# Patient Record
Sex: Male | Born: 1970
Health system: Southern US, Community
[De-identification: ages and names within clinical notes are randomized; demographics above are authoritative.]

## PROBLEM LIST (undated history)

## (undated) DIAGNOSIS — R011 Cardiac murmur, unspecified: Secondary | ICD-10-CM

## (undated) DIAGNOSIS — F410 Panic disorder [episodic paroxysmal anxiety] without agoraphobia: Secondary | ICD-10-CM

## (undated) DIAGNOSIS — R51 Headache: Secondary | ICD-10-CM

## (undated) DIAGNOSIS — C801 Malignant (primary) neoplasm, unspecified: Secondary | ICD-10-CM

## (undated) DIAGNOSIS — R109 Unspecified abdominal pain: Secondary | ICD-10-CM

## (undated) DIAGNOSIS — R519 Headache, unspecified: Secondary | ICD-10-CM

## (undated) DIAGNOSIS — K56609 Unspecified intestinal obstruction, unspecified as to partial versus complete obstruction: Secondary | ICD-10-CM

## (undated) HISTORY — PX: FINGER SURGERY: SHX640

## (undated) HISTORY — PX: TYMPANOSTOMY TUBE PLACEMENT: SHX32

---

## 2005-11-05 ENCOUNTER — Emergency Department: Payer: Self-pay | Admitting: Internal Medicine

## 2006-01-03 ENCOUNTER — Other Ambulatory Visit: Payer: Self-pay

## 2006-01-03 ENCOUNTER — Emergency Department: Payer: Self-pay | Admitting: Emergency Medicine

## 2008-11-16 ENCOUNTER — Emergency Department (HOSPITAL_COMMUNITY): Admission: EM | Admit: 2008-11-16 | Discharge: 2008-11-16 | Payer: Self-pay | Admitting: Emergency Medicine

## 2009-10-11 ENCOUNTER — Emergency Department (HOSPITAL_COMMUNITY): Admission: EM | Admit: 2009-10-11 | Discharge: 2009-10-11 | Payer: Self-pay | Admitting: Emergency Medicine

## 2014-11-13 ENCOUNTER — Emergency Department (HOSPITAL_COMMUNITY)
Admission: EM | Admit: 2014-11-13 | Discharge: 2014-11-13 | Disposition: A | Discharge status: Home / Self Care | Payer: Self-pay | Attending: Emergency Medicine | Admitting: Emergency Medicine

## 2014-11-13 ENCOUNTER — Encounter (HOSPITAL_COMMUNITY): Payer: Self-pay | Admitting: Emergency Medicine

## 2014-11-13 DIAGNOSIS — R112 Nausea with vomiting, unspecified: Secondary | ICD-10-CM

## 2014-11-13 DIAGNOSIS — Z720 Tobacco use: Secondary | ICD-10-CM | POA: Insufficient documentation

## 2014-11-13 DIAGNOSIS — R109 Unspecified abdominal pain: Secondary | ICD-10-CM

## 2014-11-13 DIAGNOSIS — K529 Noninfective gastroenteritis and colitis, unspecified: Secondary | ICD-10-CM | POA: Insufficient documentation

## 2014-11-13 DIAGNOSIS — R61 Generalized hyperhidrosis: Secondary | ICD-10-CM | POA: Insufficient documentation

## 2014-11-13 LAB — COMPREHENSIVE METABOLIC PANEL WITH GFR
ALT: 21 U/L (ref 17–63)
AST: 25 U/L (ref 15–41)
Albumin: 4.5 g/dL (ref 3.5–5.0)
Alkaline Phosphatase: 70 U/L (ref 38–126)
Anion gap: 13 (ref 5–15)
BUN: 20 mg/dL (ref 6–20)
CO2: 25 mmol/L (ref 22–32)
Calcium: 9.4 mg/dL (ref 8.9–10.3)
Chloride: 102 mmol/L (ref 101–111)
Creatinine, Ser: 1.1 mg/dL (ref 0.61–1.24)
GFR calc Af Amer: >60 mL/min
GFR calc non Af Amer: >60 mL/min
Glucose, Bld: 105 mg/dL — ABNORMAL HIGH (ref 65–99)
Potassium: 3.7 mmol/L (ref 3.5–5.1)
Sodium: 140 mmol/L (ref 135–145)
Total Bilirubin: 1 mg/dL (ref 0.3–1.2)
Total Protein: 7.4 g/dL (ref 6.5–8.1)

## 2014-11-13 LAB — URINE MICROSCOPIC-ADD ON

## 2014-11-13 LAB — CBC WITH DIFFERENTIAL/PLATELET
Basophils Absolute: 0 K/uL (ref 0.0–0.1)
Basophils Relative: 0 % (ref 0–1)
Eosinophils Absolute: 0.1 K/uL (ref 0.0–0.7)
Eosinophils Relative: 1 % (ref 0–5)
HCT: 52 % (ref 39.0–52.0)
Hemoglobin: 18.2 g/dL — ABNORMAL HIGH (ref 13.0–17.0)
Lymphocytes Relative: 18 % (ref 12–46)
Lymphs Abs: 1.4 K/uL (ref 0.7–4.0)
MCH: 34.1 pg — ABNORMAL HIGH (ref 26.0–34.0)
MCHC: 35 g/dL (ref 30.0–36.0)
MCV: 97.6 fL (ref 78.0–100.0)
Monocytes Absolute: 0.8 K/uL (ref 0.1–1.0)
Monocytes Relative: 10 % (ref 3–12)
Neutro Abs: 5.8 K/uL (ref 1.7–7.7)
Neutrophils Relative %: 71 % (ref 43–77)
Platelets: 267 K/uL (ref 150–400)
RBC: 5.33 MIL/uL (ref 4.22–5.81)
RDW: 13.2 % (ref 11.5–15.5)
WBC: 8.1 K/uL (ref 4.0–10.5)

## 2014-11-13 LAB — URINALYSIS, ROUTINE W REFLEX MICROSCOPIC
Bilirubin Urine: NEGATIVE
GLUCOSE, UA: NEGATIVE mg/dL
HGB URINE DIPSTICK: NEGATIVE
Ketones, ur: NEGATIVE mg/dL
NITRITE: NEGATIVE
PH: 7.5 (ref 5.0–8.0)
PROTEIN: NEGATIVE mg/dL
SPECIFIC GRAVITY, URINE: 1.022 (ref 1.005–1.030)
UROBILINOGEN UA: 1 mg/dL (ref 0.0–1.0)

## 2014-11-13 LAB — TROPONIN I: Troponin I: <0.03 ng/mL (ref <0.031)

## 2014-11-13 LAB — LIPASE, BLOOD: Lipase: 20 U/L — ABNORMAL LOW (ref 22–51)

## 2014-11-13 MED ORDER — GI COCKTAIL ~~LOC~~
30.0000 mL | Freq: Once | ORAL | Status: AC
  Administered 2014-11-13: 30 mL via ORAL
  Filled 2014-11-13: qty 30

## 2014-11-13 MED ORDER — FAMOTIDINE 20 MG PO TABS
20.0000 mg | ORAL_TABLET | Freq: Once | ORAL | Status: AC
  Administered 2014-11-13: 20 mg via ORAL
  Filled 2014-11-13: qty 1

## 2014-11-13 MED ORDER — ONDANSETRON 8 MG PO TBDP
8.0000 mg | ORAL_TABLET | Freq: Once | ORAL | Status: AC
Start: 2014-11-13 — End: 2014-11-13
  Administered 2014-11-13: 8 mg via ORAL
  Filled 2014-11-13: qty 1

## 2014-11-13 NOTE — Discharge Instructions (Signed)
It was our pleasure to provide your ER care today - we hope that you feel better.  Rest. Drink plenty of fluids.  Follow up with primary care doctor, or here, for recheck in the next 1-2 days if symptoms fail to improve/resolve.  Return to ER right away if worse, new symptoms, persistent vomiting, worsening or severe abdominal pain, high fevers, other concern.     Nausea and Vomiting Nausea is a sick feeling that often comes before throwing up (vomiting). Vomiting is a reflex where stomach contents come out of your mouth. Vomiting can cause severe loss of body fluids (dehydration). Children and elderly adults can become dehydrated quickly, especially if they also have diarrhea. Nausea and vomiting are symptoms of a condition or disease. It is important to find the cause of your symptoms. CAUSES   Direct irritation of the stomach lining. This irritation can result from increased acid production (gastroesophageal reflux disease), infection, food poisoning, taking certain medicines (such as nonsteroidal anti-inflammatory drugs), alcohol use, or tobacco use.  Signals from the brain.These signals could be caused by a headache, heat exposure, an inner ear disturbance, increased pressure in the brain from injury, infection, a tumor, or a concussion, pain, emotional stimulus, or metabolic problems.  An obstruction in the gastrointestinal tract (bowel obstruction).  Illnesses such as diabetes, hepatitis, gallbladder problems, appendicitis, kidney problems, cancer, sepsis, atypical symptoms of a heart attack, or eating disorders.  Medical treatments such as chemotherapy and radiation.  Receiving medicine that makes you sleep (general anesthetic) during surgery. DIAGNOSIS Your caregiver may ask for tests to be done if the problems do not improve after a few days. Tests may also be done if symptoms are severe or if the reason for the nausea and vomiting is not clear. Tests may include:  Urine  tests.  Blood tests.  Stool tests.  Cultures (to look for evidence of infection).  X-rays or other imaging studies. Test results can help your caregiver make decisions about treatment or the need for additional tests. TREATMENT You need to stay well hydrated. Drink frequently but in small amounts.You may wish to drink water, sports drinks, clear broth, or eat frozen ice pops or gelatin dessert to help stay hydrated.When you eat, eating slowly may help prevent nausea.There are also some antinausea medicines that may help prevent nausea. HOME CARE INSTRUCTIONS   Take all medicine as directed by your caregiver.  If you do not have an appetite, do not force yourself to eat. However, you must continue to drink fluids.  If you have an appetite, eat a normal diet unless your caregiver tells you differently.  Eat a variety of complex carbohydrates (rice, wheat, potatoes, bread), lean meats, yogurt, fruits, and vegetables.  Avoid high-fat foods because they are more difficult to digest.  Drink enough water and fluids to keep your urine clear or pale yellow.  If you are dehydrated, ask your caregiver for specific rehydration instructions. Signs of dehydration may include:  Severe thirst.  Dry lips and mouth.  Dizziness.  Dark urine.  Decreasing urine frequency and amount.  Confusion.  Rapid breathing or pulse. SEEK IMMEDIATE MEDICAL CARE IF:   You have blood or brown flecks (like coffee grounds) in your vomit.  You have black or bloody stools.  You have a severe headache or stiff neck.  You are confused.  You have severe abdominal pain.  You have chest pain or trouble breathing.  You do not urinate at least once every 8 hours.  You develop  cold or clammy skin.  You continue to vomit for longer than 24 to 48 hours.  You have a fever. MAKE SURE YOU:   Understand these instructions.  Will watch your condition.  Will get help right away if you are not doing  well or get worse. Document Released: 05/07/2005 Document Revised: 07/30/2011 Document Reviewed: 10/04/2010 [facility] Patient Information 2015 [place], [place]. This information is not intended to replace advice given to you by your health care provider. Make sure you discuss any questions you have with your health care provider.      Abdominal Pain Many things can cause abdominal pain. Usually, abdominal pain is not caused by a disease and will improve without treatment. It can often be observed and treated at home. Your health care provider will do a physical exam and possibly order blood tests and X-rays to help determine the seriousness of your pain. However, in many cases, more time must pass before a clear cause of the pain can be found. Before that point, your health care provider may not know if you need more testing or further treatment. HOME CARE INSTRUCTIONS  Monitor your abdominal pain for any changes. The following actions may help to alleviate any discomfort you are experiencing:  Only take over-the-counter or prescription medicines as directed by your health care provider.  Do not take laxatives unless directed to do so by your health care provider.  Try a clear liquid diet (broth, tea, or water) as directed by your health care provider. Slowly move to a bland diet as tolerated. SEEK MEDICAL CARE IF:  You have unexplained abdominal pain.  You have abdominal pain associated with nausea or diarrhea.  You have pain when you urinate or have a bowel movement.  You experience abdominal pain that wakes you in the night.  You have abdominal pain that is worsened or improved by eating food.  You have abdominal pain that is worsened with eating fatty foods.  You have a fever. SEEK IMMEDIATE MEDICAL CARE IF:   Your pain does not go away within 2 hours.  You keep throwing up (vomiting).  Your pain is felt only in portions of the abdomen, such as the right side or the left  lower portion of the abdomen.  You pass bloody or black tarry stools. MAKE SURE YOU:  Understand these instructions.   Will watch your condition.   Will get help right away if you are not doing well or get worse.  Document Released: 02/14/2005 Document Revised: 05/12/2013 Document Reviewed: 01/14/2013 [facility] Patient Information 2015 [place], [place]. This information is not intended to replace advice given to you by your health care provider. Make sure you discuss any questions you have with your health care provider.    Viral Gastroenteritis Viral gastroenteritis is also known as stomach flu. This condition affects the stomach and intestinal tract. It can cause sudden diarrhea and vomiting. The illness typically lasts 3 to 8 days. Most people develop an immune response that eventually gets rid of the virus. While this natural response develops, the virus can make you quite ill. CAUSES  Many different viruses can cause gastroenteritis, such as rotavirus or noroviruses. You can catch one of these viruses by consuming contaminated food or water. You may also catch a virus by sharing utensils or other personal items with an infected person or by touching a contaminated surface. SYMPTOMS  The most common symptoms are diarrhea and vomiting. These problems can cause a severe loss of body fluids (dehydration)  and a body salt (electrolyte) imbalance. Other symptoms may include:  Fever.  Headache.  Fatigue.  Abdominal pain. DIAGNOSIS  Your caregiver can usually diagnose viral gastroenteritis based on your symptoms and a physical exam. A stool sample may also be taken to test for the presence of viruses or other infections. TREATMENT  This illness typically goes away on its own. Treatments are aimed at rehydration. The most serious cases of viral gastroenteritis involve vomiting so severely that you are not able to keep fluids down. In these cases, fluids must be given through an  intravenous line (IV). HOME CARE INSTRUCTIONS   Drink enough fluids to keep your urine clear or pale yellow. Drink small amounts of fluids frequently and increase the amounts as tolerated.  Ask your caregiver for specific rehydration instructions.  Avoid:  Foods high in sugar.  Alcohol.  Carbonated drinks.  Tobacco.  Juice.  Caffeine drinks.  Extremely hot or cold fluids.  Fatty, greasy foods.  Too much intake of anything at one time.  Dairy products until 24 to 48 hours after diarrhea stops.  You may consume probiotics. Probiotics are active cultures of beneficial bacteria. They may lessen the amount and number of diarrheal stools in adults. Probiotics can be found in yogurt with active cultures and in supplements.  Wash your hands well to avoid spreading the virus.  Only take over-the-counter or prescription medicines for pain, discomfort, or fever as directed by your caregiver. Do not give aspirin to children. Antidiarrheal medicines are not recommended.  Ask your caregiver if you should continue to take your regular prescribed and over-the-counter medicines.  Keep all follow-up appointments as directed by your caregiver. SEEK IMMEDIATE MEDICAL CARE IF:   You are unable to keep fluids down.  You do not urinate at least once every 6 to 8 hours.  You develop shortness of breath.  You notice blood in your stool or vomit. This may look like coffee grounds.  You have abdominal pain that increases or is concentrated in one small area (localized).  You have persistent vomiting or diarrhea.  You have a fever.  The patient is a child younger than 3 months, and he or she has a fever.  The patient is a child older than 3 months, and he or she has a fever and persistent symptoms.  The patient is a child older than 3 months, and he or she has a fever and symptoms suddenly get worse.  The patient is a baby, and he or she has no tears when crying. MAKE SURE YOU:    Understand these instructions.  Will watch your condition.  Will get help right away if you are not doing well or get worse. Document Released: 05/07/2005 Document Revised: 07/30/2011 Document Reviewed: 02/21/2011 [facility] Patient Information 2015 [place], [place]. This information is not intended to replace advice given to you by your health care provider. Make sure you discuss any questions you have with your health care provider.

## 2014-11-13 NOTE — ED Notes (Signed)
Per EMS pt from home had catfish last night and again at 0600 today starting vomiting 1 hour ago and has vomited x 5 times with abdominal pain. Pain and tightness to abdomen. BP 130/60, HR 74, resp 20 and 98% O2.

## 2014-11-13 NOTE — ED Notes (Signed)
Pt is refusing iv at this moment.  Will follow up.

## 2014-11-13 NOTE — ED Provider Notes (Signed)
CSN: [id number]     Arrival date & time 11/13/14  1433 History   First MD Initiated Contact with Patient 11/13/14 1516     Chief Complaint  Patient presents with  . Abdominal Pain  . Nausea  . Emesis     (Consider location/radiation/quality/duration/timing/severity/associated sxs/prior Treatment) Patient is a 44 y.o. male presenting with abdominal pain and vomiting. The history is provided by the patient.  Abdominal Pain Associated symptoms: vomiting   Associated symptoms: no chest pain, no chills, no constipation, no cough, no diarrhea, no dysuria, no fever, no shortness of breath and no sore throat   Emesis Associated symptoms: abdominal pain   Associated symptoms: no chills, no diarrhea, no headaches and no sore throat   Patient c/o abd bloating, crampy discomfort, and nv onset a few hours ago. Pt indicates had eaten fried catfish late last night/this AM, and feels may be related. States entire abd felt bloating and then crampy pain moved up to epigastric area. Vomited 4-5 times, not bloody or bilious. Did have normal bm today. No prior abd surgery or hx chronic gi problems. No specific food intolerance. Denies back or flank pain. No g/u c/o. No fever or chills. Denies cp or discomfort. No recent unusual doe or fatigue. w above symptoms, did note diaphoresis. No hx pud, gallstones or pancreatitis.      History reviewed. No pertinent past medical history. History reviewed. No pertinent past surgical history. No family history on file. History  Substance Use Topics  . Smoking status: Current Every Day Smoker  . Smokeless tobacco: Not on file  . Alcohol Use: Yes    Review of Systems  Constitutional: Positive for diaphoresis. Negative for fever and chills.  HENT: Negative for sore throat.   Eyes: Negative for redness.  Respiratory: Negative for cough and shortness of breath.   Cardiovascular: Negative for chest pain, palpitations and leg swelling.  Gastrointestinal: Positive  for vomiting and abdominal pain. Negative for diarrhea and constipation.  Endocrine: Negative for polyuria.  Genitourinary: Negative for dysuria and flank pain.  Musculoskeletal: Negative for back pain and neck pain.  Skin: Negative for rash.  Neurological: Negative for headaches.  Hematological: Does not bruise/bleed easily.  Psychiatric/Behavioral: Negative for confusion.      Allergies  Asa  Home Medications   Prior to Admission medications   Not on File   There were no vitals taken for this visit. Physical Exam  Constitutional: He is oriented to person, place, and time. He appears well-developed and well-nourished. No distress.  HENT:  Mouth/Throat: Oropharynx is clear and moist.  Eyes: Conjunctivae are normal. No scleral icterus.  Neck: Neck supple. No tracheal deviation present. No thyromegaly present.  Cardiovascular: Normal rate, regular rhythm, normal heart sounds and intact distal pulses.  Exam reveals no gallop and no friction rub.   No murmur heard. Pulmonary/Chest: Effort normal and breath sounds normal. No accessory muscle usage. No respiratory distress.  Abdominal: Soft. Bowel sounds are normal. He exhibits no distension and no mass. There is no tenderness. There is no rebound and no guarding.  No incarc hernia.   Genitourinary:  No scrotal or testicular pain or swelling. No cva tenderness.   Musculoskeletal: Normal range of motion.  Neurological: He is alert and oriented to person, place, and time.  Skin: Skin is warm and dry. No rash noted. He is not diaphoretic.  Psychiatric: He has a normal mood and affect.  Nursing note and vitals reviewed.   ED Course  Procedures (including  critical care time) Labs Review  Results for orders placed or performed during the hospital encounter of 11/13/14  CBC with Differential  Result Value Ref Range   WBC 8.1 4.0 - 10.5 K/uL   RBC 5.33 4.22 - 5.81 MIL/uL   Hemoglobin 18.2 (H) 13.0 - 17.0 g/dL   HCT 52.0 39.0 -  52.0 %   MCV 97.6 78.0 - 100.0 fL   MCH 34.1 (H) 26.0 - 34.0 pg   MCHC 35.0 30.0 - 36.0 g/dL   RDW 13.2 11.5 - 15.5 %   Platelets 267 150 - 400 K/uL   Neutrophils Relative % 71 43 - 77 %   Neutro Abs 5.8 1.7 - 7.7 K/uL   Lymphocytes Relative 18 12 - 46 %   Lymphs Abs 1.4 0.7 - 4.0 K/uL   Monocytes Relative 10 3 - 12 %   Monocytes Absolute 0.8 0.1 - 1.0 K/uL   Eosinophils Relative 1 0 - 5 %   Eosinophils Absolute 0.1 0.0 - 0.7 K/uL   Basophils Relative 0 0 - 1 %   Basophils Absolute 0.0 0.0 - 0.1 K/uL  Comprehensive metabolic panel  Result Value Ref Range   Sodium 140 135 - 145 mmol/L   Potassium 3.7 3.5 - 5.1 mmol/L   Chloride 102 101 - 111 mmol/L   CO2 25 22 - 32 mmol/L   Glucose, Bld 105 (H) 65 - 99 mg/dL   BUN 20 6 - 20 mg/dL   Creatinine, Ser 1.10 0.61 - 1.24 mg/dL   Calcium 9.4 8.9 - 10.3 mg/dL   Total Protein 7.4 6.5 - 8.1 g/dL   Albumin 4.5 3.5 - 5.0 g/dL   AST 25 15 - 41 U/L   ALT 21 17 - 63 U/L   Alkaline Phosphatase 70 38 - 126 U/L   Total Bilirubin 1.0 0.3 - 1.2 mg/dL   GFR calc non Af Amer >60 >60 mL/min   GFR calc Af Amer >60 >60 mL/min   Anion gap 13 5 - 15  Lipase, blood  Result Value Ref Range   Lipase 20 (L) 22 - 51 U/L  Urinalysis, Routine w reflex microscopic (not at [facility])  Result Value Ref Range   Color, Urine YELLOW YELLOW   APPearance CLEAR (A) CLEAR   Specific Gravity, Urine 1.022 1.005 - 1.030   pH 7.5 5.0 - 8.0   Glucose, UA NEGATIVE NEGATIVE mg/dL   Hgb urine dipstick NEGATIVE NEGATIVE   Bilirubin Urine NEGATIVE NEGATIVE   Ketones, ur NEGATIVE NEGATIVE mg/dL   Protein, ur NEGATIVE NEGATIVE mg/dL   Urobilinogen, UA 1.0 0.0 - 1.0 mg/dL   Nitrite NEGATIVE NEGATIVE   Leukocytes, UA TRACE (A) NEGATIVE  Troponin I  Result Value Ref Range   Troponin I <0.03 <0.031 ng/mL  Urine microscopic-add on  Result Value Ref Range   Squamous Epithelial / LPF RARE RARE   WBC, UA 0-2 <3 WBC/hpf       EKG Interpretation   Date/Time:  Saturday November 13 2014 16:20:26 EDT Ventricular Rate:  66 PR Interval:  197 QRS Duration: 82 QT Interval:  395 QTC Calculation: 414 R Axis:   68 Text Interpretation:  Sinus rhythm ST elev, probable normal early repol  pattern No significant change since last tracing Confirmed by Ashok Cordia  MD,  Lennette Bihari ([id number]) on 11/13/2014 4:24:51 PM      MDM   Iv ns. Labs.  pepcid and gi cocktail for symptom relief.  Reviewed nursing notes and prior charts for  additional history.   Recheck symptoms resolved. No nvd. Tolerating po fluids. No cp or discomfort.   Pt requests d/c to home, states asymptomatic. abd soft nt.  Pt currently appears stable for d/c.   Return precautions provided.      Lajean Saver, MD 11/13/14 [phone]

## 2016-05-21 DEATH — deceased

## 2017-01-19 ENCOUNTER — Encounter (HOSPITAL_COMMUNITY): Payer: Self-pay | Admitting: Nurse Practitioner

## 2017-01-19 ENCOUNTER — Emergency Department (HOSPITAL_COMMUNITY): Payer: BLUE CROSS/BLUE SHIELD

## 2017-01-19 ENCOUNTER — Emergency Department (HOSPITAL_COMMUNITY)
Admission: EM | Admit: 2017-01-19 | Discharge: 2017-01-19 | Disposition: A | Discharge status: Home / Self Care | Payer: BLUE CROSS/BLUE SHIELD | Attending: Emergency Medicine | Admitting: Emergency Medicine

## 2017-01-19 DIAGNOSIS — R1030 Lower abdominal pain, unspecified: Secondary | ICD-10-CM | POA: Diagnosis present

## 2017-01-19 DIAGNOSIS — K5000 Crohn's disease of small intestine without complications: Secondary | ICD-10-CM | POA: Insufficient documentation

## 2017-01-19 DIAGNOSIS — F17200 Nicotine dependence, unspecified, uncomplicated: Secondary | ICD-10-CM | POA: Insufficient documentation

## 2017-01-19 LAB — TROPONIN I: Troponin I: <0.03 ng/mL (ref <0.03)

## 2017-01-19 LAB — CBC WITH DIFFERENTIAL/PLATELET
Basophils Absolute: 0 10*3/uL (ref 0.0–0.1)
Basophils Relative: 0 %
Eosinophils Absolute: 0 10*3/uL (ref 0.0–0.7)
Eosinophils Relative: 0 %
HCT: 47.6 % (ref 39.0–52.0)
HEMOGLOBIN: 16.9 g/dL (ref 13.0–17.0)
LYMPHS ABS: 1 10*3/uL (ref 0.7–4.0)
LYMPHS PCT: 6 %
MCH: 32.9 pg (ref 26.0–34.0)
MCHC: 35.5 g/dL (ref 30.0–36.0)
MCV: 92.8 fL (ref 78.0–100.0)
MONOS PCT: 10 %
Monocytes Absolute: 1.6 10*3/uL — ABNORMAL HIGH (ref 0.1–1.0)
Neutro Abs: 13.3 10*3/uL — ABNORMAL HIGH (ref 1.7–7.7)
Neutrophils Relative %: 84 %
Platelets: 307 10*3/uL (ref 150–400)
RBC: 5.13 MIL/uL (ref 4.22–5.81)
RDW: 12.3 % (ref 11.5–15.5)
WBC: 16 10*3/uL — AB (ref 4.0–10.5)

## 2017-01-19 LAB — COMPREHENSIVE METABOLIC PANEL
ALT: 14 U/L — AB (ref 17–63)
ANION GAP: 14 (ref 5–15)
AST: 19 U/L (ref 15–41)
Albumin: 3.6 g/dL (ref 3.5–5.0)
Alkaline Phosphatase: 79 U/L (ref 38–126)
BUN: 15 mg/dL (ref 6–20)
CHLORIDE: 98 mmol/L — AB (ref 101–111)
CO2: 27 mmol/L (ref 22–32)
CREATININE: 0.99 mg/dL (ref 0.61–1.24)
Calcium: 9.6 mg/dL (ref 8.9–10.3)
Glucose, Bld: 103 mg/dL — ABNORMAL HIGH (ref 65–99)
POTASSIUM: 4.4 mmol/L (ref 3.5–5.1)
SODIUM: 139 mmol/L (ref 135–145)
Total Bilirubin: 2.1 mg/dL — ABNORMAL HIGH (ref 0.3–1.2)
Total Protein: 8 g/dL (ref 6.5–8.1)

## 2017-01-19 LAB — LIPASE, BLOOD: LIPASE: 19 U/L (ref 11–51)

## 2017-01-19 MED ORDER — IOPAMIDOL (ISOVUE-300) INJECTION 61%
INTRAVENOUS | Status: AC
  Administered 2017-01-19: 100 mL
  Filled 2017-01-19: qty 100

## 2017-01-19 MED ORDER — CIPROFLOXACIN IN D5W 400 MG/200ML IV SOLN
400.0000 mg | Freq: Once | INTRAVENOUS | Status: AC
  Administered 2017-01-19: 400 mg via INTRAVENOUS
  Filled 2017-01-19: qty 200

## 2017-01-19 MED ORDER — SODIUM CHLORIDE 0.9 % IV BOLUS (SEPSIS)
1000.0000 mL | Freq: Once | INTRAVENOUS | Status: AC
  Administered 2017-01-19: 1000 mL via INTRAVENOUS

## 2017-01-19 MED ORDER — HYDROCODONE-ACETAMINOPHEN 5-325 MG PO TABS: 1.0000 | ORAL_TABLET | ORAL | 0 refills | Status: DC | PRN

## 2017-01-19 MED ORDER — METRONIDAZOLE IN NACL 5-0.79 MG/ML-% IV SOLN
500.0000 mg | Freq: Once | INTRAVENOUS | Status: AC
  Administered 2017-01-19: 500 mg via INTRAVENOUS
  Filled 2017-01-19: qty 100

## 2017-01-19 MED ORDER — HYDROCODONE-ACETAMINOPHEN 5-325 MG PO TABS
2.0000 | ORAL_TABLET | Freq: Once | ORAL | Status: AC
  Administered 2017-01-19: 2 via ORAL
  Filled 2017-01-19: qty 2

## 2017-01-19 MED ORDER — ONDANSETRON 4 MG PO TBDP: 4.0000 mg | ORAL_TABLET | Freq: Three times a day (TID) | ORAL | 0 refills | Status: DC | PRN

## 2017-01-19 MED ORDER — METRONIDAZOLE 500 MG PO TABS: 500.0000 mg | ORAL_TABLET | Freq: Two times a day (BID) | ORAL | 0 refills | Status: DC

## 2017-01-19 MED ORDER — MORPHINE SULFATE (PF) 4 MG/ML IV SOLN
8.0000 mg | Freq: Once | INTRAVENOUS | Status: AC
  Administered 2017-01-19: 8 mg via INTRAVENOUS
  Filled 2017-01-19: qty 2

## 2017-01-19 MED ORDER — CIPROFLOXACIN HCL 250 MG PO TABS: 250.0000 mg | ORAL_TABLET | Freq: Two times a day (BID) | ORAL | 0 refills | Status: DC

## 2017-01-19 NOTE — Discharge Instructions (Signed)
Return to the ER immediately if you develop fever, vomiting, worsening or continuing abdominal pain. Otherwise follow up with a primary care doctor and GI specialist.

## 2017-01-19 NOTE — ED Notes (Signed)
Pt unable to provide urine specimen. Urinal by bedside.

## 2017-01-19 NOTE — ED Notes (Signed)
Patient has urinal at bedside not able to provide speciman at this time

## 2017-01-19 NOTE — ED Notes (Signed)
Bed: YW03 Expected date: 01/19/17 Expected time: 5:17 PM Means of arrival: Ambulance Comments: N/V

## 2017-01-19 NOTE — ED Notes (Signed)
Patient transported to CT 

## 2017-01-19 NOTE — ED Provider Notes (Signed)
[place] DEPT Provider Note   CSN: [id number] Arrival date & time: 01/19/17  1709     History   Chief Complaint Chief Complaint  Patient presents with  . Abdominal Pain    HPI Derek Lloyd is a 46 y.o. male.  HPI  45 year old male presents with abdominal pain that started 2 days ago. Has been having vomiting since yesterday. He has not had a bowel movement since his abdominal pain started. He states his abdomen feels stented but when he looks at it is not distended. No hematemesis. He has tried Rolaids without relief as well as Pepto-Bismol, which caused him to vomit. He tried magnesium citrate because he has had no bowel movement in about 30 minutes later the pain intensified and so he called EMS. He states that he typically has a lot of flatulence and he's had very minimal since this pain started. He has gotten some pain at the bottom of his scrotum and he urinates during this time. The pain sometimes radiates up into his chest. The lower abdomen is where it hurts the worse and feels like a burning sensation. Over the past few hours he has felt short of breath. Pain is severe.  History reviewed. No pertinent past medical history.  There are no active problems to display for this patient.   History reviewed. No pertinent surgical history.     Home Medications    Prior to Admission medications   Medication Sig Start Date End Date Taking? Authorizing Provider  Ca Carbonate-Mag Hydroxide 550-110 MG CHEW Chew 1 tablet by mouth daily as needed (GI Upset).   Yes [provider]  magnesium citrate ([employer] CITRATE OF MAGNESIA) SOLN Take 1 Bottle by mouth once.   Yes [provider]  ciprofloxacin (CIPRO) 250 MG tablet Take 1 tablet (250 mg total) by mouth every 12 (twelve) hours. 01/19/17   Sherwood Gambler, MD  HYDROcodone-acetaminophen (NORCO) 5-325 MG tablet Take 1-2 tablets by mouth every 4 (four) hours as needed for severe pain. 01/19/17   Sherwood Gambler, MD    metroNIDAZOLE (FLAGYL) 500 MG tablet Take 1 tablet (500 mg total) by mouth 2 (two) times daily. One po bid x 7 days 01/19/17   Sherwood Gambler, MD  ondansetron (ZOFRAN ODT) 4 MG disintegrating tablet Take 1 tablet (4 mg total) by mouth every 8 (eight) hours as needed for nausea or vomiting. 01/19/17   Sherwood Gambler, MD    Family History No family history on file.  Social History Social History  Substance Use Topics  . Smoking status: Current Every Day Smoker  . Smokeless tobacco: Not on file  . Alcohol use Yes     Allergies   Asa [aspirin]   Review of Systems Review of Systems  Constitutional: Negative for fever.  Respiratory: Positive for shortness of breath.   Cardiovascular: Positive for chest pain.  Gastrointestinal: Positive for abdominal pain, constipation, nausea and vomiting. Negative for diarrhea.  Genitourinary: Positive for dysuria.  All other systems reviewed and are negative.    Physical Exam Updated Vital Signs BP 118/81   Pulse (!) 102   Temp 99.3 F (37.4 C) (Oral)   Resp 19   Ht 5' 10"  (1.778 m)   Wt 61.2 kg (135 lb)   SpO2 98%   BMI 19.37 kg/m   Physical Exam  Constitutional: He is oriented to person, place, and time. He appears well-developed and well-nourished.  HENT:  Head: Normocephalic and atraumatic.  Right Ear: External ear normal.  Left Ear:  External ear normal.  Nose: Nose normal.  Eyes: Right eye exhibits no discharge. Left eye exhibits no discharge.  Neck: Neck supple.  Cardiovascular: Normal rate, regular rhythm and normal heart sounds.   HR~100  Pulmonary/Chest: Effort normal and breath sounds normal.  Abdominal: Soft. There is generalized tenderness. There is guarding.  Genitourinary: Penis normal. Right testis shows no tenderness. Left testis shows no tenderness. Circumcised.  Genitourinary Comments: Point tenderness at inferior aspect of his scrotum but his testicles are not swollen or tender. No skin changes or swelling to  scrotum.  Musculoskeletal: He exhibits no edema.  Neurological: He is alert and oriented to person, place, and time.  Skin: Skin is warm and dry. He is not diaphoretic.  Nursing note and vitals reviewed.    ED Treatments / Results  Labs (all labs ordered are listed, but only abnormal results are displayed) Labs Reviewed  COMPREHENSIVE METABOLIC PANEL - Abnormal; Notable for the following:       Result Value   Chloride 98 (*)    Glucose, Bld 103 (*)    ALT 14 (*)    Total Bilirubin 2.1 (*)    All other components within normal limits  CBC WITH DIFFERENTIAL/PLATELET - Abnormal; Notable for the following:    WBC 16.0 (*)    Neutro Abs 13.3 (*)    Monocytes Absolute 1.6 (*)    All other components within normal limits  LIPASE, BLOOD  TROPONIN I  URINALYSIS, ROUTINE W REFLEX MICROSCOPIC    EKG  EKG Interpretation  Date/Time:  Saturday January 19 2017 18:23:48 EDT Ventricular Rate:  102 PR Interval:    QRS Duration: 81 QT Interval:  330 QTC Calculation: 430 R Axis:   68 Text Interpretation:  Sinus tachycardia RAE, consider biatrial enlargement ST elev, probable normal early repol pattern rate is faster, otherwise no significant change compared to 2016 Confirmed by Sherwood Gambler [phone]) on 01/19/2017 6:55:19 PM       Radiology Dg Chest 2 View  Result Date: 01/19/2017 CLINICAL DATA:  Upper abdomen pressure EXAM: CHEST  2 VIEW COMPARISON:  January 03, 2006 FINDINGS: The heart size and mediastinal contours are within normal limits. Both lungs are clear. The visualized skeletal structures are unremarkable. IMPRESSION: No active cardiopulmonary disease. Electronically Signed   By: Abelardo Diesel M.D.   On: 01/19/2017 17:54   Ct Abdomen Pelvis W Contrast  Result Date: 01/19/2017 CLINICAL DATA:  Abdominal pain accompanied with nausea, vomiting and constipation.No acute abnormality. EXAM: CT ABDOMEN AND PELVIS WITH CONTRAST TECHNIQUE: Multidetector CT imaging of the abdomen and  pelvis was performed using the standard protocol following bolus administration of intravenous contrast. CONTRAST:  100 cc Isovue-300 COMPARISON:  None. FINDINGS: Lower chest: No acute abnormality. Normal size heart. No pericardial effusion. Bibasilar atelectasis. Hepatobiliary: No focal liver abnormality is seen. No gallstones, gallbladder wall thickening, or biliary dilatation. Pancreas: Unremarkable. No pancreatic ductal dilatation or surrounding inflammatory changes. Spleen: Normal in size without focal abnormality. Adrenals/Urinary Tract: Adrenal glands are unremarkable. Kidneys are normal, without renal calculi, focal lesion, or hydronephrosis. Bladder is unremarkable. Stomach/Bowel: Moderate transmural thickening of distal and terminal ileum with more proximal fluid-filled distention of jejunal and ileal loops of small intestine. Diarrheal disease suspected within the colon with liquid stool present to the level of the rectum. Abnormal thickening and distention of the appendix to 13 mm in diameter is also noted. Differential possibilities may include sympathetic thickening of the appendix due to adjacent ileitis although a concomitant appendicitis cannot entirely  excluded or sympathetic thickening of the ileum due to an appendicitis. Vascular/Lymphatic: Aortoiliac atherosclerosis. No lymphadenopathy by CT size criteria. Reproductive: Central zone calcifications are noted within prostate which is mildly enlarged. Partially imaged small hydroceles within the included scrotum. Other: No abdominal wall hernia or abnormality. No abdominopelvic ascites. Musculoskeletal: No acute or significant osseous findings. IMPRESSION: 1. Segmental moderate mural thickening of distal and terminal ileum consistent with an ileitis. No bowel obstruction is noted. More proximal fluid-filled distention of nonthickened jejunum and proximal ileum are identified which may reflect an enteritis. Liquid stool is seen within the colon  consistent with diarrheal disease. 2. Confounding findings of a distended thickened appendix, measuring up to 12 mm also in the right lower quadrant adjacent to the inflamed distal ileum. Findings could be due to sympathetic thickening of the appendix due to adjacent ileitis or vice versa, favor an ileitis with sympathetic thickening of the appendix given the more segmental moderate thickened appearance of the ileum. 3. Small hydroceles are partially imaged of the included scrotum. Electronically Signed   By: Ashley Royalty M.D.   On: 01/19/2017 20:45    Procedures Procedures (including critical care time)  Medications Ordered in ED Medications  sodium chloride 0.9 % bolus 1,000 mL (0 mLs Intravenous Stopped 01/19/17 1903)  morphine 4 MG/ML injection 8 mg (8 mg Intravenous Given 01/19/17 1803)  iopamidol (ISOVUE-300) 61 % injection (100 mLs  Contrast Given 01/19/17 1939)  ciprofloxacin (CIPRO) IVPB 400 mg (0 mg Intravenous Stopped 01/19/17 2249)  metroNIDAZOLE (FLAGYL) IVPB 500 mg (0 mg Intravenous Stopped 01/19/17 2249)  sodium chloride 0.9 % bolus 1,000 mL (0 mLs Intravenous Stopped 01/19/17 2249)  HYDROcodone-acetaminophen (NORCO/VICODIN) 5-325 MG per tablet 2 tablet (2 tablets Oral Given 01/19/17 2148)     Initial Impression / Assessment and Plan / ED Course  I have reviewed the triage vital signs and the nursing notes.  Pertinent labs & imaging results that were available during my care of the patient were reviewed by me and considered in my medical decision making (see chart for details).     CT shows ileitis. Pain is much better. HR improved. I don't think this represents appendicitis based on CT findings. No abscess, perforation. I recommended overnight obs with fluids and antibiotics. He declines, states he has to go back to his motel or he will lose his stuff. Given he appears well now, I think this is OK, and will d/c with antibiotics and pain control. No vomiting in ED. However I also discussed  strict return precautions and he understands. No clear cause for his mild scrotum pain, no swelling or infection.   Final Clinical Impressions(s) / ED Diagnoses   Final diagnoses:  Terminal ileitis without complication [facility])    New Prescriptions Discharge Medication List as of 01/19/2017 10:26 PM    START taking these medications   Details  ciprofloxacin (CIPRO) 250 MG tablet Take 1 tablet (250 mg total) by mouth every 12 (twelve) hours., Starting Sat 01/19/2017, Print    HYDROcodone-acetaminophen (NORCO) 5-325 MG tablet Take 1-2 tablets by mouth every 4 (four) hours as needed for severe pain., Starting Sat 01/19/2017, Print    metroNIDAZOLE (FLAGYL) 500 MG tablet Take 1 tablet (500 mg total) by mouth 2 (two) times daily. One po bid x 7 days, Starting Sat 01/19/2017, Print    ondansetron (ZOFRAN ODT) 4 MG disintegrating tablet Take 1 tablet (4 mg total) by mouth every 8 (eight) hours as needed for nausea or vomiting., Starting Sat 01/19/2017,  Print         Sherwood Gambler, MD 01/20/17 [phone]

## 2017-01-19 NOTE — ED Triage Notes (Signed)
Pt is c/o abdominal pain accompanied by N/V/Constipation. Reports taking OTC stool softer without getting relief.

## 2017-01-24 ENCOUNTER — Other Ambulatory Visit: Payer: Self-pay | Admitting: General Surgery

## 2017-01-24 ENCOUNTER — Emergency Department (HOSPITAL_COMMUNITY): Payer: BLUE CROSS/BLUE SHIELD

## 2017-01-24 ENCOUNTER — Emergency Department (HOSPITAL_COMMUNITY)
Admission: EM | Admit: 2017-01-24 | Discharge: 2017-01-24 | Discharge status: Against Medical Advice | Payer: BLUE CROSS/BLUE SHIELD | Attending: Emergency Medicine | Admitting: Emergency Medicine

## 2017-01-24 ENCOUNTER — Encounter (HOSPITAL_COMMUNITY): Payer: Self-pay | Admitting: Emergency Medicine

## 2017-01-24 DIAGNOSIS — Z79899 Other long term (current) drug therapy: Secondary | ICD-10-CM | POA: Diagnosis not present

## 2017-01-24 DIAGNOSIS — R1031 Right lower quadrant pain: Secondary | ICD-10-CM | POA: Insufficient documentation

## 2017-01-24 DIAGNOSIS — R1030 Lower abdominal pain, unspecified: Secondary | ICD-10-CM

## 2017-01-24 DIAGNOSIS — F17200 Nicotine dependence, unspecified, uncomplicated: Secondary | ICD-10-CM | POA: Diagnosis not present

## 2017-01-24 LAB — URINALYSIS, ROUTINE W REFLEX MICROSCOPIC
Bacteria, UA: NONE SEEN
Bilirubin Urine: NEGATIVE
Glucose, UA: NEGATIVE mg/dL
Hgb urine dipstick: NEGATIVE
KETONES UR: NEGATIVE mg/dL
Nitrite: NEGATIVE
PROTEIN: NEGATIVE mg/dL
RBC / HPF: NONE SEEN RBC/hpf (ref 0–5)
Specific Gravity, Urine: 1.026 (ref 1.005–1.030)
pH: 5 (ref 5.0–8.0)

## 2017-01-24 LAB — COMPREHENSIVE METABOLIC PANEL
ALBUMIN: 3.2 g/dL — AB (ref 3.5–5.0)
ALK PHOS: 81 U/L (ref 38–126)
ALT: 21 U/L (ref 17–63)
AST: 33 U/L (ref 15–41)
Anion gap: 12 (ref 5–15)
BUN: 8 mg/dL (ref 6–20)
CHLORIDE: 95 mmol/L — AB (ref 101–111)
CO2: 29 mmol/L (ref 22–32)
CREATININE: 0.91 mg/dL (ref 0.61–1.24)
Calcium: 9.1 mg/dL (ref 8.9–10.3)
GFR calc Af Amer: >60 mL/min (ref >60)
GFR calc non Af Amer: >60 mL/min (ref >60)
GLUCOSE: 102 mg/dL — AB (ref 65–99)
Potassium: 3.3 mmol/L — ABNORMAL LOW (ref 3.5–5.1)
SODIUM: 136 mmol/L (ref 135–145)
Total Bilirubin: 0.5 mg/dL (ref 0.3–1.2)
Total Protein: 7 g/dL (ref 6.5–8.1)

## 2017-01-24 LAB — CBC
HCT: 44.4 % (ref 39.0–52.0)
HEMOGLOBIN: 15.2 g/dL (ref 13.0–17.0)
MCH: 32.8 pg (ref 26.0–34.0)
MCHC: 34.2 g/dL (ref 30.0–36.0)
MCV: 95.9 fL (ref 78.0–100.0)
Platelets: 522 10*3/uL — ABNORMAL HIGH (ref 150–400)
RBC: 4.63 MIL/uL (ref 4.22–5.81)
RDW: 12.9 % (ref 11.5–15.5)
WBC: 13.7 10*3/uL — ABNORMAL HIGH (ref 4.0–10.5)

## 2017-01-24 LAB — LIPASE, BLOOD: LIPASE: 17 U/L (ref 11–51)

## 2017-01-24 MED ORDER — PIPERACILLIN-TAZOBACTAM 3.375 G IVPB 30 MIN
3.3750 g | Freq: Once | INTRAVENOUS | Status: AC
  Administered 2017-01-24: 3.375 g via INTRAVENOUS
  Filled 2017-01-24: qty 50

## 2017-01-24 MED ORDER — IOPAMIDOL (ISOVUE-300) INJECTION 61%
INTRAVENOUS | Status: AC
  Filled 2017-01-24: qty 100

## 2017-01-24 MED ORDER — ONDANSETRON HCL 4 MG/2ML IJ SOLN
4.0000 mg | Freq: Once | INTRAMUSCULAR | Status: AC
  Administered 2017-01-24: 4 mg via INTRAVENOUS
  Filled 2017-01-24: qty 2

## 2017-01-24 MED ORDER — IOPAMIDOL (ISOVUE-300) INJECTION 61%
100.0000 mL | Freq: Once | INTRAVENOUS | Status: AC | PRN
  Administered 2017-01-24: 100 mL via INTRAVENOUS

## 2017-01-24 MED ORDER — SODIUM CHLORIDE 0.9 % IV BOLUS (SEPSIS)
1000.0000 mL | Freq: Once | INTRAVENOUS | Status: AC
Start: 2017-01-24 — End: 2017-01-24
  Administered 2017-01-24: 1000 mL via INTRAVENOUS

## 2017-01-24 MED ORDER — SODIUM CHLORIDE 0.9 % IV BOLUS (SEPSIS)
1000.0000 mL | Freq: Once | INTRAVENOUS | Status: AC
  Administered 2017-01-24: 1000 mL via INTRAVENOUS

## 2017-01-24 MED ORDER — MORPHINE SULFATE (PF) 4 MG/ML IV SOLN
4.0000 mg | Freq: Once | INTRAVENOUS | Status: AC
  Administered 2017-01-24: 4 mg via INTRAVENOUS
  Filled 2017-01-24: qty 1

## 2017-01-24 NOTE — ED Notes (Signed)
ED PA at bedside

## 2017-01-24 NOTE — ED Notes (Signed)
Pt aware that a sample is needed.  Pt given urinal and sts he will push call bell when he is finished.

## 2017-01-24 NOTE — Discharge Instructions (Addendum)
I encouraged you to stay in the hospital today. We discussed the risks involved in leaving. As you discussed with the surgeon if you are to leave against medical advice, I recommend that you return tomorrow for further workup.

## 2017-01-24 NOTE — ED Triage Notes (Signed)
Pt reports he is following up for abd pain for the past 4 weeks. Was told he has a swollen appendix on Saturday. Pt reports he needed to follow up in few days. Pt ran out of pain medication, and pain continues.

## 2017-01-24 NOTE — ED Notes (Signed)
Pt called for blood draw, no response from lobby

## 2017-01-24 NOTE — ED Provider Notes (Signed)
[facility] DEPT Provider Note   CSN: [id number] Arrival date & time: 01/24/17  1252     History   Chief Complaint Chief Complaint  Patient presents with  . Abdominal Pain    HPI Derek Lloyd is a 46 y.o. male who presents to the emergency department today for continued abdominal pain. Emesis and for "follow-up". Patient states that he does not have PCP so he returned here for follow up after recent visit. Patient seen here 01/19/17 and diagnosed with ileitis. Patient refused overnight observation and was sent home with nausea medication, flagyl, cipro and pain control.   Patient reports he was doing well since d/c but is having continued pain. He recently ran out of pain and nausea medication. However he notes today when he awoke he felt like his abdomen was distended and he had pain in the midline of his stomach. His pain on 01/19/17 was from RLQ to LLQ. He had one episode of non-bilious, non-bloody emesis today. None since. Has been able to handle PO after. He reports that he has had pain in the periumbilical area, occurring every 15 min and lasting for 7-8 seconds. He says it is a throbbing pain that is worse with movement. He has not taken anything for this. He denies fevers, chills, myalgias, DOE, SOB, chest pain, dysuria, flank pain,frequency, urgency, hematuria diarrhea, hematochezia or melena or constipation. LBM today. States soft and normal for him. No prior abdominal surgery. No recent cessation in smoking.   HPI  History reviewed. No pertinent past medical history.  There are no active problems to display for this patient.   History reviewed. No pertinent surgical history.     Home Medications    Prior to Admission medications   Medication Sig Start Date End Date Taking? Authorizing Provider  Ca Carbonate-Mag Hydroxide 550-110 MG CHEW Chew 1 tablet by mouth daily as needed (GI Upset).   Yes [provider]  ciprofloxacin (CIPRO) 250 MG tablet Take 1 tablet  (250 mg total) by mouth every 12 (twelve) hours. 01/19/17  Yes Sherwood Gambler, MD  HYDROcodone-acetaminophen (NORCO) 5-325 MG tablet Take 1-2 tablets by mouth every 4 (four) hours as needed for severe pain. 01/19/17  Yes Sherwood Gambler, MD  magnesium citrate ([employer] CITRATE OF MAGNESIA) SOLN Take 1 Bottle by mouth once.   Yes [provider]  metroNIDAZOLE (FLAGYL) 500 MG tablet Take 1 tablet (500 mg total) by mouth 2 (two) times daily. One po bid x 7 days 01/19/17  Yes Sherwood Gambler, MD  ondansetron (ZOFRAN ODT) 4 MG disintegrating tablet Take 1 tablet (4 mg total) by mouth every 8 (eight) hours as needed for nausea or vomiting. 01/19/17  Yes Sherwood Gambler, MD    Family History History reviewed. No pertinent family history.  Social History Social History  Substance Use Topics  . Smoking status: Current Every Day Smoker  . Smokeless tobacco: Not on file  . Alcohol use Yes     Allergies   Asa [aspirin]   Review of Systems Review of Systems  All other systems reviewed and are negative.    Physical Exam Updated Vital Signs BP 112/71 (BP Location: Right Arm)   Pulse 94   Temp 98.2 F (36.8 C) (Oral)   Resp 18   SpO2 100%   Physical Exam  Constitutional: He appears well-developed and well-nourished.  Non-toxic appearing  HENT:  Head: Normocephalic and atraumatic.  Right Ear: External ear normal.  Left Ear: External ear normal.  Nose: Nose normal.  Mouth/Throat: Uvula is midline, oropharynx is clear and moist and mucous membranes are normal. No tonsillar exudate.  Eyes: Pupils are equal, round, and reactive to light. Right eye exhibits no discharge. Left eye exhibits no discharge. No scleral icterus.  Neck: Trachea normal and normal range of motion. Neck supple. No spinous process tenderness present. No neck rigidity. Normal range of motion present.  Cardiovascular: Normal rate, regular rhythm and intact distal pulses.   No murmur heard. Pulses:      Radial pulses  are 2+ on the right side, and 2+ on the left side.       Dorsalis pedis pulses are 2+ on the right side, and 2+ on the left side.       Posterior tibial pulses are 2+ on the right side, and 2+ on the left side.  No lower extremity swelling or edema. Calves symmetric in size bilaterally.  Pulmonary/Chest: Effort normal and breath sounds normal. He exhibits no tenderness.  Abdominal: Soft. Normal appearance and bowel sounds are normal. He exhibits no distension. There is tenderness in the right lower quadrant, periumbilical area and suprapubic area. There is guarding. There is no rebound and no CVA tenderness.  Musculoskeletal: He exhibits no edema.  Lymphadenopathy:    He has no cervical adenopathy.  Neurological: He is alert.  Skin: Skin is warm and dry. No rash noted. He is not diaphoretic.  Psychiatric: He has a normal mood and affect.  Nursing note and vitals reviewed.    ED Treatments / Results  Labs (all labs ordered are listed, but only abnormal results are displayed) Labs Reviewed  COMPREHENSIVE METABOLIC PANEL - Abnormal; Notable for the following:       Result Value   Potassium 3.3 (*)    Chloride 95 (*)    Glucose, Bld 102 (*)    Albumin 3.2 (*)    All other components within normal limits  CBC - Abnormal; Notable for the following:    WBC 13.7 (*)    Platelets 522 (*)    All other components within normal limits  URINALYSIS, ROUTINE W REFLEX MICROSCOPIC - Abnormal; Notable for the following:    Leukocytes, UA TRACE (*)    Squamous Epithelial / LPF 0-5 (*)    All other components within normal limits  LIPASE, BLOOD    EKG  EKG Interpretation None       Radiology Ct Abdomen Pelvis W Contrast  Result Date: 01/24/2017 CLINICAL DATA:  Abdominal pain. Recent abdominal CT demonstrating distal ileal inflammation and dilated appendix. Persistent pain. Ran out of pain medication. EXAM: CT ABDOMEN AND PELVIS WITH CONTRAST TECHNIQUE: Multidetector CT imaging of the  abdomen and pelvis was performed using the standard protocol following bolus administration of intravenous contrast. CONTRAST:  180m ISOVUE-300 IOPAMIDOL (ISOVUE-300) INJECTION 61% COMPARISON:  CT 5 days prior 01/19/2017 FINDINGS: Lower chest: The lung bases are clear. Hepatobiliary: No focal hepatic lesion. Liver is prominent size spanning 19 cm cranial caudal. Gallbladder physiologically distended, no calcified stone. No biliary dilatation. Pancreas: No ductal dilatation or inflammation. Spleen: Normal in size without focal abnormality. Adrenals/Urinary Tract: Normal adrenal glands. No hydronephrosis or perinephric edema. Homogeneous renal enhancement with symmetric excretion on delayed phase imaging. Urinary bladder is physiologically distended. No bladder wall thickening. Stomach/Bowel: Note: bowel evaluation is suboptimal given lack of enteric contrast and paucity of intra-abdominal fat. Progressive inflammatory changes in the right lower quadrant from prior exam. Fluid-filled structure in the right lower quadrant with enhancement, thick wall and surrounding soft  tissue inflammation (axial images 49 through 57) favored to represent edematous inflamed terminal ileum, however bowel anatomy is suboptimally defined. Differential considerations include abscess. Fecalization of adjacent distal ileal bowel loops. There is wall thickening of a send colon and likely cecum which is not well defined. The appendix is not confidently visualized on the current exam. Mild sigmoid colonic wall thickening is likely reactive. Stomach is nondistended. No small bowel dilatation to suggest obstruction or ileus. Vascular/Lymphatic: Aortic and branch atherosclerosis. Prominent reactive appearing ileocolic nodes. Portal vein is patent. Reproductive: Prostatic calcifications. Other: Right lower quadrant soft tissue stranding and mesenteric edema related to bowel inflammation. The fluid-filled right lower quadrant structure with thick  wall and surrounding soft tissue inflammation favored to represent terminal ileum, with differential consideration of abscess. There is otherwise no evidence of intra-abdominal/ pelvic fluid collection. No free air. Musculoskeletal: There are no acute or suspicious osseous abnormalities. IMPRESSION: 1. Worsening inflammatory process in the right lower quadrant over the past 5 days. An inflamed fluid-filled tubular structure is favored to represent progressive terminal ileum disease with increased surrounding soft tissue inflammation. Given location, Crohn's disease is suspected, however infectious ileitis is also considered. The differential is broad and includes abscess secondary to perforated appendix, as the appendix is not definitively visualized. Meckel's diverticulitis is also considered but felt less likely. There is progressive wall thickening of the ascending colon which may be primary bowel inflammation or reactive. Additionally there is sigmoid colonic wall thickening which is likely reactive. 2. Right lower quadrant bowel anatomy is not well-defined given lack of enteric contrast. Consider follow-up CT with full oral contrast prep and IV contrast. GI consultation may be of value. 3. Incidental aortic atherosclerosis. Aortic Atherosclerosis (ICD10-I70.0). These results were called by telephone at the time of interpretation on 01/24/2017 at 7:59 pm to Hamberg , who verbally acknowledged these results. Electronically Signed   By: Jeb Levering M.D.   On: 01/24/2017 20:04    Procedures Procedures (including critical care time)  Medications Ordered in ED Medications  iopamidol (ISOVUE-300) 61 % injection (not administered)  sodium chloride 0.9 % bolus 1,000 mL (0 mLs Intravenous Stopped 01/24/17 2003)  iopamidol (ISOVUE-300) 61 % injection 100 mL (100 mLs Intravenous Contrast Given 01/24/17 1921)  morphine 4 MG/ML injection 4 mg (4 mg Intravenous Given 01/24/17 2025)  ondansetron (ZOFRAN)  injection 4 mg (4 mg Intravenous Given 01/24/17 2025)  piperacillin-tazobactam (ZOSYN) IVPB 3.375 g (3.375 g Intravenous New Bag/Given 01/24/17 2133)  sodium chloride 0.9 % bolus 1,000 mL (1,000 mLs Intravenous New Bag/Given 01/24/17 2131)     Initial Impression / Assessment and Plan / ED Course  I have reviewed the triage vital signs and the nursing notes.  Pertinent labs & imaging results that were available during my care of the patient were reviewed by me and considered in my medical decision making (see chart for details).      46 year old male who presents to the ED for worsening symptoms after being diagnosed with ileitis on 01/19/17. Patient CT on 01/19/17 showed segmental moderate mural thickening of distal and terminal ileum consistent with an ileitis. There was also confounding findings of a distended thickened appendix, measuring up to 12 mm also in the right lower quadrant adjacent to the inflamed distal ileum. Findings said it could be due to sympathetic thickening of the appendix due to adjacent ileitis or vice versa, with favor an ileitis with sympathetic thickening of the appendix given the more segmental moderate thickened appearance  of the ileum. Patient advised to stay for overnight observation but refused. Patient  was sent home with nausea medication, flagyl, cipro and pain control.   Patient re-presented today due to continued pain and for "follow-up". Patient states that he does not have PCP so he returned here for follow up. Presented today with subjective distension, 1 episode of non-bilious, non-bloody emesis and continued pain. He notes that the pain went from RLQ to LLQ on 01/19/17 and is now midline of abdomen. On exam patient is noted to have guarding on exam and tenderness in the midline & RLQ of abdomen. Will repeat CT to ensure patient is not worsening. Patient given IVF, pain medication and nausea medication. Labs drawn in triage show normal lipase. Mild leukocytosis that has  decreased since the 01/19/17 from 16.0 to 13.7.   The report of the CT of the abdomen shows worsening inflammatory process in the right lower quadrant over the last 5 days. CT report states that given location, Crohn's disease is suspected, however infectious ileitis vs. Perforated appendix, vs Meckel's diverticulitis. Recommend CT with full oral contrast prep and IV contrast follow up. Will place consult to surgery to evaluate.   Dr. Zella Richer returned consult and will see the patient. Dr. Zella Richer recommends the patient stay in the hospital but patient reports that he must go back to his motel or he will lose his stuff. Dr. Zella Richer states this is AMA but he will have H&P and orders ready for the morning and recommends the patient return. Advised we give the patient IVF and Zoysn will in hospital.   I discussed with the patient I advise him to stay in the hospital tonight. We discussed the nature and purpose, risks and benefits, as well as, the alternatives of treatment. Time was given to allow the opportunity to ask questions and consider their options, and after the discussion, the patient decided to refuse the offerred treatment. The patient was informed that refusal could lead to, but was not limited to, death, permanent disability, or severe pain. If present, I asked the relatives or significant others to dissuade them without success. Prior to refusing, I determined that the patient had the capacity to make their decision and understood the consequences of that decision. After refusal, I made every reasonable opportunity to treat them to the best of my ability.  The patient was notified that they may return to the emergency department at any time for further treatment. I recommended him to return in the morning as per above.    Final Clinical Impressions(s) / ED Diagnoses   Final diagnoses:  Lower abdominal pain    New Prescriptions New Prescriptions   No medications on file       Lorelle Gibbs 01/24/17 2207    Fredia Sorrow, MD 01/24/17 2208

## 2017-01-24 NOTE — ED Notes (Signed)
ED Provider at bedside. 

## 2017-01-24 NOTE — ED Notes (Signed)
Patient left AMA.

## 2017-01-24 NOTE — H&P (Signed)
Derek Lloyd is an 46 y.o. male.   Chief Complaint:  Lower abdominal pain HPI: This 46 year old male who presents today for follow-up of his persistent lower abdominal pain.  He was seen in the emergency department January 20, 1999 1:18 day history of progressive lower abdominal pain with an episode of vomiting.  Also had some diarrhea.  At that time he was found to have what was felt to be right lower quadrant infectious process.  It was recommended he be admitted to the hospital but he refused.  He was sent home on oral antibiotics.  He comes back today for follow-up.  He has had some vomiting today.  He still having pain.  He states he feels some pelvic fullness between his scrotum and anus.  He still has a leukocytosis but no tachycardia or fever.  Repeat CT scan shows a more complicated infectious process.  Initial CT scan done 5 days ago demonstrated a dilated appendix (13 mm) with some surrounding distal ileal inflammatory change.  CT scan shows that the worsening process there may be a fluid collection in that region as well as some thickening of the sigmoid colon.  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 He lives in a hotel.  History reviewed. No pertinent past medical history.  History reviewed. No pertinent surgical history.  History reviewed. No pertinent family history. Social History:  reports that he has been smoking.  He does not have any smokeless tobacco history on file. He reports that he drinks alcohol. His drug history is not on file.  Allergies:  Allergies  Allergen Reactions  . Asa [Aspirin] Rash    Childhood reaction; but tolerates well     Prior to Admission medications   Medication Sig Start Date End Date Taking? Authorizing Provider  Ca Carbonate-Mag Hydroxide 550-110 MG CHEW Chew 1 tablet by mouth daily as needed (GI Upset).   Yes [provider]  ciprofloxacin (CIPRO) 250 MG tablet Take 1 tablet (250 mg total) by mouth every 12  (twelve) hours. 01/19/17  Yes Sherwood Gambler, MD  HYDROcodone-acetaminophen (NORCO) 5-325 MG tablet Take 1-2 tablets by mouth every 4 (four) hours as needed for severe pain. 01/19/17  Yes Sherwood Gambler, MD  magnesium citrate ([employer] CITRATE OF MAGNESIA) SOLN Take 1 Bottle by mouth once.   Yes [provider]  metroNIDAZOLE (FLAGYL) 500 MG tablet Take 1 tablet (500 mg total) by mouth 2 (two) times daily. One po bid x 7 days 01/19/17  Yes Sherwood Gambler, MD  ondansetron (ZOFRAN ODT) 4 MG disintegrating tablet Take 1 tablet (4 mg total) by mouth every 8 (eight) hours as needed for nausea or vomiting. 01/19/17  Yes Sherwood Gambler, MD     Results for orders placed or performed during the hospital encounter of 01/24/17 (from the past 48 hour(s))  Lipase, blood     Status: None   Collection Time: 01/24/17  3:35 PM  Result Value Ref Range   Lipase 17 11 - 51 U/L  Comprehensive metabolic panel     Status: Abnormal   Collection Time: 01/24/17  3:35 PM  Result Value Ref Range   Sodium 136 135 - 145 mmol/L   Potassium 3.3 (L) 3.5 - 5.1 mmol/L   Chloride 95 (L) 101 - 111 mmol/L   CO2 29 22 - 32 mmol/L   Glucose, Bld 102 (H) 65 - 99 mg/dL   BUN 8 6 - 20 mg/dL   Creatinine, Ser 0.91 0.61 - 1.24 mg/dL  Calcium 9.1 8.9 - 10.3 mg/dL   Total Protein 7.0 6.5 - 8.1 g/dL   Albumin 3.2 (L) 3.5 - 5.0 g/dL   AST 33 15 - 41 U/L   ALT 21 17 - 63 U/L   Alkaline Phosphatase 81 38 - 126 U/L   Total Bilirubin 0.5 0.3 - 1.2 mg/dL   GFR calc non Af Amer >60 >60 mL/min   GFR calc Af Amer >60 >60 mL/min    Comment: (NOTE) The eGFR has been calculated using the CKD EPI equation. This calculation has not been validated in all clinical situations. eGFR's persistently <60 mL/min signify possible Chronic Kidney Disease.    Anion gap 12 5 - 15  CBC     Status: Abnormal   Collection Time: 01/24/17  3:35 PM  Result Value Ref Range   WBC 13.7 (H) 4.0 - 10.5 K/uL   RBC 4.63 4.22 - 5.81 MIL/uL   Hemoglobin 15.2  13.0 - 17.0 g/dL   HCT 44.4 39.0 - 52.0 %   MCV 95.9 78.0 - 100.0 fL   MCH 32.8 26.0 - 34.0 pg   MCHC 34.2 30.0 - 36.0 g/dL   RDW 12.9 11.5 - 15.5 %   Platelets 522 (H) 150 - 400 K/uL  Urinalysis, Routine w reflex microscopic     Status: Abnormal   Collection Time: 01/24/17  8:13 PM  Result Value Ref Range   Color, Urine YELLOW YELLOW   APPearance CLEAR CLEAR   Specific Gravity, Urine 1.026 1.005 - 1.030   pH 5.0 5.0 - 8.0   Glucose, UA NEGATIVE NEGATIVE mg/dL   Hgb urine dipstick NEGATIVE NEGATIVE   Bilirubin Urine NEGATIVE NEGATIVE   Ketones, ur NEGATIVE NEGATIVE mg/dL   Protein, ur NEGATIVE NEGATIVE mg/dL   Nitrite NEGATIVE NEGATIVE   Leukocytes, UA TRACE (A) NEGATIVE   RBC / HPF NONE SEEN 0 - 5 RBC/hpf   WBC, UA 0-5 0 - 5 WBC/hpf   Bacteria, UA NONE SEEN NONE SEEN   Squamous Epithelial / LPF 0-5 (A) NONE SEEN   Mucus PRESENT    Ct Abdomen Pelvis W Contrast  Result Date: 01/24/2017 CLINICAL DATA:  Abdominal pain. Recent abdominal CT demonstrating distal ileal inflammation and dilated appendix. Persistent pain. Ran out of pain medication. EXAM: CT ABDOMEN AND PELVIS WITH CONTRAST TECHNIQUE: Multidetector CT imaging of the abdomen and pelvis was performed using the standard protocol following bolus administration of intravenous contrast. CONTRAST:  126m ISOVUE-300 IOPAMIDOL (ISOVUE-300) INJECTION 61% COMPARISON:  CT 5 days prior 01/19/2017 FINDINGS: Lower chest: The lung bases are clear. Hepatobiliary: No focal hepatic lesion. Liver is prominent size spanning 19 cm cranial caudal. Gallbladder physiologically distended, no calcified stone. No biliary dilatation. Pancreas: No ductal dilatation or inflammation. Spleen: Normal in size without focal abnormality. Adrenals/Urinary Tract: Normal adrenal glands. No hydronephrosis or perinephric edema. Homogeneous renal enhancement with symmetric excretion on delayed phase imaging. Urinary bladder is physiologically distended. No bladder wall  thickening. Stomach/Bowel: Note: bowel evaluation is suboptimal given lack of enteric contrast and paucity of intra-abdominal fat. Progressive inflammatory changes in the right lower quadrant from prior exam. Fluid-filled structure in the right lower quadrant with enhancement, thick wall and surrounding soft tissue inflammation (axial images 49 through 57) favored to represent edematous inflamed terminal ileum, however bowel anatomy is suboptimally defined. Differential considerations include abscess. Fecalization of adjacent distal ileal bowel loops. There is wall thickening of a send colon and likely cecum which is not well defined. The appendix is not confidently  visualized on the current exam. Mild sigmoid colonic wall thickening is likely reactive. Stomach is nondistended. No small bowel dilatation to suggest obstruction or ileus. Vascular/Lymphatic: Aortic and branch atherosclerosis. Prominent reactive appearing ileocolic nodes. Portal vein is patent. Reproductive: Prostatic calcifications. Other: Right lower quadrant soft tissue stranding and mesenteric edema related to bowel inflammation. The fluid-filled right lower quadrant structure with thick wall and surrounding soft tissue inflammation favored to represent terminal ileum, with differential consideration of abscess. There is otherwise no evidence of intra-abdominal/ pelvic fluid collection. No free air. Musculoskeletal: There are no acute or suspicious osseous abnormalities. IMPRESSION: 1. Worsening inflammatory process in the right lower quadrant over the past 5 days. An inflamed fluid-filled tubular structure is favored to represent progressive terminal ileum disease with increased surrounding soft tissue inflammation. Given location, Crohn's disease is suspected, however infectious ileitis is also considered. The differential is broad and includes abscess secondary to perforated appendix, as the appendix is not definitively visualized. Meckel's  diverticulitis is also considered but felt less likely. There is progressive wall thickening of the ascending colon which may be primary bowel inflammation or reactive. Additionally there is sigmoid colonic wall thickening which is likely reactive. 2. Right lower quadrant bowel anatomy is not well-defined given lack of enteric contrast. Consider follow-up CT with full oral contrast prep and IV contrast. GI consultation may be of value. 3. Incidental aortic atherosclerosis. Aortic Atherosclerosis (ICD10-I70.0). These results were called by telephone at the time of interpretation on 01/24/2017 at 7:59 pm to [place] , who verbally acknowledged these results. Electronically Signed   By: Jeb Levering M.D.   On: 01/24/2017 20:04    Review of Systems  Constitutional: Negative for chills and fever.  Gastrointestinal: Positive for abdominal pain, nausea and vomiting. Negative for blood in stool and diarrhea.  Genitourinary: Negative for dysuria and hematuria.  Endo/Heme/Allergies: Does not bruise/bleed easily.       No DVT or bleeding disorders.    Blood pressure 115/82, pulse 88, temperature 98.5 F (36.9 C), temperature source Oral, resp. rate 20, SpO2 100 %. Physical Exam  GENERAL APPEARANCE:  Thin male in NAD.  Pleasant and cooperative.  EARS, NOSE, MOUTH THROAT:  [place]/AT external ears:  no lesions or deformities external nose:  no lesions or deformities hearing:  grossly normal lips:  moist, no deformities EYES external: conjunctiva, lids, sclerae normal pupils:  equal, round glasses: no   CV ascultation:  RRR, no murmur extremity edema:  no  RESP auscultation:  breath sounds equal and clear respiratory effort:  normal   GASTROINTESTINAL abdomen:  Soft, right lower quadrant and suprapubic tenderness and guarding, some distended, no masses liver and spleen:  not enlarged. hernia:  none present scar:  none present  MUSCULOSKELETAL deformities:  none  SKIN rash or  lesion: none subcutaneous nodules: 1.5 cm area right face  NEUROLOGIC speech:  normal  PSYCHIATRIC alertness and orientation:  normal mood/affect/behavior:  normal judgement and insight:  normal   Assessment/Plan Progressive right lower quadrant inflammatory process with unclear etiology.  Possibilities include complicated appendicitis with perforation and abscess, terminal ileitis, cecal diverticulitis.  Appears to be a progressive process.  Plan: I recommended admission to the hospital, IV antibiotics and IV fluid hydration, CT scan with oral and IV contrast tomorrow morning after adequate hydration had been done.  He reports that because he is loose in the hotel if he does not return to the room tonight he may lose all of his items and lose the  room.  He states that he will come back in the morning for a planned admission.  I advised against this but he states he is not able to stay.  I have asked the emergency department PA to give him another liter of IV fluids and a dose of Zosyn.  Will leave direct admission orders for him.  Odis Hollingshead, MD 01/24/2017, 9:13 PM

## 2017-01-26 ENCOUNTER — Inpatient Hospital Stay (HOSPITAL_COMMUNITY)
Admission: EM | Admit: 2017-01-26 | Discharge: 2017-01-29 | DRG: 373 | Discharge status: Against Medical Advice | Payer: BLUE CROSS/BLUE SHIELD | Attending: Surgery | Admitting: Surgery

## 2017-01-26 ENCOUNTER — Encounter (HOSPITAL_COMMUNITY): Payer: Self-pay | Admitting: Emergency Medicine

## 2017-01-26 ENCOUNTER — Emergency Department (HOSPITAL_COMMUNITY): Payer: BLUE CROSS/BLUE SHIELD

## 2017-01-26 DIAGNOSIS — K353 Acute appendicitis with localized peritonitis: Principal | ICD-10-CM | POA: Diagnosis present

## 2017-01-26 DIAGNOSIS — K529 Noninfective gastroenteritis and colitis, unspecified: Secondary | ICD-10-CM | POA: Diagnosis present

## 2017-01-26 DIAGNOSIS — I700 Atherosclerosis of aorta: Secondary | ICD-10-CM | POA: Diagnosis present

## 2017-01-26 DIAGNOSIS — R1031 Right lower quadrant pain: Secondary | ICD-10-CM

## 2017-01-26 DIAGNOSIS — F17200 Nicotine dependence, unspecified, uncomplicated: Secondary | ICD-10-CM | POA: Diagnosis present

## 2017-01-26 DIAGNOSIS — K651 Peritoneal abscess: Secondary | ICD-10-CM | POA: Diagnosis present

## 2017-01-26 LAB — CBC
HCT: 43.3 % (ref 39.0–52.0)
HCT: 40.5 % (ref 39.0–52.0)
HEMOGLOBIN: 14.8 g/dL (ref 13.0–17.0)
Hemoglobin: 13.4 g/dL (ref 13.0–17.0)
MCH: 33 pg (ref 26.0–34.0)
MCH: 32.1 pg (ref 26.0–34.0)
MCHC: 34.2 g/dL (ref 30.0–36.0)
MCHC: 33.1 g/dL (ref 30.0–36.0)
MCV: 96.4 fL (ref 78.0–100.0)
MCV: 96.9 fL (ref 78.0–100.0)
PLATELETS: 601 10*3/uL — AB (ref 150–400)
PLATELETS: 559 10*3/uL — AB (ref 150–400)
RBC: 4.49 MIL/uL (ref 4.22–5.81)
RBC: 4.18 MIL/uL — AB (ref 4.22–5.81)
RDW: 12.9 % (ref 11.5–15.5)
RDW: 13.1 % (ref 11.5–15.5)
WBC: 14.1 10*3/uL — ABNORMAL HIGH (ref 4.0–10.5)
WBC: 12.6 10*3/uL — AB (ref 4.0–10.5)

## 2017-01-26 LAB — CREATININE, SERUM: CREATININE: 0.91 mg/dL (ref 0.61–1.24)

## 2017-01-26 LAB — COMPREHENSIVE METABOLIC PANEL
ALBUMIN: 3.3 g/dL — AB (ref 3.5–5.0)
ALK PHOS: 84 U/L (ref 38–126)
ALT: 22 U/L (ref 17–63)
ANION GAP: 9 (ref 5–15)
AST: 25 U/L (ref 15–41)
BILIRUBIN TOTAL: 0.6 mg/dL (ref 0.3–1.2)
BUN: 6 mg/dL (ref 6–20)
CALCIUM: 9.3 mg/dL (ref 8.9–10.3)
CO2: 28 mmol/L (ref 22–32)
CREATININE: 0.87 mg/dL (ref 0.61–1.24)
Chloride: 103 mmol/L (ref 101–111)
GFR calc Af Amer: >60 mL/min (ref >60)
GFR calc non Af Amer: >60 mL/min (ref >60)
GLUCOSE: 111 mg/dL — AB (ref 65–99)
Potassium: 4.1 mmol/L (ref 3.5–5.1)
Sodium: 140 mmol/L (ref 135–145)
TOTAL PROTEIN: 7.4 g/dL (ref 6.5–8.1)

## 2017-01-26 LAB — LIPASE, BLOOD: Lipase: 17 U/L (ref 11–51)

## 2017-01-26 MED ORDER — ENOXAPARIN SODIUM 40 MG/0.4ML ~~LOC~~ SOLN
40.0000 mg | SUBCUTANEOUS | Status: DC
  Filled 2017-01-26: qty 0.4

## 2017-01-26 MED ORDER — PIPERACILLIN-TAZOBACTAM 3.375 G IVPB
3.3750 g | Freq: Three times a day (TID) | INTRAVENOUS | Status: DC
  Administered 2017-01-26 – 2017-01-29 (×8): 3.375 g via INTRAVENOUS
  Filled 2017-01-26 (×11): qty 50

## 2017-01-26 MED ORDER — HYDROMORPHONE HCL 1 MG/ML IJ SOLN: 1.0000 mg | INTRAMUSCULAR | Status: DC | PRN

## 2017-01-26 MED ORDER — HYDROCODONE-ACETAMINOPHEN 5-325 MG PO TABS
1.0000 | ORAL_TABLET | ORAL | Status: DC | PRN
  Administered 2017-01-26 – 2017-01-29 (×12): 2 via ORAL
  Filled 2017-01-26 (×12): qty 2

## 2017-01-26 MED ORDER — ONDANSETRON HCL 4 MG/2ML IJ SOLN
4.0000 mg | Freq: Four times a day (QID) | INTRAMUSCULAR | Status: DC | PRN
  Administered 2017-01-28: 4 mg via INTRAVENOUS
  Filled 2017-01-26: qty 2

## 2017-01-26 MED ORDER — ONDANSETRON HCL 4 MG/2ML IJ SOLN
4.0000 mg | Freq: Once | INTRAMUSCULAR | Status: AC
  Administered 2017-01-26: 4 mg via INTRAVENOUS
  Filled 2017-01-26: qty 2

## 2017-01-26 MED ORDER — ACETAMINOPHEN 325 MG PO TABS: 650.0000 mg | ORAL_TABLET | Freq: Four times a day (QID) | ORAL | Status: DC | PRN

## 2017-01-26 MED ORDER — IOPAMIDOL (ISOVUE-300) INJECTION 61%
100.0000 mL | Freq: Once | INTRAVENOUS | Status: AC | PRN
  Administered 2017-01-26: 100 mL via INTRAVENOUS

## 2017-01-26 MED ORDER — MORPHINE SULFATE (PF) 4 MG/ML IV SOLN
4.0000 mg | Freq: Once | INTRAVENOUS | Status: AC
  Administered 2017-01-26: 4 mg via INTRAVENOUS
  Filled 2017-01-26: qty 1

## 2017-01-26 MED ORDER — SODIUM CHLORIDE 0.9 % IV BOLUS (SEPSIS)
1000.0000 mL | Freq: Once | INTRAVENOUS | Status: AC
  Administered 2017-01-26: 1000 mL via INTRAVENOUS

## 2017-01-26 MED ORDER — IOPAMIDOL (ISOVUE-300) INJECTION 61%
INTRAVENOUS | Status: AC
  Filled 2017-01-26: qty 100

## 2017-01-26 MED ORDER — KCL IN DEXTROSE-NACL 20-5-0.45 MEQ/L-%-% IV SOLN
INTRAVENOUS | Status: DC
  Administered 2017-01-26: 20:00:00 via INTRAVENOUS
  Administered 2017-01-27 – 2017-01-28 (×2): 1000 mL via INTRAVENOUS
  Administered 2017-01-29: 05:00:00 via INTRAVENOUS
  Filled 2017-01-26 (×6): qty 1000

## 2017-01-26 MED ORDER — ACETAMINOPHEN 650 MG RE SUPP: 650.0000 mg | Freq: Four times a day (QID) | RECTAL | Status: DC | PRN

## 2017-01-26 MED ORDER — PIPERACILLIN-TAZOBACTAM 3.375 G IVPB 30 MIN
3.3750 g | Freq: Once | INTRAVENOUS | Status: AC
  Administered 2017-01-26: 3.375 g via INTRAVENOUS
  Filled 2017-01-26: qty 50

## 2017-01-26 MED ORDER — ONDANSETRON 4 MG PO TBDP
4.0000 mg | ORAL_TABLET | Freq: Four times a day (QID) | ORAL | Status: DC | PRN
Start: 2017-01-26 — End: 2017-01-29

## 2017-01-26 NOTE — ED Notes (Signed)
Pt refusing to sit in wheelchair. He is insisting that he will walk upstairs to his bed, or he will leave. Pt walked upstairs with RN and NT to assist if needed. Pt has a steady gait and no ambulation issues. A&Ox4.

## 2017-01-26 NOTE — ED Triage Notes (Signed)
Pt reports ongoing abd pain. Was here 2 days ago for same and was found to have ilitis and enlarged appendix. Surgery was consulted and it was recommended that pt be admitted for observation. Pt left AMA afterwards, but symptoms continue.

## 2017-01-26 NOTE — ED Provider Notes (Signed)
[facility] DEPT Provider Note   CSN: [id number] Arrival date & time: 01/26/17  1222     History   Chief Complaint Chief Complaint  Patient presents with  . Abdominal Pain    HPI Derek Lloyd is a 46 y.o. male.  Pt presents to the ED today with continued abdominal pain.  The pt was here last night for abdominal pain and n/v.  He had a CT scan of his abdomen and pelvis which showed a RLQ inflammatory process of unclear etiology.  Dr. Zella Richer (surgery) saw him and there was a plan for admission for IV abx and IV fluid hydration.  The pt had to leave AMA as he was staying in a hotel and he was concerned his belongings would be thrown out.  Pt came back today with continued abdominal pain.  Dr. Zella Richer said he left direct admission orders for him, but bed control does not have those orders.       History reviewed. No pertinent past medical history.  Patient Active Problem List   Diagnosis Date Noted  . Right lower quadrant abdominal abscess ([place]) 01/26/2017    History reviewed. No pertinent surgical history.     Home Medications    Prior to Admission medications   Medication Sig Start Date End Date Taking? Authorizing Provider  acetaminophen (TYLENOL) 500 MG tablet Take 500 mg by mouth every 6 (six) hours as needed.   Yes [provider]  ciprofloxacin (CIPRO) 250 MG tablet Take 1 tablet (250 mg total) by mouth every 12 (twelve) hours. 01/19/17  Yes Sherwood Gambler, MD  HYDROcodone-acetaminophen (NORCO) 5-325 MG tablet Take 1-2 tablets by mouth every 4 (four) hours as needed for severe pain. 01/19/17  Yes Sherwood Gambler, MD  metroNIDAZOLE (FLAGYL) 500 MG tablet Take 1 tablet (500 mg total) by mouth 2 (two) times daily. One po bid x 7 days 01/19/17  Yes Sherwood Gambler, MD  ondansetron (ZOFRAN ODT) 4 MG disintegrating tablet Take 1 tablet (4 mg total) by mouth every 8 (eight) hours as needed for nausea or vomiting. 01/19/17  Yes Sherwood Gambler, MD    Family  History History reviewed. No pertinent family history.  Social History Social History  Substance Use Topics  . Smoking status: Current Every Day Smoker  . Smokeless tobacco: Not on file  . Alcohol use Yes     Allergies   Asa [aspirin]   Review of Systems Review of Systems  Gastrointestinal: Positive for abdominal pain, nausea and vomiting.  All other systems reviewed and are negative.    Physical Exam Updated Vital Signs BP 116/63 (BP Location: Right Arm) Comment: Simultaneous filing. User may not have seen previous data.  Pulse 83 Comment: Simultaneous filing. User may not have seen previous data.  Temp 98.4 F (36.9 C) (Oral)   Resp 18   Ht 5' 10"  (1.778 m)   Wt 61.2 kg (135 lb)   SpO2 99% Comment: Simultaneous filing. User may not have seen previous data.  BMI 19.37 kg/m   Physical Exam  Constitutional: He is oriented to person, place, and time. He appears well-developed and well-nourished.  HENT:  Head: Normocephalic and atraumatic.  Right Ear: External ear normal.  Left Ear: External ear normal.  Nose: Nose normal.  Mouth/Throat: Mucous membranes are dry.  Eyes: Pupils are equal, round, and reactive to light. Conjunctivae and EOM are normal.  Neck: Normal range of motion. Neck supple.  Cardiovascular: Normal rate, regular rhythm, normal heart sounds and intact distal pulses.  Pulmonary/Chest: Effort normal and breath sounds normal.  Abdominal: Soft. Bowel sounds are normal. There is tenderness in the right lower quadrant and left lower quadrant.  Musculoskeletal: Normal range of motion.  Neurological: He is alert and oriented to person, place, and time.  Skin: Skin is warm.  Psychiatric: He has a normal mood and affect. His behavior is normal. Judgment and thought content normal.  Nursing note and vitals reviewed.    ED Treatments / Results  Labs (all labs ordered are listed, but only abnormal results are displayed) Labs Reviewed  COMPREHENSIVE  METABOLIC PANEL - Abnormal; Notable for the following:       Result Value   Glucose, Bld 111 (*)    Albumin 3.3 (*)    All other components within normal limits  CBC - Abnormal; Notable for the following:    WBC 14.1 (*)    Platelets 601 (*)    All other components within normal limits  LIPASE, BLOOD  HIV ANTIBODY (ROUTINE TESTING)  CBC  CREATININE, SERUM  CBC  BASIC METABOLIC PANEL    EKG  EKG Interpretation None       Radiology Ct Abdomen Pelvis W Contrast  Result Date: 01/26/2017 CLINICAL DATA:  46 year old male with ongoing abdominal pain. EXAM: CT ABDOMEN AND PELVIS WITH CONTRAST TECHNIQUE: Multidetector CT imaging of the abdomen and pelvis was performed using the standard protocol following bolus administration of intravenous contrast. CONTRAST:  162m ISOVUE-300 IOPAMIDOL (ISOVUE-300) INJECTION 61% COMPARISON:  CT the abdomen and pelvis 01/24/2017. FINDINGS: Lower chest: Unremarkable. Hepatobiliary: No cystic or solid hepatic lesions. No intra or extrahepatic biliary ductal dilatation. Gallbladder is normal in appearance. Pancreas: No pancreatic mass. No pancreatic ductal dilatation. No pancreatic or peripancreatic fluid or inflammatory changes. Spleen: Unremarkable. Adrenals/Urinary Tract: Bilateral kidneys and bilateral adrenal glands are normal in appearance. There is no hydroureteronephrosis. Urinary bladder is normal in appearance. Stomach/Bowel: The appearance of the stomach is normal. No pathologic dilatation of small bowel or colon. Profound thickening of the terminal ileum is again noted with extensive surrounding inflammatory changes. There is again a dilated fluid-filled structure with rim enhancement in the right lower quadrant which is coincident in location to the dilated appendix seen on original CT of the abdomen and pelvis 01/19/2017, strongly favored to represent a large appendiceal abscess. This currently measures approximately 6.3 x 3.3 x 2.7 cm (axial image 54 of  series 2 and coronal image 43 of series 4), with thick rim of enhancement and extensive surrounding inflammatory changes. Some abnormal soft tissue is seen extending caudally from the appendiceal abscess tracking into the low anatomic pelvis ultimately coming in contact with the distal rectum (axial images 60-66 of series 2 and coronal images 52-82 of series 4), concerning for potential enterocolic fistula. Vascular/Lymphatic: Aortic atherosclerosis, without evidence of aneurysm or dissection in the abdominal or pelvic vasculature. Multiple prominent borderline enlarged ileocolic lymph nodes are noted, presumably reactive. No other lymphadenopathy is noted elsewhere in the abdomen or pelvis. Reproductive: Prostate gland and seminal vesicles are unremarkable in appearance. Other: Trace volume of reactive free fluid in the lower abdomen and anatomic pelvis. No pneumoperitoneum. Musculoskeletal: There are no aggressive appearing lytic or blastic lesions noted in the visualized portions of the skeleton. IMPRESSION: 1. Persistent changes of severe ileitis with what appears to be a large appendiceal abscess, and possible enterocolic fistula between the abscess and the patient's distal rectum, as detailed above. Surgical consultation is strongly recommended. 2. Aortic atherosclerosis. These results were called by telephone at  the time of interpretation on 01/26/2017 at 5:32 pm to Dr. Isla Pence, who verbally acknowledged these results. Aortic Atherosclerosis (ICD10-I70.0). Electronically Signed   By: Vinnie Langton M.D.   On: 01/26/2017 17:34   Ct Abdomen Pelvis W Contrast  Result Date: 01/24/2017 CLINICAL DATA:  Abdominal pain. Recent abdominal CT demonstrating distal ileal inflammation and dilated appendix. Persistent pain. Ran out of pain medication. EXAM: CT ABDOMEN AND PELVIS WITH CONTRAST TECHNIQUE: Multidetector CT imaging of the abdomen and pelvis was performed using the standard protocol following bolus  administration of intravenous contrast. CONTRAST:  167m ISOVUE-300 IOPAMIDOL (ISOVUE-300) INJECTION 61% COMPARISON:  CT 5 days prior 01/19/2017 FINDINGS: Lower chest: The lung bases are clear. Hepatobiliary: No focal hepatic lesion. Liver is prominent size spanning 19 cm cranial caudal. Gallbladder physiologically distended, no calcified stone. No biliary dilatation. Pancreas: No ductal dilatation or inflammation. Spleen: Normal in size without focal abnormality. Adrenals/Urinary Tract: Normal adrenal glands. No hydronephrosis or perinephric edema. Homogeneous renal enhancement with symmetric excretion on delayed phase imaging. Urinary bladder is physiologically distended. No bladder wall thickening. Stomach/Bowel: Note: bowel evaluation is suboptimal given lack of enteric contrast and paucity of intra-abdominal fat. Progressive inflammatory changes in the right lower quadrant from prior exam. Fluid-filled structure in the right lower quadrant with enhancement, thick wall and surrounding soft tissue inflammation (axial images 49 through 57) favored to represent edematous inflamed terminal ileum, however bowel anatomy is suboptimally defined. Differential considerations include abscess. Fecalization of adjacent distal ileal bowel loops. There is wall thickening of a send colon and likely cecum which is not well defined. The appendix is not confidently visualized on the current exam. Mild sigmoid colonic wall thickening is likely reactive. Stomach is nondistended. No small bowel dilatation to suggest obstruction or ileus. Vascular/Lymphatic: Aortic and branch atherosclerosis. Prominent reactive appearing ileocolic nodes. Portal vein is patent. Reproductive: Prostatic calcifications. Other: Right lower quadrant soft tissue stranding and mesenteric edema related to bowel inflammation. The fluid-filled right lower quadrant structure with thick wall and surrounding soft tissue inflammation favored to represent terminal  ileum, with differential consideration of abscess. There is otherwise no evidence of intra-abdominal/ pelvic fluid collection. No free air. Musculoskeletal: There are no acute or suspicious osseous abnormalities. IMPRESSION: 1. Worsening inflammatory process in the right lower quadrant over the past 5 days. An inflamed fluid-filled tubular structure is favored to represent progressive terminal ileum disease with increased surrounding soft tissue inflammation. Given location, Crohn's disease is suspected, however infectious ileitis is also considered. The differential is broad and includes abscess secondary to perforated appendix, as the appendix is not definitively visualized. Meckel's diverticulitis is also considered but felt less likely. There is progressive wall thickening of the ascending colon which may be primary bowel inflammation or reactive. Additionally there is sigmoid colonic wall thickening which is likely reactive. 2. Right lower quadrant bowel anatomy is not well-defined given lack of enteric contrast. Consider follow-up CT with full oral contrast prep and IV contrast. GI consultation may be of value. 3. Incidental aortic atherosclerosis. Aortic Atherosclerosis (ICD10-I70.0). These results were called by telephone at the time of interpretation on 01/24/2017 at 7:59 pm to [place], who verbally acknowledged these results. Electronically Signed   By: MJeb LeveringM.D.   On: 01/24/2017 20:04    Procedures Procedures (including critical care time)  Medications Ordered in ED Medications  iopamidol (ISOVUE-300) 61 % injection (not administered)  enoxaparin (LOVENOX) injection 40 mg (not administered)  dextrose 5 % and 0.45 % NaCl with  KCl 20 mEq/L infusion (not administered)  piperacillin-tazobactam (ZOSYN) IVPB 3.375 g (not administered)  acetaminophen (TYLENOL) tablet 650 mg (not administered)    Or  acetaminophen (TYLENOL) suppository 650 mg (not administered)    HYDROcodone-acetaminophen (NORCO/VICODIN) 5-325 MG per tablet 1-2 tablet (not administered)  HYDROmorphone (DILAUDID) injection 1 mg (not administered)  ondansetron (ZOFRAN-ODT) disintegrating tablet 4 mg (not administered)    Or  ondansetron (ZOFRAN) injection 4 mg (not administered)  sodium chloride 0.9 % bolus 1,000 mL (1,000 mLs Intravenous New Bag/Given 01/26/17 1535)  piperacillin-tazobactam (ZOSYN) IVPB 3.375 g (0 g Intravenous Stopped 01/26/17 1606)  ondansetron (ZOFRAN) injection 4 mg (4 mg Intravenous Given 01/26/17 1535)  morphine 4 MG/ML injection 4 mg (4 mg Intravenous Given 01/26/17 1535)  iopamidol (ISOVUE-300) 61 % injection 100 mL (100 mLs Intravenous Contrast Given 01/26/17 1626)     Initial Impression / Assessment and Plan / ED Course  I have reviewed the triage vital signs and the nursing notes.  Pertinent labs & imaging results that were available during my care of the patient were reviewed by me and considered in my medical decision making (see chart for details).     Pt d/w Dr. Harlow Asa (surgery) who will see pt and admit to the hospital.   Final Clinical Impressions(s) / ED Diagnoses   Final diagnoses:  Right lower quadrant abdominal pain  Intra-abdominal abscess [facility])    New Prescriptions New Prescriptions   No medications on file     Isla Pence, MD 01/26/17 [phone]

## 2017-01-26 NOTE — ED Notes (Signed)
Surgeon at bedside.  

## 2017-01-26 NOTE — ED Notes (Signed)
Pt asking for a meal. Pt made aware that he is NPO d/t surgery consult. Pt verbalized understanding.

## 2017-01-26 NOTE — Progress Notes (Signed)
General Surgery [facility]  Assessment & Plan: Right lower quadrant phlegmon - rule out Crohn's disease, perforated appendicitis, colitis  Patient returns to ER after signing out AMA - see H&P by Dr. Zella Richer  IV Zosyn started in ER - will continue  Clear liquid diet  Pain Rx, nausea Rx  IV hydration  Will request GI consultation this admission - evaluate for possible IBD  May require IR drainage - patient does not want surgical intervention        Earnstine Regal, MD, [facility]       Office: [phone]    Chief Complaint: Abdominal pain  Subjective: Patient in ER room, abdominal pain.  Some nausea, but wants solid food to eat.  Objective: Vital signs in last 24 hours: Temp:  [98.4 F (36.9 C)] 98.4 F (36.9 C) (09/08 1232) Pulse Rate:  [83-94] 83 (09/08 1650) Resp:  [18] 18 (09/08 1650) BP: (111-116)/(63-72) 116/63 (09/08 1650) SpO2:  [99 %-100 %] 99 % (09/08 1650) Weight:  [61.2 kg (135 lb)] 61.2 kg (135 lb) (09/08 1232)    Intake/Output from previous day: No intake/output data recorded. Intake/Output this shift: No intake/output data recorded.  Physical Exam: HEENT - sclerae clear, mucous membranes moist Neck - soft Chest - clear bilaterally Cor - RRR Abdomen - soft with minimal distension; BS present; tender peri-umbilical, mild; minimal RLQ tenderness, no mass Ext - no edema, non-tender Neuro - alert & oriented, no focal deficits  Lab Results:   Recent Labs  01/24/17 1535 01/26/17 1319  WBC 13.7* 14.1*  HGB 15.2 14.8  HCT 44.4 43.3  PLT 522* 601*   BMET  Recent Labs  01/24/17 1535 01/26/17 1319  NA 136 140  K 3.3* 4.1  CL 95* 103  CO2 29 28  GLUCOSE 102* 111*  BUN 8 6  CREATININE 0.91 0.87  CALCIUM 9.1 9.3   PT/INR No results for input(s): LABPROT, INR in the last 72 hours. Comprehensive Metabolic Panel:    Component Value Date/Time   NA 140 01/26/2017 1319   NA 136  01/24/2017 1535   K 4.1 01/26/2017 1319   K 3.3 (L) 01/24/2017 1535   CL 103 01/26/2017 1319   CL 95 (L) 01/24/2017 1535   CO2 28 01/26/2017 1319   CO2 29 01/24/2017 1535   BUN 6 01/26/2017 1319   BUN 8 01/24/2017 1535   CREATININE 0.87 01/26/2017 1319   CREATININE 0.91 01/24/2017 1535   GLUCOSE 111 (H) 01/26/2017 1319   GLUCOSE 102 (H) 01/24/2017 1535   CALCIUM 9.3 01/26/2017 1319   CALCIUM 9.1 01/24/2017 1535   AST 25 01/26/2017 1319   AST 33 01/24/2017 1535   ALT 22 01/26/2017 1319   ALT 21 01/24/2017 1535   ALKPHOS 84 01/26/2017 1319   ALKPHOS 81 01/24/2017 1535   BILITOT 0.6 01/26/2017 1319   BILITOT 0.5 01/24/2017 1535   PROT 7.4 01/26/2017 1319   PROT 7.0 01/24/2017 1535   ALBUMIN 3.3 (L) 01/26/2017 1319   ALBUMIN 3.2 (L) 01/24/2017 1535    Studies/Results: Ct Abdomen Pelvis W Contrast  Result Date: 01/24/2017 CLINICAL DATA:  Abdominal pain. Recent abdominal CT demonstrating distal ileal inflammation and dilated appendix. Persistent pain. Ran out of pain medication. EXAM: CT ABDOMEN AND PELVIS WITH CONTRAST TECHNIQUE: Multidetector CT imaging of the abdomen and pelvis was performed using the standard protocol following bolus administration of intravenous contrast. CONTRAST:  161m ISOVUE-300 IOPAMIDOL (  ISOVUE-300) INJECTION 61% COMPARISON:  CT 5 days prior 01/19/2017 FINDINGS: Lower chest: The lung bases are clear. Hepatobiliary: No focal hepatic lesion. Liver is prominent size spanning 19 cm cranial caudal. Gallbladder physiologically distended, no calcified stone. No biliary dilatation. Pancreas: No ductal dilatation or inflammation. Spleen: Normal in size without focal abnormality. Adrenals/Urinary Tract: Normal adrenal glands. No hydronephrosis or perinephric edema. Homogeneous renal enhancement with symmetric excretion on delayed phase imaging. Urinary bladder is physiologically distended. No bladder wall thickening. Stomach/Bowel: Note: bowel evaluation is suboptimal given  lack of enteric contrast and paucity of intra-abdominal fat. Progressive inflammatory changes in the right lower quadrant from prior exam. Fluid-filled structure in the right lower quadrant with enhancement, thick wall and surrounding soft tissue inflammation (axial images 49 through 57) favored to represent edematous inflamed terminal ileum, however bowel anatomy is suboptimally defined. Differential considerations include abscess. Fecalization of adjacent distal ileal bowel loops. There is wall thickening of a send colon and likely cecum which is not well defined. The appendix is not confidently visualized on the current exam. Mild sigmoid colonic wall thickening is likely reactive. Stomach is nondistended. No small bowel dilatation to suggest obstruction or ileus. Vascular/Lymphatic: Aortic and branch atherosclerosis. Prominent reactive appearing ileocolic nodes. Portal vein is patent. Reproductive: Prostatic calcifications. Other: Right lower quadrant soft tissue stranding and mesenteric edema related to bowel inflammation. The fluid-filled right lower quadrant structure with thick wall and surrounding soft tissue inflammation favored to represent terminal ileum, with differential consideration of abscess. There is otherwise no evidence of intra-abdominal/ pelvic fluid collection. No free air. Musculoskeletal: There are no acute or suspicious osseous abnormalities. IMPRESSION: 1. Worsening inflammatory process in the right lower quadrant over the past 5 days. An inflamed fluid-filled tubular structure is favored to represent progressive terminal ileum disease with increased surrounding soft tissue inflammation. Given location, Crohn's disease is suspected, however infectious ileitis is also considered. The differential is broad and includes abscess secondary to perforated appendix, as the appendix is not definitively visualized. Meckel's diverticulitis is also considered but felt less likely. There is  progressive wall thickening of the ascending colon which may be primary bowel inflammation or reactive. Additionally there is sigmoid colonic wall thickening which is likely reactive. 2. Right lower quadrant bowel anatomy is not well-defined given lack of enteric contrast. Consider follow-up CT with full oral contrast prep and IV contrast. GI consultation may be of value. 3. Incidental aortic atherosclerosis. Aortic Atherosclerosis (ICD10-I70.0). These results were called by telephone at the time of interpretation on 01/24/2017 at 7:59 pm to [place] , who verbally acknowledged these results. Electronically Signed   By: Jeb Levering M.D.   On: 01/24/2017 20:04      Andreu Drudge M 01/26/2017  Patient ID: Derek Lloyd, male   DOB: Jun 17, 1970, 46 y.o.   MRN: [id number]

## 2017-01-27 ENCOUNTER — Encounter (HOSPITAL_COMMUNITY): Payer: Self-pay | Admitting: *Deleted

## 2017-01-27 LAB — HIV ANTIBODY (ROUTINE TESTING W REFLEX): HIV SCREEN 4TH GENERATION: NONREACTIVE

## 2017-01-27 MED ORDER — CHLORHEXIDINE GLUCONATE 0.12 % MT SOLN
15.0000 mL | Freq: Two times a day (BID) | OROMUCOSAL | Status: DC
  Administered 2017-01-27 – 2017-01-28 (×4): 15 mL via OROMUCOSAL
  Filled 2017-01-27 (×3): qty 15

## 2017-01-27 MED ORDER — ORAL CARE MOUTH RINSE
15.0000 mL | Freq: Two times a day (BID) | OROMUCOSAL | Status: DC
  Administered 2017-01-28: 15 mL via OROMUCOSAL

## 2017-01-27 NOTE — Progress Notes (Signed)
General Surgery [facility]  Assessment & Plan: Right lower quadrant phlegmon - likely perforated appendicitis, rule out IBD  IV Zosyn - WBC improved to 12K, afebrile             Clear liquid diet             Pain Rx, nausea Rx             IV hydration             May require IR drainage - will consult IR on Monday AM  Consider GI consultation on Monday AM        Derek Regal, MD, [facility]       Office: [phone]    Chief Complaint: Right lower quadrant pain, probable perforated appendicitis  Subjective: Patient sleeping - awakens easily.  Tolerating clear liquids.  No pain this AM.  Objective: Vital signs in last 24 hours: Temp:  [98 F (36.7 C)-98.8 F (37.1 C)] 98.2 F (36.8 C) (09/09 0605) Pulse Rate:  [80-99] 84 (09/09 0605) Resp:  [18-20] 20 (09/09 0605) BP: (101-116)/(62-72) 108/70 (09/09 0605) SpO2:  [98 %-100 %] 99 % (09/09 0605) Weight:  [61.2 kg (135 lb)] 61.2 kg (135 lb) (09/08 1232) Last BM Date:  (PTA)  Intake/Output from previous day: 09/08 0701 - 09/09 0700 In: 988.3 [P.O.:222; I.V.:716.3; IV Piggyback:50] Out: -  Intake/Output this shift: No intake/output data recorded.  Physical Exam: HEENT - sclerae clear, mucous membranes moist Neck - soft Chest - clear bilaterally Cor - RRR Abdomen - soft without distension; minimal tenderness periumbilical, no mass, no guarding Ext - no edema, non-tender Neuro - alert & oriented, no focal deficits  Lab Results:   Recent Labs  01/26/17 1319 01/26/17 1952  WBC 14.1* 12.6*  HGB 14.8 13.4  HCT 43.3 40.5  PLT 601* 559*   BMET  Recent Labs  01/24/17 1535 01/26/17 1319 01/26/17 1952  NA 136 140  --   K 3.3* 4.1  --   CL 95* 103  --   CO2 29 28  --   GLUCOSE 102* 111*  --   BUN 8 6  --   CREATININE 0.91 0.87 0.91  CALCIUM 9.1 9.3  --    PT/INR No results for input(s): LABPROT, INR in the last 72 hours. Comprehensive Metabolic  Panel:    Component Value Date/Time   NA 140 01/26/2017 1319   NA 136 01/24/2017 1535   K 4.1 01/26/2017 1319   K 3.3 (L) 01/24/2017 1535   CL 103 01/26/2017 1319   CL 95 (L) 01/24/2017 1535   CO2 28 01/26/2017 1319   CO2 29 01/24/2017 1535   BUN 6 01/26/2017 1319   BUN 8 01/24/2017 1535   CREATININE 0.91 01/26/2017 1952   CREATININE 0.87 01/26/2017 1319   GLUCOSE 111 (H) 01/26/2017 1319   GLUCOSE 102 (H) 01/24/2017 1535   CALCIUM 9.3 01/26/2017 1319   CALCIUM 9.1 01/24/2017 1535   AST 25 01/26/2017 1319   AST 33 01/24/2017 1535   ALT 22 01/26/2017 1319   ALT 21 01/24/2017 1535   ALKPHOS 84 01/26/2017 1319   ALKPHOS 81 01/24/2017 1535   BILITOT 0.6 01/26/2017 1319   BILITOT 0.5 01/24/2017 1535   PROT 7.4 01/26/2017 1319   PROT 7.0 01/24/2017 1535   ALBUMIN 3.3 (L) 01/26/2017 1319   ALBUMIN 3.2 (L) 01/24/2017 1535    Studies/Results: Ct  Abdomen Pelvis W Contrast  Result Date: 01/26/2017 CLINICAL DATA:  46 year old male with ongoing abdominal pain. EXAM: CT ABDOMEN AND PELVIS WITH CONTRAST TECHNIQUE: Multidetector CT imaging of the abdomen and pelvis was performed using the standard protocol following bolus administration of intravenous contrast. CONTRAST:  184m ISOVUE-300 IOPAMIDOL (ISOVUE-300) INJECTION 61% COMPARISON:  CT the abdomen and pelvis 01/24/2017. FINDINGS: Lower chest: Unremarkable. Hepatobiliary: No cystic or solid hepatic lesions. No intra or extrahepatic biliary ductal dilatation. Gallbladder is normal in appearance. Pancreas: No pancreatic mass. No pancreatic ductal dilatation. No pancreatic or peripancreatic fluid or inflammatory changes. Spleen: Unremarkable. Adrenals/Urinary Tract: Bilateral kidneys and bilateral adrenal glands are normal in appearance. There is no hydroureteronephrosis. Urinary bladder is normal in appearance. Stomach/Bowel: The appearance of the stomach is normal. No pathologic dilatation of small bowel or colon. Profound thickening of the  terminal ileum is again noted with extensive surrounding inflammatory changes. There is again a dilated fluid-filled structure with rim enhancement in the right lower quadrant which is coincident in location to the dilated appendix seen on original CT of the abdomen and pelvis 01/19/2017, strongly favored to represent a large appendiceal abscess. This currently measures approximately 6.3 x 3.3 x 2.7 cm (axial image 54 of series 2 and coronal image 43 of series 4), with thick rim of enhancement and extensive surrounding inflammatory changes. Some abnormal soft tissue is seen extending caudally from the appendiceal abscess tracking into the low anatomic pelvis ultimately coming in contact with the distal rectum (axial images 60-66 of series 2 and coronal images 52-82 of series 4), concerning for potential enterocolic fistula. Vascular/Lymphatic: Aortic atherosclerosis, without evidence of aneurysm or dissection in the abdominal or pelvic vasculature. Multiple prominent borderline enlarged ileocolic lymph nodes are noted, presumably reactive. No other lymphadenopathy is noted elsewhere in the abdomen or pelvis. Reproductive: Prostate gland and seminal vesicles are unremarkable in appearance. Other: Trace volume of reactive free fluid in the lower abdomen and anatomic pelvis. No pneumoperitoneum. Musculoskeletal: There are no aggressive appearing lytic or blastic lesions noted in the visualized portions of the skeleton. IMPRESSION: 1. Persistent changes of severe ileitis with what appears to be a large appendiceal abscess, and possible enterocolic fistula between the abscess and the patient's distal rectum, as detailed above. Surgical consultation is strongly recommended. 2. Aortic atherosclerosis. These results were called by telephone at the time of interpretation on 01/26/2017 at 5:32 pm to Dr. JIsla Pence who verbally acknowledged these results. Aortic Atherosclerosis (ICD10-I70.0). Electronically Signed   By:  DVinnie LangtonM.D.   On: 01/26/2017 17:34      GFort MitchellM 01/27/2017  Patient ID: DLouie Lloyd male   DOB: 301/15/1972 46y.o.   MRN: [id number]

## 2017-01-28 NOTE — Consult Note (Signed)
Chief Complaint: Patient was seen in consultation today for CT-guided pelvic abscess drainage Chief Complaint  Patient presents with  . Abdominal Pain    Referring Physician(s): Martin,M  Supervising Physician: Sandi Mariscal  Patient Status: [facility] - In-pt  History of Present Illness: Derek Lloyd is a 46 y.o. male smoker with history of persistent lower abdominal pain, leukocytosis and recent imaging which has revealed:  1. Persistent changes of severe ileitis with what appears to be a large appendiceal abscess, and possible enterocolic fistula between the abscess and the patient's distal rectum, as detailed above. Surgical consultation is strongly recommended. 2. Aortic atherosclerosis  Request now received from surgery for CT-guided aspiration/possible drainage of the abscess.He is currently afebrile and on IV antibiotic therapy.  History reviewed. No pertinent past medical history.  History reviewed. No pertinent surgical history.  Allergies: Asa [aspirin]  Medications: Prior to Admission medications   Medication Sig Start Date End Date Taking? Authorizing Provider  acetaminophen (TYLENOL) 500 MG tablet Take 500 mg by mouth every 6 (six) hours as needed.   Yes [provider]  ciprofloxacin (CIPRO) 250 MG tablet Take 1 tablet (250 mg total) by mouth every 12 (twelve) hours. 01/19/17  Yes Sherwood Gambler, MD  HYDROcodone-acetaminophen (NORCO) 5-325 MG tablet Take 1-2 tablets by mouth every 4 (four) hours as needed for severe pain. 01/19/17  Yes Sherwood Gambler, MD  metroNIDAZOLE (FLAGYL) 500 MG tablet Take 1 tablet (500 mg total) by mouth 2 (two) times daily. One po bid x 7 days 01/19/17  Yes Sherwood Gambler, MD  ondansetron (ZOFRAN ODT) 4 MG disintegrating tablet Take 1 tablet (4 mg total) by mouth every 8 (eight) hours as needed for nausea or vomiting. 01/19/17  Yes Sherwood Gambler, MD     History reviewed. No pertinent family history.  Social History   Social  History  . Marital status: Married    Spouse name: N/A  . Number of children: N/A  . Years of education: N/A   Social History Main Topics  . Smoking status: Current Every Day Smoker  . Smokeless tobacco: None  . Alcohol use Yes  . Drug use: Unknown  . Sexual activity: Not Asked   Other Topics Concern  . None   Social History Narrative  . None      Review of Systems currently denies fever, headache, chest pain, dyspnea, cough, back pain, nausea, vomiting or bleeding.  Vital Signs: BP 108/72 (BP Location: Right Arm)   Pulse 78   Temp 98.2 F (36.8 C) (Oral)   Resp 16   Ht 5' 10"  (1.778 m)   Wt 135 lb (61.2 kg)   SpO2 99%   BMI 19.37 kg/m   Physical Exam awake, alert. Chest clear to auscultation bilaterally. Heart with regular rate and rhythm. Abdomen soft, positive bowel sounds, periumbilical tenderness noted; No lower extremity edema.  Mallampati Score:     Imaging: Dg Chest 2 View  Result Date: 01/19/2017 CLINICAL DATA:  Upper abdomen pressure EXAM: CHEST  2 VIEW COMPARISON:  January 03, 2006 FINDINGS: The heart size and mediastinal contours are within normal limits. Both lungs are clear. The visualized skeletal structures are unremarkable. IMPRESSION: No active cardiopulmonary disease. Electronically Signed   By: Abelardo Diesel M.D.   On: 01/19/2017 17:54   Ct Abdomen Pelvis W Contrast  Result Date: 01/26/2017 CLINICAL DATA:  46 year old male with ongoing abdominal pain. EXAM: CT ABDOMEN AND PELVIS WITH CONTRAST TECHNIQUE: Multidetector CT imaging of the abdomen and pelvis was  performed using the standard protocol following bolus administration of intravenous contrast. CONTRAST:  131m ISOVUE-300 IOPAMIDOL (ISOVUE-300) INJECTION 61% COMPARISON:  CT the abdomen and pelvis 01/24/2017. FINDINGS: Lower chest: Unremarkable. Hepatobiliary: No cystic or solid hepatic lesions. No intra or extrahepatic biliary ductal dilatation. Gallbladder is normal in appearance. Pancreas: No  pancreatic mass. No pancreatic ductal dilatation. No pancreatic or peripancreatic fluid or inflammatory changes. Spleen: Unremarkable. Adrenals/Urinary Tract: Bilateral kidneys and bilateral adrenal glands are normal in appearance. There is no hydroureteronephrosis. Urinary bladder is normal in appearance. Stomach/Bowel: The appearance of the stomach is normal. No pathologic dilatation of small bowel or colon. Profound thickening of the terminal ileum is again noted with extensive surrounding inflammatory changes. There is again a dilated fluid-filled structure with rim enhancement in the right lower quadrant which is coincident in location to the dilated appendix seen on original CT of the abdomen and pelvis 01/19/2017, strongly favored to represent a large appendiceal abscess. This currently measures approximately 6.3 x 3.3 x 2.7 cm (axial image 54 of series 2 and coronal image 43 of series 4), with thick rim of enhancement and extensive surrounding inflammatory changes. Some abnormal soft tissue is seen extending caudally from the appendiceal abscess tracking into the low anatomic pelvis ultimately coming in contact with the distal rectum (axial images 60-66 of series 2 and coronal images 52-82 of series 4), concerning for potential enterocolic fistula. Vascular/Lymphatic: Aortic atherosclerosis, without evidence of aneurysm or dissection in the abdominal or pelvic vasculature. Multiple prominent borderline enlarged ileocolic lymph nodes are noted, presumably reactive. No other lymphadenopathy is noted elsewhere in the abdomen or pelvis. Reproductive: Prostate gland and seminal vesicles are unremarkable in appearance. Other: Trace volume of reactive free fluid in the lower abdomen and anatomic pelvis. No pneumoperitoneum. Musculoskeletal: There are no aggressive appearing lytic or blastic lesions noted in the visualized portions of the skeleton. IMPRESSION: 1. Persistent changes of severe ileitis with what  appears to be a large appendiceal abscess, and possible enterocolic fistula between the abscess and the patient's distal rectum, as detailed above. Surgical consultation is strongly recommended. 2. Aortic atherosclerosis. These results were called by telephone at the time of interpretation on 01/26/2017 at 5:32 pm to Dr. JIsla Pence who verbally acknowledged these results. Aortic Atherosclerosis (ICD10-I70.0). Electronically Signed   By: DVinnie LangtonM.D.   On: 01/26/2017 17:34   Ct Abdomen Pelvis W Contrast  Result Date: 01/24/2017 CLINICAL DATA:  Abdominal pain. Recent abdominal CT demonstrating distal ileal inflammation and dilated appendix. Persistent pain. Ran out of pain medication. EXAM: CT ABDOMEN AND PELVIS WITH CONTRAST TECHNIQUE: Multidetector CT imaging of the abdomen and pelvis was performed using the standard protocol following bolus administration of intravenous contrast. CONTRAST:  1065mISOVUE-300 IOPAMIDOL (ISOVUE-300) INJECTION 61% COMPARISON:  CT 5 days prior 01/19/2017 FINDINGS: Lower chest: The lung bases are clear. Hepatobiliary: No focal hepatic lesion. Liver is prominent size spanning 19 cm cranial caudal. Gallbladder physiologically distended, no calcified stone. No biliary dilatation. Pancreas: No ductal dilatation or inflammation. Spleen: Normal in size without focal abnormality. Adrenals/Urinary Tract: Normal adrenal glands. No hydronephrosis or perinephric edema. Homogeneous renal enhancement with symmetric excretion on delayed phase imaging. Urinary bladder is physiologically distended. No bladder wall thickening. Stomach/Bowel: Note: bowel evaluation is suboptimal given lack of enteric contrast and paucity of intra-abdominal fat. Progressive inflammatory changes in the right lower quadrant from prior exam. Fluid-filled structure in the right lower quadrant with enhancement, thick wall and surrounding soft tissue inflammation (axial images 49 through 57)  favored to represent  edematous inflamed terminal ileum, however bowel anatomy is suboptimally defined. Differential considerations include abscess. Fecalization of adjacent distal ileal bowel loops. There is wall thickening of a send colon and likely cecum which is not well defined. The appendix is not confidently visualized on the current exam. Mild sigmoid colonic wall thickening is likely reactive. Stomach is nondistended. No small bowel dilatation to suggest obstruction or ileus. Vascular/Lymphatic: Aortic and branch atherosclerosis. Prominent reactive appearing ileocolic nodes. Portal vein is patent. Reproductive: Prostatic calcifications. Other: Right lower quadrant soft tissue stranding and mesenteric edema related to bowel inflammation. The fluid-filled right lower quadrant structure with thick wall and surrounding soft tissue inflammation favored to represent terminal ileum, with differential consideration of abscess. There is otherwise no evidence of intra-abdominal/ pelvic fluid collection. No free air. Musculoskeletal: There are no acute or suspicious osseous abnormalities. IMPRESSION: 1. Worsening inflammatory process in the right lower quadrant over the past 5 days. An inflamed fluid-filled tubular structure is favored to represent progressive terminal ileum disease with increased surrounding soft tissue inflammation. Given location, Crohn's disease is suspected, however infectious ileitis is also considered. The differential is broad and includes abscess secondary to perforated appendix, as the appendix is not definitively visualized. Meckel's diverticulitis is also considered but felt less likely. There is progressive wall thickening of the ascending colon which may be primary bowel inflammation or reactive. Additionally there is sigmoid colonic wall thickening which is likely reactive. 2. Right lower quadrant bowel anatomy is not well-defined given lack of enteric contrast. Consider follow-up CT with full oral contrast  prep and IV contrast. GI consultation may be of value. 3. Incidental aortic atherosclerosis. Aortic Atherosclerosis (ICD10-I70.0). These results were called by telephone at the time of interpretation on 01/24/2017 at 7:59 pm to [place] , who verbally acknowledged these results. Electronically Signed   By: Jeb Levering M.D.   On: 01/24/2017 20:04   Ct Abdomen Pelvis W Contrast  Result Date: 01/19/2017 CLINICAL DATA:  Abdominal pain accompanied with nausea, vomiting and constipation.No acute abnormality. EXAM: CT ABDOMEN AND PELVIS WITH CONTRAST TECHNIQUE: Multidetector CT imaging of the abdomen and pelvis was performed using the standard protocol following bolus administration of intravenous contrast. CONTRAST:  100 cc Isovue-300 COMPARISON:  None. FINDINGS: Lower chest: No acute abnormality. Normal size heart. No pericardial effusion. Bibasilar atelectasis. Hepatobiliary: No focal liver abnormality is seen. No gallstones, gallbladder wall thickening, or biliary dilatation. Pancreas: Unremarkable. No pancreatic ductal dilatation or surrounding inflammatory changes. Spleen: Normal in size without focal abnormality. Adrenals/Urinary Tract: Adrenal glands are unremarkable. Kidneys are normal, without renal calculi, focal lesion, or hydronephrosis. Bladder is unremarkable. Stomach/Bowel: Moderate transmural thickening of distal and terminal ileum with more proximal fluid-filled distention of jejunal and ileal loops of small intestine. Diarrheal disease suspected within the colon with liquid stool present to the level of the rectum. Abnormal thickening and distention of the appendix to 13 mm in diameter is also noted. Differential possibilities may include sympathetic thickening of the appendix due to adjacent ileitis although a concomitant appendicitis cannot entirely excluded or sympathetic thickening of the ileum due to an appendicitis. Vascular/Lymphatic: Aortoiliac atherosclerosis. No lymphadenopathy  by CT size criteria. Reproductive: Central zone calcifications are noted within prostate which is mildly enlarged. Partially imaged small hydroceles within the included scrotum. Other: No abdominal wall hernia or abnormality. No abdominopelvic ascites. Musculoskeletal: No acute or significant osseous findings. IMPRESSION: 1. Segmental moderate mural thickening of distal and terminal ileum consistent with an ileitis. No bowel  obstruction is noted. More proximal fluid-filled distention of nonthickened jejunum and proximal ileum are identified which may reflect an enteritis. Liquid stool is seen within the colon consistent with diarrheal disease. 2. Confounding findings of a distended thickened appendix, measuring up to 12 mm also in the right lower quadrant adjacent to the inflamed distal ileum. Findings could be due to sympathetic thickening of the appendix due to adjacent ileitis or vice versa, favor an ileitis with sympathetic thickening of the appendix given the more segmental moderate thickened appearance of the ileum. 3. Small hydroceles are partially imaged of the included scrotum. Electronically Signed   By: Ashley Royalty M.D.   On: 01/19/2017 20:45    Labs:  CBC:  Recent Labs  01/19/17 1759 01/24/17 1535 01/26/17 1319 01/26/17 1952  WBC 16.0* 13.7* 14.1* 12.6*  HGB 16.9 15.2 14.8 13.4  HCT 47.6 44.4 43.3 40.5  PLT 307 522* 601* 559*    COAGS: No results for input(s): INR, APTT in the last 8760 hours.  BMP:  Recent Labs  01/19/17 1759 01/24/17 1535 01/26/17 1319 01/26/17 1952  NA 139 136 140  --   K 4.4 3.3* 4.1  --   CL 98* 95* 103  --   CO2 27 29 28   --   GLUCOSE 103* 102* 111*  --   BUN 15 8 6   --   CALCIUM 9.6 9.1 9.3  --   CREATININE 0.99 0.91 0.87 0.91  GFRNONAA >60 >60 >60 >60  GFRAA >60 >60 >60 >60    LIVER FUNCTION TESTS:  Recent Labs  01/19/17 1759 01/24/17 1535 01/26/17 1319  BILITOT 2.1* 0.5 0.6  AST 19 33 25  ALT 14* 21 22  ALKPHOS 79 81 84  PROT  8.0 7.0 7.4  ALBUMIN 3.6 3.2* 3.3*    TUMOR MARKERS: No results for input(s): AFPTM, CEA, CA199, CHROMGRNA in the last 8760 hours.  Assessment and Plan: 46 y.o. male smoker with history of persistent lower abdominal pain, leukocytosis and recent imaging which has revealed:  1. Persistent changes of severe ileitis with what appears to be a large appendiceal abscess, and possible enterocolic fistula between the abscess and the patient's distal rectum, as detailed above. Surgical consultation is strongly recommended. 2. Aortic atherosclerosis  Request now received from surgery for CT-guided aspiration/possible drainage of the abscess.He is currently afebrile and on IV antibiotic therapy.Imaging studies have been reviewed by Dr. Kathlene Cote. Plan at this time is to repeat CT of the abdomen and pelvis tomorrow and if there is an accessible window proceed with drain placement. Details/risks of procedure, including but not limited to, internal bleeding, infection, injury to adjacent structures, inability to place drain discussed with patient with his understanding and consent.    Thank you for this interesting consult.  I greatly enjoyed meeting Derek Lloyd and look forward to participating in their care.  A copy of this report was sent to the requesting provider on this date.  Electronically Signed: D. Rowe Robert, PA-C 01/28/2017, 3:27 PM   I spent a total of  25 minutes   in face to face in clinical consultation, greater than 50% of which was counseling/coordinating care for pelvic abscess drainage

## 2017-01-28 NOTE — Progress Notes (Signed)
Pt very frustrated because he can't eat solid foods and procedure has been canceled to have drain placed. He is getting emails from teachers saying they are going to drop him from classes for missing so many. Pt has a lot of anxiety about the drain being placed and because it's been delayed,and also the hospital bill. Derek Lloyd was in to speak to pt and explained the procedure of the drain, why the infection might have occurred. Pt seemed much more relaxed after talking to [place].

## 2017-01-28 NOTE — Progress Notes (Signed)
Patient ID: Derek Lloyd, male   DOB: [birth year], 46 y.o.   MRN: [id number] [facility] Progress Note:   * No surgery found *  Subjective: Mental status is alert and full of questions Objective: Vital signs in last 24 hours: Temp:  [97.8 F (36.6 C)-98.7 F (37.1 C)] 98.2 F (36.8 C) (09/10 0558) Pulse Rate:  [82-94] 91 (09/10 0558) Resp:  [16-20] 16 (09/10 0558) BP: (96-114)/(60-82) 114/82 (09/10 0558) SpO2:  [100 %] 100 % (09/10 0558)  Intake/Output from previous day: 09/09 0701 - 09/10 0700 In: 1196.3 [P.O.:840; I.V.:306.3; IV Piggyback:50] Out: -  Intake/Output this shift: No intake/output data recorded.  Physical Exam: Work of breathing is normal.  Some pain in the right lower quadrant.    Lab Results:  Results for orders placed or performed during the hospital encounter of 01/26/17 (from the past 48 hour(s))  Lipase, blood     Status: None   Collection Time: 01/26/17  1:19 PM  Result Value Ref Range   Lipase 17 11 - 51 U/L  Comprehensive metabolic panel     Status: Abnormal   Collection Time: 01/26/17  1:19 PM  Result Value Ref Range   Sodium 140 135 - 145 mmol/L   Potassium 4.1 3.5 - 5.1 mmol/L    Comment: DELTA CHECK NOTED   Chloride 103 101 - 111 mmol/L   CO2 28 22 - 32 mmol/L   Glucose, Bld 111 (H) 65 - 99 mg/dL   BUN 6 6 - 20 mg/dL   Creatinine, Ser 0.87 0.61 - 1.24 mg/dL   Calcium 9.3 8.9 - 10.3 mg/dL   Total Protein 7.4 6.5 - 8.1 g/dL   Albumin 3.3 (L) 3.5 - 5.0 g/dL   AST 25 15 - 41 U/L   ALT 22 17 - 63 U/L   Alkaline Phosphatase 84 38 - 126 U/L   Total Bilirubin 0.6 0.3 - 1.2 mg/dL   GFR calc non Af Amer >60 >60 mL/min   GFR calc Af Amer >60 >60 mL/min    Comment: (NOTE) The eGFR has been calculated using the CKD EPI equation. This calculation has not been validated in all clinical situations. eGFR's persistently <60 mL/min signify possible Chronic Kidney Disease.    Anion gap 9 5 - 15  CBC     Status: Abnormal   Collection Time:  01/26/17  1:19 PM  Result Value Ref Range   WBC 14.1 (H) 4.0 - 10.5 K/uL   RBC 4.49 4.22 - 5.81 MIL/uL   Hemoglobin 14.8 13.0 - 17.0 g/dL   HCT 43.3 39.0 - 52.0 %   MCV 96.4 78.0 - 100.0 fL   MCH 33.0 26.0 - 34.0 pg   MCHC 34.2 30.0 - 36.0 g/dL   RDW 12.9 11.5 - 15.5 %   Platelets 601 (H) 150 - 400 K/uL  HIV antibody (Routine Testing)     Status: None   Collection Time: 01/26/17  7:52 PM  Result Value Ref Range   HIV Screen 4th Generation wRfx Non Reactive Non Reactive    Comment: (NOTE) Performed At: [facility] [place], [place] [id number] Lindon Romp MD [id number]   CBC     Status: Abnormal   Collection Time: 01/26/17  7:52 PM  Result Value Ref Range   WBC 12.6 (H) 4.0 - 10.5 K/uL   RBC 4.18 (L) 4.22 - 5.81 MIL/uL   Hemoglobin 13.4 13.0 - 17.0 g/dL   HCT 40.5 39.0 - 52.0 %  MCV 96.9 78.0 - 100.0 fL   MCH 32.1 26.0 - 34.0 pg   MCHC 33.1 30.0 - 36.0 g/dL   RDW 13.1 11.5 - 15.5 %   Platelets 559 (H) 150 - 400 K/uL  Creatinine, serum     Status: None   Collection Time: 01/26/17  7:52 PM  Result Value Ref Range   Creatinine, Ser 0.91 0.61 - 1.24 mg/dL   GFR calc non Af Amer >60 >60 mL/min   GFR calc Af Amer >60 >60 mL/min    Comment: (NOTE) The eGFR has been calculated using the CKD EPI equation. This calculation has not been validated in all clinical situations. eGFR's persistently <60 mL/min signify possible Chronic Kidney Disease.     Radiology/Results: Ct Abdomen Pelvis W Contrast  Result Date: 01/26/2017 CLINICAL DATA:  46 year old male with ongoing abdominal pain. EXAM: CT ABDOMEN AND PELVIS WITH CONTRAST TECHNIQUE: Multidetector CT imaging of the abdomen and pelvis was performed using the standard protocol following bolus administration of intravenous contrast. CONTRAST:  133m ISOVUE-300 IOPAMIDOL (ISOVUE-300) INJECTION 61% COMPARISON:  CT the abdomen and pelvis 01/24/2017. FINDINGS: Lower chest: Unremarkable. Hepatobiliary: No  cystic or solid hepatic lesions. No intra or extrahepatic biliary ductal dilatation. Gallbladder is normal in appearance. Pancreas: No pancreatic mass. No pancreatic ductal dilatation. No pancreatic or peripancreatic fluid or inflammatory changes. Spleen: Unremarkable. Adrenals/Urinary Tract: Bilateral kidneys and bilateral adrenal glands are normal in appearance. There is no hydroureteronephrosis. Urinary bladder is normal in appearance. Stomach/Bowel: The appearance of the stomach is normal. No pathologic dilatation of small bowel or colon. Profound thickening of the terminal ileum is again noted with extensive surrounding inflammatory changes. There is again a dilated fluid-filled structure with rim enhancement in the right lower quadrant which is coincident in location to the dilated appendix seen on original CT of the abdomen and pelvis 01/19/2017, strongly favored to represent a large appendiceal abscess. This currently measures approximately 6.3 x 3.3 x 2.7 cm (axial image 54 of series 2 and coronal image 43 of series 4), with thick rim of enhancement and extensive surrounding inflammatory changes. Some abnormal soft tissue is seen extending caudally from the appendiceal abscess tracking into the low anatomic pelvis ultimately coming in contact with the distal rectum (axial images 60-66 of series 2 and coronal images 52-82 of series 4), concerning for potential enterocolic fistula. Vascular/Lymphatic: Aortic atherosclerosis, without evidence of aneurysm or dissection in the abdominal or pelvic vasculature. Multiple prominent borderline enlarged ileocolic lymph nodes are noted, presumably reactive. No other lymphadenopathy is noted elsewhere in the abdomen or pelvis. Reproductive: Prostate gland and seminal vesicles are unremarkable in appearance. Other: Trace volume of reactive free fluid in the lower abdomen and anatomic pelvis. No pneumoperitoneum. Musculoskeletal: There are no aggressive appearing lytic or  blastic lesions noted in the visualized portions of the skeleton. IMPRESSION: 1. Persistent changes of severe ileitis with what appears to be a large appendiceal abscess, and possible enterocolic fistula between the abscess and the patient's distal rectum, as detailed above. Surgical consultation is strongly recommended. 2. Aortic atherosclerosis. These results were called by telephone at the time of interpretation on 01/26/2017 at 5:32 pm to Dr. JIsla Pence who verbally acknowledged these results. Aortic Atherosclerosis (ICD10-I70.0). Electronically Signed   By: DVinnie LangtonM.D.   On: 01/26/2017 17:34    Anti-infectives: Anti-infectives    Start     Dose/Rate Route Frequency Ordered Stop   01/26/17 2200  piperacillin-tazobactam (ZOSYN) IVPB 3.375 g     3.375 g  12.5 mL/hr over 240 Minutes Intravenous Every 8 hours 01/26/17 1726     01/26/17 1530  piperacillin-tazobactam (ZOSYN) IVPB 3.375 g     3.375 g 100 mL/hr over 30 Minutes Intravenous  Once 01/26/17 1516 01/26/17 1606      Assessment/Plan: Problem List: Patient Active Problem List   Diagnosis Date Noted  . Right lower quadrant abdominal abscess ([place]) 01/26/2017    Hopeful percutaneous drainage of RLQ abscess.  If not, would consider longer course of IV then oral antibiotcs and observation * No surgery found *    LOS: 2 days   Matt B. Hassell Done, MD, [facility] [phone] beeper [phone]  01/28/2017 12:50 PM

## 2017-01-28 NOTE — Care Management Note (Signed)
Case Management Note  Patient Details  Name: Kristjan Derner MRN: [id number] Date of Birth: [birth year]  Subjective/Objective:    46 yo admitted with RLQ abdominal abscess.                Action/Plan: Pt has [employer] and states he has no trouble obtain meds. Per 01/24/17 ED note pt was residing in a motel. CM will continue to follow and assist with dc needs.  Expected Discharge Date:   (unknown)               Expected Discharge Plan:  Home/Self Care  In-House Referral:  Clinical Social Work  Discharge planning Services  CM Consult  Post Acute Care Choice:    Choice offered to:     DME Arranged:    DME Agency:     HH Arranged:    HH Agency:     Status of Service:  In process, will continue to follow  If discussed at Long Length of Stay Meetings, dates discussed:    Additional CommentsLynnell Catalan, RN 01/28/2017, 2:40 PM  [phone]

## 2017-01-29 LAB — CBC WITH DIFFERENTIAL/PLATELET
BASOS ABS: 0 10*3/uL (ref 0.0–0.1)
BASOS PCT: 0 %
EOS ABS: 0.3 10*3/uL (ref 0.0–0.7)
Eosinophils Relative: 2 %
HCT: 40 % (ref 39.0–52.0)
Hemoglobin: 13.3 g/dL (ref 13.0–17.0)
Lymphocytes Relative: 13 %
Lymphs Abs: 1.6 10*3/uL (ref 0.7–4.0)
MCH: 32.4 pg (ref 26.0–34.0)
MCHC: 33.3 g/dL (ref 30.0–36.0)
MCV: 97.3 fL (ref 78.0–100.0)
MONO ABS: 1.5 10*3/uL — AB (ref 0.1–1.0)
MONOS PCT: 12 %
NEUTROS PCT: 73 %
Neutro Abs: 8.7 10*3/uL — ABNORMAL HIGH (ref 1.7–7.7)
Platelets: 654 10*3/uL — ABNORMAL HIGH (ref 150–400)
RBC: 4.11 MIL/uL — ABNORMAL LOW (ref 4.22–5.81)
RDW: 13 % (ref 11.5–15.5)
WBC: 12.1 10*3/uL — ABNORMAL HIGH (ref 4.0–10.5)

## 2017-01-29 LAB — COMPREHENSIVE METABOLIC PANEL
ALT: 41 U/L (ref 17–63)
ANION GAP: 9 (ref 5–15)
AST: 92 U/L — ABNORMAL HIGH (ref 15–41)
Albumin: 2.9 g/dL — ABNORMAL LOW (ref 3.5–5.0)
Alkaline Phosphatase: 521 U/L — ABNORMAL HIGH (ref 38–126)
BUN: <5 mg/dL — ABNORMAL LOW (ref 6–20)
CHLORIDE: 103 mmol/L (ref 101–111)
CO2: 26 mmol/L (ref 22–32)
Calcium: 8.6 mg/dL — ABNORMAL LOW (ref 8.9–10.3)
Creatinine, Ser: 0.96 mg/dL (ref 0.61–1.24)
Glucose, Bld: 88 mg/dL (ref 65–99)
POTASSIUM: 4 mmol/L (ref 3.5–5.1)
Sodium: 138 mmol/L (ref 135–145)
Total Bilirubin: 0.5 mg/dL (ref 0.3–1.2)
Total Protein: 6.6 g/dL (ref 6.5–8.1)

## 2017-01-29 LAB — PROTIME-INR
INR: 1.08
PROTHROMBIN TIME: 13.9 s (ref 11.4–15.2)

## 2017-01-29 MED ORDER — CIPROFLOXACIN HCL 250 MG PO TABS: 250.0000 mg | ORAL_TABLET | Freq: Two times a day (BID) | ORAL | 0 refills | Status: AC

## 2017-01-29 MED ORDER — METRONIDAZOLE 500 MG PO TABS: 500.0000 mg | ORAL_TABLET | Freq: Three times a day (TID) | ORAL | 0 refills | Status: AC

## 2017-01-29 NOTE — Progress Notes (Signed)
[facility] Progress Note     Subjective: CC: Upset. Threatening to leave AMA.  Patient [place] the streets for over a [place] and working hard to get into school. Reports now that he is in school he is "falling behind" by being in the hospital and feels he could get medical care and appropriate specialty referrals on campus.  I discussed his condition and showed him his CT scan in attempt to explain the indications for admission and percutaneous drainage of intra-abdominal infection but he was adamant about leaving.   Objective: Vital signs in last 24 hours: Temp:  [98.2 F (36.8 C)-98.4 F (36.9 C)] 98.3 F (36.8 C) (09/11 0850) Pulse Rate:  [76-89] 76 (09/11 0850) Resp:  [16-18] 18 (09/10 2025) BP: (108-115)/(72) 115/72 (09/10 2025) SpO2:  [98 %-99 %] 98 % (09/11 0850) Last BM Date: 01/26/17  Intake/Output from previous day: 09/10 0701 - 09/11 0700 In: 3252.8 [P.O.:954; I.V.:2148.8; IV Piggyback:150] Out: -  Intake/Output this shift: No intake/output data recorded.  PE: Gen:  Alert, NAD, upset Pulm:  Normal effort Abd: Soft, +BS, mild central, lower abdominal tenderness.  Skin: warm and dry, no rashes  Psych: A&Ox3   Lab Results:   Recent Labs  01/26/17 1952 01/29/17 0344  WBC 12.6* 12.1*  HGB 13.4 13.3  HCT 40.5 40.0  PLT 559* 654*   BMET  Recent Labs  01/26/17 1319 01/26/17 1952 01/29/17 0344  NA 140  --  138  K 4.1  --  4.0  CL 103  --  103  CO2 28  --  26  GLUCOSE 111*  --  88  BUN 6  --  <5*  CREATININE 0.87 0.91 0.96  CALCIUM 9.3  --  8.6*   PT/INR  Recent Labs  01/29/17 0344  LABPROT 13.9  INR 1.08   CMP     Component Value Date/Time   NA 138 01/29/2017 0344   K 4.0 01/29/2017 0344   CL 103 01/29/2017 0344   CO2 26 01/29/2017 0344   GLUCOSE 88 01/29/2017 0344   BUN <5 (L) 01/29/2017 0344   CREATININE 0.96 01/29/2017 0344   CALCIUM 8.6 (L) 01/29/2017 0344   PROT 6.6 01/29/2017 0344   ALBUMIN 2.9 (L)  01/29/2017 0344   AST 92 (H) 01/29/2017 0344   ALT 41 01/29/2017 0344   ALKPHOS 521 (H) 01/29/2017 0344   BILITOT 0.5 01/29/2017 0344   GFRNONAA >60 01/29/2017 0344   GFRAA >60 01/29/2017 0344   Lipase     Component Value Date/Time   LIPASE 17 01/26/2017 1319   Anti-infectives: Anti-infectives    Start     Dose/Rate Route Frequency Ordered Stop   01/26/17 2200  piperacillin-tazobactam (ZOSYN) IVPB 3.375 g     3.375 g 12.5 mL/hr over 240 Minutes Intravenous Every 8 hours 01/26/17 1726     01/26/17 1530  piperacillin-tazobactam (ZOSYN) IVPB 3.375 g     3.375 g 100 mL/hr over 30 Minutes Intravenous  Once 01/26/17 1516 01/26/17 1606     Assessment/Plan Abdominal pain Leukocytosis Ileitis with large periappendiceal abscess - perforated appendicitis vs. Inflammatory bowel disease  - recommend drainage by IR today along with GS/cultures  - continue IV abx  - GI consult to r/o IBD   - PRN pain meds/anti-emetics   Despite my recommendation to the patient for the above procedure, IV abx, and GI consult, he chose to leave AMA. I provided him with a prescription for PO abx.   LOS: 3  days    Jill Alexanders , [facility] 01/29/2017, 10:51 AM Pager: [phone] Consults: [phone] Mon-Fri 7:00 am-4:30 pm Sat-Sun 7:00 am-11:30 am

## 2017-01-29 NOTE — Progress Notes (Signed)
Patient  has signed AMA form and left facility under his own care .

## 2017-01-29 NOTE — Progress Notes (Signed)
Patient is requesting to speak with surgical PA stating that he is not going to stay for drain to be placed. Surgical PA Obie Dredge made aware and is coming to unit per patients request. PA in to see patient and patient is refusing to go to IR although the transporter is here.  PA has been given a script for antibiotics and note for school stating the dates of hospitalization.

## 2017-02-10 NOTE — Discharge Summary (Signed)
[facility] Discharge Summary   Patient ID: Derek Lloyd MRN: [id number] DOB/AGE: 44-26-1972 44 y.o.  Admit date: 01/26/2017 Discharge date: 01/29/2017  Discharge Diagnosis Patient Active Problem List   Diagnosis Date Noted  . Right lower quadrant abdominal abscess ([place]) 01/26/2017        Consultants Interventional radiology   Imaging: No results found.  Procedures  Hospital Course:  46 y.o. Male who presented to [facility] with persistent RLQ abdominal pain, last seen in ED 01/24/17 and before that on 01/19/17, associated with vomiting. ED workup significant for leukocytosis and CT scan w/ RLQ infectious process/ileitis and fluid collection along with sigmoid thickening, worse compared to previous CT. Working differential diagnosis included complicated appendicitis with perforation and abscess, terminal ileitis, cecal diverticulitis and the patient was admitted for further treatment. He was started on IV abx and interventional radiology was consulted for percutaneous drainage of abscess. Radiology planned to drain fluid collection on 9/11, however against medical advice the patient left the hospital without drainage of abscess, stating that he felt like all of his medical issues could be taken care of outside the hospital and he needed to get back to school (taking classes at [employer]) because he was "falling behind". He was written an antibiotic prescription prior to signing his [employer] paperwork.    Allergies as of 01/29/2017      Reactions   Asa [aspirin] Rash   Childhood reaction; but tolerates well       Medication List    STOP taking these medications   ciprofloxacin 250 MG tablet Commonly known as:  CIPRO   metroNIDAZOLE 500 MG tablet Commonly known as:  FLAGYL     TAKE these medications   acetaminophen 500 MG tablet Commonly known as:  TYLENOL Take 500 mg by mouth every 6 (six) hours as needed.     ASK your doctor about these medications   ciprofloxacin 250 MG  tablet Commonly known as:  CIPRO Take 1 tablet (250 mg total) by mouth 2 (two) times daily. Ask about: Should I take this medication?   HYDROcodone-acetaminophen 5-325 MG tablet Commonly known as:  NORCO Take 1-2 tablets by mouth every 4 (four) hours as needed for severe pain.   metroNIDAZOLE 500 MG tablet Commonly known as:  FLAGYL Take 1 tablet (500 mg total) by mouth 3 (three) times daily. Ask about: Should I take this medication?   ondansetron 4 MG disintegrating tablet Commonly known as:  ZOFRAN ODT Take 1 tablet (4 mg total) by mouth every 8 (eight) hours as needed for nausea or vomiting.            Discharge Care Instructions        Start     Ordered   01/29/17 0000  ciprofloxacin (CIPRO) 250 MG tablet  2 times daily    Question:  Supervising Provider  Answer:  Johnathan Hausen   01/29/17 1134   01/29/17 0000  metroNIDAZOLE (FLAGYL) 500 MG tablet  3 times daily    Question:  Supervising Provider  Answer:  Johnathan Hausen   01/29/17 1134      Signed: Obie Dredge, [facility] 02/10/2017, 11:02 AM Pager: [phone] Consults: [phone] Mon-Fri 7:00 am-4:30 pm Sat-Sun 7:00 am-11:30 am

## 2017-03-26 ENCOUNTER — Emergency Department (HOSPITAL_COMMUNITY)
Admission: EM | Admit: 2017-03-26 | Discharge: 2017-03-27 | Disposition: A | Discharge status: Home / Self Care | Payer: BLUE CROSS/BLUE SHIELD | Attending: Emergency Medicine | Admitting: Emergency Medicine

## 2017-03-26 ENCOUNTER — Other Ambulatory Visit: Payer: Self-pay

## 2017-03-26 ENCOUNTER — Encounter (HOSPITAL_COMMUNITY): Payer: Self-pay

## 2017-03-26 ENCOUNTER — Emergency Department (HOSPITAL_COMMUNITY): Payer: BLUE CROSS/BLUE SHIELD

## 2017-03-26 DIAGNOSIS — R1031 Right lower quadrant pain: Secondary | ICD-10-CM | POA: Diagnosis not present

## 2017-03-26 DIAGNOSIS — Z79899 Other long term (current) drug therapy: Secondary | ICD-10-CM | POA: Insufficient documentation

## 2017-03-26 DIAGNOSIS — F17210 Nicotine dependence, cigarettes, uncomplicated: Secondary | ICD-10-CM | POA: Insufficient documentation

## 2017-03-26 LAB — COMPREHENSIVE METABOLIC PANEL
ALBUMIN: 4.2 g/dL (ref 3.5–5.0)
ALT: 27 U/L (ref 17–63)
AST: 31 U/L (ref 15–41)
Alkaline Phosphatase: 86 U/L (ref 38–126)
Anion gap: 10 (ref 5–15)
BILIRUBIN TOTAL: 0.6 mg/dL (ref 0.3–1.2)
BUN: 14 mg/dL (ref 6–20)
CO2: 27 mmol/L (ref 22–32)
CREATININE: 0.86 mg/dL (ref 0.61–1.24)
Calcium: 9.4 mg/dL (ref 8.9–10.3)
Chloride: 104 mmol/L (ref 101–111)
GFR calc Af Amer: >60 mL/min (ref >60)
GLUCOSE: 88 mg/dL (ref 65–99)
Potassium: 3.9 mmol/L (ref 3.5–5.1)
Sodium: 141 mmol/L (ref 135–145)
TOTAL PROTEIN: 7.9 g/dL (ref 6.5–8.1)

## 2017-03-26 LAB — CBC
HCT: 44.8 % (ref 39.0–52.0)
Hemoglobin: 15.4 g/dL (ref 13.0–17.0)
MCH: 33.5 pg (ref 26.0–34.0)
MCHC: 34.4 g/dL (ref 30.0–36.0)
MCV: 97.4 fL (ref 78.0–100.0)
PLATELETS: 339 10*3/uL (ref 150–400)
RBC: 4.6 MIL/uL (ref 4.22–5.81)
RDW: 14 % (ref 11.5–15.5)
WBC: 7.6 10*3/uL (ref 4.0–10.5)

## 2017-03-26 LAB — LIPASE, BLOOD: Lipase: 29 U/L (ref 11–51)

## 2017-03-26 MED ORDER — LACTATED RINGERS IV BOLUS (SEPSIS)
1000.0000 mL | Freq: Once | INTRAVENOUS | Status: AC
  Administered 2017-03-27: 1000 mL via INTRAVENOUS

## 2017-03-26 NOTE — ED Triage Notes (Signed)
Pt c/o RLQ abdominal pain that radiates upward. Pt was admitted with appendicitis last month, but left AMA d/t school. He denies N/V. Pt brought [employer] in and was told that he should not eat until he was seen by a  provider, but he stated that he wasn't wasting his money. A&Ox4. Ambulatory.

## 2017-03-27 ENCOUNTER — Encounter (HOSPITAL_COMMUNITY): Payer: Self-pay

## 2017-03-27 ENCOUNTER — Emergency Department (HOSPITAL_COMMUNITY): Payer: BLUE CROSS/BLUE SHIELD

## 2017-03-27 LAB — URINALYSIS, ROUTINE W REFLEX MICROSCOPIC
Bilirubin Urine: NEGATIVE
Glucose, UA: NEGATIVE mg/dL
HGB URINE DIPSTICK: NEGATIVE
Ketones, ur: NEGATIVE mg/dL
Leukocytes, UA: NEGATIVE
Nitrite: NEGATIVE
PROTEIN: NEGATIVE mg/dL
Specific Gravity, Urine: 1.035 — ABNORMAL HIGH (ref 1.005–1.030)
pH: 5 (ref 5.0–8.0)

## 2017-03-27 LAB — I-STAT CG4 LACTIC ACID, ED: LACTIC ACID, VENOUS: 0.43 mmol/L — AB (ref 0.5–1.9)

## 2017-03-27 MED ORDER — ACETAMINOPHEN ER 650 MG PO TBCR: 650.0000 mg | EXTENDED_RELEASE_TABLET | Freq: Three times a day (TID) | ORAL | 0 refills | Status: DC | PRN

## 2017-03-27 MED ORDER — CIPROFLOXACIN HCL 500 MG PO TABS: 500.0000 mg | ORAL_TABLET | Freq: Two times a day (BID) | ORAL | 0 refills | Status: DC

## 2017-03-27 MED ORDER — IOPAMIDOL (ISOVUE-300) INJECTION 61%
INTRAVENOUS | Status: AC
  Administered 2017-03-27: 100 mL via INTRAVENOUS
  Filled 2017-03-27: qty 100

## 2017-03-27 MED ORDER — MORPHINE SULFATE (PF) 4 MG/ML IV SOLN
4.0000 mg | Freq: Once | INTRAVENOUS | Status: AC
  Administered 2017-03-27: 4 mg via INTRAVENOUS
  Filled 2017-03-27: qty 1

## 2017-03-27 MED ORDER — IOPAMIDOL (ISOVUE-300) INJECTION 61%
100.0000 mL | Freq: Once | INTRAVENOUS | Status: AC | PRN
  Administered 2017-03-27: 100 mL via INTRAVENOUS

## 2017-03-27 MED ORDER — METRONIDAZOLE 500 MG PO TABS: 500.0000 mg | ORAL_TABLET | Freq: Two times a day (BID) | ORAL | 0 refills | Status: DC

## 2017-03-27 MED ORDER — IBUPROFEN 600 MG PO TABS: 600.0000 mg | ORAL_TABLET | Freq: Four times a day (QID) | ORAL | 0 refills | Status: DC | PRN

## 2017-03-27 MED ORDER — PIPERACILLIN-TAZOBACTAM 3.375 G IVPB 30 MIN
3.3750 g | Freq: Once | INTRAVENOUS | Status: AC
  Administered 2017-03-27: 3.375 g via INTRAVENOUS
  Filled 2017-03-27: qty 50

## 2017-03-27 NOTE — Discharge Instructions (Signed)
Please contact the Surgeons as requested.

## 2017-03-27 NOTE — ED Notes (Signed)
Patient is aware of urine sample but states that "I cannot pee when I come to the hospital, so good luck getting a urine sample".

## 2017-03-27 NOTE — ED Provider Notes (Signed)
[facility] DEPT Provider Note   CSN: [id number] Arrival date & time: 03/26/17  [place]     History   Chief Complaint Chief Complaint  Patient presents with  . Abdominal Pain    HPI Derek Lloyd is a 46 y.o. male.  HPI  Patient comes in with chief complaint of abdominal pain.  Patient has known history of right lower quadrant abdominal abscess, thought to be from appendix or ileum.  Patient was admitted on 01/24/2017 and he left AMA on 01/29/17, before abscess was drained.  Patient reports that he was sent home with antibiotics and was feeling better until about 4 days ago when his pain returned.  Patient's pain is located on the right side of the abdomen and it is worse with deep inspiration and with palpation.  Patient has nausea without emesis and denies any fevers or chills.  History reviewed. No pertinent past medical history.  Patient Active Problem List   Diagnosis Date Noted  . Right lower quadrant abdominal abscess ([place]) 01/26/2017    History reviewed. No pertinent surgical history.     Home Medications    Prior to Admission medications   Medication Sig Start Date End Date Taking? Authorizing Provider  acetaminophen (TYLENOL 8 HOUR) 650 MG CR tablet Take 1 tablet (650 mg total) every 8 (eight) hours as needed by mouth for pain. 03/27/17   Varney Biles, MD  ciprofloxacin (CIPRO) 500 MG tablet Take 1 tablet (500 mg total) every 12 (twelve) hours by mouth. 03/27/17   Varney Biles, MD  ibuprofen (ADVIL,MOTRIN) 600 MG tablet Take 1 tablet (600 mg total) every 6 (six) hours as needed by mouth. 03/27/17   Varney Biles, MD  metroNIDAZOLE (FLAGYL) 500 MG tablet Take 1 tablet (500 mg total) 2 (two) times daily by mouth. 03/27/17   Varney Biles, MD    Family History History reviewed. No pertinent family history.  Social History Social History   Tobacco Use  . Smoking status: Current Every Day Smoker  Substance Use Topics  . Alcohol  use: Yes  . Drug use: Not on file     Allergies   Asa [aspirin]   Review of Systems Review of Systems  Constitutional: Positive for activity change.  Respiratory: Negative for shortness of breath.   Cardiovascular: Negative for chest pain.  Gastrointestinal: Positive for abdominal pain and nausea. Negative for diarrhea.  Allergic/Immunologic: Negative for immunocompromised state.     Physical Exam Updated Vital Signs BP 106/71 (BP Location: Left Arm)   Pulse 92   Temp 98.1 F (36.7 C) (Oral)   Resp 12   SpO2 95%   Physical Exam  Constitutional: He is oriented to person, place, and time. He appears well-developed.  HENT:  Head: Atraumatic.  Neck: Neck supple.  Cardiovascular: Normal rate.  Pulmonary/Chest: Effort normal.  Abdominal: Soft. Bowel sounds are normal. There is tenderness.  + guarding in the RLQ, no rebound tenderness  Neurological: He is alert and oriented to person, place, and time.  Skin: Skin is warm.  Nursing note and vitals reviewed.    ED Treatments / Results  Labs (all labs ordered are listed, but only abnormal results are displayed) Labs Reviewed  URINALYSIS, ROUTINE W REFLEX MICROSCOPIC - Abnormal; Notable for the following components:      Result Value   Specific Gravity, Urine 1.035 (*)    All other components within normal limits  I-STAT CG4 LACTIC ACID, ED - Abnormal; Notable for the following components:   Lactic  Acid, Venous 0.43 (*)    All other components within normal limits  LIPASE, BLOOD  COMPREHENSIVE METABOLIC PANEL  CBC    EKG  EKG Interpretation None       Radiology Ct Abdomen Pelvis W Contrast  Result Date: 03/27/2017 CLINICAL DATA:  Right lower quadrant pain radiating up horns. Admitted last month appendicitis and left AMA. EXAM: CT ABDOMEN AND PELVIS WITH CONTRAST TECHNIQUE: Multidetector CT imaging of the abdomen and pelvis was performed using the standard protocol following bolus administration of intravenous  contrast. CONTRAST:  100 mL Isovue-300 COMPARISON:  01/26/2017 FINDINGS: Lower chest: Lung bases are clear. Hepatobiliary: No focal liver abnormality is seen. No gallstones, gallbladder wall thickening, or biliary dilatation. Pancreas: Unremarkable. No pancreatic ductal dilatation or surrounding inflammatory changes. Spleen: Normal in size without focal abnormality. Adrenals/Urinary Tract: Adrenal glands are unremarkable. Kidneys are normal, without renal calculi, focal lesion, or hydronephrosis. Bladder is unremarkable. Stomach/Bowel: Stomach is unremarkable. Small bowel are decompressed. Diffusely stool-filled colon without distention. Evaluation of the right lower quadrant is limited without oral contrast material as it is difficult to separate fluid-filled bowel loops from small abscess. However, the abscess seen previously is less prominent today, possibly resolved. The appendix itself is not demonstrated. There is some inflammatory infiltration in the pelvic fat. Vascular/Lymphatic: Aortic atherosclerosis. No enlarged abdominal or pelvic lymph nodes. Reproductive: Prostate is unremarkable. Other: No free air or free fluid in the abdomen. Abdominal wall musculature appears intact. Musculoskeletal: No acute or significant osseous findings. IMPRESSION: 1. The right lower quadrant abscess seen previously is improved since previous study. No definite residual abscess is identified although small residual abscess would be difficult to separate from surrounding fluid-filled bowel loops. Small amount of residual infiltration in the pelvic fat. 2. No bowel obstruction.  Appendix is not identified. 3. Aortic atherosclerosis. Electronically Signed   By: Lucienne Capers M.D.   On: 03/27/2017 00:43    Procedures Procedures (including critical care time)  Medications Ordered in ED Medications  lactated ringers bolus 1,000 mL (1,000 mLs Intravenous New Bag/Given 03/27/17 0013)  morphine 4 MG/ML injection 4 mg (4 mg  Intravenous Given 03/27/17 0036)  piperacillin-tazobactam (ZOSYN) IVPB 3.375 g (0 g Intravenous Stopped 03/27/17 0117)  iopamidol (ISOVUE-300) 61 % injection 100 mL (100 mLs Intravenous Contrast Given 03/27/17 0016)     Initial Impression / Assessment and Plan / ED Course  I have reviewed the triage vital signs and the nursing notes.  Pertinent labs & imaging results that were available during my care of the patient were reviewed by me and considered in my medical decision making (see chart for details).  Clinical Course as of Mar 27 302  Wed Mar 27, 2017  0301 CT scan of the abdomen, just like the labs came back reassuring.  There is no clear evidence of intra-abdominal abscess.  I called and spoke with Dr. Rosendo Gros, general surgery given that patient is having new right-sided abdominal pain in the setting of known large intra-abdominal abscess and possible appendicitis.  Dr. Rosendo Gros and I reviewed the case and the CT findings along with the lab findings, and Dr. Rosendo Gros is recommended outpatient follow-up for possible outpatient appendectomy.  Dr. Rosendo Gros also recommended antibiotics for 7 days.  Results from the ER workup discussed with the patient face to face and all questions answered to the best of my ability.  Strict ER return precautions have been discussed, and patient is agreeing with the plan and is comfortable with the workup done and the  recommendations from the ER.  CT Abdomen Pelvis W Contrast [AN]    Clinical Course User Index [AN] Varney Biles, MD    Patient comes in with chief complaint of right-sided abdominal pain.  Patient was diagnosed with peri-appendix or peri-ileal abscess in September.  Patient left AMA at that time with oral antibiotics and was feeling well until 4 days ago.  Patient now has right lower quadrant tenderness with guarding, however abdomen is still soft and there is no rebound tenderness.  We will order basic labs and a CAT scan, to better understand  the morphology of abscess.  Perforation doubtful.  Vital signs are stable and within normal limits.  Final Clinical Impressions(s) / ED Diagnoses   Final diagnoses:  Right lower quadrant abdominal pain    ED Discharge Orders        Ordered    ciprofloxacin (CIPRO) 500 MG tablet  Every 12 hours     03/27/17 0258    metroNIDAZOLE (FLAGYL) 500 MG tablet  2 times daily     03/27/17 0258    acetaminophen (TYLENOL 8 HOUR) 650 MG CR tablet  Every 8 hours PRN     03/27/17 0259    ibuprofen (ADVIL,MOTRIN) 600 MG tablet  Every 6 hours PRN     03/27/17 0259       Varney Biles, MD 03/27/17 8003

## 2017-03-27 NOTE — ED Notes (Signed)
Patient given urinal and told he needs to give urine specimen, and at least try to urinate.

## 2017-03-27 NOTE — ED Notes (Signed)
Patient transported to CT 

## 2017-05-06 ENCOUNTER — Encounter (HOSPITAL_COMMUNITY): Payer: Self-pay | Admitting: *Deleted

## 2017-05-06 ENCOUNTER — Other Ambulatory Visit: Payer: Self-pay

## 2017-05-06 ENCOUNTER — Emergency Department (HOSPITAL_COMMUNITY)
Admission: EM | Admit: 2017-05-06 | Discharge: 2017-05-06 | Disposition: A | Discharge status: Home / Self Care | Payer: BLUE CROSS/BLUE SHIELD | Attending: Emergency Medicine | Admitting: Emergency Medicine

## 2017-05-06 DIAGNOSIS — K0889 Other specified disorders of teeth and supporting structures: Secondary | ICD-10-CM | POA: Insufficient documentation

## 2017-05-06 DIAGNOSIS — Z79899 Other long term (current) drug therapy: Secondary | ICD-10-CM | POA: Diagnosis not present

## 2017-05-06 DIAGNOSIS — F17210 Nicotine dependence, cigarettes, uncomplicated: Secondary | ICD-10-CM | POA: Insufficient documentation

## 2017-05-06 DIAGNOSIS — R519 Headache, unspecified: Secondary | ICD-10-CM

## 2017-05-06 DIAGNOSIS — R51 Headache: Secondary | ICD-10-CM | POA: Insufficient documentation

## 2017-05-06 LAB — RAPID STREP SCREEN (MED CTR MEBANE ONLY): STREPTOCOCCUS, GROUP A SCREEN (DIRECT): NEGATIVE

## 2017-05-06 LAB — ACETAMINOPHEN LEVEL: Acetaminophen (Tylenol), Serum: 11 ug/mL (ref 10–30)

## 2017-05-06 MED ORDER — PROCHLORPERAZINE EDISYLATE 5 MG/ML IJ SOLN
10.0000 mg | Freq: Once | INTRAMUSCULAR | Status: AC
  Administered 2017-05-06: 10 mg via INTRAVENOUS
  Filled 2017-05-06: qty 2

## 2017-05-06 MED ORDER — FLUTICASONE PROPIONATE 50 MCG/ACT NA SUSP: 1.0000 | Freq: Every day | NASAL | 0 refills | Status: DC

## 2017-05-06 MED ORDER — KETOROLAC TROMETHAMINE 15 MG/ML IJ SOLN
7.5000 mg | Freq: Once | INTRAMUSCULAR | Status: AC
  Administered 2017-05-06: 7.5 mg via INTRAVENOUS
  Filled 2017-05-06: qty 1

## 2017-05-06 MED ORDER — SODIUM CHLORIDE 0.9 % IV BOLUS (SEPSIS)
500.0000 mL | Freq: Once | INTRAVENOUS | Status: AC
  Administered 2017-05-06: 500 mL via INTRAVENOUS

## 2017-05-06 MED ORDER — DIPHENHYDRAMINE HCL 25 MG PO CAPS
25.0000 mg | ORAL_CAPSULE | Freq: Once | ORAL | Status: AC
  Administered 2017-05-06: 25 mg via ORAL
  Filled 2017-05-06: qty 1

## 2017-05-06 MED ORDER — DEXAMETHASONE SODIUM PHOSPHATE 10 MG/ML IJ SOLN
10.0000 mg | Freq: Once | INTRAMUSCULAR | Status: AC
  Administered 2017-05-06: 10 mg via INTRAVENOUS
  Filled 2017-05-06: qty 1

## 2017-05-06 MED ORDER — KETOROLAC TROMETHAMINE 15 MG/ML IJ SOLN
15.0000 mg | Freq: Once | INTRAMUSCULAR | Status: AC
  Administered 2017-05-06: 15 mg via INTRAVENOUS
  Filled 2017-05-06: qty 1

## 2017-05-06 MED ORDER — AMOXICILLIN-POT CLAVULANATE 875-125 MG PO TABS: 1.0000 | ORAL_TABLET | Freq: Two times a day (BID) | ORAL | 0 refills | Status: DC

## 2017-05-06 NOTE — Discharge Instructions (Signed)
Take antibiotics as prescribed.  Take the entire course of antibiotics, even if your symptoms improve. Use Flonase as needed for nasal congestion. Take Tylenol and ibuprofen as needed for pain. Make sure you are staying well-hydrated with water. Follow-up with [facility] if symptoms are not improving. Return to the emergency room if you develop vision change, persistent vomiting, numbness, slurred speech, or any new or worsening symptoms.

## 2017-05-06 NOTE — ED Triage Notes (Addendum)
Pt is here for HA.  Neck supple.  Pt also reports sore throat and right ear pain which he describes as severe. No fever or chills.  Pt reports that he took "5 tablets of 572m tylenol" around 2pm

## 2017-05-06 NOTE — ED Provider Notes (Signed)
[facility] EMERGENCY DEPARTMENT Provider Note   CSN: [id number] Arrival date & time: 05/06/17  1648     History   Chief Complaint Chief Complaint  Patient presents with  . Headache    HPI Derek Lloyd is a 46 y.o. male presenting with headache.  Patient states that last night, he had acute onset right-sided headache.  This begins at his throat and goes up to the top of his head.  It makes his R ear feel like it is throbbing, and his right sided throat and tooth hurt.  He has associated nasal drainage and sinus pressure.  He has taken 5 Tylenol without improvement of pain.  He reports history of similar last year, but does not know what the diagnosis was or what made him better.  He denies vision changes, hearing changes, slurred speech, numbness, tingling.  He denies weakness or dizziness.  He denies fevers, chills, chest pain, cough, shortness of breath, nausea, vomiting, abdominal pain.  He denies fall, trauma or injury. He takes no medicines daily.  HPI  History reviewed. No pertinent past medical history.  Patient Active Problem List   Diagnosis Date Noted  . Right lower quadrant abdominal abscess ([place]) 01/26/2017    History reviewed. No pertinent surgical history.     Home Medications    Prior to Admission medications   Medication Sig Start Date End Date Taking? Authorizing Provider  acetaminophen (TYLENOL 8 HOUR) 650 MG CR tablet Take 1 tablet (650 mg total) every 8 (eight) hours as needed by mouth for pain. 03/27/17   Varney Biles, MD  amoxicillin-clavulanate (AUGMENTIN) 875-125 MG tablet Take 1 tablet by mouth every 12 (twelve) hours. 05/06/17   Woody Kronberg, PA-C  ciprofloxacin (CIPRO) 500 MG tablet Take 1 tablet (500 mg total) every 12 (twelve) hours by mouth. 03/27/17   Varney Biles, MD  fluticasone (FLONASE) 50 MCG/ACT nasal spray Place 1 spray into both nostrils daily. 05/06/17   Dayle Sherpa, PA-C  ibuprofen (ADVIL,MOTRIN)  600 MG tablet Take 1 tablet (600 mg total) every 6 (six) hours as needed by mouth. 03/27/17   Varney Biles, MD  metroNIDAZOLE (FLAGYL) 500 MG tablet Take 1 tablet (500 mg total) 2 (two) times daily by mouth. 03/27/17   Varney Biles, MD    Family History No family history on file.  Social History Social History   Tobacco Use  . Smoking status: Current Every Day Smoker    Types: Cigarettes  . Smokeless tobacco: Never Used  Substance Use Topics  . Alcohol use: Yes  . Drug use: No     Allergies   Asa [aspirin]   Review of Systems Review of Systems  Constitutional: Negative for chills and fever.  HENT: Positive for congestion, dental problem, ear pain, postnasal drip, sinus pressure, sinus pain and sore throat.   Neurological: Positive for headaches.     Physical Exam Updated Vital Signs BP 111/66   Pulse 86   Temp (!) 97.5 F (36.4 C) (Oral)   Resp 16   Wt 63.5 kg (140 lb)   SpO2 95%   BMI 20.09 kg/m   Physical Exam  Constitutional: He is oriented to person, place, and time. He appears well-developed and well-nourished. No distress.  HENT:  Head: Normocephalic and atraumatic.  Right Ear: Tympanic membrane, external ear and ear canal normal.  Left Ear: Tympanic membrane, external ear and ear canal normal.  Nose: Mucosal edema present. Right sinus exhibits maxillary sinus tenderness. Left sinus exhibits no maxillary  sinus tenderness.  Mouth/Throat: Uvula is midline, oropharynx is clear and moist and mucous membranes are normal. No oral lesions. No trismus in the jaw. Abnormal dentition. No dental abscesses or uvula swelling. No tonsillar exudate.    Nasal mucosal edema, worse on the right side.  Tenderness palpation of right maxillary sinus. OP clear without tonsillar swelling or exudate.  Uvula midline with equal palate rise.  No pain under the tongue.  Right lower tooth with erythema and swelling of the surrounding gum.  No obvious abscess.  Eyes: Conjunctivae  and EOM are normal. Pupils are equal, round, and reactive to light.  Neck: Normal range of motion.  Cardiovascular: Normal rate, regular rhythm and intact distal pulses.  Pulmonary/Chest: Effort normal and breath sounds normal. No respiratory distress. He has no decreased breath sounds. He has no wheezes. He has no rhonchi. He has no rales.  Pt speaking in full sentences. Clear lung sounds in all fields  Abdominal: Soft. He exhibits no distension. There is no tenderness.  Musculoskeletal: Normal range of motion.  Lymphadenopathy:    He has no cervical adenopathy.  Neurological: He is alert and oriented to person, place, and time. He has normal strength. No cranial nerve deficit. GCS eye subscore is 4. GCS verbal subscore is 5. GCS motor subscore is 6.  Skin: Skin is warm.  Psychiatric: He has a normal mood and affect.  Nursing note and vitals reviewed.    ED Treatments / Results  Labs (all labs ordered are listed, but only abnormal results are displayed) Labs Reviewed  RAPID STREP SCREEN (NOT AT [facility])  CULTURE, GROUP A STREP [facility])  ACETAMINOPHEN LEVEL    EKG  EKG Interpretation None       Radiology No results found.  Procedures Procedures (including critical care time)  Medications Ordered in ED Medications  sodium chloride 0.9 % bolus 500 mL (0 mLs Intravenous Stopped 05/06/17 2115)  ketorolac (TORADOL) 15 MG/ML injection 15 mg (15 mg Intravenous Given 05/06/17 1946)  prochlorperazine (COMPAZINE) injection 10 mg (10 mg Intravenous Given 05/06/17 1944)  diphenhydrAMINE (BENADRYL) capsule 25 mg (25 mg Oral Given 05/06/17 1944)  dexamethasone (DECADRON) injection 10 mg (10 mg Intravenous Given 05/06/17 1945)  ketorolac (TORADOL) 15 MG/ML injection 7.5 mg (7.5 mg Intravenous Given 05/06/17 2112)     Initial Impression / Assessment and Plan / ED Course  I have reviewed the triage vital signs and the nursing notes.  Pertinent labs & imaging results that were available  during my care of the patient were reviewed by me and considered in my medical decision making (see chart for details).     Patient presenting for evaluation of headache.  Headache is on the right side.  Physical exam shows right-sided nasal congestion and right tooth caries with surrounding gum erythema.  Neuro exam reassuring.  Doubt CVA, bleed, or intracranial infection has a source of headache.  No sign of ludvigs or peritonsillar abscess. Likely sinus or tooth infection as source.  Will give headache cocktail and reassess.  Patient reports symptoms are improved with headache cocktail.  Will discharge with antibiotics.  At this time, patient appears safe for discharge.  Return precautions given.  Patient states he understands and agrees to plan.   Final Clinical Impressions(s) / ED Diagnoses   Final diagnoses:  Acute nonintractable headache, unspecified headache type  Tooth pain    ED Discharge Orders        Ordered    fluticasone (FLONASE) 50 MCG/ACT nasal spray  Daily     05/06/17 2115    amoxicillin-clavulanate (AUGMENTIN) 875-125 MG tablet  Every 12 hours     05/06/17 2115       Franchot Heidelberg, PA-C 05/07/17 0135    Malvin Johns, MD 05/07/17 1358

## 2017-05-06 NOTE — ED Notes (Signed)
See provider assessment

## 2017-05-09 LAB — CULTURE, GROUP A STREP (THRC)

## 2017-08-13 ENCOUNTER — Other Ambulatory Visit: Payer: Self-pay

## 2017-08-13 ENCOUNTER — Inpatient Hospital Stay (HOSPITAL_COMMUNITY)
Admission: EM | Admit: 2017-08-13 | Discharge: 2017-08-15 | DRG: 386 | Discharge status: Against Medical Advice | Payer: BLUE CROSS/BLUE SHIELD | Attending: Family Medicine | Admitting: Family Medicine

## 2017-08-13 ENCOUNTER — Encounter (HOSPITAL_COMMUNITY): Payer: Self-pay

## 2017-08-13 ENCOUNTER — Emergency Department (HOSPITAL_COMMUNITY): Payer: BLUE CROSS/BLUE SHIELD

## 2017-08-13 DIAGNOSIS — R1031 Right lower quadrant pain: Secondary | ICD-10-CM | POA: Diagnosis present

## 2017-08-13 DIAGNOSIS — K50012 Crohn's disease of small intestine with intestinal obstruction: Secondary | ICD-10-CM | POA: Diagnosis present

## 2017-08-13 DIAGNOSIS — K56609 Unspecified intestinal obstruction, unspecified as to partial versus complete obstruction: Secondary | ICD-10-CM | POA: Diagnosis not present

## 2017-08-13 DIAGNOSIS — R188 Other ascites: Secondary | ICD-10-CM | POA: Diagnosis present

## 2017-08-13 DIAGNOSIS — F17210 Nicotine dependence, cigarettes, uncomplicated: Secondary | ICD-10-CM | POA: Diagnosis present

## 2017-08-13 DIAGNOSIS — Z886 Allergy status to analgesic agent status: Secondary | ICD-10-CM | POA: Diagnosis not present

## 2017-08-13 DIAGNOSIS — K529 Noninfective gastroenteritis and colitis, unspecified: Secondary | ICD-10-CM

## 2017-08-13 HISTORY — DX: Unspecified intestinal obstruction, unspecified as to partial versus complete obstruction: K56.609

## 2017-08-13 HISTORY — DX: Panic disorder (episodic paroxysmal anxiety): F41.0

## 2017-08-13 HISTORY — DX: Headache, unspecified: R51.9

## 2017-08-13 HISTORY — DX: Headache: R51

## 2017-08-13 HISTORY — DX: Cardiac murmur, unspecified: R01.1

## 2017-08-13 LAB — CBC
HEMATOCRIT: 50.9 % (ref 39.0–52.0)
Hemoglobin: 17.2 g/dL — ABNORMAL HIGH (ref 13.0–17.0)
MCH: 32.6 pg (ref 26.0–34.0)
MCHC: 33.8 g/dL (ref 30.0–36.0)
MCV: 96.4 fL (ref 78.0–100.0)
Platelets: 365 10*3/uL (ref 150–400)
RBC: 5.28 MIL/uL (ref 4.22–5.81)
RDW: 13.3 % (ref 11.5–15.5)
WBC: 8.7 10*3/uL (ref 4.0–10.5)

## 2017-08-13 LAB — COMPREHENSIVE METABOLIC PANEL
ALT: 45 U/L (ref 17–63)
ANION GAP: 10 (ref 5–15)
AST: 35 U/L (ref 15–41)
Albumin: 4.2 g/dL (ref 3.5–5.0)
Alkaline Phosphatase: 81 U/L (ref 38–126)
BUN: 11 mg/dL (ref 6–20)
CHLORIDE: 104 mmol/L (ref 101–111)
CO2: 23 mmol/L (ref 22–32)
Calcium: 9.4 mg/dL (ref 8.9–10.3)
Creatinine, Ser: 0.87 mg/dL (ref 0.61–1.24)
GFR calc non Af Amer: >60 mL/min (ref >60)
Glucose, Bld: 114 mg/dL — ABNORMAL HIGH (ref 65–99)
POTASSIUM: 4.2 mmol/L (ref 3.5–5.1)
Sodium: 137 mmol/L (ref 135–145)
Total Bilirubin: 1.3 mg/dL — ABNORMAL HIGH (ref 0.3–1.2)
Total Protein: 7.2 g/dL (ref 6.5–8.1)

## 2017-08-13 LAB — BRAIN NATRIURETIC PEPTIDE: B Natriuretic Peptide: 14.2 pg/mL (ref 0.0–100.0)

## 2017-08-13 LAB — LACTIC ACID, PLASMA: LACTIC ACID, VENOUS: 0.7 mmol/L (ref 0.5–1.9)

## 2017-08-13 LAB — TROPONIN I: Troponin I: <0.03 ng/mL (ref <0.03)

## 2017-08-13 LAB — LIPASE, BLOOD: LIPASE: 28 U/L (ref 11–51)

## 2017-08-13 MED ORDER — IOPAMIDOL (ISOVUE-300) INJECTION 61%
INTRAVENOUS | Status: AC
  Administered 2017-08-13: 100 mL
  Filled 2017-08-13: qty 100

## 2017-08-13 MED ORDER — ONDANSETRON HCL 4 MG PO TABS: 4.0000 mg | ORAL_TABLET | Freq: Four times a day (QID) | ORAL | Status: DC | PRN

## 2017-08-13 MED ORDER — SODIUM CHLORIDE 0.9 % IV BOLUS
1000.0000 mL | Freq: Once | INTRAVENOUS | Status: AC
  Administered 2017-08-13: 1000 mL via INTRAVENOUS

## 2017-08-13 MED ORDER — SODIUM CHLORIDE 0.9 % IV SOLN
INTRAVENOUS | Status: DC
  Administered 2017-08-13 – 2017-08-14 (×2): via INTRAVENOUS

## 2017-08-13 MED ORDER — ONDANSETRON 4 MG PO TBDP
4.0000 mg | ORAL_TABLET | Freq: Once | ORAL | Status: AC
  Administered 2017-08-13: 4 mg via ORAL
  Filled 2017-08-13: qty 1

## 2017-08-13 MED ORDER — ONDANSETRON HCL 4 MG/2ML IJ SOLN
4.0000 mg | Freq: Four times a day (QID) | INTRAMUSCULAR | Status: DC | PRN
  Administered 2017-08-14: 4 mg via INTRAVENOUS
  Filled 2017-08-13: qty 2

## 2017-08-13 MED ORDER — MORPHINE SULFATE (PF) 4 MG/ML IV SOLN
4.0000 mg | Freq: Once | INTRAVENOUS | Status: AC
  Administered 2017-08-13: 4 mg via INTRAVENOUS
  Filled 2017-08-13: qty 1

## 2017-08-13 MED ORDER — METRONIDAZOLE IN NACL 5-0.79 MG/ML-% IV SOLN
500.0000 mg | Freq: Three times a day (TID) | INTRAVENOUS | Status: DC
  Administered 2017-08-13 – 2017-08-15 (×6): 500 mg via INTRAVENOUS
  Filled 2017-08-13 (×7): qty 100

## 2017-08-13 MED ORDER — FENTANYL CITRATE (PF) 100 MCG/2ML IJ SOLN
25.0000 ug | INTRAMUSCULAR | Status: DC | PRN
  Administered 2017-08-13 – 2017-08-15 (×9): 50 ug via INTRAVENOUS
  Filled 2017-08-13 (×9): qty 2

## 2017-08-13 MED ORDER — SODIUM CHLORIDE 0.9 % IV SOLN
2.0000 g | INTRAVENOUS | Status: DC
  Administered 2017-08-13 – 2017-08-14 (×2): 2 g via INTRAVENOUS
  Filled 2017-08-13 (×3): qty 20

## 2017-08-13 MED ORDER — ACETAMINOPHEN 650 MG RE SUPP: 650.0000 mg | Freq: Four times a day (QID) | RECTAL | Status: DC | PRN

## 2017-08-13 MED ORDER — ACETAMINOPHEN 325 MG PO TABS: 650.0000 mg | ORAL_TABLET | Freq: Four times a day (QID) | ORAL | Status: DC | PRN

## 2017-08-13 NOTE — ED Notes (Signed)
Patient transported to CT 

## 2017-08-13 NOTE — Consult Note (Signed)
Surgical Consultation Requesting provider: Dr. Myna Hidalgo  CC: abdominal pain  HPI: Very pleasant 47yo man w history of multiple ER visits/ admission/ AMA discharges - see below - for RLQ inflammatory process who returns to the ER this afternoon with diffuse abdominal pain for the last 3 days associated with bloating, nausea, nonbilious, nonbloody emesis. Abdominal pain radiates into scrotum and he feels that his testicles are drawing up.  He states that unlike his previous pain episodes, the pain seems to be starting on the left and migrating to the right with the most pain in the periumbilical region. Also associated with chest pain, palpitations, shortness of breath and bilateral low back pain. Denies fever. Continues to have bowel movements. Of note, he does report a 12 pound unintentional weight loss over the last year or so.  He also describes diarrhea, approximately 4 bowel movements per day, for the last year.  He has had 4 episodes of fecal incontinence due to fecal urgency over the course of his life.  He reports a decreased appetite.   He does state that his symptoms seem to be exacerbated by stress, and admits that he is very stressed right now due to school.  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  He has been in school since 2013.  Prior to this he was in the [employer] for about 8 years.  His last ER visits/admission, he had to leave due to concerns about his housing (was living in a hotel) as well as concerns about falling behind in school and wanting to keep his GPA high.  Tonight he states that his home situation has leveled out, and that he has a little bit of a safety net as far as his classes etc. and so he is willing to be admitted and to go through whatever workup and treatment is recommended.  He does not have any known family history of inflammatory bowel disease but admits that he does not really know his family's medical history  well.  His father did have prostate cancer.  He has never had any endoscopic evaluation.  He is a active smoker.   In the ER his vital signs, CBC, CMP, BNP and troponin are normal.  CT with partial SBO and new small volume ascites, thick walled ileum again c/w ileitis, appendix not identified- full report below. Surgery asked to consult.  June 2016: Er visit for abdominal pain, bloating, nausea, emesis. Requested DC home when symptoms were treated Jan 19 2017: Er visit for the same. CT showed ileitis and dilated appendix thought to be sympathetic/ secondary to the the small bowel inflammation. Overnight obs recommended but pt declined admission. dc'd w abx Jan 24 2017: ER return with persistent sx/ repeat Ct shows worsening inflammatory process/ abscess in RLQ, " Crohn's disease is suspected, however infectious ileitis vs. Perforated appendix, vs Meckel's diverticulitis. Recommend CT with full oral contrast prep and IV contrast follow up". Patient refused admission that evening but did return 2 days later for admission, IV abx were started and GI consultation to assess for IBD as well as IR consultation for perc drain were planned. He again left AMA before any of that was done.  Mar 27 2017: return to ER with recurrent pain. Ct showed improved abscess but some residual inflammation.Appendix was not identified. Not clear what transpired but he was not admitted. May 06 2017: Er for headache  Allergies  Allergen Reactions  . Asa [Aspirin]  Rash    Childhood reaction; but tolerates well     History reviewed. No pertinent past medical history.  History reviewed. No pertinent surgical history.  History reviewed. No pertinent family history.  Social History   Socioeconomic History  . Marital status: Married    Spouse name: Not on file  . Number of children: Not on file  . Years of education: Not on file  . Highest education level: Not on file  Occupational History  . Not on file  Social Needs   . Financial resource strain: Not on file  . Food insecurity:    Worry: Not on file    Inability: Not on file  . Transportation needs:    Medical: Not on file    Non-medical: Not on file  Tobacco Use  . Smoking status: Current Every Day Smoker    Packs/day: 1.00    Types: Cigarettes  . Smokeless tobacco: Never Used  Substance and Sexual Activity  . Alcohol use: Yes    Comment: occ  . Drug use: No  . Sexual activity: Not on file  Lifestyle  . Physical activity:    Days per week: Not on file    Minutes per session: Not on file  . Stress: Not on file  Relationships  . Social connections:    Talks on phone: Not on file    Gets together: Not on file    Attends religious service: Not on file    Active member of club or organization: Not on file    Attends meetings of clubs or organizations: Not on file    Relationship status: Not on file  Other Topics Concern  . Not on file  Social History Narrative  . Not on file    No current facility-administered medications on file prior to encounter.    Current Outpatient Medications on File Prior to Encounter  Medication Sig Dispense Refill  . acetaminophen (TYLENOL 8 HOUR) 650 MG CR tablet Take 1 tablet (650 mg total) every 8 (eight) hours as needed by mouth for pain. 30 tablet 0  . amoxicillin-clavulanate (AUGMENTIN) 875-125 MG tablet Take 1 tablet by mouth every 12 (twelve) hours. 14 tablet 0  . ciprofloxacin (CIPRO) 500 MG tablet Take 1 tablet (500 mg total) every 12 (twelve) hours by mouth. 10 tablet 0  . fluticasone (FLONASE) 50 MCG/ACT nasal spray Place 1 spray into both nostrils daily. 16 g 0  . ibuprofen (ADVIL,MOTRIN) 600 MG tablet Take 1 tablet (600 mg total) every 6 (six) hours as needed by mouth. 30 tablet 0  . metroNIDAZOLE (FLAGYL) 500 MG tablet Take 1 tablet (500 mg total) 2 (two) times daily by mouth. 14 tablet 0    Review of Systems: a complete, 10pt review of systems was completed with pertinent positives and  negatives as documented in the HPI  Physical Exam: Vitals:   08/13/17 1416  BP: 120/81  Pulse: 93  Resp: 16  Temp: 98.1 F (36.7 C)  SpO2: 100%   Gen: A&Ox3, no distress thin Caucasian male Head: normocephalic, atraumatic Eyes: extraocular motions intact, anicteric.  Neck: supple without mass or thyromegaly Chest: unlabored respirations, symmetrical air entry, clear bilaterally   Cardiovascular: RRR with palpable distal pulses, no pedal edema.  Abdomen: soft, mildly distended, tender with voluntary guarding in the left periumbilical region as well as the right lower quadrant.  No rebound or involuntary guarding. No mass or organomegaly.  Extremities: warm, no deformities.  Neuro: grossly intact Psych: appropriate mood and  affect, normal insight  Skin: warm and dry   CBC Latest Ref Rng & Units 08/13/2017 03/26/2017 01/29/2017  WBC 4.0 - 10.5 K/uL 8.7 7.6 12.1(H)  Hemoglobin 13.0 - 17.0 g/dL 17.2(H) 15.4 13.3  Hematocrit 39.0 - 52.0 % 50.9 44.8 40.0  Platelets 150 - 400 K/uL 365 339 654(H)    CMP Latest Ref Rng & Units 08/13/2017 03/26/2017 01/29/2017  Glucose 65 - 99 mg/dL 114(H) 88 88  BUN 6 - 20 mg/dL 11 14 <5(L)  Creatinine 0.61 - 1.24 mg/dL 0.87 0.86 0.96  Sodium 135 - 145 mmol/L 137 141 138  Potassium 3.5 - 5.1 mmol/L 4.2 3.9 4.0  Chloride 101 - 111 mmol/L 104 104 103  CO2 22 - 32 mmol/L 23 27 26   Calcium 8.9 - 10.3 mg/dL 9.4 9.4 8.6(L)  Total Protein 6.5 - 8.1 g/dL 7.2 7.9 6.6  Total Bilirubin 0.3 - 1.2 mg/dL 1.3(H) 0.6 0.5  Alkaline Phos 38 - 126 U/L 81 86 521(H)  AST 15 - 41 U/L 35 31 92(H)  ALT 17 - 63 U/L 45 27 41    Lab Results  Component Value Date   INR 1.08 01/29/2017    Imaging: Ct Abdomen Pelvis W Contrast  Result Date: 08/13/2017 CLINICAL DATA:  Generalized abdominal pain with nausea, vomiting, and flank pain for 2 days. EXAM: CT ABDOMEN AND PELVIS WITH CONTRAST TECHNIQUE: Multidetector CT imaging of the abdomen and pelvis was performed using the  standard protocol following bolus administration of intravenous contrast. CONTRAST:  137m ISOVUE-300 IOPAMIDOL (ISOVUE-300) INJECTION 61% COMPARISON:  03/27/2017 FINDINGS: Lower chest: Clear lung bases. Hepatobiliary: Subcentimeter hypodensity in the lateral segment of the left hepatic lobe, too small to fully characterize. Unremarkable gallbladder. No biliary dilatation. Pancreas: Unremarkable. Spleen: Unremarkable. Adrenals/Urinary Tract: Unremarkable adrenal glands. No evidence of renal mass, calculi, or hydronephrosis. Unremarkable bladder. Stomach/Bowel: The stomach is within normal limits. There are multiple fluid-filled loops of dilated mid and distal small bowel which measure up to 3.2 cm in diameter. The terminal ileum is collapsed and may be diffusely thick walled. The colon is nondilated, as are proximal small bowel loops. The appendix is not clearly identified, although there is some soft tissue thickening in the right lower quadrant which extends inferiorly in the pelvis towards the rectum as previously seen. No residual right lower quadrant abscess is identified. Vascular/Lymphatic: Abdominal aortic atherosclerosis without aneurysm. No enlarged lymph nodes. Reproductive: Unremarkable prostate. Other: New small volume ascites. Enlargement of mesenteric/omental vessels most notable along the transverse colon. No loculated fluid collection. Musculoskeletal: No acute osseous abnormality or suspicious osseous lesion. IMPRESSION: 1. Relatively diffuse small bowel dilatation consistent with obstruction. Transition point in the right lower quadrant where the terminal ileum is decompressed and thick-walled which may reflect ileitis. Appendix not identified. 2. New small volume ascites.  No abscess identified. Electronically Signed   By: ALogan BoresM.D.   On: 08/13/2017 19:03   A/P: 47year old gentleman with abdominal pain, partial small bowel obstruction likely secondary to terminal ileitis given the  findings on CT scan.  While it is possible that he had ruptured appendicitis several months ago, his imaging has been unusual from the start.  He does have some other symptoms that are concerning for inflammatory bowel disease.  At this point there is no indication for urgent surgical intervention.  I do recommend admission for fluid resuscitation, empiric antibiotics, and GI consultation to evaluate for and possibly treat inflammatory bowel disease.  I agree that he does not necessarily  need an NG tube at this moment but if he does become more nauseated or progress to vomiting would recommend NG insertion.  Surgery will continue to follow, thank you for involving [place] in this nice man's care.   Romana Juniper, MD [facility], [place] Pager [phone]

## 2017-08-13 NOTE — ED Provider Notes (Signed)
Patient placed in Quick Look pathway, seen and evaluated   Chief Complaint: Abdominal pain  HPI:   Patient with diffuse abdominal pain.  Worsening over the past several days.  Hx of the same.  Was seen for the same back in September and thought to have appendiceal abscess and ileitis. Left AMA without surgery.  Recurrence of pain over the past few days.  No fever.  Associated vomiting.  ROS: abdominal pain  Physical Exam:   Gen: No distress  Neuro: Awake and Alert  Skin: Warm    Focused Exam: diffuse abdominal tenderness  Repeat CT and labs.    Initiation of care has begun. The patient has been counseled on the process, plan, and necessity for staying for the completion/evaluation, and the remainder of the medical screening examination    Montine Circle, Hershal Coria 08/13/17 1426    Dorie Rank, MD 08/15/17 1140

## 2017-08-13 NOTE — H&P (Signed)
History and Physical    Derek Lloyd PPI:[id number] DOB: 06-15-1970 DOA: 08/13/2017  PCP: Patient, [name]   Patient coming from: Home   Chief Complaint: Abd pain, N/V   HPI: Derek Lloyd is a 47 y.o. male who denies any significant past medical history, now presenting to the emergency department for evaluation of abdominal pain, nausea, and nonbloody vomiting.  Patient reports that he developed pain in the lower abdomen approximately 3 days ago with nausea and has had progression in his symptoms since that time.  He describes waxing and waning pain, mainly in the right lower quadrant with radiation across to the left lower quadrant, cramping and sharp in nature, and without alleviating or exacerbating factors.  He experienced similar symptoms approximately 6 months ago, was admitted to the hospital at that time with apparent abscess in the right lower quadrant, possibly related to the appendix or terminal ileum, and was to have IR drainage but he left AMA prior to this.  Symptoms eventually resolved at home before returning 3 days ago.  Denies fevers or chills.  Denies diarrhea.  Had a bowel movement this morning.  ED Course: Upon arrival to the ED, patient is found to be afebrile, saturating well on room air, and with vitals otherwise normal.  EKG features normal sinus rhythm.  Chemistry panel with total bilirubin 1.3 and CBC with hemoglobin 17.2.  Troponin is undetectable.  CT of the abdomen and pelvis reveals diffuse small bowel dilation consistent with obstruction, likely transition point in the right lower quadrant where the terminal ileum appears thick walled and decompressed, possibly reflecting ileitis.  Surgery was consulted by the ED physician and recommended medical admission.  Patient was treated with morphine, Zofran, and 1 L of normal saline in the ED.  He remains hemodynamically stable, in no apparent respiratory distress, and will be admitted to the medical-surgical unit for  ongoing evaluation and management of small bowel obstruction, possibly secondary to inflammatory or infectious disease in the terminal ileum.   Review of Systems:  All other systems reviewed and apart from HPI, are negative.  History reviewed. No pertinent past medical history.  History reviewed. No pertinent surgical history.   reports that he has been smoking cigarettes.  He has been smoking about 1.00 pack per day. He has never used smokeless tobacco. He reports that he drinks alcohol. He reports that he does not use drugs.  Allergies  Allergen Reactions  . Asa [Aspirin] Rash    Childhood reaction; but tolerates well     History reviewed. No pertinent family history.   Prior to Admission medications   Medication Sig Start Date End Date Taking? Authorizing Provider  acetaminophen (TYLENOL 8 HOUR) 650 MG CR tablet Take 1 tablet (650 mg total) every 8 (eight) hours as needed by mouth for pain. 03/27/17   Varney Biles, MD  fluticasone (FLONASE) 50 MCG/ACT nasal spray Place 1 spray into both nostrils daily. 05/06/17   Caccavale, Sophia, PA-C  ibuprofen (ADVIL,MOTRIN) 600 MG tablet Take 1 tablet (600 mg total) every 6 (six) hours as needed by mouth. 03/27/17   Varney Biles, MD    Physical Exam: Vitals:   08/13/17 1416 08/13/17 1417  BP: 120/81   Pulse: 93   Resp: 16   Temp: 98.1 F (36.7 C)   TempSrc: Oral   SpO2: 100%   Weight:  63.5 kg (140 lb)  Height:  5' 10"  (1.778 m)      Constitutional: NAD, calm  Eyes: PERTLA, lids  and conjunctivae normal ENMT: Mucous membranes are moist. Posterior pharynx clear of any exudate or lesions.   Neck: normal, supple, no masses, no thyromegaly Respiratory: clear to auscultation bilaterally, no wheezing, no crackles. Normal respiratory effort.   Cardiovascular: S1 & S2 heard, regular rate and rhythm. No extremity edema. No significant JVD. Abdomen: No distension, soft, tender across lower quadrants without rebound pain or guarding.  Bowel sounds are active.  Musculoskeletal: no clubbing / cyanosis. No joint deformity upper and lower extremities.    Skin: no significant rashes, lesions, ulcers. Warm, dry, well-perfused. Neurologic: CN 2-12 grossly intact. Sensation intact. Strength 5/5 in all 4 limbs.  Psychiatric: Alert and oriented x 3. Calm, cooperative.     Labs on Admission: I have personally reviewed following labs and imaging studies  CBC: Recent Labs  Lab 08/13/17 1429  WBC 8.7  HGB 17.2*  HCT 50.9  MCV 96.4  PLT 751   Basic Metabolic Panel: Recent Labs  Lab 08/13/17 1429  NA 137  K 4.2  CL 104  CO2 23  GLUCOSE 114*  BUN 11  CREATININE 0.87  CALCIUM 9.4   GFR: Estimated Creatinine Clearance: 97.3 mL/min (by C-G formula based on SCr of 0.87 mg/dL). Liver Function Tests: Recent Labs  Lab 08/13/17 1429  AST 35  ALT 45  ALKPHOS 81  BILITOT 1.3*  PROT 7.2  ALBUMIN 4.2   Recent Labs  Lab 08/13/17 1429  LIPASE 28   No results for input(s): AMMONIA in the last 168 hours. Coagulation Profile: No results for input(s): INR, PROTIME in the last 168 hours. Cardiac Enzymes: Recent Labs  Lab 08/13/17 1754  TROPONINI <0.03   BNP (last 3 results) No results for input(s): PROBNP in the last 8760 hours. HbA1C: No results for input(s): HGBA1C in the last 72 hours. CBG: No results for input(s): GLUCAP in the last 168 hours. Lipid Profile: No results for input(s): CHOL, HDL, LDLCALC, TRIG, CHOLHDL, LDLDIRECT in the last 72 hours. Thyroid Function Tests: No results for input(s): TSH, T4TOTAL, FREET4, T3FREE, THYROIDAB in the last 72 hours. Anemia Panel: No results for input(s): VITAMINB12, FOLATE, FERRITIN, TIBC, IRON, RETICCTPCT in the last 72 hours. Urine analysis:    Component Value Date/Time   COLORURINE YELLOW 03/27/2017 0145   APPEARANCEUR CLEAR 03/27/2017 0145   LABSPEC 1.035 (H) 03/27/2017 0145   PHURINE 5.0 03/27/2017 0145   GLUCOSEU NEGATIVE 03/27/2017 0145   HGBUR  NEGATIVE 03/27/2017 0145   BILIRUBINUR NEGATIVE 03/27/2017 0145   KETONESUR NEGATIVE 03/27/2017 0145   PROTEINUR NEGATIVE 03/27/2017 0145   UROBILINOGEN 1.0 11/13/2014 1800   NITRITE NEGATIVE 03/27/2017 0145   LEUKOCYTESUR NEGATIVE 03/27/2017 0145   Sepsis Labs: @LABRCNTIP (procalcitonin:4,lacticidven:4) )No results found for this or any previous visit (from the past 240 hour(s)).   Radiological Exams on Admission: Ct Abdomen Pelvis W Contrast  Result Date: 08/13/2017 CLINICAL DATA:  Generalized abdominal pain with nausea, vomiting, and flank pain for 2 days. EXAM: CT ABDOMEN AND PELVIS WITH CONTRAST TECHNIQUE: Multidetector CT imaging of the abdomen and pelvis was performed using the standard protocol following bolus administration of intravenous contrast. CONTRAST:  140m ISOVUE-300 IOPAMIDOL (ISOVUE-300) INJECTION 61% COMPARISON:  03/27/2017 FINDINGS: Lower chest: Clear lung bases. Hepatobiliary: Subcentimeter hypodensity in the lateral segment of the left hepatic lobe, too small to fully characterize. Unremarkable gallbladder. No biliary dilatation. Pancreas: Unremarkable. Spleen: Unremarkable. Adrenals/Urinary Tract: Unremarkable adrenal glands. No evidence of renal mass, calculi, or hydronephrosis. Unremarkable bladder. Stomach/Bowel: The stomach is within normal limits. There are multiple  fluid-filled loops of dilated mid and distal small bowel which measure up to 3.2 cm in diameter. The terminal ileum is collapsed and may be diffusely thick walled. The colon is nondilated, as are proximal small bowel loops. The appendix is not clearly identified, although there is some soft tissue thickening in the right lower quadrant which extends inferiorly in the pelvis towards the rectum as previously seen. No residual right lower quadrant abscess is identified. Vascular/Lymphatic: Abdominal aortic atherosclerosis without aneurysm. No enlarged lymph nodes. Reproductive: Unremarkable prostate. Other: New  small volume ascites. Enlargement of mesenteric/omental vessels most notable along the transverse colon. No loculated fluid collection. Musculoskeletal: No acute osseous abnormality or suspicious osseous lesion. IMPRESSION: 1. Relatively diffuse small bowel dilatation consistent with obstruction. Transition point in the right lower quadrant where the terminal ileum is decompressed and thick-walled which may reflect ileitis. Appendix not identified. 2. New small volume ascites.  No abscess identified. Electronically Signed   By: Logan Bores M.D.   On: 08/13/2017 19:00    EKG: Independently reviewed. Normal sinus rhythm.   Assessment/Plan   1. SBO  - Presents with 3 days of progressive lower abdominal pain, nausea, and vomiting  - Afebrile, basic labs unremarkable, CT abd/pelvis suggestive of SBO with transition point in RLQ where terminal ileum appears thickened and where he had an abscess 6 months ago that was to be drained by IR prior to pt leaving AMA  - Surgery is consulting and much appreciated, will follow-up on recommendations  - Will mange conservatively for now with bowel rest, IVF hydration, prn analgesia, and prn antiemetics - Nausea well-controlled with Zofran in ED; will hold-off on NGT for now     DVT prophylaxis: SCD's  Code Status: Full  Family Communication: Discussed with patient Consults called: Surgery Admission status: Inpatient    Vianne Bulls, MD [facility] Pager [phone]  If 7PM-7AM, please contact night-coverage [email or web address] Password [email or web address]  08/13/2017, 8:01 PM

## 2017-08-13 NOTE — Progress Notes (Signed)
Received patient from ED, AOx4, ambulatory, VS stable, O2Sat at 100% on RA, c/o abdominal pain at 10/10 after taking a shower, oriented to room, bed control and call light.  Administered PRN pain medication Fentanyl per order, now resting on bed watching TV.  Will monitor.

## 2017-08-13 NOTE — ED Provider Notes (Signed)
[place] [facility] [address]  SURGICAL Provider Note   CSN: [id number] Arrival date & time: 08/13/17  1350     History   Chief Complaint Chief Complaint  Patient presents with  . Abdominal Pain    HPI Derek Lloyd is a 47 y.o. male with history of right lower quadrant abdominal abscess in September 2018 who presents with a 3-day history of generalized abdominal pain, nausea, vomiting.  Patient has had associated chest pain, palpitations, and shortness of breath.  He has also had low back pain bilaterally.  He reports his abdominal symptoms are very similar to when he had an abscess in September, for which he left [place] because he had to get back to school.  At that time, he was given IV antibiotics.  He reports that he has had palpitations, chest pain, and shortness of breath associated with this time, and is concerned he has congestive heart failure.  Patient's symptoms seem to be worse when he is in pain.  Patient's abdominal pain has radiated to his testicles.  He reports his testicles have come together. He reports he has been having the problems with his testicles intermittently since June 2018.  He has not taken any medication prior to arrival.  No diarrhea or urinary symptoms.  HPI  History reviewed. No pertinent past medical history.  Patient Active Problem List   Diagnosis Date Noted  . SBO (small bowel obstruction) ([place]) 08/13/2017  . Right lower quadrant abdominal abscess ([place]) 01/26/2017    History reviewed. No pertinent surgical history.      Home Medications    Prior to Admission medications   Not on File    Family History History reviewed. No pertinent family history.  Social History Social History   Tobacco Use  . Smoking status: Current Every Day Smoker    Packs/day: 1.00    Types: Cigarettes  . Smokeless tobacco: Never Used  Substance Use Topics  . Alcohol use: Yes    Comment: occ  . Drug use: No     Allergies     Asa [aspirin]   Review of Systems Review of Systems  Constitutional: Positive for appetite change and chills. Negative for fever.  HENT: Negative for facial swelling and sore throat.   Respiratory: Positive for shortness of breath.   Cardiovascular: Positive for chest pain and palpitations.  Gastrointestinal: Positive for abdominal pain, nausea and vomiting. Negative for diarrhea.  Genitourinary: Positive for scrotal swelling and testicular pain. Negative for dysuria.  Musculoskeletal: Negative for back pain.  Skin: Negative for rash and wound.  Neurological: Negative for headaches.  Psychiatric/Behavioral: The patient is not nervous/anxious.      Physical Exam Updated Vital Signs BP 116/74 (BP Location: Right Arm)   Pulse 100   Temp 98.6 F (37 C) (Oral)   Resp 16   Ht 5' 10"  (1.778 m)   Wt 63.5 kg (140 lb)   SpO2 100%   BMI 20.09 kg/m   Physical Exam  Constitutional: He appears well-developed and well-nourished. No distress.  HENT:  Head: Normocephalic and atraumatic.  Mouth/Throat: Oropharynx is clear and moist. No oropharyngeal exudate.  Eyes: Pupils are equal, round, and reactive to light. Conjunctivae are normal. Right eye exhibits no discharge. Left eye exhibits no discharge. No scleral icterus.  Neck: Normal range of motion. Neck supple. No thyromegaly present.  Cardiovascular: Normal rate, regular rhythm, normal heart sounds and intact distal pulses. Exam reveals no gallop and no friction rub.  No  murmur heard. Pulmonary/Chest: Effort normal and breath sounds normal. No stridor. No respiratory distress. He has no wheezes. He has no rales.  Abdominal: Soft. Bowel sounds are normal. He exhibits no distension. There is generalized tenderness. There is guarding and tenderness at McBurney's point. There is no rebound and no CVA tenderness.  Genitourinary:  Genitourinary Comments: Genital exam completed by Dr. Regenia Skeeter, per request of patient, who reported normal exam   Musculoskeletal: He exhibits no edema.  Lymphadenopathy:    He has no cervical adenopathy.  Neurological: He is alert. Coordination normal.  Skin: Skin is warm and dry. No rash noted. He is not diaphoretic. No pallor.  Psychiatric: He has a normal mood and affect.  Nursing note and vitals reviewed.    ED Treatments / Results  Labs (all labs ordered are listed, but only abnormal results are displayed) Labs Reviewed  COMPREHENSIVE METABOLIC PANEL - Abnormal; Notable for the following components:      Result Value   Glucose, Bld 114 (*)    Total Bilirubin 1.3 (*)    All other components within normal limits  CBC - Abnormal; Notable for the following components:   Hemoglobin 17.2 (*)    All other components within normal limits  LIPASE, BLOOD  TROPONIN I  BRAIN NATRIURETIC PEPTIDE  LACTIC ACID, PLASMA  COMPREHENSIVE METABOLIC PANEL  CBC WITH DIFFERENTIAL/PLATELET    EKG EKG Interpretation  Date/Time:  Tuesday August 13 2017 19:05:14 EDT Ventricular Rate:  88 PR Interval:  164 QRS Duration: 78 QT Interval:  342 QTC Calculation: 413 R Axis:   61 Text Interpretation:  Normal sinus rhythm Right atrial enlargement no acute ST/T changes no significant change since Sept 2018 Confirmed by Sherwood Gambler [phone]) on 08/13/2017 9:11:12 PM   Radiology Ct Abdomen Pelvis W Contrast  Result Date: 08/13/2017 CLINICAL DATA:  Generalized abdominal pain with nausea, vomiting, and flank pain for 2 days. EXAM: CT ABDOMEN AND PELVIS WITH CONTRAST TECHNIQUE: Multidetector CT imaging of the abdomen and pelvis was performed using the standard protocol following bolus administration of intravenous contrast. CONTRAST:  164m ISOVUE-300 IOPAMIDOL (ISOVUE-300) INJECTION 61% COMPARISON:  03/27/2017 FINDINGS: Lower chest: Clear lung bases. Hepatobiliary: Subcentimeter hypodensity in the lateral segment of the left hepatic lobe, too small to fully characterize. Unremarkable gallbladder. No biliary  dilatation. Pancreas: Unremarkable. Spleen: Unremarkable. Adrenals/Urinary Tract: Unremarkable adrenal glands. No evidence of renal mass, calculi, or hydronephrosis. Unremarkable bladder. Stomach/Bowel: The stomach is within normal limits. There are multiple fluid-filled loops of dilated mid and distal small bowel which measure up to 3.2 cm in diameter. The terminal ileum is collapsed and may be diffusely thick walled. The colon is nondilated, as are proximal small bowel loops. The appendix is not clearly identified, although there is some soft tissue thickening in the right lower quadrant which extends inferiorly in the pelvis towards the rectum as previously seen. No residual right lower quadrant abscess is identified. Vascular/Lymphatic: Abdominal aortic atherosclerosis without aneurysm. No enlarged lymph nodes. Reproductive: Unremarkable prostate. Other: New small volume ascites. Enlargement of mesenteric/omental vessels most notable along the transverse colon. No loculated fluid collection. Musculoskeletal: No acute osseous abnormality or suspicious osseous lesion. IMPRESSION: 1. Relatively diffuse small bowel dilatation consistent with obstruction. Transition point in the right lower quadrant where the terminal ileum is decompressed and thick-walled which may reflect ileitis. Appendix not identified. 2. New small volume ascites.  No abscess identified. Electronically Signed   By: ALogan BoresM.D.   On: 08/13/2017 19:00    Procedures  Procedures (including critical care time)  Medications Ordered in ED Medications  0.9 %  sodium chloride infusion ( Intravenous New Bag/Given 08/13/17 2208)  acetaminophen (TYLENOL) tablet 650 mg (has no administration in time range)    Or  acetaminophen (TYLENOL) suppository 650 mg (has no administration in time range)  ondansetron (ZOFRAN) tablet 4 mg (has no administration in time range)    Or  ondansetron (ZOFRAN) injection 4 mg (has no administration in time  range)  fentaNYL (SUBLIMAZE) injection 25-50 mcg (50 mcg Intravenous Given 08/13/17 2224)  metroNIDAZOLE (FLAGYL) IVPB 500 mg (0 mg Intravenous Stopped 08/14/17 0028)  cefTRIAXone (ROCEPHIN) 2 g in sodium chloride 0.9 % 100 mL IVPB (has no administration in time range)  ondansetron (ZOFRAN-ODT) disintegrating tablet 4 mg (4 mg Oral Given 08/13/17 1742)  morphine 4 MG/ML injection 4 mg (4 mg Intravenous Given 08/13/17 1754)  iopamidol (ISOVUE-300) 61 % injection (100 mLs  Contrast Given 08/13/17 1806)  sodium chloride 0.9 % bolus 1,000 mL (0 mLs Intravenous Stopped 08/13/17 2041)     Initial Impression / Assessment and Plan / ED Course  I have reviewed the triage vital signs and the nursing notes.  Pertinent labs & imaging results that were available during my care of the patient were reviewed by me and considered in my medical decision making (see chart for details).     Patient with abdominal pain, nausea, and vomiting.  CT abdomen pelvis shows relatively diffuse small bowel dilatation consistent with obstruction.  Transition point in the right lower quadrant where the terminal ileum is decompressed and thick walled which may reflect ileitis.  Appendix not identified.  New small volume ascites, but no abscess identified.  Hemoglobin 1.3, glucose 114, otherwise labs unremarkable.  17.2, bilirubin 1.3, glucose 114, otherwise labs unremarkable. I consulted Dr. Kae Heller with general surgery who will evaluate the patient but recommends admission to medicine for further workup for IBD, fluids, and empiric antibiotics. She will evaluate the patient. I consulted [facility] and spoke with Dr. Myna Hidalgo who will admit the patient for further evaluation and treatment.  I appreciate both consultants.  Patient also evaluated by Dr. Regenia Skeeter who guided the patient's management and agrees with plan.  Final Clinical Impressions(s) / ED Diagnoses   Final diagnoses:  SBO (small bowel obstruction) [facility])    ED  Discharge Orders    None       Frederica Kuster, PA-C 08/13/17 2331    Sherwood Gambler, MD 08/13/17 2358

## 2017-08-13 NOTE — ED Triage Notes (Signed)
Pt endorses generalized abd pain with n/v and flank pain x 2 days. VSS.

## 2017-08-13 NOTE — Progress Notes (Signed)
Pharmacy Antibiotic Note  Derek Lloyd is a 47 y.o. male admitted on 08/13/2017 with abdominal pain.  Pharmacy has been consulted for rocephin dosing.  Plan: Rocephin 2gm IV q24 hours F/u cultures and clinical course  Height: 5' 10"  (177.8 cm) Weight: 140 lb (63.5 kg) IBW/kg (Calculated) : 73  Temp (24hrs), Avg:98.4 F (36.9 C), Min:98.1 F (36.7 C), Max:98.6 F (37 C)  Recent Labs  Lab 08/13/17 1429 08/13/17 2145  WBC 8.7  --   CREATININE 0.87  --   LATICACIDVEN  --  0.7    Estimated Creatinine Clearance: 97.3 mL/min (by C-G formula based on SCr of 0.87 mg/dL).    Allergies  Allergen Reactions  . Asa [Aspirin] Rash    Childhood reaction; but tolerates well now    Antimicrobials this admission: Rocephin 3/26  >>  flagyl 3/26 >>   Thank you for allowing pharmacy to be a part of this patient's care.  Excell Seltzer Poteet 08/13/2017 11:19 PM

## 2017-08-14 ENCOUNTER — Encounter (HOSPITAL_COMMUNITY): Payer: Self-pay | Admitting: General Practice

## 2017-08-14 ENCOUNTER — Inpatient Hospital Stay (HOSPITAL_COMMUNITY): Payer: BLUE CROSS/BLUE SHIELD

## 2017-08-14 ENCOUNTER — Other Ambulatory Visit: Payer: Self-pay

## 2017-08-14 DIAGNOSIS — K56609 Unspecified intestinal obstruction, unspecified as to partial versus complete obstruction: Secondary | ICD-10-CM

## 2017-08-14 LAB — CBC WITH DIFFERENTIAL/PLATELET
BASOS ABS: 0 10*3/uL (ref 0.0–0.1)
Basophils Relative: 0 %
EOS ABS: 0.2 10*3/uL (ref 0.0–0.7)
EOS PCT: 3 %
HCT: 46.5 % (ref 39.0–52.0)
Hemoglobin: 15.2 g/dL (ref 13.0–17.0)
Lymphocytes Relative: 21 %
Lymphs Abs: 1.7 10*3/uL (ref 0.7–4.0)
MCH: 31.5 pg (ref 26.0–34.0)
MCHC: 32.7 g/dL (ref 30.0–36.0)
MCV: 96.5 fL (ref 78.0–100.0)
MONO ABS: 1.1 10*3/uL — AB (ref 0.1–1.0)
Monocytes Relative: 13 %
Neutro Abs: 5 10*3/uL (ref 1.7–7.7)
Neutrophils Relative %: 63 %
PLATELETS: 311 10*3/uL (ref 150–400)
RBC: 4.82 MIL/uL (ref 4.22–5.81)
RDW: 13.2 % (ref 11.5–15.5)
WBC: 8 10*3/uL (ref 4.0–10.5)

## 2017-08-14 LAB — COMPREHENSIVE METABOLIC PANEL
ALT: 31 U/L (ref 17–63)
AST: 23 U/L (ref 15–41)
Albumin: 3.2 g/dL — ABNORMAL LOW (ref 3.5–5.0)
Alkaline Phosphatase: 63 U/L (ref 38–126)
Anion gap: 10 (ref 5–15)
BUN: 7 mg/dL (ref 6–20)
CALCIUM: 8.4 mg/dL — AB (ref 8.9–10.3)
CO2: 21 mmol/L — ABNORMAL LOW (ref 22–32)
CREATININE: 0.81 mg/dL (ref 0.61–1.24)
Chloride: 106 mmol/L (ref 101–111)
GFR calc Af Amer: >60 mL/min (ref >60)
GLUCOSE: 90 mg/dL (ref 65–99)
POTASSIUM: 3.7 mmol/L (ref 3.5–5.1)
Sodium: 137 mmol/L (ref 135–145)
Total Bilirubin: 1.1 mg/dL (ref 0.3–1.2)
Total Protein: 5.7 g/dL — ABNORMAL LOW (ref 6.5–8.1)

## 2017-08-14 MED ORDER — HEPARIN SODIUM (PORCINE) 5000 UNIT/ML IJ SOLN
5000.0000 [IU] | Freq: Three times a day (TID) | INTRAMUSCULAR | Status: DC
  Administered 2017-08-15: 5000 [IU] via SUBCUTANEOUS
  Filled 2017-08-14 (×2): qty 1

## 2017-08-14 MED ORDER — OXYCODONE HCL 5 MG PO TABS: 5.0000 mg | ORAL_TABLET | ORAL | Status: DC | PRN

## 2017-08-14 MED ORDER — DEXTROSE-NACL 5-0.45 % IV SOLN
INTRAVENOUS | Status: DC
  Administered 2017-08-14: 11:00:00 via INTRAVENOUS

## 2017-08-14 NOTE — Progress Notes (Signed)
Patient is refusing Heparin and SCDs. RN explained the importance of preventing blood clots. Patient states he does not have circulation issues and does not want either Heparin or SCDs.

## 2017-08-14 NOTE — Progress Notes (Signed)
Subjective: Alert.  Stable.  Afebrile. Reports diffuse abdominal pain centralized. Requesting to eat a biscuit. Says last bowel movement was yesterday morning. Has never had a colonoscopy or GI workup by his history.  CT shows partial SBO, possible high-grade, presumed secondary to RLQ inflammatory process.  Exact etiology unclear.  No abscess or free air.  No free fluid.  Lab work this morning shows WBC 8000.  Hemoglobin 15.2.  Potassium 3.7.  LFTs normal.  Creatinine 0.81.  Objective: Vital signs in last 24 hours: Temp:  [97.7 F (36.5 C)-98.6 F (37 C)] 97.7 F (36.5 C) (03/27 0431) Pulse Rate:  [90-104] 104 (03/27 0431) Resp:  [16-17] 17 (03/27 0431) BP: (116-120)/(74-82) 120/82 (03/27 0431) SpO2:  [99 %-100 %] 99 % (03/27 0431) Weight:  [63.5 kg (140 lb)] 63.5 kg (140 lb) (03/26 2216) Last BM Date: 08/13/17  Intake/Output from previous day: 03/26 0701 - 03/27 0700 In: 1388.8 [I.V.:188.8; IV Piggyback:1200] Out: -  Intake/Output this shift: Total I/O In: 1388.8 [I.V.:188.8; IV Piggyback:1200] Out: -   General appearance: Alert.  Minimal distress.  Thin body habitus. Resp: clear to auscultation bilaterally GI: Abdomen somewhat distended and tympanitic.  Bowel sounds present and somewhat obstructive sounding.  Diffuse tenderness without peritonitis.  No mass or hernia noted. Extremities: extremities normal, atraumatic, no cyanosis or edema Skin: Skin color, texture, turgor normal. No rashes or lesions ... Skin warm and dry.  Lab Results:  Recent Labs    08/13/17 1429 08/14/17 0418  WBC 8.7 8.0  HGB 17.2* 15.2  HCT 50.9 46.5  PLT 365 311   BMET Recent Labs    08/13/17 1429 08/14/17 0418  NA 137 137  K 4.2 3.7  CL 104 106  CO2 23 21*  GLUCOSE 114* 90  BUN 11 7  CREATININE 0.87 0.81  CALCIUM 9.4 8.4*   PT/INR No results for input(s): LABPROT, INR in the last 72 hours. ABG No results for input(s): PHART, HCO3 in the last 72 hours.  Invalid  input(s): PCO2, PO2  Studies/Results: Ct Abdomen Pelvis W Contrast  Result Date: 08/13/2017 CLINICAL DATA:  Generalized abdominal pain with nausea, vomiting, and flank pain for 2 days. EXAM: CT ABDOMEN AND PELVIS WITH CONTRAST TECHNIQUE: Multidetector CT imaging of the abdomen and pelvis was performed using the standard protocol following bolus administration of intravenous contrast. CONTRAST:  112m ISOVUE-300 IOPAMIDOL (ISOVUE-300) INJECTION 61% COMPARISON:  03/27/2017 FINDINGS: Lower chest: Clear lung bases. Hepatobiliary: Subcentimeter hypodensity in the lateral segment of the left hepatic lobe, too small to fully characterize. Unremarkable gallbladder. No biliary dilatation. Pancreas: Unremarkable. Spleen: Unremarkable. Adrenals/Urinary Tract: Unremarkable adrenal glands. No evidence of renal mass, calculi, or hydronephrosis. Unremarkable bladder. Stomach/Bowel: The stomach is within normal limits. There are multiple fluid-filled loops of dilated mid and distal small bowel which measure up to 3.2 cm in diameter. The terminal ileum is collapsed and may be diffusely thick walled. The colon is nondilated, as are proximal small bowel loops. The appendix is not clearly identified, although there is some soft tissue thickening in the right lower quadrant which extends inferiorly in the pelvis towards the rectum as previously seen. No residual right lower quadrant abscess is identified. Vascular/Lymphatic: Abdominal aortic atherosclerosis without aneurysm. No enlarged lymph nodes. Reproductive: Unremarkable prostate. Other: New small volume ascites. Enlargement of mesenteric/omental vessels most notable along the transverse colon. No loculated fluid collection. Musculoskeletal: No acute osseous abnormality or suspicious osseous lesion. IMPRESSION: 1. Relatively diffuse small bowel dilatation consistent with obstruction. Transition point in the  right lower quadrant where the terminal ileum is decompressed and  thick-walled which may reflect ileitis. Appendix not identified. 2. New small volume ascites.  No abscess identified. Electronically Signed   By: Logan Bores M.D.   On: 08/13/2017 19:00    Anti-infectives: Anti-infectives (From admission, onward)   Start     Dose/Rate Route Frequency Ordered Stop   08/13/17 2330  cefTRIAXone (ROCEPHIN) 2 g in sodium chloride 0.9 % 100 mL IVPB     2 g 200 mL/hr over 30 Minutes Intravenous Every 24 hours 08/13/17 2318     08/13/17 2315  metroNIDAZOLE (FLAGYL) IVPB 500 mg     500 mg 100 mL/hr over 60 Minutes Intravenous Every 8 hours 08/13/17 2305        Assessment/Plan:  Partial SBO, likely secondary to terminal ileitis or, less likely, other inflammatory process in RLQ. -This is a recurrent problem and has not followed up well on recommendations for evaluation.  This time he wants to stay and have it evaluated. -Bowel rest -IV antibiotics -check abdominal x-ray this morning. -GI consultation -NG tube if vomits  -at this point in time there is no acute indication for surgical intervention.  That would be reserved for unresolved SBO or other complications such as perforation.  Daily tobacco abuse  LOS: 1 day    Adin Hector 08/14/2017

## 2017-08-14 NOTE — Progress Notes (Signed)
Patient Demographics:    Derek Lloyd, is a 47 y.o. male, DOB - Jan 28, 1971, TGY:[id number]  Admit date - 08/13/2017   Admitting Physician Vianne Bulls, MD  Outpatient Primary MD for the patient is Patient, [name]  LOS - 1   Chief Complaint  Patient presents with  . Abdominal Pain        Subjective:    Derek Lloyd today has no fevers, no emesis,  No chest pain, abdominal pain improving, patient is unhappy and very fixated on being fed, rationale for n.p.o. status explained to patient by myself and RN at bedside, subsequently patient called again that he wanted to talk to me about being able to eat, rationale for n.p.o. status explained to patient again by myself and RN at bedside,  Assessment  & Plan :    Principal Problem:   SBO (small bowel obstruction) [facility])  Brief summary 47 year old Caucasian male admitted on 08/13/2017 with abdominal pain, nausea and emesis without blood or bile with  CT of the abdomen and pelvis showing diffuse small bowel dilation consistent with obstruction, likely transition point in the right lower quadrant where the terminal ileum appears thick walled and decompressed, possibly reflecting ileitis.  He experienced similar symptoms approximately 6 months ago, was admitted to the hospital at that time with apparent abscess in the right lower quadrant, possibly related to the appendix or terminal ileum, and was to have IR drainage but he left AMA prior to this.  Symptoms eventually resolved at home    Plan:-   1)PSBO-surgical consult appreciated, patient is unhappy and very fixated on being fed, rationale for n.p.o. status explained to patient by myself and RN at bedside, subsequently patient called again that he wanted to talk to me about being able to eat, rationale for n.p.o. status explained to patient again by myself and RN at bedside, continue as needed Zofran place  NG tube if persistent emesis, continue IV Rocephin and Flagyl for possible terminal ileitis.  No leukocytosis at this time , p.o. oxycodone or IV fentanyl as needed pain  Code Status : Full   Disposition Plan  : home  Consults  :  gen surg   DVT Prophylaxis  :   Heparin  /scd  Lab Results  Component Value Date   PLT 311 08/14/2017    Inpatient Medications  Scheduled Meds: Continuous Infusions: . cefTRIAXone (ROCEPHIN)  IV Stopped (08/14/17 0021)  . dextrose 5 % and 0.45% NaCl 125 mL/hr at 08/14/17 1114  . metronidazole 500 mg (08/14/17 1500)   PRN Meds:.acetaminophen **OR** acetaminophen, fentaNYL (SUBLIMAZE) injection, ondansetron **OR** ondansetron (ZOFRAN) IV    Anti-infectives (From admission, onward)   Start     Dose/Rate Route Frequency Ordered Stop   08/13/17 2330  cefTRIAXone (ROCEPHIN) 2 g in sodium chloride 0.9 % 100 mL IVPB     2 g 200 mL/hr over 30 Minutes Intravenous Every 24 hours 08/13/17 2318     08/13/17 2315  metroNIDAZOLE (FLAGYL) IVPB 500 mg     500 mg 100 mL/hr over 60 Minutes Intravenous Every 8 hours 08/13/17 2305          Objective:   Vitals:   08/13/17 2124 08/13/17 2216 08/14/17 0431 08/14/17 1334  BP:  117/80 116/74 120/82 111/74  Pulse: 90 100 (!) 104 76  Resp: 16 16 17 18   Temp:  98.6 F (37 C) 97.7 F (36.5 C) 97.8 F (36.6 C)  TempSrc:  Oral Oral Oral  SpO2: 100% 100% 99% 99%  Weight:  63.5 kg (140 lb)    Height:  5' 10"  (1.778 m)      Wt Readings from Last 3 Encounters:  08/13/17 63.5 kg (140 lb)  05/06/17 63.5 kg (140 lb)  01/26/17 61.2 kg (135 lb)     Intake/Output Summary (Last 24 hours) at 08/14/2017 1546 Last data filed at 08/14/2017 1505 Gross per 24 hour  Intake 2849.25 ml  Output -  Net 2849.25 ml     Physical Exam  Gen:- Awake Alert, unhappy about lack of food HEENT:- [place].AT, No sclera icterus Neck-Supple Neck,No JVD,.  Lungs-  CTAB , good air movement bilaterally CV- S1, S2 normal Abd-  +ve B.Sounds,  Abd Soft, no abdominal tenderness, right more than left, no rebound or guarding Extremity/Skin:- No  edema,   good pulses Psych-affect is appropriate, oriented x3 Neuro-no new focal deficits, no tremors   Data Review:   Micro Results No results found for this or any previous visit (from the past 240 hour(s)).  Radiology Reports Ct Abdomen Pelvis W Contrast  Result Date: 08/13/2017 CLINICAL DATA:  Generalized abdominal pain with nausea, vomiting, and flank pain for 2 days. EXAM: CT ABDOMEN AND PELVIS WITH CONTRAST TECHNIQUE: Multidetector CT imaging of the abdomen and pelvis was performed using the standard protocol following bolus administration of intravenous contrast. CONTRAST:  141m ISOVUE-300 IOPAMIDOL (ISOVUE-300) INJECTION 61% COMPARISON:  03/27/2017 FINDINGS: Lower chest: Clear lung bases. Hepatobiliary: Subcentimeter hypodensity in the lateral segment of the left hepatic lobe, too small to fully characterize. Unremarkable gallbladder. No biliary dilatation. Pancreas: Unremarkable. Spleen: Unremarkable. Adrenals/Urinary Tract: Unremarkable adrenal glands. No evidence of renal mass, calculi, or hydronephrosis. Unremarkable bladder. Stomach/Bowel: The stomach is within normal limits. There are multiple fluid-filled loops of dilated mid and distal small bowel which measure up to 3.2 cm in diameter. The terminal ileum is collapsed and may be diffusely thick walled. The colon is nondilated, as are proximal small bowel loops. The appendix is not clearly identified, although there is some soft tissue thickening in the right lower quadrant which extends inferiorly in the pelvis towards the rectum as previously seen. No residual right lower quadrant abscess is identified. Vascular/Lymphatic: Abdominal aortic atherosclerosis without aneurysm. No enlarged lymph nodes. Reproductive: Unremarkable prostate. Other: New small volume ascites. Enlargement of mesenteric/omental vessels most notable along the  transverse colon. No loculated fluid collection. Musculoskeletal: No acute osseous abnormality or suspicious osseous lesion. IMPRESSION: 1. Relatively diffuse small bowel dilatation consistent with obstruction. Transition point in the right lower quadrant where the terminal ileum is decompressed and thick-walled which may reflect ileitis. Appendix not identified. 2. New small volume ascites.  No abscess identified. Electronically Signed   By: ALogan BoresM.D.   On: 08/13/2017 19:00   Dg Abd 2 Views  Result Date: 08/14/2017 CLINICAL DATA:  Abdominal pain. EXAM: ABDOMEN - 2 VIEW COMPARISON:  CT scan of August 13, 2017. FINDINGS: Mild small bowel dilatation is noted concerning for ileus or distal small bowel obstruction. Stool is noted in the colon. There is no evidence of free air. No radio-opaque calculi or other significant radiographic abnormality is seen. IMPRESSION: Mild small bowel dilatation concerning for ileus or distal small bowel obstruction. Continued radiographic follow-up is recommended. Electronically Signed  By: Marijo Conception, M.D.   On: 08/14/2017 09:52     CBC Recent Labs  Lab 08/13/17 1429 08/14/17 0418  WBC 8.7 8.0  HGB 17.2* 15.2  HCT 50.9 46.5  PLT 365 311  MCV 96.4 96.5  MCH 32.6 31.5  MCHC 33.8 32.7  RDW 13.3 13.2  LYMPHSABS  --  1.7  MONOABS  --  1.1*  EOSABS  --  0.2  BASOSABS  --  0.0    Chemistries  Recent Labs  Lab 08/13/17 1429 08/14/17 0418  NA 137 137  K 4.2 3.7  CL 104 106  CO2 23 21*  GLUCOSE 114* 90  BUN 11 7  CREATININE 0.87 0.81  CALCIUM 9.4 8.4*  AST 35 23  ALT 45 31  ALKPHOS 81 63  BILITOT 1.3* 1.1   ------------------------------------------------------------------------------------------------------------------ No results for input(s): CHOL, HDL, LDLCALC, TRIG, CHOLHDL, LDLDIRECT in the last 72 hours.  No results found for:  HGBA1C ------------------------------------------------------------------------------------------------------------------ No results for input(s): TSH, T4TOTAL, T3FREE, THYROIDAB in the last 72 hours.  Invalid input(s): FREET3 ------------------------------------------------------------------------------------------------------------------ No results for input(s): VITAMINB12, FOLATE, FERRITIN, TIBC, IRON, RETICCTPCT in the last 72 hours.  Coagulation profile No results for input(s): INR, PROTIME in the last 168 hours.  No results for input(s): DDIMER in the last 72 hours.  Cardiac Enzymes Recent Labs  Lab 08/13/17 1754  TROPONINI <0.03   ------------------------------------------------------------------------------------------------------------------    Component Value Date/Time   BNP 14.2 08/13/2017 1754    Caspar Favila M.D on 08/14/2017 at 3:46 PM  Between 7am to 7pm - Pager - [phone]  After 7pm go to [email or web address] - password [email or web address]  [facility] -  Office  [phone]   Voice Recognition Viviann Spare dictation system was used to create this note, attempts have been made to correct errors. Please contact the author with questions and/or clarifications.

## 2017-08-15 ENCOUNTER — Encounter (HOSPITAL_COMMUNITY): Payer: Self-pay | Admitting: *Deleted

## 2017-08-15 ENCOUNTER — Inpatient Hospital Stay (HOSPITAL_COMMUNITY): Payer: BLUE CROSS/BLUE SHIELD

## 2017-08-15 MED ORDER — METHYLPREDNISOLONE SODIUM SUCC 40 MG IJ SOLR
40.0000 mg | Freq: Two times a day (BID) | INTRAMUSCULAR | Status: DC
  Administered 2017-08-15: 40 mg via INTRAVENOUS
  Filled 2017-08-15: qty 1

## 2017-08-15 MED ORDER — PHENOL 1.4 % MT LIQD
1.0000 | OROMUCOSAL | Status: DC | PRN
  Filled 2017-08-15: qty 177

## 2017-08-15 NOTE — Progress Notes (Signed)
Tried to inserrt gastric tube but patient cannot tolerate it. Refused gastric tube insertion. Advised to walk , patient started walking at this time

## 2017-08-15 NOTE — Progress Notes (Addendum)
CC:  Abdominal pain/distension  Subjective: No real change.  His abdomen still distended somewhat tender.  He does not want you palpating all.  He is also very shy.  Objective: Vital signs in last 24 hours: Temp:  [97.8 F (36.6 C)-97.9 F (36.6 C)] 97.8 F (36.6 C) (03/28 0531) Pulse Rate:  [71-76] 71 (03/28 0531) Resp:  [18] 18 (03/28 0531) BP: (101-111)/(64-74) 101/64 (03/28 0531) SpO2:  [99 %] 99 % (03/28 0531) Last BM Date: 08/14/17 NPO 1900 IV Voided x 3 No Bm recorded Afebrile, VSS No labs this AM AXR this AM:  Unchanged mildly distended small bowel loops which may represent small bowel obstruction as identified on recent CT. No significant change except for increased gas in the colon and rectum.  Intake/Output from previous day: 03/27 0701 - 03/28 0700 In: 1931.3 [I.V.:1731.3; IV Piggyback:200] Out: -  Intake/Output this shift: No intake/output data recorded.  General appearance: alert, cooperative and no distress Resp: clear to auscultation bilaterally GI: Distended, tender, few hyperactive bowel sounds, positive flatus no BM.  Lab Results:  Recent Labs    08/13/17 1429 08/14/17 0418  WBC 8.7 8.0  HGB 17.2* 15.2  HCT 50.9 46.5  PLT 365 311    BMET Recent Labs    08/13/17 1429 08/14/17 0418  NA 137 137  K 4.2 3.7  CL 104 106  CO2 23 21*  GLUCOSE 114* 90  BUN 11 7  CREATININE 0.87 0.81  CALCIUM 9.4 8.4*   PT/INR No results for input(s): LABPROT, INR in the last 72 hours.  Recent Labs  Lab 08/13/17 1429 08/14/17 0418  AST 35 23  ALT 45 31  ALKPHOS 81 63  BILITOT 1.3* 1.1  PROT 7.2 5.7*  ALBUMIN 4.2 3.2*     Lipase     Component Value Date/Time   LIPASE 28 08/13/2017 1429   Prior to Admission medications   Not on File      Medications: . heparin injection (subcutaneous)  5,000 Units Subcutaneous Q8H   . cefTRIAXone (ROCEPHIN)  IV Stopped (08/14/17 2341)  . dextrose 5 % and 0.45% NaCl 125 mL/hr at 08/15/17 0500  .  metronidazole 500 mg (08/15/17 0721)   Anti-infectives (From admission, onward)   Start     Dose/Rate Route Frequency Ordered Stop   08/13/17 2330  cefTRIAXone (ROCEPHIN) 2 g in sodium chloride 0.9 % 100 mL IVPB     2 g 200 mL/hr over 30 Minutes Intravenous Every 24 hours 08/13/17 2318     08/13/17 2315  metroNIDAZOLE (FLAGYL) IVPB 500 mg     500 mg 100 mL/hr over 60 Minutes Intravenous Every 8 hours 08/13/17 2305        Assessment/Plan  Partial SBO, likely secondary to terminal ileitis or, less likely, other inflammatory process in RLQ. -This is a recurrent problem and has not followed up well on recommendations for evaluation.  This time he wants to stay and have it evaluated. -Bowel rest -IV antibiotics -check abdominal x-ray this morning. -GI consultation -NG tube if vomits  -at this point in time there is no acute indication for surgical intervention.  That would be reserved for unresolved SBO or other complications such as perforation.  Daily tobacco abuse  FEN:  IV fluids/NPO ID:  Flagyl/Rocephin 3/26 =>> day 3 DVT: Heparin Follow up:  TBD  Plan:  Continue antibiotics and await GI consult.  No surgical treatment needed at this point.  Pt seen by Dr. Dalbert Batman and he wants  pt to have an NG tube so we will get one this AM.  recheck  Film in AM.    LOS: 2 days    Derek Lloyd 08/15/2017 [phone]

## 2017-08-15 NOTE — Consult Note (Signed)
[facility] Consultation Note  Referring Provider: Dr. Denton Brick [facility]) Primary Care Physician:  Patient, [name]  Reason for Consultation:  Bowel obstruction, ileitis  HPI: Derek Lloyd is a 47 y.o. male admitted for small bowel obstruction.  Has history non-compliance, had abdominal abscess about 6 months ago, left ama during that admission.  He has ongoing nausea, vomiting, distention since admission, supportive care, antibiotics.  Had CT showing terminal ileitis with small bowel dilatation consistent with obstruction.  Has intermittent melena and hematochezia for the past several months.  No prior endoscopy or colonoscopy.  There are several discrepant histories; patient told my PA student he has not had bowel movements with intermittent flatus, but patient tells me, after several repeated questions, that he has had bowel movements every day for the past three days (although today's was less in volume because he "hasn't been eating) and has been passing gas regularly.  He has reported nausea and vomiting to others, but tells me he is not vomiting or nauseated.   Past Medical History:  Diagnosis Date  . Anxiety attack   . Headache    "frequently" (08/14/2017)  . Heart murmur    "when I was a kid" (08/14/2017)  . Small bowel obstruction ([place]) 08/13/2017    Past Surgical History:  Procedure Laterality Date  . FINGER SURGERY Left    "thumb"  . TYMPANOSTOMY TUBE PLACEMENT Bilateral     Prior to Admission medications   Not on File    Current Facility-Administered Medications  Medication Dose Route Frequency Provider Last Rate Last Dose  . acetaminophen (TYLENOL) tablet 650 mg  650 mg Oral Q6H PRN Opyd, Ilene Qua, MD       Or  . acetaminophen (TYLENOL) suppository 650 mg  650 mg Rectal Q6H PRN Opyd, Ilene Qua, MD      . cefTRIAXone (ROCEPHIN) 2 g in sodium chloride 0.9 % 100 mL IVPB  2 g Intravenous Q24H Opyd, Ilene Qua, MD   Stopped at 08/14/17 2341  . dextrose 5 %-0.45  % sodium chloride infusion   Intravenous Continuous Emokpae, Courage, MD 125 mL/hr at 08/15/17 0500    . fentaNYL (SUBLIMAZE) injection 25-50 mcg  25-50 mcg Intravenous Q2H PRN Opyd, Ilene Qua, MD   50 mcg at 08/15/17 0840  . heparin injection 5,000 Units  5,000 Units Subcutaneous Q8H Emokpae, Courage, MD      . metroNIDAZOLE (FLAGYL) IVPB 500 mg  500 mg Intravenous Q8H Opyd, Ilene Qua, MD   Stopped at 08/15/17 0900  . ondansetron (ZOFRAN) tablet 4 mg  4 mg Oral Q6H PRN Opyd, Ilene Qua, MD       Or  . ondansetron (ZOFRAN) injection 4 mg  4 mg Intravenous Q6H PRN Opyd, Ilene Qua, MD   4 mg at 08/14/17 0701  . oxyCODONE (Oxy IR/ROXICODONE) immediate release tablet 5 mg  5 mg Oral Q4H PRN Emokpae, Courage, MD      . phenol (CHLORASEPTIC) mouth spray 1 spray  1 spray Mouth/Throat PRN Earnstine Regal, PA-C        Allergies as of 08/13/2017 - Review Complete 08/13/2017  Allergen Reaction Noted  . Asa [aspirin] Rash 11/13/2014    History reviewed. No pertinent family history.  Social History   Socioeconomic History  . Marital status: Single    Spouse name: Not on file  . Number of children: Not on file  . Years of education: Not on file  . Highest education level: Not on file  Occupational History  .  Not on file  Social Needs  . Financial resource strain: Not on file  . Food insecurity:    Worry: Not on file    Inability: Not on file  . Transportation needs:    Medical: Not on file    Non-medical: Not on file  Tobacco Use  . Smoking status: Former Smoker    Packs/day: 1.00    Types: Cigarettes  . Smokeless tobacco: Never Used  . Tobacco comment: 08/14/2017 "I quit; can't tell you when I quit or when I started"  Substance and Sexual Activity  . Alcohol use: Yes    Comment: 08/14/2017 "I drink when I feel like it; nothing recently"  . Drug use: No  . Sexual activity: Not on file  Lifestyle  . Physical activity:    Days per week: Not on file    Minutes per session: Not on file   . Stress: Not on file  Relationships  . Social connections:    Talks on phone: Not on file    Gets together: Not on file    Attends religious service: Not on file    Active member of club or organization: Not on file    Attends meetings of clubs or organizations: Not on file    Relationship status: Not on file  . Intimate partner violence:    Fear of current or ex partner: Not on file    Emotionally abused: Not on file    Physically abused: Not on file    Forced sexual activity: Not on file  Other Topics Concern  . Not on file  Social History Narrative  . Not on file    Review of Systems: as per HPI, all others negative  Physical Exam: Vital signs in last 24 hours: Temp:  [97.8 F (36.6 C)-97.9 F (36.6 C)] 97.8 F (36.6 C) (03/28 0531) Pulse Rate:  [71-76] 71 (03/28 0531) Resp:  [18] 18 (03/28 0531) BP: (101-111)/(64-74) 101/64 (03/28 0531) SpO2:  [99 %] 99 % (03/28 0531) Last BM Date: 08/14/17 General:   Alert, thin and somewhat cachectic-appearing, NAD Head:  Normocephalic and atraumatic. Eyes:  Sclera clear, no icterus.   Conjunctiva pink. Ears:  Normal auditory acuity. Nose:  No deformity, discharge,  or lesions. Mouth:  No deformity or lesions.  Oropharynx pink & moist. Neck:  Supple; no masses or thyromegaly. Lungs:  Clear throughout to auscultation.   No wheezes, crackles, or rhonchi. No acute distress. Heart:  Regular rate and rhythm; no murmurs, clicks, rubs,  or gallops. Abdomen:  Distended, non-tender, tympanic. No masses, hepatosplenomegaly or hernias noted. Normal bowel sounds, without guarding, and without rebound.     Msk:  Symmetrical without gross deformities. Normal posture. Pulses:  Normal pulses noted. Extremities:  Without clubbing or edema. Neurologic:  Alert and  oriented x4;  grossly normal neurologically. Skin:  Intact without significant lesions or rashes. Psych:  Alert and cooperative. Normal mood and affect.   Lab Results: Recent Labs     08/13/17 1429 08/14/17 0418  WBC 8.7 8.0  HGB 17.2* 15.2  HCT 50.9 46.5  PLT 365 311   BMET Recent Labs    08/13/17 1429 08/14/17 0418  NA 137 137  K 4.2 3.7  CL 104 106  CO2 23 21*  GLUCOSE 114* 90  BUN 11 7  CREATININE 0.87 0.81  CALCIUM 9.4 8.4*   LFT Recent Labs    08/14/17 0418  PROT 5.7*  ALBUMIN 3.2*  AST 23  ALT 31  ALKPHOS 63  BILITOT 1.1   PT/INR No results for input(s): LABPROT, INR in the last 72 hours.  Studies/Results: Ct Abdomen Pelvis W Contrast  Result Date: 08/13/2017 CLINICAL DATA:  Generalized abdominal pain with nausea, vomiting, and flank pain for 2 days. EXAM: CT ABDOMEN AND PELVIS WITH CONTRAST TECHNIQUE: Multidetector CT imaging of the abdomen and pelvis was performed using the standard protocol following bolus administration of intravenous contrast. CONTRAST:  155m ISOVUE-300 IOPAMIDOL (ISOVUE-300) INJECTION 61% COMPARISON:  03/27/2017 FINDINGS: Lower chest: Clear lung bases. Hepatobiliary: Subcentimeter hypodensity in the lateral segment of the left hepatic lobe, too small to fully characterize. Unremarkable gallbladder. No biliary dilatation. Pancreas: Unremarkable. Spleen: Unremarkable. Adrenals/Urinary Tract: Unremarkable adrenal glands. No evidence of renal mass, calculi, or hydronephrosis. Unremarkable bladder. Stomach/Bowel: The stomach is within normal limits. There are multiple fluid-filled loops of dilated mid and distal small bowel which measure up to 3.2 cm in diameter. The terminal ileum is collapsed and may be diffusely thick walled. The colon is nondilated, as are proximal small bowel loops. The appendix is not clearly identified, although there is some soft tissue thickening in the right lower quadrant which extends inferiorly in the pelvis towards the rectum as previously seen. No residual right lower quadrant abscess is identified. Vascular/Lymphatic: Abdominal aortic atherosclerosis without aneurysm. No enlarged lymph nodes.  Reproductive: Unremarkable prostate. Other: New small volume ascites. Enlargement of mesenteric/omental vessels most notable along the transverse colon. No loculated fluid collection. Musculoskeletal: No acute osseous abnormality or suspicious osseous lesion. IMPRESSION: 1. Relatively diffuse small bowel dilatation consistent with obstruction. Transition point in the right lower quadrant where the terminal ileum is decompressed and thick-walled which may reflect ileitis. Appendix not identified. 2. New small volume ascites.  No abscess identified. Electronically Signed   By: ALogan BoresM.D.   On: 08/13/2017 19:00   Dg Abd 2 Views  Result Date: 08/14/2017 CLINICAL DATA:  Abdominal pain. EXAM: ABDOMEN - 2 VIEW COMPARISON:  CT scan of August 13, 2017. FINDINGS: Mild small bowel dilatation is noted concerning for ileus or distal small bowel obstruction. Stool is noted in the colon. There is no evidence of free air. No radio-opaque calculi or other significant radiographic abnormality is seen. IMPRESSION: Mild small bowel dilatation concerning for ileus or distal small bowel obstruction. Continued radiographic follow-up is recommended. Electronically Signed   By: JMarijo Conception M.D.   On: 08/14/2017 09:52   Dg Abd Portable 1v  Result Date: 08/15/2017 CLINICAL DATA:  Small bowel obstruction EXAM: PORTABLE ABDOMEN - 1 VIEW COMPARISON:  08/14/2017 radiographs and prior studies FINDINGS: Mildly distended gas-filled loops of small bowel are again noted. Increased gas in the colon and rectum noted. No other significant changes are identified. IMPRESSION: Unchanged mildly distended small bowel loops which may represent small bowel obstruction as identified on recent CT. No significant change except for increased gas in the colon and rectum. Electronically Signed   By: JMargarette CanadaM.D.   On: 08/15/2017 08:35   Impression:  1.  Small bowel obstruction.  Suspected Crohn's disease.  Variable reports:  To me, patient  reports passing regular flatus and stools every day for the past 3 days. 2.  Abnormal CT abdomen, ileitis, with small bowel dilatation.    Plan:  1.  Given no obvious infectious process (abscess) and high clinical suspicion for Crohn's disease, would start low-dose empiric IV steroids (solumedrol 40 mg IV every 12 hours). 2.  Diet status and possible NGT per surgical team. 4.  Continue antibiotics. 5.  Would not do colonoscopy at the present time, until such time as it is apparent he could tolerate the prep. 6.  Once patient has improvement of bowel obstruction, and doesn't leave ama in the meantime, would consider colonoscopy prior to discharge. 7.  [facility] will follow.   LOS: 2 days   Lillymae Duet M  08/15/2017, 10:59 AM  Cell [phone] If no answer or after 5 PM call [phone]

## 2017-08-15 NOTE — Progress Notes (Signed)
Patient upset and continue  To ask if he can eat and why he cannot eat or drink coffee. Explained why he cannot eat but refused to listen. Dr. Denton Brick was made aware. Will continue NPO.

## 2017-08-15 NOTE — Progress Notes (Signed)
Patient Demographics:    Derek Lloyd, is a 47 y.o. male, DOB - 1971/03/10, LJQ:[id number]  Admit date - 08/13/2017   Admitting Physician Vianne Bulls, MD  Outpatient Primary MD for the patient is Patient, [name]  LOS - 2   Chief Complaint  Patient presents with  . Abdominal Pain        Subjective:    Derek Lloyd today has no fevers, no emesis,  No chest pain, his history is inconsistent, he is upset that he is still n.p.o., refusing placement of NG tube as recommended by surgical service, he tells me that he is having bowel movements and passing gas, however he told other staff members that he had no bowel movement  And that he is vomiting  Assessment  & Plan :    Principal Problem:   SBO (small bowel obstruction) [facility])  Brief summary 47 year old Caucasian male admitted on 08/13/2017 with abdominal pain, nausea and emesis without blood or bile with  CT of the abdomen and pelvis showing diffuse small bowel dilation consistent with obstruction, likely transition point in the right lower quadrant where the terminal ileum appears thick walled and decompressed, possibly reflecting ileitis.  He experienced similar symptoms approximately 6 months ago, was admitted to the hospital at that time with apparent abscess in the right lower quadrant, possibly related to the appendix or terminal ileum, and was to have IR drainage but he left AMA prior to this.  Symptoms eventually resolved at home    CT A/p from 08/13/17 IMPRESSION: 1. Relatively diffuse small bowel dilatation consistent with obstruction. Transition point in the right lower quadrant where the terminal ileum is decompressed and thick-walled which may reflect ileitis. Appendix not identified. 2. New small volume ascites.  No abscess identified.  Plan:-  1)Terminal ileitis- ????? IBD/Crohn's disease--start Solu-Medrol 40 mg every 12 hours  by Dr. Paulita Fujita, continue Flagyl and Rocephin IV, as per GI service possible colonoscopy during this admission if patient is compliant  2)PSBO-  surgical consult appreciated, patient is unhappy and very fixated on being fed, rationale for n.p.o. status explained to patient by myself and RN at bedside multiple times over the last couple of days, patient refused NG tube placement, okay to use Zofran as needed . continue IV Rocephin and Flagyl for possible terminal ileitis.  No leukocytosis at this time , p.o. oxycodone or IV fentanyl as needed pain  Code Status : Full   Disposition Plan  : home  Consults  :  gen surg   DVT Prophylaxis  :   Heparin  /scd  Lab Results  Component Value Date   PLT 311 08/14/2017    Inpatient Medications  Scheduled Meds: . heparin injection (subcutaneous)  5,000 Units Subcutaneous Q8H  . methylPREDNISolone (SOLU-MEDROL) injection  40 mg Intravenous Q12H   Continuous Infusions: . cefTRIAXone (ROCEPHIN)  IV Stopped (08/14/17 2341)  . dextrose 5 % and 0.45% NaCl 125 mL/hr at 08/15/17 0500  . metronidazole 500 mg (08/15/17 1503)   PRN Meds:.acetaminophen **OR** acetaminophen, fentaNYL (SUBLIMAZE) injection, ondansetron **OR** ondansetron (ZOFRAN) IV, oxyCODONE, phenol    Anti-infectives (From admission, onward)   Start     Dose/Rate Route Frequency Ordered Stop   08/13/17 2330  cefTRIAXone (ROCEPHIN) 2  g in sodium chloride 0.9 % 100 mL IVPB     2 g 200 mL/hr over 30 Minutes Intravenous Every 24 hours 08/13/17 2318     08/13/17 2315  metroNIDAZOLE (FLAGYL) IVPB 500 mg     500 mg 100 mL/hr over 60 Minutes Intravenous Every 8 hours 08/13/17 2305          Objective:   Vitals:   08/14/17 1334 08/14/17 2235 08/15/17 0531 08/15/17 1531  BP: 111/74 102/65 101/64 117/79  Pulse: 76 72 71 71  Resp: 18 18 18 18   Temp: 97.8 F (36.6 C) 97.9 F (36.6 C) 97.8 F (36.6 C) 98.6 F (37 C)  TempSrc: Oral Oral Oral Oral  SpO2: 99% 99% 99% 100%  Weight:        Height:        Wt Readings from Last 3 Encounters:  08/13/17 63.5 kg (140 lb)  05/06/17 63.5 kg (140 lb)  01/26/17 61.2 kg (135 lb)     Intake/Output Summary (Last 24 hours) at 08/15/2017 1548 Last data filed at 08/15/2017 1500 Gross per 24 hour  Intake 1250 ml  Output -  Net 1250 ml     Physical Exam  Gen:- Awake Alert, unhappy about NPO status HEENT:- [place].AT, No sclera icterus Neck-Supple Neck,No JVD,.  Lungs-  CTAB , good air movement bilaterally CV- S1, S2 normal Abd-  +ve B.Sounds, Abd Soft, abdominal discomfort ,  right more than left, no rebound or guarding, abdominal distention persist Extremity/Skin:- No  edema,   good pulses Psych-kind of irate,  oriented x3 Neuro-no new focal deficits, no tremors   Data Review:   Micro Results No results found for this or any previous visit (from the past 240 hour(s)).  Radiology Reports Ct Abdomen Pelvis W Contrast  Result Date: 08/13/2017 CLINICAL DATA:  Generalized abdominal pain with nausea, vomiting, and flank pain for 2 days. EXAM: CT ABDOMEN AND PELVIS WITH CONTRAST TECHNIQUE: Multidetector CT imaging of the abdomen and pelvis was performed using the standard protocol following bolus administration of intravenous contrast. CONTRAST:  153m ISOVUE-300 IOPAMIDOL (ISOVUE-300) INJECTION 61% COMPARISON:  03/27/2017 FINDINGS: Lower chest: Clear lung bases. Hepatobiliary: Subcentimeter hypodensity in the lateral segment of the left hepatic lobe, too small to fully characterize. Unremarkable gallbladder. No biliary dilatation. Pancreas: Unremarkable. Spleen: Unremarkable. Adrenals/Urinary Tract: Unremarkable adrenal glands. No evidence of renal mass, calculi, or hydronephrosis. Unremarkable bladder. Stomach/Bowel: The stomach is within normal limits. There are multiple fluid-filled loops of dilated mid and distal small bowel which measure up to 3.2 cm in diameter. The terminal ileum is collapsed and may be diffusely thick walled. The  colon is nondilated, as are proximal small bowel loops. The appendix is not clearly identified, although there is some soft tissue thickening in the right lower quadrant which extends inferiorly in the pelvis towards the rectum as previously seen. No residual right lower quadrant abscess is identified. Vascular/Lymphatic: Abdominal aortic atherosclerosis without aneurysm. No enlarged lymph nodes. Reproductive: Unremarkable prostate. Other: New small volume ascites. Enlargement of mesenteric/omental vessels most notable along the transverse colon. No loculated fluid collection. Musculoskeletal: No acute osseous abnormality or suspicious osseous lesion. IMPRESSION: 1. Relatively diffuse small bowel dilatation consistent with obstruction. Transition point in the right lower quadrant where the terminal ileum is decompressed and thick-walled which may reflect ileitis. Appendix not identified. 2. New small volume ascites.  No abscess identified. Electronically Signed   By: ALogan BoresM.D.   On: 08/13/2017 19:00   Dg Abd 2 Views  Result Date: 08/14/2017 CLINICAL DATA:  Abdominal pain. EXAM: ABDOMEN - 2 VIEW COMPARISON:  CT scan of August 13, 2017. FINDINGS: Mild small bowel dilatation is noted concerning for ileus or distal small bowel obstruction. Stool is noted in the colon. There is no evidence of free air. No radio-opaque calculi or other significant radiographic abnormality is seen. IMPRESSION: Mild small bowel dilatation concerning for ileus or distal small bowel obstruction. Continued radiographic follow-up is recommended. Electronically Signed   By: Marijo Conception, M.D.   On: 08/14/2017 09:52   Dg Abd Portable 1v  Result Date: 08/15/2017 CLINICAL DATA:  Small bowel obstruction EXAM: PORTABLE ABDOMEN - 1 VIEW COMPARISON:  08/14/2017 radiographs and prior studies FINDINGS: Mildly distended gas-filled loops of small bowel are again noted. Increased gas in the colon and rectum noted. No other significant  changes are identified. IMPRESSION: Unchanged mildly distended small bowel loops which may represent small bowel obstruction as identified on recent CT. No significant change except for increased gas in the colon and rectum. Electronically Signed   By: Margarette Canada M.D.   On: 08/15/2017 08:35     CBC Recent Labs  Lab 08/13/17 1429 08/14/17 0418  WBC 8.7 8.0  HGB 17.2* 15.2  HCT 50.9 46.5  PLT 365 311  MCV 96.4 96.5  MCH 32.6 31.5  MCHC 33.8 32.7  RDW 13.3 13.2  LYMPHSABS  --  1.7  MONOABS  --  1.1*  EOSABS  --  0.2  BASOSABS  --  0.0    Chemistries  Recent Labs  Lab 08/13/17 1429 08/14/17 0418  NA 137 137  K 4.2 3.7  CL 104 106  CO2 23 21*  GLUCOSE 114* 90  BUN 11 7  CREATININE 0.87 0.81  CALCIUM 9.4 8.4*  AST 35 23  ALT 45 31  ALKPHOS 81 63  BILITOT 1.3* 1.1   ------------------------------------------------------------------------------------------------------------------ No results for input(s): CHOL, HDL, LDLCALC, TRIG, CHOLHDL, LDLDIRECT in the last 72 hours.  No results found for: HGBA1C ------------------------------------------------------------------------------------------------------------------ No results for input(s): TSH, T4TOTAL, T3FREE, THYROIDAB in the last 72 hours.  Invalid input(s): FREET3 ------------------------------------------------------------------------------------------------------------------ No results for input(s): VITAMINB12, FOLATE, FERRITIN, TIBC, IRON, RETICCTPCT in the last 72 hours.  Coagulation profile No results for input(s): INR, PROTIME in the last 168 hours.  No results for input(s): DDIMER in the last 72 hours.  Cardiac Enzymes Recent Labs  Lab 08/13/17 1754  TROPONINI <0.03   ------------------------------------------------------------------------------------------------------------------    Component Value Date/Time   BNP 14.2 08/13/2017 1754    Antoino Westhoff M.D on 08/15/2017 at 3:48 PM  Between 7am  to 7pm - Pager - [phone]  After 7pm go to [email or web address] - password [email or web address]  [facility] -  Office  [phone]   Voice Recognition Viviann Spare dictation system was used to create this note, attempts have been made to correct errors. Please contact the author with questions and/or clarifications.

## 2017-08-15 NOTE — Progress Notes (Signed)
Patient claimed he is leaving the hospital and started putting on his shoes. He also requested for doctor's note and prescription for antibiotics and pain medicine. Dr. Su Ley was notified and  He talked to patient thru phone. Patient refused to sign LAMA form. Charged RN and DON were notified. Patient requested note that he was in the hospital from 3/26 till today. Charged RN notified

## 2017-08-18 NOTE — Discharge Summary (Signed)
Derek Lloyd, is a 47 y.o. male  DOB [birth year]  MRN [id number].  Admission date:  08/13/2017  Admitting Physician  Vianne Bulls, MD  Discharge Date:  08/15/17  Primary MD  Patient, [name]  Recommendations for primary care physician for things to follow:   Patient left AMA   Admission Diagnosis  abd pain   Discharge Diagnosis  abd pain    Principal Problem:   SBO (small bowel obstruction) ([place])      Past Medical History:  Diagnosis Date  . Anxiety attack   . Headache    "frequently" (08/14/2017)  . Heart murmur    "when I was a kid" (08/14/2017)  . Small bowel obstruction ([place]) 08/13/2017    Past Surgical History:  Procedure Laterality Date  . FINGER SURGERY Left    "thumb"  . TYMPANOSTOMY TUBE PLACEMENT Bilateral        HPI  from the history and physical done on the day of admission:    Patient coming from: Home   Chief Complaint: Abd pain, N/V   HPI: Derek Lloyd is a 47 y.o. male who denies any significant past medical history, now presenting to the emergency department for evaluation of abdominal pain, nausea, and nonbloody vomiting.  Patient reports that he developed pain in the lower abdomen approximately 3 days ago with nausea and has had progression in his symptoms since that time.  He describes waxing and waning pain, mainly in the right lower quadrant with radiation across to the left lower quadrant, cramping and sharp in nature, and without alleviating or exacerbating factors.  He experienced similar symptoms approximately 6 months ago, was admitted to the hospital at that time with apparent abscess in the right lower quadrant, possibly related to the appendix or terminal ileum, and was to have IR drainage but he left AMA prior to this.  Symptoms eventually resolved at home before returning 3 days ago.  Denies fevers or chills.  Denies diarrhea.  Had a bowel movement  this morning.  ED Course: Upon arrival to the ED, patient is found to be afebrile, saturating well on room air, and with vitals otherwise normal.  EKG features normal sinus rhythm.  Chemistry panel with total bilirubin 1.3 and CBC with hemoglobin 17.2.  Troponin is undetectable.  CT of the abdomen and pelvis reveals diffuse small bowel dilation consistent with obstruction, likely transition point in the right lower quadrant where the terminal ileum appears thick walled and decompressed, possibly reflecting ileitis.  Surgery was consulted by the ED physician and recommended medical admission.  Patient was treated with morphine, Zofran, and 1 L of normal saline in the ED.  He remains hemodynamically stable, in no apparent respiratory distress, and will be admitted to the medical-surgical unit for ongoing evaluation and management of small bowel obstruction, possibly secondary to inflammatory or infectious disease in the terminal ileum.      Hospital Course:    Brief summary 47 year old Caucasian male admitted on 08/13/2017 with abdominal pain, nausea and emesis  without blood or bile with  CT of the abdomen and pelvis showing diffuse small bowel dilation consistent with obstruction,likely transition point in the right lower quadrant where the terminal ileum appears thick walled and decompressed, possibly reflecting ileitis. He experienced similar symptoms approximately 6 months ago, was admitted to the hospital at that time with apparent abscess in the right lower quadrant, possibly related to the appendix or terminal ileum, and was to have IR drainage but he left AMA prior to this. Symptoms eventually resolved at home    CT A/p from 08/13/17 IMPRESSION: 1. Relatively diffuse small bowel dilatation consistent with obstruction. Transition point in the right lower quadrant where the terminal ileum is decompressed and thick-walled which may reflect ileitis. Appendix not identified. 2. New small  volume ascites. No abscess identified.  Plan:-  1)Terminal ileitis- ????? IBD/Crohn's disease--he was started on Solu-Medrol 40 mg every 12 hours by Dr. Paulita Fujita,  he was treated with Flagyl and Rocephin IV, as per GI service possible colonoscopy during this admission if patient is compliant.. Patient refused NG tube placement, despite repeated persuasion patient left AMA on 08/15/2017   2)PSBO-  surgical consult appreciated, patient is unhappy and very fixated on being fed, rationale for n.p.o. status explained to patient by myself and RN at bedside multiple times over the last couple of days, patient refused NG tube placement, he was treated with Zofran as needed .  He was also treated with IV Rocephin and Flagyl for possible terminal ileitis.  No leukocytosis at this time , p.o. oxycodone or IV fentanyl as needed pain, Patient refused NG tube placement, despite repeated persuasion patient left AMA on 08/15/2017    Code Status : Full   Disposition Plan  :  Left AMA  Consults  :  gen surg/gastroenterology   Discharge Condition: Patient refused NG tube placement, despite repeated persuasion patient left AMA on 08/15/2017  Follow UP  Follow-up Information    Schedule an appointment as soon as possible for a visit  with Ardis Hughs, MD.   Specialty:  Urology Why:  For further evaluation and treatment of your ongoing testicle pain Contact information: [place]  [phone]           Diet and Activity recommendation:  As advised  Discharge Instructions      Discharge Medications     Allergies as of 08/15/2017      Reactions   Asa [aspirin] Rash   Childhood reaction; but tolerates well now      Medication List    You have not been prescribed any medications.     Major procedures and Radiology Reports - PLEASE review detailed and final reports for all details, in brief -   Ct Abdomen Pelvis W Contrast  Result Date:  08/13/2017 CLINICAL DATA:  Generalized abdominal pain with nausea, vomiting, and flank pain for 2 days. EXAM: CT ABDOMEN AND PELVIS WITH CONTRAST TECHNIQUE: Multidetector CT imaging of the abdomen and pelvis was performed using the standard protocol following bolus administration of intravenous contrast. CONTRAST:  170m ISOVUE-300 IOPAMIDOL (ISOVUE-300) INJECTION 61% COMPARISON:  03/27/2017 FINDINGS: Lower chest: Clear lung bases. Hepatobiliary: Subcentimeter hypodensity in the lateral segment of the left hepatic lobe, too small to fully characterize. Unremarkable gallbladder. No biliary dilatation. Pancreas: Unremarkable. Spleen: Unremarkable. Adrenals/Urinary Tract: Unremarkable adrenal glands. No evidence of renal mass, calculi, or hydronephrosis. Unremarkable bladder. Stomach/Bowel: The stomach is within normal limits. There are multiple fluid-filled loops of dilated mid and distal small  bowel which measure up to 3.2 cm in diameter. The terminal ileum is collapsed and may be diffusely thick walled. The colon is nondilated, as are proximal small bowel loops. The appendix is not clearly identified, although there is some soft tissue thickening in the right lower quadrant which extends inferiorly in the pelvis towards the rectum as previously seen. No residual right lower quadrant abscess is identified. Vascular/Lymphatic: Abdominal aortic atherosclerosis without aneurysm. No enlarged lymph nodes. Reproductive: Unremarkable prostate. Other: New small volume ascites. Enlargement of mesenteric/omental vessels most notable along the transverse colon. No loculated fluid collection. Musculoskeletal: No acute osseous abnormality or suspicious osseous lesion. IMPRESSION: 1. Relatively diffuse small bowel dilatation consistent with obstruction. Transition point in the right lower quadrant where the terminal ileum is decompressed and thick-walled which may reflect ileitis. Appendix not identified. 2. New small volume  ascites.  No abscess identified. Electronically Signed   By: Logan Bores M.D.   On: 08/13/2017 19:00   Dg Abd 2 Views  Result Date: 08/14/2017 CLINICAL DATA:  Abdominal pain. EXAM: ABDOMEN - 2 VIEW COMPARISON:  CT scan of August 13, 2017. FINDINGS: Mild small bowel dilatation is noted concerning for ileus or distal small bowel obstruction. Stool is noted in the colon. There is no evidence of free air. No radio-opaque calculi or other significant radiographic abnormality is seen. IMPRESSION: Mild small bowel dilatation concerning for ileus or distal small bowel obstruction. Continued radiographic follow-up is recommended. Electronically Signed   By: Marijo Conception, M.D.   On: 08/14/2017 09:52   Dg Abd Portable 1v  Result Date: 08/15/2017 CLINICAL DATA:  Small bowel obstruction EXAM: PORTABLE ABDOMEN - 1 VIEW COMPARISON:  08/14/2017 radiographs and prior studies FINDINGS: Mildly distended gas-filled loops of small bowel are again noted. Increased gas in the colon and rectum noted. No other significant changes are identified. IMPRESSION: Unchanged mildly distended small bowel loops which may represent small bowel obstruction as identified on recent CT. No significant change except for increased gas in the colon and rectum. Electronically Signed   By: Margarette Canada M.D.   On: 08/15/2017 08:35    Micro Results  No results found for this or any previous visit (from the past 240 hour(s)).     Today   Subjective    Derek Lloyd today.... Patient refused NG tube placement, despite repeated persuasion patient left AMA on 08/15/2017            Patient has been seen and examined prior to discharge   Objective   Blood pressure 117/79, pulse 71, temperature 98.6 F (37 C), temperature source Oral, resp. rate 18, height 5' 10"  (1.778 m), weight 63.5 kg (140 lb), SpO2 100 %.  No intake or output data in the 24 hours ending 08/18/17 1837  Exam Gen:- Awake Alert, unhappy about NPO status HEENT:-  [place].AT, No sclera icterus Neck-Supple Neck,No JVD,.  Lungs-  CTAB , good air movement bilaterally CV- S1, S2 normal Abd-  +ve B.Sounds, Abd Soft, abdominal discomfort ,  right more than left, no rebound or guarding, abdominal distention persist Extremity/Skin:- No  edema,   good pulses Psych-kind of irate,  oriented x3 Neuro-no new focal deficits, no tremors     Data Review   CBC w Diff:  Lab Results  Component Value Date   WBC 8.0 08/14/2017   HGB 15.2 08/14/2017   HCT 46.5 08/14/2017   PLT 311 08/14/2017   LYMPHOPCT 21 08/14/2017   MONOPCT 13 08/14/2017   EOSPCT 3  08/14/2017   BASOPCT 0 08/14/2017    CMP:  Lab Results  Component Value Date   NA 137 08/14/2017   K 3.7 08/14/2017   CL 106 08/14/2017   CO2 21 (L) 08/14/2017   BUN 7 08/14/2017   CREATININE 0.81 08/14/2017   PROT 5.7 (L) 08/14/2017   ALBUMIN 3.2 (L) 08/14/2017   BILITOT 1.1 08/14/2017   ALKPHOS 63 08/14/2017   AST 23 08/14/2017   ALT 31 08/14/2017  . Patient refused NG tube placement, despite repeated persuasion patient left AMA on 08/15/2017   Total Discharge time is about 33 minutes  Roxan Hockey M.D on 08/18/2017 at 6:37 PM  [facility]   Office  [phone]  Voice Recognition Viviann [employer] dictation system was used to create this note, attempts have been made to correct errors. Please contact the author with questions and/or clarifications.

## 2017-09-18 DIAGNOSIS — R109 Unspecified abdominal pain: Secondary | ICD-10-CM

## 2017-09-18 HISTORY — DX: Unspecified abdominal pain: R10.9

## 2017-09-23 ENCOUNTER — Emergency Department (HOSPITAL_COMMUNITY): Payer: BLUE CROSS/BLUE SHIELD

## 2017-09-23 ENCOUNTER — Encounter (HOSPITAL_COMMUNITY): Payer: Self-pay | Admitting: *Deleted

## 2017-09-23 ENCOUNTER — Inpatient Hospital Stay (HOSPITAL_COMMUNITY)
Admission: EM | Admit: 2017-09-23 | Discharge: 2017-10-03 | DRG: 372 | Disposition: A | Discharge status: Home / Self Care | Payer: BLUE CROSS/BLUE SHIELD | Attending: Internal Medicine | Admitting: Internal Medicine

## 2017-09-23 ENCOUNTER — Other Ambulatory Visit: Payer: Self-pay

## 2017-09-23 DIAGNOSIS — D123 Benign neoplasm of transverse colon: Secondary | ICD-10-CM | POA: Diagnosis present

## 2017-09-23 DIAGNOSIS — T360X5A Adverse effect of penicillins, initial encounter: Secondary | ICD-10-CM | POA: Diagnosis not present

## 2017-09-23 DIAGNOSIS — D120 Benign neoplasm of cecum: Secondary | ICD-10-CM | POA: Diagnosis present

## 2017-09-23 DIAGNOSIS — L271 Localized skin eruption due to drugs and medicaments taken internally: Secondary | ICD-10-CM | POA: Diagnosis not present

## 2017-09-23 DIAGNOSIS — Z880 Allergy status to penicillin: Secondary | ICD-10-CM

## 2017-09-23 DIAGNOSIS — K659 Peritonitis, unspecified: Secondary | ICD-10-CM

## 2017-09-23 DIAGNOSIS — Z5320 Procedure and treatment not carried out because of patient's decision for unspecified reasons: Secondary | ICD-10-CM | POA: Diagnosis not present

## 2017-09-23 DIAGNOSIS — R109 Unspecified abdominal pain: Secondary | ICD-10-CM | POA: Diagnosis present

## 2017-09-23 DIAGNOSIS — K621 Rectal polyp: Secondary | ICD-10-CM | POA: Diagnosis present

## 2017-09-23 DIAGNOSIS — Z87891 Personal history of nicotine dependence: Secondary | ICD-10-CM

## 2017-09-23 DIAGNOSIS — Z8042 Family history of malignant neoplasm of prostate: Secondary | ICD-10-CM

## 2017-09-23 DIAGNOSIS — K50811 Crohn's disease of both small and large intestine with rectal bleeding: Secondary | ICD-10-CM | POA: Diagnosis present

## 2017-09-23 DIAGNOSIS — R079 Chest pain, unspecified: Secondary | ICD-10-CM

## 2017-09-23 DIAGNOSIS — K652 Spontaneous bacterial peritonitis: Principal | ICD-10-CM | POA: Diagnosis present

## 2017-09-23 DIAGNOSIS — G8929 Other chronic pain: Secondary | ICD-10-CM | POA: Diagnosis present

## 2017-09-23 DIAGNOSIS — L299 Pruritus, unspecified: Secondary | ICD-10-CM | POA: Diagnosis not present

## 2017-09-23 DIAGNOSIS — K644 Residual hemorrhoidal skin tags: Secondary | ICD-10-CM | POA: Diagnosis present

## 2017-09-23 DIAGNOSIS — R188 Other ascites: Secondary | ICD-10-CM | POA: Diagnosis present

## 2017-09-23 DIAGNOSIS — N50811 Right testicular pain: Secondary | ICD-10-CM | POA: Diagnosis present

## 2017-09-23 DIAGNOSIS — K648 Other hemorrhoids: Secondary | ICD-10-CM | POA: Diagnosis present

## 2017-09-23 DIAGNOSIS — F411 Generalized anxiety disorder: Secondary | ICD-10-CM | POA: Diagnosis present

## 2017-09-23 DIAGNOSIS — D125 Benign neoplasm of sigmoid colon: Secondary | ICD-10-CM | POA: Diagnosis present

## 2017-09-23 DIAGNOSIS — D124 Benign neoplasm of descending colon: Secondary | ICD-10-CM | POA: Diagnosis present

## 2017-09-23 HISTORY — DX: Unspecified abdominal pain: R10.9

## 2017-09-23 LAB — CBC
HCT: 45.1 % (ref 39.0–52.0)
Hemoglobin: 15.6 g/dL (ref 13.0–17.0)
MCH: 33 pg (ref 26.0–34.0)
MCHC: 34.6 g/dL (ref 30.0–36.0)
MCV: 95.3 fL (ref 78.0–100.0)
Platelets: 390 10*3/uL (ref 150–400)
RBC: 4.73 MIL/uL (ref 4.22–5.81)
RDW: 12.2 % (ref 11.5–15.5)
WBC: 9.9 10*3/uL (ref 4.0–10.5)

## 2017-09-23 LAB — COMPREHENSIVE METABOLIC PANEL
ALBUMIN: 3.3 g/dL — AB (ref 3.5–5.0)
ALK PHOS: 115 U/L (ref 38–126)
ALT: 18 U/L (ref 17–63)
ANION GAP: 11 (ref 5–15)
AST: 19 U/L (ref 15–41)
BILIRUBIN TOTAL: 0.7 mg/dL (ref 0.3–1.2)
BUN: 7 mg/dL (ref 6–20)
CALCIUM: 9.1 mg/dL (ref 8.9–10.3)
CO2: 25 mmol/L (ref 22–32)
Chloride: 103 mmol/L (ref 101–111)
Creatinine, Ser: 0.79 mg/dL (ref 0.61–1.24)
GLUCOSE: 90 mg/dL (ref 65–99)
Potassium: 3.8 mmol/L (ref 3.5–5.1)
Sodium: 139 mmol/L (ref 135–145)
TOTAL PROTEIN: 7 g/dL (ref 6.5–8.1)

## 2017-09-23 LAB — I-STAT TROPONIN, ED: TROPONIN I, POC: 0 ng/mL (ref 0.00–0.08)

## 2017-09-23 LAB — LIPASE, BLOOD: Lipase: 22 U/L (ref 11–51)

## 2017-09-23 MED ORDER — IOHEXOL 300 MG/ML  SOLN
100.0000 mL | Freq: Once | INTRAMUSCULAR | Status: AC | PRN
  Administered 2017-09-24: 100 mL via INTRAVENOUS

## 2017-09-23 MED ORDER — SODIUM CHLORIDE 0.9 % IV BOLUS
500.0000 mL | Freq: Once | INTRAVENOUS | Status: AC
  Administered 2017-09-23: 500 mL via INTRAVENOUS

## 2017-09-23 MED ORDER — ONDANSETRON HCL 4 MG/2ML IJ SOLN
4.0000 mg | Freq: Once | INTRAMUSCULAR | Status: AC
  Administered 2017-09-23: 4 mg via INTRAVENOUS
  Filled 2017-09-23: qty 2

## 2017-09-23 NOTE — ED Notes (Signed)
ED Provider at bedside. 

## 2017-09-23 NOTE — ED Notes (Signed)
Patient transported to X-ray 

## 2017-09-23 NOTE — ED Notes (Signed)
Pt reporting abd pain and requesting pain medicine. EDP aware.

## 2017-09-23 NOTE — ED Notes (Signed)
Patient transported to Ultrasound 

## 2017-09-23 NOTE — ED Triage Notes (Signed)
Pt in from home via [facility], per report pt c/o abd pain onset x 2-3 mths, pt c/o N/V denies diarrhea, pt reports soft,stringy stool with IBS and Crohn's disease, pt denies CP & SOB, A&O x4

## 2017-09-24 ENCOUNTER — Encounter (HOSPITAL_COMMUNITY): Payer: Self-pay | Admitting: Internal Medicine

## 2017-09-24 ENCOUNTER — Emergency Department (HOSPITAL_COMMUNITY): Payer: BLUE CROSS/BLUE SHIELD

## 2017-09-24 ENCOUNTER — Inpatient Hospital Stay (HOSPITAL_COMMUNITY): Payer: BLUE CROSS/BLUE SHIELD

## 2017-09-24 ENCOUNTER — Other Ambulatory Visit: Payer: Self-pay

## 2017-09-24 DIAGNOSIS — K652 Spontaneous bacterial peritonitis: Secondary | ICD-10-CM | POA: Diagnosis not present

## 2017-09-24 DIAGNOSIS — Z880 Allergy status to penicillin: Secondary | ICD-10-CM | POA: Diagnosis not present

## 2017-09-24 DIAGNOSIS — R188 Other ascites: Secondary | ICD-10-CM | POA: Diagnosis present

## 2017-09-24 DIAGNOSIS — K621 Rectal polyp: Secondary | ICD-10-CM | POA: Diagnosis present

## 2017-09-24 DIAGNOSIS — D125 Benign neoplasm of sigmoid colon: Secondary | ICD-10-CM | POA: Diagnosis present

## 2017-09-24 DIAGNOSIS — D120 Benign neoplasm of cecum: Secondary | ICD-10-CM | POA: Diagnosis present

## 2017-09-24 DIAGNOSIS — K639 Disease of intestine, unspecified: Secondary | ICD-10-CM | POA: Diagnosis not present

## 2017-09-24 DIAGNOSIS — R1032 Left lower quadrant pain: Secondary | ICD-10-CM | POA: Diagnosis not present

## 2017-09-24 DIAGNOSIS — N50811 Right testicular pain: Secondary | ICD-10-CM | POA: Diagnosis not present

## 2017-09-24 DIAGNOSIS — K648 Other hemorrhoids: Secondary | ICD-10-CM | POA: Diagnosis present

## 2017-09-24 DIAGNOSIS — K50811 Crohn's disease of both small and large intestine with rectal bleeding: Secondary | ICD-10-CM | POA: Diagnosis present

## 2017-09-24 DIAGNOSIS — Z881 Allergy status to other antibiotic agents status: Secondary | ICD-10-CM | POA: Diagnosis not present

## 2017-09-24 DIAGNOSIS — R1084 Generalized abdominal pain: Secondary | ICD-10-CM

## 2017-09-24 DIAGNOSIS — R180 Malignant ascites: Secondary | ICD-10-CM | POA: Diagnosis not present

## 2017-09-24 DIAGNOSIS — T360X5A Adverse effect of penicillins, initial encounter: Secondary | ICD-10-CM | POA: Diagnosis not present

## 2017-09-24 DIAGNOSIS — D124 Benign neoplasm of descending colon: Secondary | ICD-10-CM | POA: Diagnosis present

## 2017-09-24 DIAGNOSIS — L271 Localized skin eruption due to drugs and medicaments taken internally: Secondary | ICD-10-CM | POA: Diagnosis not present

## 2017-09-24 DIAGNOSIS — F411 Generalized anxiety disorder: Secondary | ICD-10-CM | POA: Diagnosis present

## 2017-09-24 DIAGNOSIS — K644 Residual hemorrhoidal skin tags: Secondary | ICD-10-CM | POA: Diagnosis present

## 2017-09-24 DIAGNOSIS — K589 Irritable bowel syndrome without diarrhea: Secondary | ICD-10-CM | POA: Diagnosis not present

## 2017-09-24 DIAGNOSIS — L270 Generalized skin eruption due to drugs and medicaments taken internally: Secondary | ICD-10-CM | POA: Diagnosis not present

## 2017-09-24 DIAGNOSIS — Z5320 Procedure and treatment not carried out because of patient's decision for unspecified reasons: Secondary | ICD-10-CM | POA: Diagnosis not present

## 2017-09-24 DIAGNOSIS — K668 Other specified disorders of peritoneum: Secondary | ICD-10-CM | POA: Diagnosis not present

## 2017-09-24 DIAGNOSIS — Z87891 Personal history of nicotine dependence: Secondary | ICD-10-CM | POA: Diagnosis not present

## 2017-09-24 DIAGNOSIS — Z8719 Personal history of other diseases of the digestive system: Secondary | ICD-10-CM | POA: Diagnosis not present

## 2017-09-24 DIAGNOSIS — L299 Pruritus, unspecified: Secondary | ICD-10-CM | POA: Diagnosis not present

## 2017-09-24 DIAGNOSIS — G8929 Other chronic pain: Secondary | ICD-10-CM | POA: Diagnosis present

## 2017-09-24 DIAGNOSIS — R109 Unspecified abdominal pain: Secondary | ICD-10-CM | POA: Diagnosis present

## 2017-09-24 DIAGNOSIS — Z886 Allergy status to analgesic agent status: Secondary | ICD-10-CM | POA: Diagnosis not present

## 2017-09-24 DIAGNOSIS — D123 Benign neoplasm of transverse colon: Secondary | ICD-10-CM | POA: Diagnosis present

## 2017-09-24 DIAGNOSIS — R1030 Lower abdominal pain, unspecified: Secondary | ICD-10-CM | POA: Diagnosis not present

## 2017-09-24 DIAGNOSIS — Z8042 Family history of malignant neoplasm of prostate: Secondary | ICD-10-CM | POA: Diagnosis not present

## 2017-09-24 HISTORY — PX: IR PARACENTESIS: IMG2679

## 2017-09-24 LAB — BASIC METABOLIC PANEL
Anion gap: 10 (ref 5–15)
BUN: 9 mg/dL (ref 6–20)
CHLORIDE: 104 mmol/L (ref 101–111)
CO2: 25 mmol/L (ref 22–32)
Calcium: 8.7 mg/dL — ABNORMAL LOW (ref 8.9–10.3)
Creatinine, Ser: 0.87 mg/dL (ref 0.61–1.24)
GFR calc Af Amer: >60 mL/min (ref >60)
Glucose, Bld: 82 mg/dL (ref 65–99)
POTASSIUM: 3.7 mmol/L (ref 3.5–5.1)
SODIUM: 139 mmol/L (ref 135–145)

## 2017-09-24 LAB — CBC WITH DIFFERENTIAL/PLATELET
BASOS ABS: 0 10*3/uL (ref 0.0–0.1)
Basophils Relative: 0 %
Eosinophils Absolute: 0.1 10*3/uL (ref 0.0–0.7)
Eosinophils Relative: 1 %
HEMATOCRIT: 42.8 % (ref 39.0–52.0)
Hemoglobin: 14.4 g/dL (ref 13.0–17.0)
Lymphocytes Relative: 21 %
Lymphs Abs: 1.9 10*3/uL (ref 0.7–4.0)
MCH: 32.4 pg (ref 26.0–34.0)
MCHC: 33.6 g/dL (ref 30.0–36.0)
MCV: 96.4 fL (ref 78.0–100.0)
MONO ABS: 0.9 10*3/uL (ref 0.1–1.0)
Monocytes Relative: 10 %
NEUTROS ABS: 6.2 10*3/uL (ref 1.7–7.7)
Neutrophils Relative %: 68 %
Platelets: 370 10*3/uL (ref 150–400)
RBC: 4.44 MIL/uL (ref 4.22–5.81)
RDW: 12.6 % (ref 11.5–15.5)
WBC: 9.2 10*3/uL (ref 4.0–10.5)

## 2017-09-24 LAB — BODY FLUID CELL COUNT WITH DIFFERENTIAL
Lymphs, Fluid: 8 %
Monocyte-Macrophage-Serous Fluid: 61 % (ref 50–90)
Neutrophil Count, Fluid: 31 % — ABNORMAL HIGH (ref 0–25)
WBC FLUID: 6050 uL — AB (ref 0–1000)

## 2017-09-24 LAB — HEPATIC FUNCTION PANEL
ALK PHOS: 107 U/L (ref 38–126)
ALT: 14 U/L — AB (ref 17–63)
AST: 15 U/L (ref 15–41)
Albumin: 3 g/dL — ABNORMAL LOW (ref 3.5–5.0)
BILIRUBIN INDIRECT: 1.1 mg/dL — AB (ref 0.3–0.9)
Bilirubin, Direct: 0.1 mg/dL (ref 0.1–0.5)
Total Bilirubin: 1.2 mg/dL (ref 0.3–1.2)
Total Protein: 6.1 g/dL — ABNORMAL LOW (ref 6.5–8.1)

## 2017-09-24 LAB — URINALYSIS, ROUTINE W REFLEX MICROSCOPIC
BILIRUBIN URINE: NEGATIVE
Glucose, UA: NEGATIVE mg/dL
Hgb urine dipstick: NEGATIVE
KETONES UR: 20 mg/dL — AB
LEUKOCYTES UA: NEGATIVE
NITRITE: NEGATIVE
PH: 5 (ref 5.0–8.0)
PROTEIN: NEGATIVE mg/dL
Specific Gravity, Urine: >1.046 — ABNORMAL HIGH (ref 1.005–1.030)

## 2017-09-24 LAB — PROTEIN, PLEURAL OR PERITONEAL FLUID: Total protein, fluid: 4.4 g/dL

## 2017-09-24 LAB — GLUCOSE, PLEURAL OR PERITONEAL FLUID: GLUCOSE FL: 76 mg/dL

## 2017-09-24 LAB — LACTIC ACID, PLASMA
Lactic Acid, Venous: 0.6 mmol/L (ref 0.5–1.9)
Lactic Acid, Venous: 0.7 mmol/L (ref 0.5–1.9)

## 2017-09-24 LAB — GLUCOSE, CAPILLARY
GLUCOSE-CAPILLARY: 77 mg/dL (ref 65–99)
Glucose-Capillary: 88 mg/dL (ref 65–99)

## 2017-09-24 LAB — ALBUMIN, PLEURAL OR PERITONEAL FLUID: ALBUMIN FL: 2.6 g/dL

## 2017-09-24 MED ORDER — HYDROMORPHONE HCL 2 MG/ML IJ SOLN
1.0000 mg | INTRAMUSCULAR | Status: DC | PRN
Start: 2017-09-24 — End: 2017-09-24

## 2017-09-24 MED ORDER — MORPHINE SULFATE (PF) 4 MG/ML IV SOLN
4.0000 mg | Freq: Once | INTRAVENOUS | Status: AC
  Administered 2017-09-24: 4 mg via INTRAVENOUS
  Filled 2017-09-24: qty 1

## 2017-09-24 MED ORDER — ACETAMINOPHEN 650 MG RE SUPP: 650.0000 mg | Freq: Four times a day (QID) | RECTAL | Status: DC | PRN

## 2017-09-24 MED ORDER — ONDANSETRON HCL 4 MG/2ML IJ SOLN
4.0000 mg | Freq: Four times a day (QID) | INTRAMUSCULAR | Status: DC | PRN
  Administered 2017-09-26 – 2017-10-01 (×9): 4 mg via INTRAVENOUS
  Filled 2017-09-24 (×11): qty 2

## 2017-09-24 MED ORDER — HYDROMORPHONE HCL 1 MG/ML IJ SOLN
1.0000 mg | INTRAMUSCULAR | Status: DC | PRN
  Administered 2017-09-24: 1 mg via INTRAVENOUS
  Filled 2017-09-24: qty 1

## 2017-09-24 MED ORDER — LIDOCAINE HCL (PF) 2 % IJ SOLN
INTRAMUSCULAR | Status: DC | PRN
  Administered 2017-09-24: 10 mL

## 2017-09-24 MED ORDER — PIPERACILLIN-TAZOBACTAM 3.375 G IVPB
3.3750 g | Freq: Three times a day (TID) | INTRAVENOUS | Status: DC
  Administered 2017-09-24 – 2017-09-25 (×3): 3.375 g via INTRAVENOUS
  Filled 2017-09-24 (×4): qty 50

## 2017-09-24 MED ORDER — LIDOCAINE HCL (PF) 2 % IJ SOLN
INTRAMUSCULAR | Status: AC
  Filled 2017-09-24: qty 20

## 2017-09-24 MED ORDER — HYDROMORPHONE HCL 2 MG/ML IJ SOLN
1.0000 mg | Freq: Once | INTRAMUSCULAR | Status: AC
  Administered 2017-09-24: 1 mg via INTRAVENOUS
  Filled 2017-09-24: qty 1

## 2017-09-24 MED ORDER — ONDANSETRON HCL 4 MG PO TABS: 4.0000 mg | ORAL_TABLET | Freq: Four times a day (QID) | ORAL | Status: DC | PRN

## 2017-09-24 MED ORDER — ACETAMINOPHEN 325 MG PO TABS: 650.0000 mg | ORAL_TABLET | Freq: Four times a day (QID) | ORAL | Status: DC | PRN

## 2017-09-24 MED ORDER — HYDROMORPHONE HCL 1 MG/ML IJ SOLN
1.0000 mg | INTRAMUSCULAR | Status: DC | PRN
  Administered 2017-09-24 – 2017-09-27 (×18): 1 mg via INTRAVENOUS
  Filled 2017-09-24 (×21): qty 1

## 2017-09-24 MED ORDER — ONDANSETRON HCL 4 MG/2ML IJ SOLN
4.0000 mg | Freq: Four times a day (QID) | INTRAMUSCULAR | Status: DC | PRN
  Administered 2017-09-24: 4 mg via INTRAVENOUS
  Filled 2017-09-24: qty 2

## 2017-09-24 MED ORDER — SODIUM CHLORIDE 0.9 % IV SOLN
2.0000 g | INTRAVENOUS | Status: DC
  Administered 2017-09-24: 2 g via INTRAVENOUS
  Filled 2017-09-24: qty 20

## 2017-09-24 NOTE — Progress Notes (Signed)
Received report on pt.

## 2017-09-24 NOTE — Progress Notes (Signed)
Pharmacy Antibiotic Note  Jayleen Afonso is a 47 y.o. male admitted on 09/23/2017 with SBP.  Pharmacy has been consulted for ceftriaxone dosing. WBC 9.2  Plan: -Ceftriaxone 2 gm IV Q 24 hours -Pharmacy to sign off since no further dosage adjustments necessary     Temp (24hrs), Avg:98.4 F (36.9 C), Min:98 F (36.7 C), Max:98.8 F (37.1 C)  Recent Labs  Lab 09/23/17 1236 09/24/17 0417  WBC 9.9 9.2  CREATININE 0.79 0.87    CrCl cannot be calculated (Unknown ideal weight.).    Allergies  Allergen Reactions  . Asa [Aspirin] Rash    Childhood reaction; but tolerates well now     Thank you for allowing pharmacy to be a part of this patient's care.  Albertina Parr, PharmD., BCPS Clinical Pharmacist Clinical phone for 09/24/17 until 3:30pm: [phone] If after 3:30pm, please call main pharmacy at: [phone]

## 2017-09-24 NOTE — Consult Note (Addendum)
Referring Provider: [facility] Primary Care Physician:  Patient, [name] Primary Gastroenterologist:  unassigned  Reason for Consultation:  Possible Crohn's disease  HPI: Walton Digilio is a 47 y.o. male admitted to the hospital for evaluation of abdominal pain. Patient was admitted to the hospital in March 2019 for abdominal pain of 2 months duration.CT scan at that time showed small bowel obstruction with possible thickening of the terminal ileum. Patient subsequently left AMA.  Patient seen and examined bedside. Complaining of worsening lower quadrant abdominal pain along with intermittent nausea and vomiting. Denies any blood in the stool or black stool. Complaining of decreased appetite and some weight loss. Uses ibuprofen on a daily basis. complaining of generalized weakness but denies any chest pain and shortness of breath.  Underwent paracentesis with 1.5 L of fluid removed. currently on Rocephin.  Past Medical History:  Diagnosis Date  . Abdominal pain 09/2017  . Anxiety attack   . Headache    "frequently" (08/14/2017)  . Heart murmur    "when I was a kid" (08/14/2017)  . Small bowel obstruction ([place]) 08/13/2017    Past Surgical History:  Procedure Laterality Date  . FINGER SURGERY Left    "thumb"  . IR PARACENTESIS  09/24/2017  . TYMPANOSTOMY TUBE PLACEMENT Bilateral     Prior to Admission medications   Medication Sig Start Date End Date Taking? Authorizing Provider  Garlic 2751 MG CAPS Take 1,000 mg by mouth daily.   Yes [provider]  ibuprofen (ADVIL,MOTRIN) 200 MG tablet Take 600 mg by mouth every 6 (six) hours as needed.   Yes [provider]  OVER THE COUNTER MEDICATION Take 1 tablet by mouth daily. [place]   Yes [provider]  vitamin C (ASCORBIC ACID) 500 MG tablet Take 1,000 mg by mouth daily.   Yes [provider]    Scheduled Meds: . lidocaine       Continuous Infusions: . cefTRIAXone (ROCEPHIN)  IV Stopped (09/24/17 1050)    PRN Meds:.acetaminophen **OR** acetaminophen, HYDROmorphone (DILAUDID) injection, lidocaine, ondansetron **OR** ondansetron (ZOFRAN) IV  Allergies as of 09/23/2017 - Review Complete 09/23/2017  Allergen Reaction Noted  . Asa [aspirin] Rash 11/13/2014    Family History  Problem Relation Age of Onset  . Prostate cancer Paternal Grandfather   . Crohn's disease Neg Hx     Social History   Socioeconomic History  . Marital status: Single    Spouse name: Not on file  . Number of children: Not on file  . Years of education: Not on file  . Highest education level: Not on file  Occupational History  . Not on file  Social Needs  . Financial resource strain: Not on file  . Food insecurity:    Worry: Not on file    Inability: Not on file  . Transportation needs:    Medical: Not on file    Non-medical: Not on file  Tobacco Use  . Smoking status: Former Smoker    Packs/day: 1.00    Types: Cigarettes    Last attempt to quit: 09/28/2016    Years since quitting: 0.9  . Smokeless tobacco: Never Used  . Tobacco comment: 08/14/2017 "I quit; can't tell you when I quit or when I started"  Substance and Sexual Activity  . Alcohol use: Yes    Comment: 08/14/2017 "I drink when I feel like it; nothing recently"  . Drug use: No  . Sexual activity: Not on file  Lifestyle  . Physical activity:  Days per week: Not on file    Minutes per session: Not on file  . Stress: Not on file  Relationships  . Social connections:    Talks on phone: Not on file    Gets together: Not on file    Attends religious service: Not on file    Active member of club or organization: Not on file    Attends meetings of clubs or organizations: Not on file    Relationship status: Not on file  . Intimate partner violence:    Fear of current or ex partner: Not on file    Emotionally abused: Not on file    Physically abused: Not on file    Forced sexual activity: Not on file  Other Topics Concern  . Not on file   Social History Narrative  . Not on file    Review of Systems: Review of Systems  Constitutional: Positive for malaise/fatigue. Negative for chills and fever.  HENT: Negative for hearing loss and tinnitus.   Eyes: Negative for blurred vision and double vision.  Respiratory: Negative for cough and hemoptysis.   Cardiovascular: Negative for chest pain and palpitations.  Gastrointestinal: Positive for abdominal pain, diarrhea, heartburn, nausea and vomiting. Negative for blood in stool, constipation and melena.  Genitourinary: Negative for dysuria and urgency.  Musculoskeletal: Positive for back pain, joint pain and myalgias.  Skin: Negative for itching and rash.  Neurological: Negative for seizures and loss of consciousness.  Endo/Heme/Allergies: Does not bruise/bleed easily.  Psychiatric/Behavioral: Negative for hallucinations and suicidal ideas.    Physical Exam: Vital signs: Vitals:   09/24/17 0924 09/24/17 0955  BP: 121/78 121/89  Pulse:    Resp:    Temp:    SpO2:     Last BM Date: 09/23/17 Physical Exam  Constitutional: He is oriented to person, place, and time. He appears well-developed and well-nourished. No distress.  HENT:  Head: Normocephalic and atraumatic.  Mouth/Throat: Oropharynx is clear and moist. No oropharyngeal exudate.  Eyes: EOM are normal. No scleral icterus.  Neck: Normal range of motion. Neck supple.  Cardiovascular: Normal rate, regular rhythm and normal heart sounds.  Pulmonary/Chest: Effort normal and breath sounds normal.  Abdominal: Bowel sounds are normal. He exhibits distension. There is tenderness. There is no rebound and no guarding.  generalized abdominal distention with generalized tenderness to palpation.  Musculoskeletal: Normal range of motion. He exhibits no edema.  Neurological: He is alert and oriented to person, place, and time.  Skin: Skin is warm. No erythema.  Psychiatric: He has a normal mood and affect. Judgment normal.   Vitals reviewed.  GI:  Lab Results: Recent Labs    09/23/17 1236 09/24/17 0417  WBC 9.9 9.2  HGB 15.6 14.4  HCT 45.1 42.8  PLT 390 370   BMET Recent Labs    09/23/17 1236 09/24/17 0417  NA 139 139  K 3.8 3.7  CL 103 104  CO2 25 25  GLUCOSE 90 82  BUN 7 9  CREATININE 0.79 0.87  CALCIUM 9.1 8.7*   LFT Recent Labs    09/24/17 0417  PROT 6.1*  ALBUMIN 3.0*  AST 15  ALT 14*  ALKPHOS 107  BILITOT 1.2  BILIDIR 0.1  IBILI 1.1*   PT/INR No results for input(s): LABPROT, INR in the last 72 hours.   Studies/Results: Ct Abdomen Pelvis W Contrast  Result Date: 09/24/2017 CLINICAL DATA:  Abdominal pain onset 2-3 months with nausea, vomiting and soft stringy stool. EXAM: CT ABDOMEN AND  PELVIS WITH CONTRAST TECHNIQUE: Multidetector CT imaging of the abdomen and pelvis was performed using the standard protocol following bolus administration of intravenous contrast. CONTRAST:  136m OMNIPAQUE IOHEXOL 300 MG/ML  SOLN COMPARISON:  None. FINDINGS: Lower chest: Normal size heart without pericardial effusion. Bibasilar atelectasis. Hepatobiliary: No space-occupying mass of the liver. Mild surface nodularity suggested that may reflect morphologic changes of cirrhosis. Gallbladder is physiologically distended without calculi. Pancreas: Normal Spleen: No splenomegaly. Adrenals/Urinary Tract: Adrenal glands are unremarkable. Kidneys are normal, without renal calculi, focal lesion, or hydronephrosis. Bladder is unremarkable. Stomach/Bowel: Centralized small bowel loops secondary to interval increase in moderate volume of ascites. Fluid-filled distention and thickening of small bowel measuring up to 3 cm in diameter is redemonstrated without transition point clearly identified. Some of this appearance may be sympathetic due to adjacent ascites. Distal ileal loops appears somewhat thickened, series 3/55 and may reflect stigmata of inflammatory bowel disease. Unremarkable colon. The appendix is  unremarkable seen adjacent to the right iliac crest. Stomach is somewhat decompressed in appearance which may account for some of its transmural thickening. No discrete mass of the stomach identified. Vascular/Lymphatic: Aortic atherosclerosis. No enlarged abdominal or pelvic lymph nodes. Reproductive: Prostate is unremarkable. Other: As stated, interval increase in ascites.  No free air. Musculoskeletal: No acute or significant osseous findings. IMPRESSION: 1. Interval increase in moderate volume of ascites with centralized small bowel loops demonstrating fluid-filled distention up to 3 cm. Relative decompressed appearance of small bowel distally though there is suggestion of mild thickening in the region of the distal ileum. Changes of inflammatory bowel might account for this appearance. 2. Ascites may be attributable to cirrhotic change of the liver given subtle surface nodularity. No splenomegaly is identified. Electronically Signed   By: DAshley RoyaltyM.D.   On: 09/24/2017 02:01   Dg Abdomen Acute W/chest  Result Date: 09/23/2017 CLINICAL DATA:  47y/o  M; 2-3 months of abdominal pain. EXAM: DG ABDOMEN ACUTE W/ 1V CHEST COMPARISON:  08/15/2017 abdomen radiographs. FINDINGS: There is no evidence of dilated bowel loops or free intraperitoneal air. No radiopaque calculi or other significant radiographic abnormality is seen. Heart size and mediastinal contours are within normal limits. Both lungs are clear. IMPRESSION: Negative abdominal radiographs.  No acute cardiopulmonary disease. Electronically Signed   By: LKristine GarbeM.D.   On: 09/23/2017 22:50   UKoreaScrotum W/doppler  Result Date: 09/24/2017 CLINICAL DATA:  47year old male with right testicular pain and tenderness. EXAM: SCROTAL ULTRASOUND DOPPLER ULTRASOUND OF THE TESTICLES TECHNIQUE: Complete ultrasound examination of the testicles, epididymis, and other scrotal structures was performed. Color and spectral Doppler ultrasound were also  utilized to evaluate blood flow to the testicles. COMPARISON:  None. FINDINGS: Right testicle Measurements: 4.7 x 2.0 x 2.6 cm. No mass or microlithiasis visualized. Left testicle Measurements: 4.3 x 2.2 x 2.7 cm. No mass or microlithiasis visualized. Right epididymis:  Normal in size and appearance. Left epididymis:  Normal in size and appearance. Hydrocele:  None visualized. Varicocele:  None visualized. Pulsed Doppler interrogation of both testes demonstrates normal low resistance arterial and venous waveforms bilaterally. IMPRESSION: Unremarkable testicular ultrasound. Doppler detected arterial and venous flow to both testicles. Electronically Signed   By: AAnner CreteM.D.   On: 09/24/2017 00:26   Ir Paracentesis  Result Date: 09/24/2017 INDICATION: Patient with history of abdominal pain, ascites, cirrhosis by imaging. Request made for diagnostic and therapeutic paracentesis. EXAM: ULTRASOUND GUIDED DIAGNOSTIC AND THERAPEUTIC PARACENTESIS MEDICATIONS: None COMPLICATIONS: None immediate. PROCEDURE: Informed written consent  was obtained from the patient after a discussion of the risks, benefits and alternatives to treatment. A timeout was performed prior to the initiation of the procedure. Initial ultrasound scanning demonstrates a small amount of ascites within the right lower abdominal quadrant. The right lower abdomen was prepped and draped in the usual sterile fashion. 2% lidocaine was used for local anesthesia. Following this, a 19 gauge, 7-cm, Yueh catheter was introduced. An ultrasound image was saved for documentation purposes. The paracentesis was performed. The catheter was removed and a dressing was applied. The patient tolerated the procedure well without immediate post procedural complication. FINDINGS: A total of approximately 1.5 liters of hazy, amber fluid was removed. Samples were sent to the laboratory as requested by the clinical team. IMPRESSION: Successful ultrasound-guided diagnostic  and therapeutic paracentesis yielding 1.5 liters of peritoneal fluid. Read by: Rowe Robert, PA-C Electronically Signed   By: Jerilynn Mages.  Shick M.D.   On: 09/24/2017 10:33    Impression/Plan: - abdominal pain with CT scan showing thickening of the distal  Ileum.patient was admitted with similar symptoms in March 2019 and CT scan at that time showed small bowel obstruction with possible thickening of the terminal ileum Patient left AMA  - ascites. Fluid analysis positive for increased white counts in the ascites fluid. ( WBCs 6050 with 31 % PMNs) CT scan finding concerning for cirrhosis. Patient has normal LFTs. Normal platelet counts. Ascites total protein count 4.4. Albumin 2.6. SAAG 0.4 . Finding concerning for secondary bacterial peritonitis.   Recommendations ----------------------- - .Patient with Ascites total protein count 4.4. Acetic fluid Albumin 2.6. SAAG 0.4 .ascites may not be related to portal hypertension.Most likely secondary bacterial peritonitis. Change antibiotics to Zosyn. - recommend colonoscopy once acute issues are resolved. - Repeat CBC and CMP in the morning. PT/INR and hepatitis panel. - GI will follow    LOS: 0 days   Otis Brace  MD, FACP 09/24/2017, 12:54 PM  Contact #  [phone]

## 2017-09-24 NOTE — Procedures (Signed)
Ultrasound-guided diagnostic and therapeutic paracentesis performed yielding 1.5  liters of hazy, amber fluid. No immediate complications. The fluid was sent to the lab for preordered studies.

## 2017-09-24 NOTE — Progress Notes (Signed)
Derek Lloyd is a 47 y.o. male patient admitted from ED awake, alert - oriented  X 4 - no acute distress noted.  VSS - Blood pressure 115/74, pulse 87, temperature 98.8 F (37.1 C), temperature source Oral, resp. rate 18, SpO2 97 %.    IV in place, occlusive dsg intact without redness.  Orientation to room, and floor completed with information packet given to patient/family.  Patient declined safety video at this time.  Admission INP armband ID verified with patient/family, and in place.   SR up x 2, fall assessment complete, with patient and family able to verbalize understanding of risk associated with falls, and verbalized understanding to call nsg before up out of bed.  Call light within reach, patient able to voice, and demonstrate understanding.  Skin, clean-dry- intact without evidence of bruising, or skin tears.   No evidence of skin break down noted on exam.     Will cont to eval and treat per MD orders.  Celine Ahr, RN 09/24/2017 8:27 AM

## 2017-09-24 NOTE — ED Provider Notes (Signed)
[facility] EMERGENCY DEPARTMENT Provider Note   CSN: [id number] Arrival date & time: 09/23/17  1232     History   Chief Complaint Chief Complaint  Patient presents with  . Abdominal Pain    HPI Derek Lloyd is a 47 y.o. male.  HPI   Patient is a 47 year old male with a history of small bowel obstruction and RLQ intraabdominal abscess who presents the ED today complaining of right lower quadrant abdominal pain that has been ongoing for about 3 months but has seemed to worsen over the last 3 to 4 days.  It initially started intermittently however now has become constant.  States pain is 10/10 in severity.  He is also complaining of epigastric abdominal pain, bilateral flank pain for the same time period.  He also reports nausea but denies any vomiting.  States he has had soft, "stringy" stool that is "yellowish."  States that he is still passing gas.  Denies constipation.  Denies any fevers or shortness of breath.  States that sometimes his abdominal pain does radiate up to his lower chest just below the xiphoid process. No chest pain currently and no SOB. He is also complaining of pain to his right testicle and perineal area. denies redness or swelling in that area.  He states that he has had this pain for years however it has seemed to worsen over the same time period that his abdominal pain has worsened.  He also reports dysuria and some urine Neri hesitancy.  Denies hematuria or urgency.  Denies penile discharge.  Denies any recent unprotected sex or concern for STD.  According to prior notes patient was admitted in March 2019 for small bowel obstruction and suspected ileitis.  Also had small volume ascites. At that time pt had possible suspected IBD/crohn's disease and was started on solumedrol q12. He was also treated with cetriaxone and rocephin. During admission pt refused certain treatments including placement of ng tube. He subsequently left AMA.  Past Medical  History:  Diagnosis Date  . Anxiety attack   . Headache    "frequently" (08/14/2017)  . Heart murmur    "when I was a kid" (08/14/2017)  . Small bowel obstruction ([place]) 08/13/2017    Patient Active Problem List   Diagnosis Date Noted  . Abdominal pain 09/24/2017  . SBO (small bowel obstruction) ([place]) 08/13/2017  . Right lower quadrant abdominal abscess ([place]) 01/26/2017    Past Surgical History:  Procedure Laterality Date  . FINGER SURGERY Left    "thumb"  . TYMPANOSTOMY TUBE PLACEMENT Bilateral         Home Medications    Prior to Admission medications   Medication Sig Start Date End Date Taking? Authorizing Provider  Garlic 9678 MG CAPS Take 1,000 mg by mouth daily.   Yes [provider]  ibuprofen (ADVIL,MOTRIN) 200 MG tablet Take 600 mg by mouth every 6 (six) hours as needed.   Yes [provider]  OVER THE COUNTER MEDICATION Take 1 tablet by mouth daily. Gensing   Yes [provider]  vitamin C (ASCORBIC ACID) 500 MG tablet Take 1,000 mg by mouth daily.   Yes [provider]    Family History No family history on file.  Social History Social History   Tobacco Use  . Smoking status: Former Smoker    Packs/day: 1.00    Types: Cigarettes    Last attempt to quit: 09/28/2016    Years since quitting: 0.9  . Smokeless tobacco: Never  Used  . Tobacco comment: 08/14/2017 "I quit; can't tell you when I quit or when I started"  Substance Use Topics  . Alcohol use: Yes    Comment: 08/14/2017 "I drink when I feel like it; nothing recently"  . Drug use: No     Allergies   Asa [aspirin]   Review of Systems Review of Systems  Constitutional: Negative for fever.  HENT: Negative for ear pain and sore throat.   Eyes: Negative for visual disturbance.  Respiratory: Negative for cough and shortness of breath.   Cardiovascular: Negative for chest pain and palpitations.  Gastrointestinal: Positive for abdominal pain and nausea. Negative  for blood in stool, constipation, diarrhea and vomiting.  Genitourinary: Positive for dysuria, flank pain and testicular pain. Negative for discharge, hematuria, penile pain, penile swelling, scrotal swelling and urgency.       Hesitancy  Musculoskeletal: Negative for back pain.  Skin: Negative for rash.  Neurological: Negative for headaches.  All other systems reviewed and are negative.    Physical Exam Updated Vital Signs BP 113/69 (BP Location: Left Arm)   Pulse 95   Temp 98.8 F (37.1 C) (Oral)   Resp 16   SpO2 98%   Physical Exam  Constitutional: He appears well-developed and well-nourished.  Non-toxic appearance. He does not appear ill.  HENT:  Head: Normocephalic and atraumatic.  Mouth/Throat: Oropharynx is clear and moist.  Eyes: Conjunctivae are normal. No scleral icterus.  Neck: Neck supple.  Cardiovascular: Normal rate, regular rhythm and normal heart sounds.  No murmur heard. Pulmonary/Chest: Effort normal and breath sounds normal. No stridor. No respiratory distress. He has no wheezes.  Abdominal: Soft. Bowel sounds are normal.  ttp to RLQ, right suprapubic area, and epigastric abdomen with guarding. No rigidity. Active bowel sounds. bilat CVA ttp. No hernia palpated  Genitourinary: Penis normal. Cremasteric reflex is present. Right testis shows tenderness. Right testis shows no mass and no swelling. Left testis shows no mass, no swelling and no tenderness.  Genitourinary Comments: Chaperone present during examination. ttp to perineal area without fluctuance, induration, or erythema  Musculoskeletal: He exhibits no edema.  Neurological: He is alert.  Skin: Skin is warm and dry.  Psychiatric: He has a normal mood and affect.  Nursing note and vitals reviewed.   ED Treatments / Results  Labs (all labs ordered are listed, but only abnormal results are displayed) Labs Reviewed  COMPREHENSIVE METABOLIC PANEL - Abnormal; Notable for the following components:       Result Value   Albumin 3.3 (*)    All other components within normal limits  URINALYSIS, ROUTINE W REFLEX MICROSCOPIC - Abnormal; Notable for the following components:   Specific Gravity, Urine >1.046 (*)    Ketones, ur 20 (*)    All other components within normal limits  URINE CULTURE  LIPASE, BLOOD  CBC  I-STAT TROPONIN, ED    EKG EKG Interpretation  Date/Time:  Monday Sep 23 2017 22:32:37 EDT Ventricular Rate:  87 PR Interval:    QRS Duration: 85 QT Interval:  345 QTC Calculation: 415 R Axis:   67 Text Interpretation:  Sinus rhythm Consider right atrial enlargement Confirmed by Isla Pence [phone]) on 09/23/2017 10:34:51 PM   Radiology Ct Abdomen Pelvis W Contrast  Result Date: 09/24/2017 CLINICAL DATA:  Abdominal pain onset 2-3 months with nausea, vomiting and soft stringy stool. EXAM: CT ABDOMEN AND PELVIS WITH CONTRAST TECHNIQUE: Multidetector CT imaging of the abdomen and pelvis was performed using the standard protocol following  bolus administration of intravenous contrast. CONTRAST:  184m OMNIPAQUE IOHEXOL 300 MG/ML  SOLN COMPARISON:  None. FINDINGS: Lower chest: Normal size heart without pericardial effusion. Bibasilar atelectasis. Hepatobiliary: No space-occupying mass of the liver. Mild surface nodularity suggested that may reflect morphologic changes of cirrhosis. Gallbladder is physiologically distended without calculi. Pancreas: Normal Spleen: No splenomegaly. Adrenals/Urinary Tract: Adrenal glands are unremarkable. Kidneys are normal, without renal calculi, focal lesion, or hydronephrosis. Bladder is unremarkable. Stomach/Bowel: Centralized small bowel loops secondary to interval increase in moderate volume of ascites. Fluid-filled distention and thickening of small bowel measuring up to 3 cm in diameter is redemonstrated without transition point clearly identified. Some of this appearance may be sympathetic due to adjacent ascites. Distal ileal loops appears somewhat  thickened, series 3/55 and may reflect stigmata of inflammatory bowel disease. Unremarkable colon. The appendix is unremarkable seen adjacent to the right iliac crest. Stomach is somewhat decompressed in appearance which may account for some of its transmural thickening. No discrete mass of the stomach identified. Vascular/Lymphatic: Aortic atherosclerosis. No enlarged abdominal or pelvic lymph nodes. Reproductive: Prostate is unremarkable. Other: As stated, interval increase in ascites.  No free air. Musculoskeletal: No acute or significant osseous findings. IMPRESSION: 1. Interval increase in moderate volume of ascites with centralized small bowel loops demonstrating fluid-filled distention up to 3 cm. Relative decompressed appearance of small bowel distally though there is suggestion of mild thickening in the region of the distal ileum. Changes of inflammatory bowel might account for this appearance. 2. Ascites may be attributable to cirrhotic change of the liver given subtle surface nodularity. No splenomegaly is identified. Electronically Signed   By: DAshley RoyaltyM.D.   On: 09/24/2017 02:01   Dg Abdomen Acute W/chest  Result Date: 09/23/2017 CLINICAL DATA:  47y/o  M; 2-3 months of abdominal pain. EXAM: DG ABDOMEN ACUTE W/ 1V CHEST COMPARISON:  08/15/2017 abdomen radiographs. FINDINGS: There is no evidence of dilated bowel loops or free intraperitoneal air. No radiopaque calculi or other significant radiographic abnormality is seen. Heart size and mediastinal contours are within normal limits. Both lungs are clear. IMPRESSION: Negative abdominal radiographs.  No acute cardiopulmonary disease. Electronically Signed   By: LKristine GarbeM.D.   On: 09/23/2017 22:50   UKoreaScrotum W/doppler  Result Date: 09/24/2017 CLINICAL DATA:  47year old male with right testicular pain and tenderness. EXAM: SCROTAL ULTRASOUND DOPPLER ULTRASOUND OF THE TESTICLES TECHNIQUE: Complete ultrasound examination of the  testicles, epididymis, and other scrotal structures was performed. Color and spectral Doppler ultrasound were also utilized to evaluate blood flow to the testicles. COMPARISON:  None. FINDINGS: Right testicle Measurements: 4.7 x 2.0 x 2.6 cm. No mass or microlithiasis visualized. Left testicle Measurements: 4.3 x 2.2 x 2.7 cm. No mass or microlithiasis visualized. Right epididymis:  Normal in size and appearance. Left epididymis:  Normal in size and appearance. Hydrocele:  None visualized. Varicocele:  None visualized. Pulsed Doppler interrogation of both testes demonstrates normal low resistance arterial and venous waveforms bilaterally. IMPRESSION: Unremarkable testicular ultrasound. Doppler detected arterial and venous flow to both testicles. Electronically Signed   By: AAnner CreteM.D.   On: 09/24/2017 00:26    Procedures Procedures (including critical care time)  Medications Ordered in ED Medications  ondansetron (ZOFRAN) injection 4 mg (has no administration in time range)  ondansetron (ZOFRAN) injection 4 mg (4 mg Intravenous Given 09/23/17 2209)  sodium chloride 0.9 % bolus 500 mL (0 mLs Intravenous Stopped 09/23/17 2239)  iohexol (OMNIPAQUE) 300 MG/ML solution 100 mL (  100 mLs Intravenous Contrast Given 09/24/17 0032)  morphine 4 MG/ML injection 4 mg (4 mg Intravenous Given 09/24/17 0130)     Initial Impression / Assessment and Plan / ED Course  I have reviewed the triage vital signs and the nursing notes.  Pertinent labs & imaging results that were available during my care of the patient were reviewed by me and considered in my medical decision making (see chart for details).   1:47 PM Dr. Randal Buba contacted radiologist who reviewed the CT scan. Preliminary read per radiology showed no evidence of bowel obstruction, however did show significant ascites. Since pt has no known h/o cirrhosis and ascites is new for patient she recommended admitting the pt for r/o SBP.   1:54 PM CONSULT with  Dr. Harle Stanford who will admit the patient to the hospitalist service.   Final Clinical Impressions(s) / ED Diagnoses   Final diagnoses:  Other ascites   Pt presenting with abdominal that seems generalized in nature, but is worse in the RLQ. Also w/ complaints of right testicular pain x1 year. VSS, patient is afebrile. Abdomen does not appear to be grossly distended however he does have diffuse TTP, worse in the RLQ. No obvious hernia.  GU exam with normal cremasteric reflex. ttp to right posterior testicle and along perineal area. No evidence of fluid collection or cellulitis. No skin changes and do not suspect fourniers or other emergent pathology at this time. pts pain has been present for greater than one year. Scrotal US was order and was negative for torsion or evidence of epididymitis.   Ua negative for evidence of UTI. Culture sent. Cbc without leukocytosis. CMP With normal liver function and creatinine. Normal electrolyes. Lipase negative. Ct abd/pelvis preliminary read showed evidence of significant ascites. Given pts abdominal tenderness and new significant ascites, he will require admission for rule out SBP.   ED Discharge Orders    None       Bishop Dublin 09/24/17 0252    Isla Pence, MD 09/24/17 1729

## 2017-09-24 NOTE — ED Notes (Signed)
Patient transported to CT 

## 2017-09-24 NOTE — Progress Notes (Signed)
ANTIBIOTIC CONSULT NOTE - INITIAL  Pharmacy Consult for piperacillin/tazobactam  Indication: intraabdominal infection  Allergies  Allergen Reactions  . Asa [Aspirin] Rash    Childhood reaction; but tolerates well now    Patient Measurements: Height: 5' 10"  (177.8 cm) Weight: 127 lb 6.8 oz (57.8 kg) IBW/kg (Calculated) : 73   Vital Signs: Temp: 97.9 F (36.6 C) (05/07 0829) Temp Source: Oral (05/07 0829) BP: 121/89 (05/07 0955) Pulse Rate: 83 (05/07 0829) Intake/Output from previous day: 05/06 0701 - 05/07 0700 In: 500 [IV Piggyback:500] Out: -  Intake/Output from this shift: Total I/O In: 420 [P.O.:420] Out: 1500 [Other:1500]  Labs: Recent Labs    09/23/17 1236 09/24/17 0417  WBC 9.9 9.2  HGB 15.6 14.4  PLT 390 370  CREATININE 0.79 0.87   Estimated Creatinine Clearance: 87.7 mL/min (by C-G formula based on SCr of 0.87 mg/dL). No results for input(s): VANCOTROUGH, VANCOPEAK, VANCORANDOM, GENTTROUGH, GENTPEAK, GENTRANDOM, TOBRATROUGH, TOBRAPEAK, TOBRARND, AMIKACINPEAK, AMIKACINTROU, AMIKACIN in the last 72 hours.   Microbiology: Recent Results (from the past 720 hour(s))  Body fluid culture     Status: None (Preliminary result)   Collection Time: 09/24/17 12:00 PM  Result Value Ref Range Status   Specimen Description PERITONEAL CAVITY  Final   Special Requests NONE  Final   Gram Stain   Final    RARE WBC PRESENT,BOTH PMN AND MONONUCLEAR NO ORGANISMS SEEN Performed at [facility], [address] [place], [place] [address]    Culture PENDING  Incomplete   Report Status PENDING  Incomplete    Medical History: Past Medical History:  Diagnosis Date  . Abdominal pain 09/2017  . Anxiety attack   . Headache    "frequently" (08/14/2017)  . Heart murmur    "when I was a kid" (08/14/2017)  . Small bowel obstruction ([place]) 08/13/2017     Assessment: 47 yo male c/o worsening lower quadrant abdominal pain along with intermittent nausea and vomiting.  Pharmacy asked to dose piperacillin/tazobactam for intraabdominal infection. CrCl~87.     Goal of Therapy:  Resolution of infection   Plan:  Piperacillin/tazobactam 3.375gm IV q8h Monitor fever curve, clinical course, CBC and renal function.   Ramere Downs A Jen Benedict 09/24/2017,4:22 PM

## 2017-09-24 NOTE — ED Notes (Signed)
Pt. Tried of 5L nasal canula. Pt. SPO2 dropped to 87%. Pt. Reported feeling SHOB. Respiratory paged for veni mask

## 2017-09-24 NOTE — H&P (Signed)
History and Physical    Derek Lloyd UMP:[id number] DOB: 05-17-1971 DOA: 09/23/2017  PCP: Patient, [name]  Patient coming from: Home.  Chief Complaint: Abdominal pain.  HPI: Derek Lloyd is a 47 y.o. male with no significant past history who was admitted in March 2019 2 months ago for complaints of abdominal pain at the time there was concern for partial small bowel obstruction with terminal ileitis but patient signed out [place] presents to the ER with complaints of abdominal pain.  Patient states since his discharge last he has been having persistent abdominal pain but last 3 to 4 days it has worsened.  Today he also had multiple episodes of vomiting.  Does move his bowels had a bowel movement this morning.  Small amount and volume.  Pain is located mostly in the right lower quadrant.  Denies any fever chills.  ED Course: In the ER CT of the abdomen and pelvis shows diffuse ascites with possible concern for inflammatory bowel disease in the distal small bowel.  Patient is being admitted for further management.  Review of Systems: As per HPI, rest all negative.   Past Medical History:  Diagnosis Date  . Anxiety attack   . Headache    "frequently" (08/14/2017)  . Heart murmur    "when I was a kid" (08/14/2017)  . Small bowel obstruction ([place]) 08/13/2017    Past Surgical History:  Procedure Laterality Date  . FINGER SURGERY Left    "thumb"  . TYMPANOSTOMY TUBE PLACEMENT Bilateral      reports that he quit smoking about a year ago. His smoking use included cigarettes. He smoked 1.00 pack per day. He has never used smokeless tobacco. He reports that he drinks alcohol. He reports that he does not use drugs.  Allergies  Allergen Reactions  . Asa [Aspirin] Rash    Childhood reaction; but tolerates well now    Family History  Problem Relation Age of Onset  . Prostate cancer Paternal Grandfather   . Crohn's disease Neg Hx     Prior to Admission medications    Medication Sig Start Date End Date Taking? Authorizing Provider  Garlic 4008 MG CAPS Take 1,000 mg by mouth daily.   Yes [provider]  ibuprofen (ADVIL,MOTRIN) 200 MG tablet Take 600 mg by mouth every 6 (six) hours as needed.   Yes [provider]  OVER THE COUNTER MEDICATION Take 1 tablet by mouth daily. [place]   Yes [provider]  vitamin C (ASCORBIC ACID) 500 MG tablet Take 1,000 mg by mouth daily.   Yes [provider]    Physical Exam: Vitals:   09/23/17 1508 09/23/17 1816 09/23/17 2121 09/24/17 0124  BP: (!) 118/59 114/74 126/74 113/69  Pulse: 90 87 86 95  Resp: 18 16 16 16   Temp:  98 F (36.7 C) 98.4 F (36.9 C) 98.8 F (37.1 C)  TempSrc:  Oral  Oral  SpO2: 100% 100% 99% 98%      Constitutional: Moderately built and nourished. Vitals:   09/23/17 1508 09/23/17 1816 09/23/17 2121 09/24/17 0124  BP: (!) 118/59 114/74 126/74 113/69  Pulse: 90 87 86 95  Resp: 18 16 16 16   Temp:  98 F (36.7 C) 98.4 F (36.9 C) 98.8 F (37.1 C)  TempSrc:  Oral  Oral  SpO2: 100% 100% 99% 98%   Eyes: Anicteric no pallor. ENMT: No discharge from the ears eyes nose or mouth. Neck: No mass palpated no neck rigidity.  Respiratory: No rhonchi or crepitations. Cardiovascular: S1-S2 heard no murmurs appreciated. Abdomen: Distended diffusely tender bowel sounds not appreciated.  Mild guarding.  No rigidity. Musculoskeletal: No edema. Skin: No rash. Neurologic: Alert awake oriented to time place and person.  Moves all extremities. Psychiatric: Appears normal.  Normal affect.   Labs on Admission: I have personally reviewed following labs and imaging studies  CBC: Recent Labs  Lab 09/23/17 1236  WBC 9.9  HGB 15.6  HCT 45.1  MCV 95.3  PLT 355   Basic Metabolic Panel: Recent Labs  Lab 09/23/17 1236  NA 139  K 3.8  CL 103  CO2 25  GLUCOSE 90  BUN 7  CREATININE 0.79  CALCIUM 9.1   GFR: CrCl cannot be calculated (Unknown ideal  weight.). Liver Function Tests: Recent Labs  Lab 09/23/17 1236  AST 19  ALT 18  ALKPHOS 115  BILITOT 0.7  PROT 7.0  ALBUMIN 3.3*   Recent Labs  Lab 09/23/17 1236  LIPASE 22   No results for input(s): AMMONIA in the last 168 hours. Coagulation Profile: No results for input(s): INR, PROTIME in the last 168 hours. Cardiac Enzymes: No results for input(s): CKTOTAL, CKMB, CKMBINDEX, TROPONINI in the last 168 hours. BNP (last 3 results) No results for input(s): PROBNP in the last 8760 hours. HbA1C: No results for input(s): HGBA1C in the last 72 hours. CBG: No results for input(s): GLUCAP in the last 168 hours. Lipid Profile: No results for input(s): CHOL, HDL, LDLCALC, TRIG, CHOLHDL, LDLDIRECT in the last 72 hours. Thyroid Function Tests: No results for input(s): TSH, T4TOTAL, FREET4, T3FREE, THYROIDAB in the last 72 hours. Anemia Panel: No results for input(s): VITAMINB12, FOLATE, FERRITIN, TIBC, IRON, RETICCTPCT in the last 72 hours. Urine analysis:    Component Value Date/Time   COLORURINE YELLOW 09/24/2017 0122   APPEARANCEUR CLEAR 09/24/2017 0122   LABSPEC >1.046 (H) 09/24/2017 0122   PHURINE 5.0 09/24/2017 0122   GLUCOSEU NEGATIVE 09/24/2017 0122   HGBUR NEGATIVE 09/24/2017 0122   BILIRUBINUR NEGATIVE 09/24/2017 0122   KETONESUR 20 (A) 09/24/2017 0122   PROTEINUR NEGATIVE 09/24/2017 0122   UROBILINOGEN 1.0 11/13/2014 1800   NITRITE NEGATIVE 09/24/2017 0122   LEUKOCYTESUR NEGATIVE 09/24/2017 0122   Sepsis Labs: @LABRCNTIP (procalcitonin:4,lacticidven:4) )No results found for this or any previous visit (from the past 240 hour(s)).   Radiological Exams on Admission: Ct Abdomen Pelvis W Contrast  Result Date: 09/24/2017 CLINICAL DATA:  Abdominal pain onset 2-3 months with nausea, vomiting and soft stringy stool. EXAM: CT ABDOMEN AND PELVIS WITH CONTRAST TECHNIQUE: Multidetector CT imaging of the abdomen and pelvis was performed using the standard protocol following  bolus administration of intravenous contrast. CONTRAST:  160m OMNIPAQUE IOHEXOL 300 MG/ML  SOLN COMPARISON:  None. FINDINGS: Lower chest: Normal size heart without pericardial effusion. Bibasilar atelectasis. Hepatobiliary: No space-occupying mass of the liver. Mild surface nodularity suggested that may reflect morphologic changes of cirrhosis. Gallbladder is physiologically distended without calculi. Pancreas: Normal Spleen: No splenomegaly. Adrenals/Urinary Tract: Adrenal glands are unremarkable. Kidneys are normal, without renal calculi, focal lesion, or hydronephrosis. Bladder is unremarkable. Stomach/Bowel: Centralized small bowel loops secondary to interval increase in moderate volume of ascites. Fluid-filled distention and thickening of small bowel measuring up to 3 cm in diameter is redemonstrated without transition point clearly identified. Some of this appearance may be sympathetic due to adjacent ascites. Distal ileal loops appears somewhat thickened, series 3/55 and may reflect stigmata of inflammatory bowel disease. Unremarkable colon. The appendix is unremarkable seen adjacent to  the right iliac crest. Stomach is somewhat decompressed in appearance which may account for some of its transmural thickening. No discrete mass of the stomach identified. Vascular/Lymphatic: Aortic atherosclerosis. No enlarged abdominal or pelvic lymph nodes. Reproductive: Prostate is unremarkable. Other: As stated, interval increase in ascites.  No free air. Musculoskeletal: No acute or significant osseous findings. IMPRESSION: 1. Interval increase in moderate volume of ascites with centralized small bowel loops demonstrating fluid-filled distention up to 3 cm. Relative decompressed appearance of small bowel distally though there is suggestion of mild thickening in the region of the distal ileum. Changes of inflammatory bowel might account for this appearance. 2. Ascites may be attributable to cirrhotic change of the liver  given subtle surface nodularity. No splenomegaly is identified. Electronically Signed   By: Ashley Royalty M.D.   On: 09/24/2017 02:01   Dg Abdomen Acute W/chest  Result Date: 09/23/2017 CLINICAL DATA:  47 y/o  M; 2-3 months of abdominal pain. EXAM: DG ABDOMEN ACUTE W/ 1V CHEST COMPARISON:  08/15/2017 abdomen radiographs. FINDINGS: There is no evidence of dilated bowel loops or free intraperitoneal air. No radiopaque calculi or other significant radiographic abnormality is seen. Heart size and mediastinal contours are within normal limits. Both lungs are clear. IMPRESSION: Negative abdominal radiographs.  No acute cardiopulmonary disease. Electronically Signed   By: Kristine Garbe M.D.   On: 09/23/2017 22:50   US Scrotum W/doppler  Result Date: 09/24/2017 CLINICAL DATA:  47 year old male with right testicular pain and tenderness. EXAM: SCROTAL ULTRASOUND DOPPLER ULTRASOUND OF THE TESTICLES TECHNIQUE: Complete ultrasound examination of the testicles, epididymis, and other scrotal structures was performed. Color and spectral Doppler ultrasound were also utilized to evaluate blood flow to the testicles. COMPARISON:  None. FINDINGS: Right testicle Measurements: 4.7 x 2.0 x 2.6 cm. No mass or microlithiasis visualized. Left testicle Measurements: 4.3 x 2.2 x 2.7 cm. No mass or microlithiasis visualized. Right epididymis:  Normal in size and appearance. Left epididymis:  Normal in size and appearance. Hydrocele:  None visualized. Varicocele:  None visualized. Pulsed Doppler interrogation of both testes demonstrates normal low resistance arterial and venous waveforms bilaterally. IMPRESSION: Unremarkable testicular ultrasound. Doppler detected arterial and venous flow to both testicles. Electronically Signed   By: Anner Crete M.D.   On: 09/24/2017 00:26     Assessment/Plan Active Problems:   Abdominal pain   Ascites    1. Diffuse abdominal pain with concern for metabolic disease -we will  reconsult gastroenterologist in a.m.  Keep patient pain relief medications and liquid diet.  Repeat KUB in a.m. to make sure there is no obstruction developing. 2. Ascites with possible cirrhosis -we will place patient on SBP coverage with ceftriaxone.  Ordered paracentesis and check fluid for cell count Gram stain cytology and albumin.   DVT prophylaxis: SCDs. Code Status: Full code. Family Communication: Discussed with patient. Disposition Plan: Home. Consults called: None. Admission status: Inpatient.   Rise Patience MD [facility] Pager [phone].  If 7PM-7AM, please contact night-coverage [email or web address] Password [email or web address]  09/24/2017, 4:07 AM

## 2017-09-24 NOTE — Progress Notes (Addendum)
PROGRESS NOTE  Derek Lloyd FMB:[id number] DOB: 1970/06/26 DOA: 09/23/2017 PCP: Patient, [name]  Brief narrative  Derek Lloyd is a 47 y.o. year old male with  medical history significant for chronic ongoing abdominal pain for the past year, recent admission (08/15/2017) for abdominal pain and vomiting with CT abdomen showing partial SBO and concern for terminal ileitis started on IV solumedrol by GI due to high suspicion of Crohn's disease but he left AMA refusing NG tube placement for partial SBO, 6 months prior to that admission he also left AMA during treatment of abdominal abscess who presented on 09/23/2017 with again abdominal pain and was found to have moderate ascites on CT abdomen with distal thickening of ileum concerning for IBD. Patient has been hemodynamically stable with unremarkable lab work ( wnl LFTs, CBC, lipase, UA, troponin)  Interval History 1.5 L of peritoneal fluid removed by paracentesis this am   ROS: reports abdominal pain, bowel incontinence, diarrhea, blood in stool, no fevers, chills, or chest pain    Subjective Most of pain in right lower abdomen.  Somewhat better after paracentesis  Assessment/Plan: Active Problems:   Abdominal pain   Ascites   1. Diffuse abdominal pain in patient with distal ileum thickening.  Concern for terminal ileitis on CT abdomen likely represents Crohn's disease. Patient with abdominal pain, at least 4 loose stool daily with episodes of bowel incontinence, reports blood in stool chronically as well.  There was concern for Crohn's on his last admission before he left AMA.   Will consult GI for further recommendations.  All other abdominal work-up has been negative (within normal limits lipase, UA, LFTs, CBC) patient remains afebrile.  Without constipation or diarrhea. IVpain control  2. Ascites. S/p 1.5 L hazy fluid from paracentesis. F/u diagnostic labs.   Area of nodular pattern to the liver on CT abdomen,  ?Cirrhosis. Admits to  prior heavy alcohol use over 20 years ago, reports he only drinks 12 pack/month.   Empirically placed on ceftriaxone for SBP coverage given abdominal pain.  F/u peritoneal obtain cell count, Gram stain, cytology and albumin. Await GI recommendations   Code Status: Full  Family Communication: no family at bedside  Disposition Plan: GI consult, monitor abdominal pain, f/u labs from therapeutic/diagnostic paracentesis   Consultants:  GI  Procedures:  Paracentesis 5/7- 1.5 L hazy, amber fluid removed  Antimicrobials:  Ceftriaxone  Cultures:  Urine, 5/7, pending  Telemetry:  DVT prophylaxis: SCDs   Objective: Vitals:   09/23/17 2121 09/24/17 0124 09/24/17 0413 09/24/17 0652  BP: 126/74 113/69 112/80 115/74  Pulse: 86 95 85 87  Resp: 16 16  18   Temp: 98.4 F (36.9 C) 98.8 F (37.1 C)    TempSrc:  Oral    SpO2: 99% 98% 98% 97%    Intake/Output Summary (Last 24 hours) at 09/24/2017 0806 Last data filed at 09/23/2017 2239 Gross per 24 hour  Intake 500 ml  Output -  Net 500 ml   There were no vitals filed for this visit.  Exam:  Constitutional: male who looks much older than stated age, in no distress Eyes: EOMI, anicteric ENMT: Oropharynx with moist mucous membranes, poor dentition Cardiovascular: No peripheral edema Respiratory: Normal respiratory effort on room air,  Abdomen: Soft, non-distended, tenderness greatest with palpation of right lower quadrant, guarding, no rebound tenderness Skin: No rash ulcers, or lesions. Without skin tenting  Neurologic: Grossly no focal neuro deficit. Psychiatric:Appropriate affect, and mood. Mental status AAOx3  Data Reviewed: CBC: Recent  Labs  Lab 09/23/17 1236 09/24/17 0417  WBC 9.9 9.2  NEUTROABS  --  6.2  HGB 15.6 14.4  HCT 45.1 42.8  MCV 95.3 96.4  PLT 390 716   Basic Metabolic Panel: Recent Labs  Lab 09/23/17 1236 09/24/17 0417  NA 139 139  K 3.8 3.7  CL 103 104  CO2 25 25  GLUCOSE 90 82  BUN 7 9    CREATININE 0.79 0.87  CALCIUM 9.1 8.7*   GFR: CrCl cannot be calculated (Unknown ideal weight.). Liver Function Tests: Recent Labs  Lab 09/23/17 1236 09/24/17 0417  AST 19 15  ALT 18 14*  ALKPHOS 115 107  BILITOT 0.7 1.2  PROT 7.0 6.1*  ALBUMIN 3.3* 3.0*   Recent Labs  Lab 09/23/17 1236  LIPASE 22   No results for input(s): AMMONIA in the last 168 hours. Coagulation Profile: No results for input(s): INR, PROTIME in the last 168 hours. Cardiac Enzymes: No results for input(s): CKTOTAL, CKMB, CKMBINDEX, TROPONINI in the last 168 hours. BNP (last 3 results) No results for input(s): PROBNP in the last 8760 hours. HbA1C: No results for input(s): HGBA1C in the last 72 hours. CBG: No results for input(s): GLUCAP in the last 168 hours. Lipid Profile: No results for input(s): CHOL, HDL, LDLCALC, TRIG, CHOLHDL, LDLDIRECT in the last 72 hours. Thyroid Function Tests: No results for input(s): TSH, T4TOTAL, FREET4, T3FREE, THYROIDAB in the last 72 hours. Anemia Panel: No results for input(s): VITAMINB12, FOLATE, FERRITIN, TIBC, IRON, RETICCTPCT in the last 72 hours. Urine analysis:    Component Value Date/Time   COLORURINE YELLOW 09/24/2017 0122   APPEARANCEUR CLEAR 09/24/2017 0122   LABSPEC >1.046 (H) 09/24/2017 0122   PHURINE 5.0 09/24/2017 0122   GLUCOSEU NEGATIVE 09/24/2017 0122   HGBUR NEGATIVE 09/24/2017 0122   BILIRUBINUR NEGATIVE 09/24/2017 0122   KETONESUR 20 (A) 09/24/2017 0122   PROTEINUR NEGATIVE 09/24/2017 0122   UROBILINOGEN 1.0 11/13/2014 1800   NITRITE NEGATIVE 09/24/2017 0122   LEUKOCYTESUR NEGATIVE 09/24/2017 0122   Sepsis Labs: @LABRCNTIP (procalcitonin:4,lacticidven:4)  )No results found for this or any previous visit (from the past 240 hour(s)).    Studies: Ct Abdomen Pelvis W Contrast  Result Date: 09/24/2017 CLINICAL DATA:  Abdominal pain onset 2-3 months with nausea, vomiting and soft stringy stool. EXAM: CT ABDOMEN AND PELVIS WITH CONTRAST  TECHNIQUE: Multidetector CT imaging of the abdomen and pelvis was performed using the standard protocol following bolus administration of intravenous contrast. CONTRAST:  157m OMNIPAQUE IOHEXOL 300 MG/ML  SOLN COMPARISON:  None. FINDINGS: Lower chest: Normal size heart without pericardial effusion. Bibasilar atelectasis. Hepatobiliary: No space-occupying mass of the liver. Mild surface nodularity suggested that may reflect morphologic changes of cirrhosis. Gallbladder is physiologically distended without calculi. Pancreas: Normal Spleen: No splenomegaly. Adrenals/Urinary Tract: Adrenal glands are unremarkable. Kidneys are normal, without renal calculi, focal lesion, or hydronephrosis. Bladder is unremarkable. Stomach/Bowel: Centralized small bowel loops secondary to interval increase in moderate volume of ascites. Fluid-filled distention and thickening of small bowel measuring up to 3 cm in diameter is redemonstrated without transition point clearly identified. Some of this appearance may be sympathetic due to adjacent ascites. Distal ileal loops appears somewhat thickened, series 3/55 and may reflect stigmata of inflammatory bowel disease. Unremarkable colon. The appendix is unremarkable seen adjacent to the right iliac crest. Stomach is somewhat decompressed in appearance which may account for some of its transmural thickening. No discrete mass of the stomach identified. Vascular/Lymphatic: Aortic atherosclerosis. No enlarged abdominal or pelvic lymph nodes.  Reproductive: Prostate is unremarkable. Other: As stated, interval increase in ascites.  No free air. Musculoskeletal: No acute or significant osseous findings. IMPRESSION: 1. Interval increase in moderate volume of ascites with centralized small bowel loops demonstrating fluid-filled distention up to 3 cm. Relative decompressed appearance of small bowel distally though there is suggestion of mild thickening in the region of the distal ileum. Changes of  inflammatory bowel might account for this appearance. 2. Ascites may be attributable to cirrhotic change of the liver given subtle surface nodularity. No splenomegaly is identified. Electronically Signed   By: Ashley Royalty M.D.   On: 09/24/2017 02:01   Dg Abdomen Acute W/chest  Result Date: 09/23/2017 CLINICAL DATA:  47 y/o  M; 2-3 months of abdominal pain. EXAM: DG ABDOMEN ACUTE W/ 1V CHEST COMPARISON:  08/15/2017 abdomen radiographs. FINDINGS: There is no evidence of dilated bowel loops or free intraperitoneal air. No radiopaque calculi or other significant radiographic abnormality is seen. Heart size and mediastinal contours are within normal limits. Both lungs are clear. IMPRESSION: Negative abdominal radiographs.  No acute cardiopulmonary disease. Electronically Signed   By: Kristine Garbe M.D.   On: 09/23/2017 22:50   US Scrotum W/doppler  Result Date: 09/24/2017 CLINICAL DATA:  46 year old male with right testicular pain and tenderness. EXAM: SCROTAL ULTRASOUND DOPPLER ULTRASOUND OF THE TESTICLES TECHNIQUE: Complete ultrasound examination of the testicles, epididymis, and other scrotal structures was performed. Color and spectral Doppler ultrasound were also utilized to evaluate blood flow to the testicles. COMPARISON:  None. FINDINGS: Right testicle Measurements: 4.7 x 2.0 x 2.6 cm. No mass or microlithiasis visualized. Left testicle Measurements: 4.3 x 2.2 x 2.7 cm. No mass or microlithiasis visualized. Right epididymis:  Normal in size and appearance. Left epididymis:  Normal in size and appearance. Hydrocele:  None visualized. Varicocele:  None visualized. Pulsed Doppler interrogation of both testes demonstrates normal low resistance arterial and venous waveforms bilaterally. IMPRESSION: Unremarkable testicular ultrasound. Doppler detected arterial and venous flow to both testicles. Electronically Signed   By: Anner Crete M.D.   On: 09/24/2017 00:26    Scheduled Meds:  Continuous  Infusions: . cefTRIAXone (ROCEPHIN)  IV       LOS: 0 days     Desiree Hane, MD [facility] Pager [phone]  If 7PM-7AM, please contact night-coverage [email or web address] Password [email or web address] 09/24/2017, 8:06 AM

## 2017-09-25 LAB — COMPREHENSIVE METABOLIC PANEL
ALBUMIN: 2.9 g/dL — AB (ref 3.5–5.0)
ALK PHOS: 113 U/L (ref 38–126)
ALT: 14 U/L — ABNORMAL LOW (ref 17–63)
ANION GAP: 9 (ref 5–15)
AST: 17 U/L (ref 15–41)
BUN: 6 mg/dL (ref 6–20)
CO2: 27 mmol/L (ref 22–32)
Calcium: 8.6 mg/dL — ABNORMAL LOW (ref 8.9–10.3)
Chloride: 100 mmol/L — ABNORMAL LOW (ref 101–111)
Creatinine, Ser: 0.8 mg/dL (ref 0.61–1.24)
GFR calc Af Amer: >60 mL/min (ref >60)
GFR calc non Af Amer: >60 mL/min (ref >60)
GLUCOSE: 97 mg/dL (ref 65–99)
POTASSIUM: 3.7 mmol/L (ref 3.5–5.1)
SODIUM: 136 mmol/L (ref 135–145)
Total Bilirubin: 1 mg/dL (ref 0.3–1.2)
Total Protein: 6.1 g/dL — ABNORMAL LOW (ref 6.5–8.1)

## 2017-09-25 LAB — GLUCOSE, CAPILLARY
GLUCOSE-CAPILLARY: 81 mg/dL (ref 65–99)
Glucose-Capillary: 87 mg/dL (ref 65–99)
Glucose-Capillary: 89 mg/dL (ref 65–99)

## 2017-09-25 LAB — PROTIME-INR
INR: 1.08
Prothrombin Time: 14 seconds (ref 11.4–15.2)

## 2017-09-25 LAB — CBC
HCT: 42.6 % (ref 39.0–52.0)
HEMOGLOBIN: 14.3 g/dL (ref 13.0–17.0)
MCH: 31.7 pg (ref 26.0–34.0)
MCHC: 33.6 g/dL (ref 30.0–36.0)
MCV: 94.5 fL (ref 78.0–100.0)
Platelets: 371 10*3/uL (ref 150–400)
RBC: 4.51 MIL/uL (ref 4.22–5.81)
RDW: 12.2 % (ref 11.5–15.5)
WBC: 9.2 10*3/uL (ref 4.0–10.5)

## 2017-09-25 LAB — URINE CULTURE: Culture: NO GROWTH

## 2017-09-25 MED ORDER — METRONIDAZOLE IN NACL 5-0.79 MG/ML-% IV SOLN
500.0000 mg | Freq: Three times a day (TID) | INTRAVENOUS | Status: DC
  Administered 2017-09-25 – 2017-10-03 (×23): 500 mg via INTRAVENOUS
  Filled 2017-09-25 (×23): qty 100

## 2017-09-25 MED ORDER — DIPHENHYDRAMINE HCL 25 MG PO CAPS
25.0000 mg | ORAL_CAPSULE | Freq: Once | ORAL | Status: AC
  Administered 2017-09-25: 25 mg via ORAL
  Filled 2017-09-25: qty 1

## 2017-09-25 MED ORDER — OXYCODONE-ACETAMINOPHEN 5-325 MG PO TABS
1.0000 | ORAL_TABLET | Freq: Four times a day (QID) | ORAL | Status: DC | PRN
  Administered 2017-09-25 – 2017-10-03 (×16): 1 via ORAL
  Filled 2017-09-25 (×17): qty 1

## 2017-09-25 MED ORDER — SODIUM CHLORIDE 0.9 % IV SOLN
2.0000 g | INTRAVENOUS | Status: DC
  Administered 2017-09-25 – 2017-10-02 (×8): 2 g via INTRAVENOUS
  Filled 2017-09-25 (×9): qty 20

## 2017-09-25 NOTE — Progress Notes (Signed)
PROGRESS NOTE  Iden Stripling AUE:[id number] DOB: Jun 25, 1970 DOA: 09/23/2017 PCP: Patient, [name]  Brief narrative  Kara Mierzejewski is a 47 y.o. year old male with  medical history significant for chronic ongoing abdominal pain for the past year, recent admission (08/15/2017) for abdominal pain and vomiting with CT abdomen showing partial SBO and concern for terminal ileitis started on IV solumedrol by GI due to high suspicion of Crohn's disease but he left AMA refusing NG tube placement for partial SBO, 6 months prior to that admission he also left AMA during treatment of abdominal abscess who presented on 09/23/2017 with recurrentrecurrent abdominal pain and was found to have moderate ascites on CT abdomen with distal thickening of ileum concerning for IBD. Patient has been hemodynamically stable with unremarkable lab work ( wnl LFTs, CBC, lipase, UA, troponin)  Interval History No acute events overnight   ROS: Positive abdominal pain, nausea.  No fevers, chills or chest pain    Subjective Planing of lower abdomen pain.  Still tolerating diet.  No nausea or emesisattending of lower abdominal pain. Still tolerate diet. No  emesis  Assessment/Plan: Active Problems:   Abdominal pain   Ascites   1. Diffuse abdominal pain in patient with distal ileum thickening.  Concern for terminal ileitis on CT abdomen likely represents Crohn's disease. Patient with abdominal pain, at least 4 loose stool daily with episodes of bowel incontinence, reports blood in stool chronically as well.  There was concern for Crohn's on his last admission before he left AMA.   Will consult GI for further recommendations.  All other abdominal work-up has been negative (within normal limits lipase, UA, LFTs, CBC) patient remains afebrile.  Without constipation or diarrhea. IV pain control, bowel regimen. Added PRN oral percocet for better long acting pain for moderate pain with continued Prn IV dilaudid for severe  pain  2. Ascites with concern for secondary bacterial peritonitis.  Switched to IV Zosyn on 5/7 ( from empiric Ceftriaxone) as fluid analysis with > 6000 WBC ( 31% neutrophils) consistent with secondary bacterial peritonitis. S/p 1.5 L hazy fluid from paracentesis on 5/7. No known hx of cirrhosis and denies current alcohol abuse . Area of nodular pattern to the liver on CT abdomen/ f/u hepatitis panel, peritoneal culture and cytology. Appreciate GI recommendations   Code Status: Full  Family Communication: no family at bedside  Disposition Plan: pending peritoneal fluid culture, GI recommendations, f/u hepatitis panel, IV zosyn, monitor abdominal pain,   Consultants:  GI  Procedures:  Paracentesis 5/7- 1.5 L hazy, amber fluid removed  Antimicrobials:  Ceftriaxone 5/7  Zosyn 5/7--  Cultures:  Urine, 5/7, pending  Peritoneal fluid culture, pending  Telemetry: no  DVT prophylaxis: SCDs   Objective: Vitals:   09/24/17 0924 09/24/17 0955 09/24/17 2211 09/25/17 0603  BP: 121/78 121/89 112/73 118/75  Pulse:   85 87  Resp:   18 18  Temp:   98.7 F (37.1 C) 98.2 F (36.8 C)  TempSrc:    Oral  SpO2:   94% 96%  Weight:      Height:        Intake/Output Summary (Last 24 hours) at 09/25/2017 1157 Last data filed at 09/25/2017 0900 Gross per 24 hour  Intake 920 ml  Output -  Net 920 ml   Filed Weights   09/24/17 0830  Weight: 57.8 kg (127 lb 6.8 oz)    Exam:  Constitutional: male who looks much older than stated age, in no distress Eyes:  EOMI, anicteric ENMT: Oropharynx with moist mucous membranes, poor dentition Cardiovascular: No peripheral edema Respiratory: Normal respiratory effort on room air,  Abdomen: Soft, slightly distended, tenderness greatest with palpation in lower quadrants, guarding, no rebound tenderness, normal bowel sounds  Neurologic: Grossly no focal neuro deficit. Psychiatric:Appropriate affect. Irritable mood. Mental status AAOx3  Data  Reviewed: CBC: Recent Labs  Lab 09/23/17 1236 09/24/17 0417 09/25/17 0355  WBC 9.9 9.2 9.2  NEUTROABS  --  6.2  --   HGB 15.6 14.4 14.3  HCT 45.1 42.8 42.6  MCV 95.3 96.4 94.5  PLT 390 370 628   Basic Metabolic Panel: Recent Labs  Lab 09/23/17 1236 09/24/17 0417 09/25/17 0355  NA 139 139 136  K 3.8 3.7 3.7  CL 103 104 100*  CO2 25 25 27   GLUCOSE 90 82 97  BUN 7 9 6   CREATININE 0.79 0.87 0.80  CALCIUM 9.1 8.7* 8.6*   GFR: Estimated Creatinine Clearance: 95.3 mL/min (by C-G formula based on SCr of 0.8 mg/dL). Liver Function Tests: Recent Labs  Lab 09/23/17 1236 09/24/17 0417 09/25/17 0355  AST 19 15 17   ALT 18 14* 14*  ALKPHOS 115 107 113  BILITOT 0.7 1.2 1.0  PROT 7.0 6.1* 6.1*  ALBUMIN 3.3* 3.0* 2.9*   Recent Labs  Lab 09/23/17 1236  LIPASE 22   No results for input(s): AMMONIA in the last 168 hours. Coagulation Profile: Recent Labs  Lab 09/25/17 0355  INR 1.08   Cardiac Enzymes: No results for input(s): CKTOTAL, CKMB, CKMBINDEX, TROPONINI in the last 168 hours. BNP (last 3 results) No results for input(s): PROBNP in the last 8760 hours. HbA1C: No results for input(s): HGBA1C in the last 72 hours. CBG: Recent Labs  Lab 09/24/17 0905 09/24/17 1658 09/25/17 0803  GLUCAP 77 88 89   Lipid Profile: No results for input(s): CHOL, HDL, LDLCALC, TRIG, CHOLHDL, LDLDIRECT in the last 72 hours. Thyroid Function Tests: No results for input(s): TSH, T4TOTAL, FREET4, T3FREE, THYROIDAB in the last 72 hours. Anemia Panel: No results for input(s): VITAMINB12, FOLATE, FERRITIN, TIBC, IRON, RETICCTPCT in the last 72 hours. Urine analysis:    Component Value Date/Time   COLORURINE YELLOW 09/24/2017 0122   APPEARANCEUR CLEAR 09/24/2017 0122   LABSPEC >1.046 (H) 09/24/2017 0122   PHURINE 5.0 09/24/2017 0122   GLUCOSEU NEGATIVE 09/24/2017 0122   HGBUR NEGATIVE 09/24/2017 0122   BILIRUBINUR NEGATIVE 09/24/2017 0122   KETONESUR 20 (A) 09/24/2017 0122    PROTEINUR NEGATIVE 09/24/2017 0122   UROBILINOGEN 1.0 11/13/2014 1800   NITRITE NEGATIVE 09/24/2017 0122   LEUKOCYTESUR NEGATIVE 09/24/2017 0122   Sepsis Labs: @LABRCNTIP (procalcitonin:4,lacticidven:4)  ) Recent Results (from the past 240 hour(s))  Urine culture     Status: None   Collection Time: 09/24/17  1:22 AM  Result Value Ref Range Status   Specimen Description URINE, RANDOM  Final   Special Requests STERILE CONTAINER SENT  Final   Culture   Final    NO GROWTH Performed at [facility], [place] [address], [place], [place] [address]    Report Status 09/25/2017 FINAL  Final  Body fluid culture     Status: None (Preliminary result)   Collection Time: 09/24/17 12:00 PM  Result Value Ref Range Status   Specimen Description PERITONEAL CAVITY  Final   Special Requests NONE  Final   Gram Stain   Final    RARE WBC PRESENT,BOTH PMN AND MONONUCLEAR NO ORGANISMS SEEN    Culture   Final  NO GROWTH < 24 HOURS Performed at [facility] [address], [place], [place] [address]    Report Status PENDING  Incomplete      Studies: No results found.  Scheduled Meds:  Continuous Infusions: . piperacillin-tazobactam (ZOSYN)  IV 3.375 g (09/25/17 0752)     LOS: 1 day     Desiree Hane, MD [facility] Pager [phone]  If 7PM-7AM, please contact night-coverage [email or web address] Password [facility] 09/25/2017, 11:57 AM

## 2017-09-25 NOTE — Progress Notes (Signed)
Pharmacy Antibiotic Note  Derek Lloyd is a 47 y.o. male admitted on 09/23/2017 with intra-abdominal infection.  Pharmacy has been consulted for Levaquin + Flagyl dosing due to patient developing possible allergic reaction to Zosyn. Spoke with Dr. Lonny Prude and will change this to ceftriaxone + Flagyl, as patient has tolerated ceftriaxone here this admit and in the past. Peritoneal fluid cx negative to date. UC negative. Afeb, wbc wnl.  Plan: Ceftriaxone 2g IV q24h Flagyl 528m IV q8h F/u clinical progress, c/s LOT  Height: 5' 10"  (177.8 cm) Weight: 127 lb 6.8 oz (57.8 kg) IBW/kg (Calculated) : 73  Temp (24hrs), Avg:98.5 F (36.9 C), Min:98.2 F (36.8 C), Max:98.7 F (37.1 C)  Recent Labs  Lab 09/23/17 1236 09/24/17 0417 09/24/17 0841 09/24/17 1140 09/25/17 0355  WBC 9.9 9.2  --   --  9.2  CREATININE 0.79 0.87  --   --  0.80  LATICACIDVEN  --   --  0.6 0.7  --     Estimated Creatinine Clearance: 95.3 mL/min (by C-G formula based on SCr of 0.8 mg/dL).    Allergies  Allergen Reactions  . Asa [Aspirin] Rash    Childhood reaction; but tolerates well now    HElicia Lamp PharmD, BCPS Clinical Pharmacist Clinical phone for 09/25/2017 until 3:30pm: [phone] after 3:30pm, please call main pharmacy at: [phone] 09/25/2017 12:59 PM

## 2017-09-25 NOTE — Progress Notes (Signed)
[facility] Gastroenterology Progress Note  Derek Lloyd 47 y.o. 06/27/1970  CC:  Abdominal pain, possible Crohn's disease.   Subjective: patient continues to have abdominal pain. Remains afebrile. Denied any nausea or vomiting. Denied any blood in the stool or black stool. Complaining of itching as well as rash on the abdomen which started yesterday.  ROS : negative for chest pain shortness of breath. Positive for rash and itching.   Objective: Vital signs in last 24 hours: Vitals:   09/24/17 2211 09/25/17 0603  BP: 112/73 118/75  Pulse: 85 87  Resp: 18 18  Temp: 98.7 F (37.1 C) 98.2 F (36.8 C)  SpO2: 94% 96%    Physical Exam:  General:  Alert, cooperative, no distress, appears stated age  Head:  Normocephalic, without obvious abnormality, atraumatic  Eyes:  , EOM's intact,   Lungs:   Clear to auscultation bilaterally, respirations unlabored  Heart:  Regular rate and rhythm, S1, S2 normal  Abdomen:   Soft, denies abdominal distention with periumbilical tenderness to palpation. Bowel sounds present. No peritoneal signs. Diffuse petechiae/rash noted   Extremities: Extremities normal, atraumatic, no  edema  Skin  Pink rash over central and upper abdominal area as well as in the back. No evidence of rash on the extremity.    Lab Results: Recent Labs    09/24/17 0417 09/25/17 0355  NA 139 136  K 3.7 3.7  CL 104 100*  CO2 25 27  GLUCOSE 82 97  BUN 9 6  CREATININE 0.87 0.80  CALCIUM 8.7* 8.6*   Recent Labs    09/24/17 0417 09/25/17 0355  AST 15 17  ALT 14* 14*  ALKPHOS 107 113  BILITOT 1.2 1.0  PROT 6.1* 6.1*  ALBUMIN 3.0* 2.9*   Recent Labs    09/24/17 0417 09/25/17 0355  WBC 9.2 9.2  NEUTROABS 6.2  --   HGB 14.4 14.3  HCT 42.8 42.6  MCV 96.4 94.5  PLT 370 371   Recent Labs    09/25/17 0355  LABPROT 14.0  INR 1.08      Assessment/Plan: - abdominal pain with CT scan showing thickening of the distal  Ileum.patient was admitted with similar  symptoms in March 2019 and CT scan at that time showed small bowel obstruction with possible thickening of the terminal ileum Patient left AMA  - ascites. Fluid analysis positive for increased white counts 6050 with 31 % PMNS. in the ascites fluid.CT scan finding concerning for cirrhosis. Patient has normal LFTs. Normal platelet counts. Ascites total protein count 4.4. Albumin 2.6. SAAG 0.4 . Finding concerning for secondary bacterial peritonitis. - itching and rash.   Recommendations ----------------------- - patient with possible allergic reaction to Zosyn. DC Zosyn. Start levofloxacin and Flagyl for secondary bacterial peritonitis.consider repeat diagnostic paracentesis next few days. Follow cytology and fluid culture. - By mouth Benadryl 1 dose. - Colonoscopy once acute issues are resolved. - follow hepatitis panel - GI will follow      Otis Brace MD, [place] 09/25/2017, 12:14 PM  Contact #  [phone]

## 2017-09-26 ENCOUNTER — Inpatient Hospital Stay (HOSPITAL_COMMUNITY): Payer: BLUE CROSS/BLUE SHIELD

## 2017-09-26 DIAGNOSIS — K589 Irritable bowel syndrome without diarrhea: Secondary | ICD-10-CM

## 2017-09-26 DIAGNOSIS — Z886 Allergy status to analgesic agent status: Secondary | ICD-10-CM

## 2017-09-26 DIAGNOSIS — K652 Spontaneous bacterial peritonitis: Principal | ICD-10-CM

## 2017-09-26 DIAGNOSIS — Z8719 Personal history of other diseases of the digestive system: Secondary | ICD-10-CM

## 2017-09-26 DIAGNOSIS — T360X5A Adverse effect of penicillins, initial encounter: Secondary | ICD-10-CM

## 2017-09-26 DIAGNOSIS — N50811 Right testicular pain: Secondary | ICD-10-CM

## 2017-09-26 DIAGNOSIS — L270 Generalized skin eruption due to drugs and medicaments taken internally: Secondary | ICD-10-CM

## 2017-09-26 DIAGNOSIS — Z87891 Personal history of nicotine dependence: Secondary | ICD-10-CM

## 2017-09-26 LAB — GLUCOSE, CAPILLARY
GLUCOSE-CAPILLARY: 75 mg/dL (ref 65–99)
Glucose-Capillary: 96 mg/dL (ref 65–99)

## 2017-09-26 LAB — HEPATITIS C ANTIBODY (REFLEX)

## 2017-09-26 LAB — HCV COMMENT:

## 2017-09-26 LAB — HEPATITIS B SURFACE ANTIGEN: Hepatitis B Surface Ag: NEGATIVE

## 2017-09-26 MED ORDER — IOHEXOL 300 MG/ML  SOLN
100.0000 mL | Freq: Once | INTRAMUSCULAR | Status: AC | PRN
  Administered 2017-09-26: 100 mL via INTRAVENOUS

## 2017-09-26 NOTE — Plan of Care (Signed)

## 2017-09-26 NOTE — Consult Note (Signed)
[facility] for Infectious Disease    Date of Admission:  09/23/2017                 Reason for Consult: Spontaneous Bacterial Peritonitis  Referring Provider: Posey Pronto  Primary Care Provider: Patient, [name]   Assessment/Plan:  Mr. Vavra is a 47 y/o male with previous history of small bowel obstruction who experienced increased abdominal pain and found to have spontaneous bacterial peritonitis possibly in the setting of irritable bowel disease. Peritoneal fluid with WBC of 5050 and 31% PMNS as well as elevated protein levels. Peritoneal cultures are without growth to date. Hospital course thus far was complicated by potential rash associated with Zosyn. Currently receiving Ceftriaxone and Flagyl. Has been improving since admission and awaiting repeat CT scan. He has been afebrile with no leukocytosis since admission. Pain improving now in the RLQ and LUQ.   1. Agree with ceftriaxone and Flagyl.  2. Continue to monitor cultures and CT scan results.   Will continue to follow.   Active Problems:   Abdominal pain   Ascites   HPI: Kuzey Ogata is a 47 y.o. male with previous medical history of small bowel obstruction and heart murmur who presented to the ED and was admitted to the hospital on 09/24/17 with the chief complaint of RLQ pain ongoing for about 3 months and worsening 3-4 days prior to arrival. He also noted having pain in the right testicle and perineal region that is chronic but worsened with the abdominal pain. Most recently treated for SBO and suspected ileitis in March 2019. Workup was suspicious for IBD. CT of the abdomen with moderate volume of ascites with centralized small bowel loops demonstarting fluid-filled distention. GI consulted and concerned for secondary bacterial peritonitis with WBC 6050 and 31% PMN. Antibiotics were changed to Zoysn. He developed a rash with concern for Zoysn as the origin and placed on Ceftriaxone and Flagyl. He has been afebrile  since admission with no leukocytosis. Peritoneal cultures show no growth to date currently. Patient declined repeat paracentesis and is awaiting a follow up CT scan.     Review of Systems: Review of Systems  Constitutional: Negative for chills and fever.  Respiratory: Negative for cough, sputum production and shortness of breath.   Cardiovascular: Negative for chest pain and leg swelling.  Gastrointestinal: Positive for abdominal pain. Negative for constipation, diarrhea, nausea and vomiting.  Genitourinary: Negative for dysuria, flank pain, frequency, hematuria and urgency.  Skin: Positive for rash.     Past Medical History:  Diagnosis Date  . Abdominal pain 09/2017  . Anxiety attack   . Headache    "frequently" (08/14/2017)  . Heart murmur    "when I was a kid" (08/14/2017)  . Small bowel obstruction ([place]) 08/13/2017    Social History   Tobacco Use  . Smoking status: Former Smoker    Packs/day: 1.00    Types: Cigarettes    Last attempt to quit: 09/28/2016    Years since quitting: 0.9  . Smokeless tobacco: Never Used  . Tobacco comment: 08/14/2017 "I quit; can't tell you when I quit or when I started"  Substance Use Topics  . Alcohol use: Yes    Comment: 08/14/2017 "I drink when I feel like it; nothing recently"  . Drug use: No    Family History  Problem Relation Age of Onset  . Prostate cancer Paternal Grandfather   . Crohn's disease Neg Hx     Allergies  Allergen Reactions  .  Asa [Aspirin] Rash    Childhood reaction; but tolerates well now    OBJECTIVE: Blood pressure 117/74, pulse 88, temperature 98.2 F (36.8 C), temperature source Oral, resp. rate 18, height 5' 10"  (1.778 m), weight 125 lb 3.5 oz (56.8 kg), SpO2 98 %.  Physical Exam  Constitutional: No distress.  Lying in bed with head of bed up. Pleasant.   Cardiovascular: Normal rate, regular rhythm, normal heart sounds and intact distal pulses. Exam reveals no gallop and no friction rub.  No murmur  heard. Pulmonary/Chest: Effort normal and breath sounds normal. No stridor. No respiratory distress. He has no wheezes. He has no rales. He exhibits no tenderness.  Abdominal: He exhibits distension. He exhibits no mass. There is no hepatosplenomegaly. There is tenderness in the right lower quadrant and left upper quadrant. There is no rigidity, no rebound, no guarding, no tenderness at McBurney's point and negative Murphy's sign.    Lab Results Lab Results  Component Value Date   WBC 9.2 09/25/2017   HGB 14.3 09/25/2017   HCT 42.6 09/25/2017   MCV 94.5 09/25/2017   PLT 371 09/25/2017    Lab Results  Component Value Date   CREATININE 0.80 09/25/2017   BUN 6 09/25/2017   NA 136 09/25/2017   K 3.7 09/25/2017   CL 100 (L) 09/25/2017   CO2 27 09/25/2017    Lab Results  Component Value Date   ALT 14 (L) 09/25/2017   AST 17 09/25/2017   ALKPHOS 113 09/25/2017   BILITOT 1.0 09/25/2017     Microbiology: Recent Results (from the past 240 hour(s))  Urine culture     Status: None   Collection Time: 09/24/17  1:22 AM  Result Value Ref Range Status   Specimen Description URINE, RANDOM  Final   Special Requests STERILE CONTAINER SENT  Final   Culture   Final    NO GROWTH Performed at [facility], [place] [address] [place], [place] [address]    Report Status 09/25/2017 FINAL  Final  Body fluid culture     Status: None (Preliminary result)   Collection Time: 09/24/17 12:00 PM  Result Value Ref Range Status   Specimen Description PERITONEAL CAVITY  Final   Special Requests NONE  Final   Gram Stain   Final    RARE WBC PRESENT,BOTH PMN AND MONONUCLEAR NO ORGANISMS SEEN    Culture   Final    NO GROWTH 2 DAYS Performed at [facility], [place] [address], [place], [place] [address]    Report Status PENDING  Incomplete     Terri Piedra, [place] [facility] [phone] Pager  09/26/2017  1:01 PM

## 2017-09-26 NOTE — Progress Notes (Signed)
PROGRESS NOTE  Derek Lloyd GGE:[id number] DOB: December 13, 1970 DOA: 09/23/2017 PCP: Patient, [name]  Brief narrative  Derek Lloyd is a 47 y.o. year old male with  medical history significant for chronic ongoing abdominal pain for the past year, recent admission (08/15/2017) for abdominal pain and vomiting with CT abdomen showing partial SBO and concern for terminal ileitis started on IV solumedrol by GI due to high suspicion of Crohn's disease but he left AMA refusing NG tube placement for partial SBO, 6 months prior to that admission he also left AMA during treatment of abdominal abscess who presented on 09/23/2017 with recurrentrecurrent abdominal pain and was found to have moderate ascites on CT abdomen with distal thickening of ileum concerning for IBD. Patient has been hemodynamically stable with unremarkable lab work ( wnl LFTs, CBC, lipase, UA, troponin)  Interval History No acute events overnight   ROS: Positive abdominal pain, nausea.  No fevers, chills or chest pain    Subjective Continues to have abdominal pain.  Also complains of nausea but no vomiting.  No bowel movement.  Is concerned that his school has labeled him as incomplete him for unable to attend the exam  Assessment/Plan: Active Problems:   Abdominal pain   Ascites   1. Diffuse abdominal pain in patient with distal ileum thickening.  Concern for terminal ileitis on CT abdomen likely represents Crohn's disease. Patient with abdominal pain, at least 4 loose stool daily with episodes of bowel incontinence, reports blood in stool chronically as well.  There was concern for Crohn's on his last admission before he left AMA.   Will consult GI for further recommendations.  All other abdominal work-up has been negative (within normal limits lipase, UA, LFTs, CBC) patient remains afebrile.  Without constipation or diarrhea. IV pain control, bowel regimen. Added PRN oral percocet for better long acting pain for moderate pain with  continued Prn IV dilaudid for severe pain  2. Ascites with concern for secondary bacterial peritonitis.  Switched to IV Zosyn on 5/7 ( from empiric Ceftriaxone) as fluid analysis with > 6000 WBC ( 31% neutrophils) consistent with secondary bacterial peritonitis. S/p 1.5 L hazy fluid from paracentesis on 5/7. No known hx of cirrhosis and denies current alcohol abuse . Area of nodular pattern to the liver on CT abdomen/ f/u hepatitis panel, peritoneal culture and cytology. Appreciate GI recommendations ID consulted.  Repeat CT scan as well as paracentesis requested by GI.  Appreciate both their input.   Code Status: Full  Family Communication: no family at bedside  Disposition Plan: pending peritoneal fluid culture, GI recommendations, f/u hepatitis panel, IV zosyn, monitor abdominal pain,   Consultants:  GI ID  Procedures:  Paracentesis 5/7- 1.5 L hazy, amber fluid removed  Antimicrobials:  Ceftriaxone 5/7  Zosyn 5/7--  Cultures:  Urine, 5/7, pending  Peritoneal fluid culture, pending  Telemetry: no  DVT prophylaxis: SCDs   Objective: Vitals:   09/25/17 2244 09/26/17 0616 09/26/17 0626 09/26/17 1449  BP: 111/74  117/74 103/71  Pulse: 87  88 79  Resp:   18 19  Temp:    98 F (36.7 C)  TempSrc:    Oral  SpO2: 98%   97%  Weight:  56.8 kg (125 lb 3.5 oz)    Height:        Intake/Output Summary (Last 24 hours) at 09/26/2017 1751 Last data filed at 09/26/2017 1449 Gross per 24 hour  Intake 760 ml  Output -  Net 760 ml  Filed Weights   09/24/17 0830 09/26/17 0616  Weight: 57.8 kg (127 lb 6.8 oz) 56.8 kg (125 lb 3.5 oz)    Exam:  Constitutional: male who looks much older than stated age, in no distress Eyes: EOMI, anicteric ENMT: Oropharynx with moist mucous membranes, poor dentition Cardiovascular: No peripheral edema Respiratory: Normal respiratory effort on room air,  Abdomen: Soft, slightly distended, tenderness greatest with palpation in lower quadrants,  guarding, no rebound tenderness, normal bowel sounds  Neurologic: Grossly no focal neuro deficit. Psychiatric:Appropriate affect. Irritable mood. Mental status AAOx3  Data Reviewed: CBC: Recent Labs  Lab 09/23/17 1236 09/24/17 0417 09/25/17 0355  WBC 9.9 9.2 9.2  NEUTROABS  --  6.2  --   HGB 15.6 14.4 14.3  HCT 45.1 42.8 42.6  MCV 95.3 96.4 94.5  PLT 390 370 379   Basic Metabolic Panel: Recent Labs  Lab 09/23/17 1236 09/24/17 0417 09/25/17 0355  NA 139 139 136  K 3.8 3.7 3.7  CL 103 104 100*  CO2 25 25 27   GLUCOSE 90 82 97  BUN 7 9 6   CREATININE 0.79 0.87 0.80  CALCIUM 9.1 8.7* 8.6*   GFR: Estimated Creatinine Clearance: 93.7 mL/min (by C-G formula based on SCr of 0.8 mg/dL). Liver Function Tests: Recent Labs  Lab 09/23/17 1236 09/24/17 0417 09/25/17 0355  AST 19 15 17   ALT 18 14* 14*  ALKPHOS 115 107 113  BILITOT 0.7 1.2 1.0  PROT 7.0 6.1* 6.1*  ALBUMIN 3.3* 3.0* 2.9*   Recent Labs  Lab 09/23/17 1236  LIPASE 22   No results for input(s): AMMONIA in the last 168 hours. Coagulation Profile: Recent Labs  Lab 09/25/17 0355  INR 1.08   Cardiac Enzymes: No results for input(s): CKTOTAL, CKMB, CKMBINDEX, TROPONINI in the last 168 hours. BNP (last 3 results) No results for input(s): PROBNP in the last 8760 hours. HbA1C: No results for input(s): HGBA1C in the last 72 hours. CBG: Recent Labs  Lab 09/25/17 0803 09/25/17 1637 09/25/17 2344 09/26/17 0759 09/26/17 1634  GLUCAP 89 81 87 96 75   Lipid Profile: No results for input(s): CHOL, HDL, LDLCALC, TRIG, CHOLHDL, LDLDIRECT in the last 72 hours. Thyroid Function Tests: No results for input(s): TSH, T4TOTAL, FREET4, T3FREE, THYROIDAB in the last 72 hours. Anemia Panel: No results for input(s): VITAMINB12, FOLATE, FERRITIN, TIBC, IRON, RETICCTPCT in the last 72 hours. Urine analysis:    Component Value Date/Time   COLORURINE YELLOW 09/24/2017 0122   APPEARANCEUR CLEAR 09/24/2017 0122    LABSPEC >1.046 (H) 09/24/2017 0122   PHURINE 5.0 09/24/2017 0122   GLUCOSEU NEGATIVE 09/24/2017 0122   HGBUR NEGATIVE 09/24/2017 0122   BILIRUBINUR NEGATIVE 09/24/2017 0122   KETONESUR 20 (A) 09/24/2017 0122   PROTEINUR NEGATIVE 09/24/2017 0122   UROBILINOGEN 1.0 11/13/2014 1800   NITRITE NEGATIVE 09/24/2017 0122   LEUKOCYTESUR NEGATIVE 09/24/2017 0122   Sepsis Labs: @LABRCNTIP (procalcitonin:4,lacticidven:4)  ) Recent Results (from the past 240 hour(s))  Urine culture     Status: None   Collection Time: 09/24/17  1:22 AM  Result Value Ref Range Status   Specimen Description URINE, RANDOM  Final   Special Requests STERILE CONTAINER SENT  Final   Culture   Final    NO GROWTH Performed at [facility], [place] [address], [place], [place] [address]    Report Status 09/25/2017 FINAL  Final  Body fluid culture     Status: None (Preliminary result)   Collection Time: 09/24/17 12:00 PM  Result  Value Ref Range Status   Specimen Description PERITONEAL CAVITY  Final   Special Requests NONE  Final   Gram Stain   Final    RARE WBC PRESENT,BOTH PMN AND MONONUCLEAR NO ORGANISMS SEEN    Culture   Final    NO GROWTH 2 DAYS Performed at [facility], [address], [place], [place] [address]    Report Status PENDING  Incomplete      Studies: No results found.  Scheduled Meds:  Continuous Infusions: . cefTRIAXone (ROCEPHIN)  IV Stopped (09/26/17 1415)  . metronidazole Stopped (09/26/17 1310)     LOS: 2 days     Berle Mull, MD [facility]  If 7PM-7AM, please contact night-coverage [email or web address] Password [email or web address] 09/26/2017, 5:51 PM

## 2017-09-26 NOTE — Progress Notes (Addendum)
[facility]enterology Progress Note  Derek Lloyd 47 y.o. 1971/01/30  CC:  Abdominal pain, possible Crohn's disease.   Subjective: itching and rash resolved.  Continues to have abdominal pain now localized  at left upper quadrant and RLQ.afebrile. Denies nausea vomiting. Had a bowel movement this morning.  ROS : negative for chest pain shortness of breath. Positive for rash and itching.   Objective: Vital signs in last 24 hours: Vitals:   09/25/17 2244 09/26/17 0626  BP: 111/74 117/74  Pulse: 87 88  Resp:  18  Temp:    SpO2: 98%     Physical Exam:  General:  Alert, cooperative, no distress, appears stated age  Head:  Normocephalic, without obvious abnormality, atraumatic  Eyes:  , EOM's intact,   Lungs:   Clear to auscultation bilaterally, respirations unlabored  Heart:  Regular rate and rhythm, S1, S2 normal  Abdomen:   Soft,  abdominal distention with LUQ and RLQ  tenderness to palpation. Bowel sounds hyperactive, No peritoneal signs. Diffuse petechiae/rash noted   Extremities: Extremities normal, atraumatic, no  edema       Lab Results: Recent Labs    09/24/17 0417 09/25/17 0355  NA 139 136  K 3.7 3.7  CL 104 100*  CO2 25 27  GLUCOSE 82 97  BUN 9 6  CREATININE 0.87 0.80  CALCIUM 8.7* 8.6*   Recent Labs    09/24/17 0417 09/25/17 0355  AST 15 17  ALT 14* 14*  ALKPHOS 107 113  BILITOT 1.2 1.0  PROT 6.1* 6.1*  ALBUMIN 3.0* 2.9*   Recent Labs    09/24/17 0417 09/25/17 0355  WBC 9.2 9.2  NEUTROABS 6.2  --   HGB 14.4 14.3  HCT 42.8 42.6  MCV 96.4 94.5  PLT 370 371   Recent Labs    09/25/17 0355  LABPROT 14.0  INR 1.08      Assessment/Plan: - abdominal pain with CT scan showing thickening of the distal  Ileum.patient was admitted with similar symptoms in March 2019 and CT scan at that time showed small bowel obstruction with possible thickening of the terminal ileum Patient left AMA  - ascites. Fluid analysis positive for increased white  counts 6050 with 31 % PMNS. in the ascites fluid.CT scan finding concerning for cirrhosis. Patient has normal LFTs. Normal platelet counts. Ascites total protein count 4.4. Albumin 2.6. SAAG 0.4 . Finding concerning for secondary bacterial peritonitis. - itching and rash.most likely allergic reaction to Zosyn. Resolved now.   Recommendations ----------------------- - patient with ongoing abdominal pain. Recommend repeat diagnostic paracentesis but patient declined. Patient would like to get a repeat CT scan done for further evaluation. Repeat CT ordered. - Recommend infectious disease consult for long-term management of antibiotics in setting of spontaneous bacterial peritonitis. D/W Dr. Patel/ [facility] - Colonoscopy once acute issues/ spontaneous bacterial peritonitis resolved. -  hepatitis panel negative - GI will follow      Otis Brace MD, [place] 09/26/2017, 11:34 AM  Contact #  [phone]

## 2017-09-27 DIAGNOSIS — R109 Unspecified abdominal pain: Secondary | ICD-10-CM

## 2017-09-27 DIAGNOSIS — Z881 Allergy status to other antibiotic agents status: Secondary | ICD-10-CM

## 2017-09-27 DIAGNOSIS — R1032 Left lower quadrant pain: Secondary | ICD-10-CM

## 2017-09-27 DIAGNOSIS — R188 Other ascites: Secondary | ICD-10-CM

## 2017-09-27 LAB — COMPREHENSIVE METABOLIC PANEL
ALT: 20 U/L (ref 17–63)
ANION GAP: 12 (ref 5–15)
AST: 18 U/L (ref 15–41)
Albumin: 2.9 g/dL — ABNORMAL LOW (ref 3.5–5.0)
Alkaline Phosphatase: 174 U/L — ABNORMAL HIGH (ref 38–126)
BILIRUBIN TOTAL: 0.9 mg/dL (ref 0.3–1.2)
CHLORIDE: 98 mmol/L — AB (ref 101–111)
CO2: 27 mmol/L (ref 22–32)
Calcium: 8.7 mg/dL — ABNORMAL LOW (ref 8.9–10.3)
Creatinine, Ser: 0.93 mg/dL (ref 0.61–1.24)
GFR calc Af Amer: >60 mL/min (ref >60)
GFR calc non Af Amer: >60 mL/min (ref >60)
GLUCOSE: 88 mg/dL (ref 65–99)
POTASSIUM: 3.5 mmol/L (ref 3.5–5.1)
Sodium: 137 mmol/L (ref 135–145)
TOTAL PROTEIN: 6.4 g/dL — AB (ref 6.5–8.1)

## 2017-09-27 LAB — CBC WITH DIFFERENTIAL/PLATELET
BASOS ABS: 0 10*3/uL (ref 0.0–0.1)
Basophils Relative: 0 %
EOS ABS: 0.1 10*3/uL (ref 0.0–0.7)
EOS PCT: 2 %
HCT: 43.2 % (ref 39.0–52.0)
Hemoglobin: 14.7 g/dL (ref 13.0–17.0)
Lymphocytes Relative: 15 %
Lymphs Abs: 1.2 10*3/uL (ref 0.7–4.0)
MCH: 32.5 pg (ref 26.0–34.0)
MCHC: 34 g/dL (ref 30.0–36.0)
MCV: 95.4 fL (ref 78.0–100.0)
Monocytes Absolute: 0.9 10*3/uL (ref 0.1–1.0)
Monocytes Relative: 11 %
NEUTROS ABS: 6.2 10*3/uL (ref 1.7–7.7)
Neutrophils Relative %: 72 %
PLATELETS: 465 10*3/uL — AB (ref 150–400)
RBC: 4.53 MIL/uL (ref 4.22–5.81)
RDW: 12.4 % (ref 11.5–15.5)
WBC: 8.5 10*3/uL (ref 4.0–10.5)

## 2017-09-27 LAB — GLUCOSE, CAPILLARY
GLUCOSE-CAPILLARY: 71 mg/dL (ref 65–99)
GLUCOSE-CAPILLARY: 80 mg/dL (ref 65–99)
GLUCOSE-CAPILLARY: 81 mg/dL (ref 65–99)
Glucose-Capillary: 78 mg/dL (ref 65–99)

## 2017-09-27 LAB — MAGNESIUM: MAGNESIUM: 1.7 mg/dL (ref 1.7–2.4)

## 2017-09-27 LAB — PROTIME-INR
INR: 1.21
PROTHROMBIN TIME: 15.2 s (ref 11.4–15.2)

## 2017-09-27 LAB — LACTIC ACID, PLASMA: LACTIC ACID, VENOUS: 0.9 mmol/L (ref 0.5–1.9)

## 2017-09-27 LAB — BODY FLUID CULTURE: CULTURE: NO GROWTH

## 2017-09-27 MED ORDER — MORPHINE SULFATE (PF) 2 MG/ML IV SOLN
2.0000 mg | INTRAVENOUS | Status: DC | PRN
  Administered 2017-09-27 – 2017-09-28 (×4): 2 mg via INTRAVENOUS
  Administered 2017-09-29: 4 mg via INTRAVENOUS
  Administered 2017-09-29 – 2017-09-30 (×4): 2 mg via INTRAVENOUS
  Filled 2017-09-27 (×5): qty 1
  Filled 2017-09-27: qty 2
  Filled 2017-09-27 (×4): qty 1

## 2017-09-27 NOTE — Progress Notes (Addendum)
[facility] for Infectious Disease  Date of Admission:  09/23/2017     Total days of antibiotics 4 (ceftriaxone, flagyl)         ASSESSMENT/PLAN  Derek Lloyd is on Day 4 of antimicrobial therapy receiving ceftriaxone and metronidazole for spontaneous bacterial peritonitis. Repeat CT scan with concern for malignant ascites and mass like thickening of the sigmoid colon worrisome for colonic neoplasm. There was also matted soft tissue in the right lower quadrant possibly inflammatory or malignancy. Radiology recommend colonoscopy. He has remained afebrile with no leukocytosis. Tolerating medications with no adverse side effects.   Questionable episode of hematemesis and melena this today Tenderness better.   1. Continue ceftriaxone and metronidazole for now 2. Colonoscopy and further imaging per GI with concern for malignancy per radiology. 3. If home prior to imaging, would d/c on cipro/flagyl for 2 weeks.  Available as needed.   Active Problems:   Abdominal pain   Ascites   SUBJECTIVE:  Afebrile overnight with no leukocytosis. Repeat CT scan with concern for possible malignancy. Feels like he is being starved and worried about continued weight loss. Abdominal pain remains unchanged.    Allergies  Allergen Reactions  . Asa [Aspirin] Rash    Childhood reaction; but tolerates well now  . Piperacillin-Tazobactam In Dex Rash    Noted on May 2019 admission.  Tolerates Cephalosporins.     Review of Systems: ROS Please see HPI. All other systems reviewed and negative.    OBJECTIVE: Vitals:   09/26/17 2057 09/27/17 0302 09/27/17 0500 09/27/17 0538  BP: 108/65 112/74  119/74  Pulse: 86 80  79  Resp: 16   18  Temp:  97.6 F (36.4 C)  98.3 F (36.8 C)  TempSrc:  Oral    SpO2: 98% 99%  99%  Weight:   124 lb 9 oz (56.5 kg)   Height:       Body mass index is 17.87 kg/m.  Physical Exam  Constitutional: He appears ill. No distress.  Cardiovascular: Normal rate,  regular rhythm and normal heart sounds.  Pulmonary/Chest: Effort normal and breath sounds normal.  Abdominal: Soft. Bowel sounds are normal. There is no tenderness.    Lab Results Lab Results  Component Value Date   WBC 8.5 09/27/2017   HGB 14.7 09/27/2017   HCT 43.2 09/27/2017   MCV 95.4 09/27/2017   PLT 465 (H) 09/27/2017    Lab Results  Component Value Date   CREATININE 0.93 09/27/2017   BUN <5 (L) 09/27/2017   NA 137 09/27/2017   K 3.5 09/27/2017   CL 98 (L) 09/27/2017   CO2 27 09/27/2017    Lab Results  Component Value Date   ALT 20 09/27/2017   AST 18 09/27/2017   ALKPHOS 174 (H) 09/27/2017   BILITOT 0.9 09/27/2017     Microbiology: Recent Results (from the past 240 hour(s))  Urine culture     Status: None   Collection Time: 09/24/17  1:22 AM  Result Value Ref Range Status   Specimen Description URINE, RANDOM  Final   Special Requests STERILE CONTAINER SENT  Final   Culture   Final    NO GROWTH Performed at [facility], [place] [address] [place], [place] [address]    Report Status 09/25/2017 FINAL  Final  Body fluid culture     Status: None (Preliminary result)   Collection Time: 09/24/17 12:00 PM  Result Value Ref Range Status   Specimen Description PERITONEAL CAVITY  Final   Special Requests NONE  Final   Gram Stain   Final    RARE WBC PRESENT,BOTH PMN AND MONONUCLEAR NO ORGANISMS SEEN    Culture   Final    NO GROWTH 3 DAYS Performed at [facility], [address], [place], [place] [address]    Report Status PENDING  Incomplete     Terri Piedra, NP [place] [facility] [phone] Pager  09/27/2017  10:53 AM

## 2017-09-27 NOTE — Progress Notes (Signed)
Attempted visit around 11:30 but patient not feeling well-was standing bent over in pain and said he just felt so bad he couldn't have a visit then. Will try to check back later in case he is feeling better. 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

## 2017-09-27 NOTE — Progress Notes (Signed)
[facility] Gastroenterology Progress Note  Derek Lloyd 47 y.o. 08-May-1971  CC:  Abdominal pain, possible Crohn's disease.   Subjective:  Patient continues to have abdominal pain.Remains afebrile. Complaining of nausea and vomiting which he attributes to Flagyl. He is refusing IV Flagyl now.   Objective: Vital signs in last 24 hours: Vitals:   09/27/17 0302 09/27/17 0538  BP: 112/74 119/74  Pulse: 80 79  Resp:  18  Temp: 97.6 F (36.4 C) 98.3 F (36.8 C)  SpO2: 99% 99%    Physical Exam:  General:  Alert, cooperative, no distress, appears stated age  Head:  Normocephalic, without obvious abnormality, atraumatic  Eyes:  , EOM's intact,   Lungs:   Clear to auscultation bilaterally, respirations unlabored  Heart:  Regular rate and rhythm, S1, S2 normal  Abdomen:   Soft,  generalized abdominal tenderness to palpation with more tenderness towards left side.there is some guarding towards left lower quadrant but no rebound.bowel sounds are present.  Extremities: Extremities normal, atraumatic, no  edema       Lab Results: Recent Labs    09/25/17 0355 09/27/17 0702  NA 136 137  K 3.7 3.5  CL 100* 98*  CO2 27 27  GLUCOSE 97 88  BUN 6 <5*  CREATININE 0.80 0.93  CALCIUM 8.6* 8.7*  MG  --  1.7   Recent Labs    09/25/17 0355 09/27/17 0702  AST 17 18  ALT 14* 20  ALKPHOS 113 174*  BILITOT 1.0 0.9  PROT 6.1* 6.4*  ALBUMIN 2.9* 2.9*   Recent Labs    09/25/17 0355 09/27/17 0702  WBC 9.2 8.5  NEUTROABS  --  6.2  HGB 14.3 14.7  HCT 42.6 43.2  MCV 94.5 95.4  PLT 371 465*   Recent Labs    09/25/17 0355 09/27/17 0702  LABPROT 14.0 15.2  INR 1.08 1.21   Interim history ------------------- abdominal pain with initial CT scan showing thickening of the distal  Ileum.patient was admitted with similar symptoms in March 2019 and CT scan at that time showed small bowel obstruction with possible thickening of the terminal ileum Patient left AMA  In March 2019.     Assessment/Plan: - abdominal pain with positive ascites fluid analysis for infection (.repeat CT abdomen yesterday showed mass like thickening involving sigmoid colon which could be infectious or inflammatory but tumor cannot be excluded.CT scan on admission 09/24/2017 showed unremarkable colon. Multiple CT scan in recent past. Most likely it is s inflammatory thickening.   - ascites. Fluid analysis positive for increased white counts 6050 with 31 % PMNS. CT scan finding concerning for cirrhosis. Patient has normal LFTs. Normal platelet counts. Ascites total protein count 4.4. Albumin 2.6. SAAG 0.4 . Finding concerning for secondary bacterial peritonitis.  - itching and rash.most likely allergic reaction to Zosyn. Resolved now.   Recommendations ----------------------- - patient is agreeable for repeat paracentesis. Orders placed. Although CT scan finding is concerning for tumor-like thickening, multiple recent CT scan showed no evidence of tumor in the sigmoid colon. Cytology from recent fluid analysis negative for malignancy. - Antibiotics per ID. - if no improvement in abdominal pain, recommend surgical consultation for further evaluation. - Colonoscopy once acute issues/ spontaneous bacterial peritonitis resolved. -  hepatitis panel negative - GI will follow      Otis Brace MD, [place] 09/27/2017, 1:45 PM  Contact #  [phone]

## 2017-09-27 NOTE — Progress Notes (Signed)
PROGRESS NOTE  Derek Lloyd HEN:[id number] DOB: 12-19-1970 DOA: 09/23/2017 PCP: Patient, [name]  Brief narrative  Derek Lloyd is a 47 y.o. year old male with  medical history significant for chronic ongoing abdominal pain for the past year, recent admission (08/15/2017) for abdominal pain and vomiting with CT abdomen showing partial SBO and concern for terminal ileitis started on IV solumedrol by GI due to high suspicion of Crohn's disease but he left AMA refusing NG tube placement for partial SBO, 6 months prior to that admission he also left AMA during treatment of abdominal abscess who presented on 09/23/2017 with recurrentrecurrent abdominal pain and was found to have moderate ascites on CT abdomen with distal thickening of ileum concerning for IBD. Patient has been hemodynamically stable with unremarkable lab work ( wnl LFTs, CBC, lipase, UA, troponin)  Interval History No acute events overnight   ROS: Positive abdominal pain, nausea.  No fevers, chills or chest pain    Subjective Abdominal pain still present but does not want to use Dilaudid.  Assessment/Plan: Active Problems:   Abdominal pain   Ascites   1. Diffuse abdominal pain in patient with distal ileum thickening.  Concern for terminal ileitis on CT abdomen likely represents Crohn's disease. Patient with abdominal pain, at least 4 loose stool daily with episodes of bowel incontinence, reports blood in stool chronically as well.  There was concern for Crohn's on his last admission before he left AMA.   Will consult GI for further recommendations.  All other abdominal work-up has been negative (within normal limits lipase, UA, LFTs, CBC) patient remains afebrile.  Without constipation or diarrhea. IV pain control, bowel regimen. Added PRN oral percocet for better long acting pain for moderate pain with continued Prn IV morphine for severe pain  2. Ascites with concern for secondary bacterial peritonitis.  Switched to IV  Zosyn on 5/7 ( from empiric Ceftriaxone) as fluid analysis with > 6000 WBC ( 31% neutrophils) consistent with secondary bacterial peritonitis. S/p 1.5 L hazy fluid from paracentesis on 5/7. No known hx of cirrhosis and denies current alcohol abuse . Area of nodular pattern to the liver on CT abdomen/ f/u hepatitis panel, peritoneal culture and cytology. Appreciate GI recommendations ID consulted.  Repeat CT scan as well as paracentesis requested by GI.  Appreciate both their input. 3. No possible malignancy. Earlier CT scan for the patient only 4 days ago did not show any evidence of malignancy and last CT scan is making comments about having colonic wall thickening as well as omental caking. Along with paracentesis we will send cytology for further work-up. Patient already plan to have colonoscopy once medically stable.  Code Status: Full  Family Communication: no family at bedside  Disposition Plan: pending peritoneal fluid culture, GI recommendations, f/u hepatitis panel, IV zosyn, monitor abdominal pain,   Consultants:  GI ID  Procedures:  Paracentesis 5/7- 1.5 L hazy, amber fluid removed  Antimicrobials:  Ceftriaxone 5/7  Zosyn 5/7--  Cultures:  Urine, 5/7, pending  Peritoneal fluid culture, no growth so far  Telemetry: no  DVT prophylaxis: SCDs   Objective: Vitals:   09/27/17 0302 09/27/17 0500 09/27/17 0538 09/27/17 1446  BP: 112/74  119/74 118/69  Pulse: 80  79 89  Resp:   18 16  Temp: 97.6 F (36.4 C)  98.3 F (36.8 C) 99.1 F (37.3 C)  TempSrc: Oral   Oral  SpO2: 99%  99% 99%  Weight:  56.5 kg (124 lb 9 oz)  Height:        Intake/Output Summary (Last 24 hours) at 09/27/2017 1736 Last data filed at 09/27/2017 1449 Gross per 24 hour  Intake 780 ml  Output -  Net 780 ml   Filed Weights   09/24/17 0830 09/26/17 0616 09/27/17 0500  Weight: 57.8 kg (127 lb 6.8 oz) 56.8 kg (125 lb 3.5 oz) 56.5 kg (124 lb 9 oz)    Exam:  Constitutional: male who  looks much older than stated age, in no distress Eyes: EOMI, anicteric ENMT: Oropharynx with moist mucous membranes, poor dentition Cardiovascular: No peripheral edema Respiratory: Normal respiratory effort on room air,  Abdomen: Soft, slightly distended, tenderness greatest with palpation in lower quadrants, guarding, no rebound tenderness, normal bowel sounds  Neurologic: Grossly no focal neuro deficit. Psychiatric:Appropriate affect. Irritable mood. Mental status AAOx3  Data Reviewed: CBC: Recent Labs  Lab 09/23/17 1236 09/24/17 0417 09/25/17 0355 09/27/17 0702  WBC 9.9 9.2 9.2 8.5  NEUTROABS  --  6.2  --  6.2  HGB 15.6 14.4 14.3 14.7  HCT 45.1 42.8 42.6 43.2  MCV 95.3 96.4 94.5 95.4  PLT 390 370 371 132*   Basic Metabolic Panel: Recent Labs  Lab 09/23/17 1236 09/24/17 0417 09/25/17 0355 09/27/17 0702  NA 139 139 136 137  K 3.8 3.7 3.7 3.5  CL 103 104 100* 98*  CO2 25 25 27 27   GLUCOSE 90 82 97 88  BUN 7 9 6  <5*  CREATININE 0.79 0.87 0.80 0.93  CALCIUM 9.1 8.7* 8.6* 8.7*  MG  --   --   --  1.7   GFR: Estimated Creatinine Clearance: 80.2 mL/min (by C-G formula based on SCr of 0.93 mg/dL). Liver Function Tests: Recent Labs  Lab 09/23/17 1236 09/24/17 0417 09/25/17 0355 09/27/17 0702  AST 19 15 17 18   ALT 18 14* 14* 20  ALKPHOS 115 107 113 174*  BILITOT 0.7 1.2 1.0 0.9  PROT 7.0 6.1* 6.1* 6.4*  ALBUMIN 3.3* 3.0* 2.9* 2.9*   Recent Labs  Lab 09/23/17 1236  LIPASE 22   No results for input(s): AMMONIA in the last 168 hours. Coagulation Profile: Recent Labs  Lab 09/25/17 0355 09/27/17 0702  INR 1.08 1.21   Cardiac Enzymes: No results for input(s): CKTOTAL, CKMB, CKMBINDEX, TROPONINI in the last 168 hours. BNP (last 3 results) No results for input(s): PROBNP in the last 8760 hours. HbA1C: No results for input(s): HGBA1C in the last 72 hours. CBG: Recent Labs  Lab 09/26/17 1634 09/27/17 0045 09/27/17 0308 09/27/17 0809 09/27/17 1621    GLUCAP 75 80 78 81 71   Lipid Profile: No results for input(s): CHOL, HDL, LDLCALC, TRIG, CHOLHDL, LDLDIRECT in the last 72 hours. Thyroid Function Tests: No results for input(s): TSH, T4TOTAL, FREET4, T3FREE, THYROIDAB in the last 72 hours. Anemia Panel: No results for input(s): VITAMINB12, FOLATE, FERRITIN, TIBC, IRON, RETICCTPCT in the last 72 hours. Urine analysis:    Component Value Date/Time   COLORURINE YELLOW 09/24/2017 0122   APPEARANCEUR CLEAR 09/24/2017 0122   LABSPEC >1.046 (H) 09/24/2017 0122   PHURINE 5.0 09/24/2017 0122   GLUCOSEU NEGATIVE 09/24/2017 0122   HGBUR NEGATIVE 09/24/2017 0122   BILIRUBINUR NEGATIVE 09/24/2017 0122   KETONESUR 20 (A) 09/24/2017 0122   PROTEINUR NEGATIVE 09/24/2017 0122   UROBILINOGEN 1.0 11/13/2014 1800   NITRITE NEGATIVE 09/24/2017 0122   LEUKOCYTESUR NEGATIVE 09/24/2017 0122   Sepsis Labs: @LABRCNTIP (procalcitonin:4,lacticidven:4)  ) Recent Results (from the past 240 hour(s))  Urine culture  Status: None   Collection Time: 09/24/17  1:22 AM  Result Value Ref Range Status   Specimen Description URINE, RANDOM  Final   Special Requests STERILE CONTAINER SENT  Final   Culture   Final    NO GROWTH Performed at [facility], [address], [place], [place] [address]    Report Status 09/25/2017 FINAL  Final  Body fluid culture     Status: None   Collection Time: 09/24/17 12:00 PM  Result Value Ref Range Status   Specimen Description PERITONEAL CAVITY  Final   Special Requests NONE  Final   Gram Stain   Final    RARE WBC PRESENT,BOTH PMN AND MONONUCLEAR NO ORGANISMS SEEN    Culture   Final    NO GROWTH 3 DAYS Performed at [facility], [place] [address], [place], [place] [address]    Report Status 09/27/2017 FINAL  Final      Studies: Ct Abdomen Pelvis W Contrast  Result Date: 09/26/2017 CLINICAL DATA:  Nausea/vomiting, dizziness EXAM: CT ABDOMEN AND PELVIS WITH CONTRAST TECHNIQUE: Multidetector CT imaging  of the abdomen and pelvis was performed using the standard protocol following bolus administration of intravenous contrast. CONTRAST:  154m OMNIPAQUE IOHEXOL 300 MG/ML  SOLN COMPARISON:  09/24/2017 FINDINGS: Lower chest: Minimal dependent atelectasis bilaterally. Hepatobiliary: Liver is grossly unremarkable. Gallbladder is unremarkable. No intrahepatic or extrahepatic ductal dilatation. Pancreas: Within normal limits. Spleen: Spleen is normal in size. Adrenals/Urinary Tract: Adrenal glands are within normal limits. Kidneys are within normal limits.  No hydronephrosis. Bladder is mildly thick-walled although underdistended. Stomach/Bowel: Stomach is within normal limits. Mildly prominent but nondilated loops of small bowel in the left mid abdomen, favoring adynamic ileus given the additional findings. Appendix is not discretely visualized. Mass-like thickening involving the sigmoid colon (series 3/image 63), possibly reflecting infectious/inflammatory colitis, although worrisome for colonic neoplasm given additional findings. Additional matted soft tissue in the right lower quadrant adjacent to the cecum and terminal ileum (series 3/image 61), possibly post inflammatory, although tumor not excluded. Vascular/Lymphatic: No evidence of abdominal aortic aneurysm. Atherosclerotic calcifications of the abdominal aorta and branch vessels. No suspicious abdominopelvic lymphadenopathy. Reproductive: Prostate is grossly unremarkable. Other: Moderate abdominal ascites, loculated with enhancing rim (series 3/image 38), suspicious for malignant ascites. Peritoneal disease/omental caking in the left upper abdomen along the splenic flexure (series 3/image 27). Musculoskeletal: Visualized osseous structures are within normal limits. IMPRESSION: Malignant ascites with peritoneal disease/omental caking, as described above. Mass-like thickening involving the sigmoid colon. Additional matted soft tissue in the right lower quadrant.  While these findings may be infectious/inflammatory, tumor is not excluded. Consider colonoscopy for further evaluation. Mildly prominent loops of small bowel in the left mid abdomen, favoring adynamic ileus. Electronically Signed   By: SJulian HyM.D.   On: 09/26/2017 23:36    Scheduled Meds:  Continuous Infusions: . cefTRIAXone (ROCEPHIN)  IV Stopped (09/27/17 1438)  . metronidazole Stopped (09/27/17 1603)     LOS: 3 days     PBerle Mull MD [facility]  If 7PM-7AM, please contact night-coverage [email or web address] Password [email or web address]5/02/2018, 5:36 PM

## 2017-09-27 NOTE — Progress Notes (Signed)
Pt refusing IV antibiotics, educated the pt on the importance of the antibiotics. Pt still refused. Complains that pain medicine and antiemetic medicine makes him worse instead of better. Wants to speak to MD regarding his care plan. Posey Pronto, MD notified. Will continue to monitor pt.

## 2017-09-27 NOTE — Progress Notes (Signed)
Patient called RN to bedside and stated that he is not feeling good and requested that BP checked.  T. 97.6, BP 112/74, HR 80, R 16, and O2 99% on room air.  CBG is 78..  Will continue to monitor.

## 2017-09-27 NOTE — Progress Notes (Signed)
Pt complained that he was not feeling well and asked to check his blood pressure.  BP checked and stable.  CBG checked and it was 78.  Will continue to monitor.

## 2017-09-27 NOTE — Progress Notes (Signed)
Pt complaint of being starved.  He had a green looking diarrhea and started to complaint that nothing is being done to solve his problem. He told RN not to flush the toilet because he wants to show it to the MD so they can see they can see it. He asked RN to weigh him ane when it was done, he complained that he is losing weight.  Will continue to monitor.

## 2017-09-27 NOTE — Progress Notes (Signed)
Pt called and requested to speak with the chaplain.  Chaplain paged.  He will be at bedside in ten minutes.  Will continue to monitor.

## 2017-09-27 NOTE — Plan of Care (Signed)

## 2017-09-28 ENCOUNTER — Inpatient Hospital Stay (HOSPITAL_COMMUNITY): Payer: BLUE CROSS/BLUE SHIELD

## 2017-09-28 LAB — ALBUMIN, PLEURAL OR PERITONEAL FLUID: ALBUMIN FL: 2.1 g/dL

## 2017-09-28 LAB — PROTEIN, PLEURAL OR PERITONEAL FLUID: Total protein, fluid: 4 g/dL

## 2017-09-28 LAB — BODY FLUID CELL COUNT WITH DIFFERENTIAL
Eos, Fluid: 0 %
LYMPHS FL: 25 %
Monocyte-Macrophage-Serous Fluid: 14 % — ABNORMAL LOW (ref 50–90)
NEUTROPHIL FLUID: 61 % — AB (ref 0–25)
Other Cells, Fluid: 0 %
WBC FLUID: 2347 uL — AB (ref 0–1000)

## 2017-09-28 LAB — GLUCOSE, CAPILLARY
GLUCOSE-CAPILLARY: 103 mg/dL — AB (ref 65–99)
Glucose-Capillary: 73 mg/dL (ref 65–99)
Glucose-Capillary: 79 mg/dL (ref 65–99)

## 2017-09-28 LAB — GRAM STAIN

## 2017-09-28 MED ORDER — ZOLPIDEM TARTRATE 5 MG PO TABS
5.0000 mg | ORAL_TABLET | Freq: Every evening | ORAL | Status: DC | PRN
  Administered 2017-09-28 – 2017-09-30 (×3): 5 mg via ORAL
  Filled 2017-09-28 (×3): qty 1

## 2017-09-28 MED ORDER — WITCH HAZEL-GLYCERIN EX PADS
MEDICATED_PAD | CUTANEOUS | Status: DC | PRN
  Administered 2017-09-28: 1 via TOPICAL
  Filled 2017-09-28: qty 100

## 2017-09-28 MED ORDER — PRO-STAT SUGAR FREE PO LIQD
30.0000 mL | Freq: Two times a day (BID) | ORAL | Status: DC
  Administered 2017-09-28: 30 mL via ORAL
  Filled 2017-09-28: qty 30

## 2017-09-28 MED ORDER — HYDROCORTISONE 2.5 % RE CREA
1.0000 "application " | TOPICAL_CREAM | Freq: Four times a day (QID) | RECTAL | Status: DC | PRN
  Filled 2017-09-28 (×2): qty 28.35

## 2017-09-28 MED ORDER — LIDOCAINE HCL (PF) 1 % IJ SOLN
INTRAMUSCULAR | Status: AC
  Filled 2017-09-28: qty 30

## 2017-09-28 MED ORDER — ENSURE ENLIVE PO LIQD
237.0000 mL | Freq: Three times a day (TID) | ORAL | Status: DC
  Administered 2017-09-28 – 2017-10-02 (×6): 237 mL via ORAL

## 2017-09-28 NOTE — Progress Notes (Signed)
Subjective: Abdominal pain slowly improving. Most bothered by nausea. Having some flatus/stool.  Objective: Vital signs in last 24 hours: Temp:  [98 F (36.7 C)-99.1 F (37.3 C)] 98 F (36.7 C) (05/11 0516) Pulse Rate:  [80-89] 80 (05/11 0516) Resp:  [16-18] 18 (05/11 0516) BP: (117-122)/(69-77) 117/77 (05/11 0516) SpO2:  [99 %-100 %] 99 % (05/11 0516) Weight:  [56.4 kg (124 lb 5.4 oz)] 56.4 kg (124 lb 5.4 oz) (05/11 0500) Weight change: -0.1 kg (-3.5 oz) Last BM Date: 09/27/17  PE: GEN:  Thin and cachectic-appearing ABD:  Mild generalized tenderness, +ascites  Lab Results: CBC    Component Value Date/Time   WBC 8.5 09/27/2017 0702   RBC 4.53 09/27/2017 0702   HGB 14.7 09/27/2017 0702   HCT 43.2 09/27/2017 0702   PLT 465 (H) 09/27/2017 0702   MCV 95.4 09/27/2017 0702   MCH 32.5 09/27/2017 0702   MCHC 34.0 09/27/2017 0702   RDW 12.4 09/27/2017 0702   LYMPHSABS 1.2 09/27/2017 0702   MONOABS 0.9 09/27/2017 0702   EOSABS 0.1 09/27/2017 0702   BASOSABS 0.0 09/27/2017 0702   CMP     Component Value Date/Time   NA 137 09/27/2017 0702   K 3.5 09/27/2017 0702   CL 98 (L) 09/27/2017 0702   CO2 27 09/27/2017 0702   GLUCOSE 88 09/27/2017 0702   BUN <5 (L) 09/27/2017 0702   CREATININE 0.93 09/27/2017 0702   CALCIUM 8.7 (L) 09/27/2017 0702   PROT 6.4 (L) 09/27/2017 0702   ALBUMIN 2.9 (L) 09/27/2017 0702   AST 18 09/27/2017 0702   ALT 20 09/27/2017 0702   ALKPHOS 174 (H) 09/27/2017 0702   BILITOT 0.9 09/27/2017 0702   GFRNONAA >60 09/27/2017 0702   GFRAA >60 09/27/2017 2060    Assessment:  1.  Abnormal CT scan:  Sigmoid colon thickening, ileocecal thickening.  "mass like" on CT report, but multiple prior CTs not worrisome for malignancy, though nothing at this point can be ruled out.  Possible cirrhosis on imaging. 2.  Ascites with (?secondary) bacterial peritonitis.  Plan:  1.  Repeat paracentesis today. 2.  Suspect some nausea and metallic taste from  metronidazole, but don't see any clear alternative to metronidazole/ceftriaxone given patient's penicillin allergy. 3.  Once ascites and SBP resolves, and nausea improves, would plan for colonoscopy; suspect this won't be for a few days. 4.  Patient at high risk for leaving AMA once he feels better; will try to encourage patient to stay and complete work-up. 5.  Antiemetics, IV fluids, antibiotics. 6.  [facility] will revisit Monday.   Derek Lloyd 09/28/2017, 8:26 AM   Cell [phone] If no answer or after 5 PM call [phone]

## 2017-09-28 NOTE — Progress Notes (Signed)
PROGRESS NOTE  Derek Lloyd KGM:[id number] DOB: [birth year] DOA: 09/23/2017 PCP: Derek Lloyd, [name]  Brief narrative  Derek Lloyd is a 47 y.o. year old male with  medical history significant for chronic ongoing abdominal pain for the past year, recent admission (08/15/2017) for abdominal pain and vomiting with CT abdomen showing partial SBO and concern for terminal ileitis started on IV solumedrol by GI due to high suspicion of Crohn's disease but he left AMA refusing NG tube placement for partial SBO, 6 months prior to that admission he also left AMA during treatment of abdominal abscess who presented on 09/23/2017 with recurrentrecurrent abdominal pain and was found to have moderate ascites on CT abdomen with distal thickening of ileum concerning for IBD. Derek Lloyd has been hemodynamically stable with unremarkable lab work ( wnl LFTs, CBC, lipase, UA, troponin)  Interval History No acute events overnight   ROS: Positive abdominal pain, nausea.  No fevers, chills or chest pain    Subjective Feeling better, pain well controlled.  No nausea no vomiting.  Passing gas and green bowel movement.  Assessment/Plan: Active Problems:   Abdominal pain   Ascites   1. Diffuse abdominal pain in Derek Lloyd with distal ileum thickening. Concern for terminal ileitis on CT abdomen likely Crohn's disease. Sigmoid colon thickening concerning for masslike appearance There was concern for Crohn's on his last admission before he left AMA. normal limits lipase, UA, LFTs, CBC) Derek Lloyd remains afebrile. GI consulted, Derek Lloyd underwent paracentesis x2 so far. Once Derek Lloyd's nausea improves GI planning colonoscopy for further work-up. Pain medication change from Dilaudid to morphine for now we will monitor. Since pain is better I would advance diet from clear liquid to full liquid.  2. Ascites with concern for secondary bacterial peritonitis. S/P paracentesis x2. Initial paracentesis showed 6,000  WBC. Switched to IV Zosyn on 5/7, developed reaction and change back to ceftriaxone Flagyl. No known hx of cirrhosis and denies current alcohol abuse. ID consulted recommend continue current antibiotic. Repeat CT scan shows possible evidence of malignant ascites as well. We will monitor the results of the cytology sent out on the repeat paracentesis and if negative will send the Derek Lloyd for omental biopsy.  3. possible malignancy. Earlier CT scan for the Derek Lloyd only 4 days ago did not show any evidence of malignancy and last CT scan is making comments about having colonic wall thickening as well as omental caking. Along with paracentesis we will send cytology for further work-up. Derek Lloyd already plan to have colonoscopy once medically stable.  Code Status: Full  Family Communication: no family at bedside  Disposition Plan: pending peritoneal fluid culture, GI recommendations,  IV antibiotic, monitor abdominal pain,   Consultants:  GI ID  Procedures:  Paracentesis 5/7- 1.5 L hazy, amber fluid removed  Cultures:  Urine, 5/7, no growth  Peritoneal fluid culture, no growth so far  DVT prophylaxis: SCDs   Objective: Vitals:   09/28/17 0516 09/28/17 0955 09/28/17 1020 09/28/17 1518  BP: 117/77 113/76 118/75 121/81  Pulse: 80   75  Resp: 18   18  Temp: 98 F (36.7 C)   98.1 F (36.7 C)  TempSrc:    Oral  SpO2: 99%   99%  Weight:      Height:       No intake or output data in the 24 hours ending 09/28/17 1616 Filed Weights   09/26/17 0616 09/27/17 0500 09/28/17 0500  Weight: 56.8 kg (125 lb 3.5 oz) 56.5 kg (124 lb 9 oz) 56.4  kg (124 lb 5.4 oz)    Exam:  Constitutional: male who looks much older than stated age, in no distress Eyes: EOMI, anicteric ENMT: Oropharynx with moist mucous membranes, poor dentition Cardiovascular: No peripheral edema Respiratory: Normal respiratory effort on room air,  Abdomen: Soft, slightly distended, tenderness greatest with  palpation in lower quadrants, guarding, no rebound tenderness, normal bowel sounds  Neurologic: Grossly no focal neuro deficit. Psychiatric:Appropriate affect. Irritable mood. Mental status AAOx3  Data Reviewed: CBC: Recent Labs  Lab 09/23/17 1236 09/24/17 0417 09/25/17 0355 09/27/17 0702  WBC 9.9 9.2 9.2 8.5  NEUTROABS  --  6.2  --  6.2  HGB 15.6 14.4 14.3 14.7  HCT 45.1 42.8 42.6 43.2  MCV 95.3 96.4 94.5 95.4  PLT 390 370 371 161*   Basic Metabolic Panel: Recent Labs  Lab 09/23/17 1236 09/24/17 0417 09/25/17 0355 09/27/17 0702  NA 139 139 136 137  K 3.8 3.7 3.7 3.5  CL 103 104 100* 98*  CO2 25 25 27 27   GLUCOSE 90 82 97 88  BUN 7 9 6  <5*  CREATININE 0.79 0.87 0.80 0.93  CALCIUM 9.1 8.7* 8.6* 8.7*  MG  --   --   --  1.7   GFR: Estimated Creatinine Clearance: 80 mL/min (by C-G formula based on SCr of 0.93 mg/dL). Liver Function Tests: Recent Labs  Lab 09/23/17 1236 09/24/17 0417 09/25/17 0355 09/27/17 0702  AST 19 15 17 18   ALT 18 14* 14* 20  ALKPHOS 115 107 113 174*  BILITOT 0.7 1.2 1.0 0.9  PROT 7.0 6.1* 6.1* 6.4*  ALBUMIN 3.3* 3.0* 2.9* 2.9*   Recent Labs  Lab 09/23/17 1236  LIPASE 22   No results for input(s): AMMONIA in the last 168 hours. Coagulation Profile: Recent Labs  Lab 09/25/17 0355 09/27/17 0702  INR 1.08 1.21   Cardiac Enzymes: No results for input(s): CKTOTAL, CKMB, CKMBINDEX, TROPONINI in the last 168 hours. BNP (last 3 results) No results for input(s): PROBNP in the last 8760 hours. HbA1C: No results for input(s): HGBA1C in the last 72 hours. CBG: Recent Labs  Lab 09/27/17 0308 09/27/17 0809 09/27/17 1621 09/28/17 0514 09/28/17 0825  GLUCAP 78 81 71 73 79   Lipid Profile: No results for input(s): CHOL, HDL, LDLCALC, TRIG, CHOLHDL, LDLDIRECT in the last 72 hours. Thyroid Function Tests: No results for input(s): TSH, T4TOTAL, FREET4, T3FREE, THYROIDAB in the last 72 hours. Anemia Panel: No results for input(s):  VITAMINB12, FOLATE, FERRITIN, TIBC, IRON, RETICCTPCT in the last 72 hours. Urine analysis:    Component Value Date/Time   COLORURINE YELLOW 09/24/2017 0122   APPEARANCEUR CLEAR 09/24/2017 0122   LABSPEC >1.046 (H) 09/24/2017 0122   PHURINE 5.0 09/24/2017 0122   GLUCOSEU NEGATIVE 09/24/2017 0122   HGBUR NEGATIVE 09/24/2017 0122   BILIRUBINUR NEGATIVE 09/24/2017 0122   KETONESUR 20 (A) 09/24/2017 0122   PROTEINUR NEGATIVE 09/24/2017 0122   UROBILINOGEN 1.0 11/13/2014 1800   NITRITE NEGATIVE 09/24/2017 0122   LEUKOCYTESUR NEGATIVE 09/24/2017 0122   Sepsis Labs: @LABRCNTIP (procalcitonin:4,lacticidven:4)  ) Recent Results (from the past 240 hour(s))  Urine culture     Status: None   Collection Time: 09/24/17  1:22 AM  Result Value Ref Range Status   Specimen Description URINE, RANDOM  Final   Special Requests STERILE CONTAINER SENT  Final   Culture   Final    NO GROWTH Performed at [facility], [place] [address], [place], [place] [address]    Report Status 09/25/2017  FINAL  Final  Body fluid culture     Status: None   Collection Time: 09/24/17 12:00 PM  Result Value Ref Range Status   Specimen Description PERITONEAL CAVITY  Final   Special Requests NONE  Final   Gram Stain   Final    RARE WBC PRESENT,BOTH PMN AND MONONUCLEAR NO ORGANISMS SEEN    Culture   Final    NO GROWTH 3 DAYS Performed at [facility], [address] [place], [place] [address]    Report Status 09/27/2017 FINAL  Final  Gram stain     Status: None   Collection Time: 09/28/17 10:36 AM  Result Value Ref Range Status   Specimen Description PERITONEAL  Final   Special Requests NONE  Final   Gram Stain   Final    CYTOSPIN SMEAR WBC PRESENT, PREDOMINANTLY PMN NO ORGANISMS SEEN Performed at [facility], [place] [address] [place], [place] [address]    Report Status 09/28/2017 FINAL  Final      Studies: US Paracentesis  Result Date: 09/28/2017 INDICATION: Ascites EXAM:  ULTRASOUND-GUIDED PARACENTESIS COMPARISON:  Previous paracentesis. MEDICATIONS: 10 cc 1% lidocaine. COMPLICATIONS: None immediate. TECHNIQUE: Informed written consent was obtained from the Derek Lloyd after a discussion of the risks, benefits and alternatives to treatment. A timeout was performed prior to the initiation of the procedure. Initial ultrasound scanning demonstrates a large amount of ascites within the right lower abdominal quadrant. The right lower abdomen was prepped and draped in the usual sterile fashion. 1% lidocaine with epinephrine was used for local anesthesia. Under direct ultrasound guidance, a 19 gauge, 7-cm, Yueh catheter was introduced. An ultrasound image was saved for documentation purposed. The paracentesis was performed. The catheter was removed and a dressing was applied. The Derek Lloyd tolerated the procedure well without immediate post procedural complication. FINDINGS: A total of approximately 1.1 liters of yellow fluid was removed. Samples were sent to the laboratory as requested by the clinical team. IMPRESSION: Successful ultrasound-guided paracentesis yielding 1.1 liters of peritoneal fluid. Read by Lavonia Drafts [facility] Electronically Signed   By: Corrie Mckusick D.O.   On: 09/28/2017 13:41    Scheduled Meds:  Continuous Infusions: . cefTRIAXone (ROCEPHIN)  IV 2 g (09/28/17 1506)  . metronidazole Stopped (09/28/17 1417)     LOS: 4 days     Berle Mull, MD [facility]  If 7PM-7AM, please contact night-coverage [email or web address] Password [email or web address] 09/28/2017, 4:16 PM

## 2017-09-28 NOTE — Procedures (Signed)
   RLQ US guided paracentesis  1.1 L yellow fluid Sent for labs  Tolerated well

## 2017-09-29 LAB — GLUCOSE, CAPILLARY
GLUCOSE-CAPILLARY: 125 mg/dL — AB (ref 65–99)
Glucose-Capillary: 87 mg/dL (ref 65–99)
Glucose-Capillary: 92 mg/dL (ref 65–99)

## 2017-09-29 LAB — ACID FAST SMEAR (AFB): ACID FAST SMEAR - AFSCU2: NEGATIVE

## 2017-09-29 MED ORDER — ACETAMINOPHEN 325 MG PO TABS: 650.0000 mg | ORAL_TABLET | Freq: Four times a day (QID) | ORAL | Status: DC | PRN

## 2017-09-29 NOTE — Progress Notes (Signed)
PROGRESS NOTE  Delfin Squillace OJJ:[id number] DOB: Nov 09, 1970 DOA: 09/23/2017 PCP: Patient, [name]  Brief narrative  Reginold Beale is a 47 y.o. year old male with  medical history significant for chronic ongoing abdominal pain for the past year, recent admission (08/15/2017) for abdominal pain and vomiting with CT abdomen showing partial SBO and concern for terminal ileitis started on IV solumedrol by GI due to high suspicion of Crohn's disease but he left AMA refusing NG tube placement for partial SBO, 6 months prior to that admission he also left AMA during treatment of abdominal abscess who presented on 09/23/2017 with recurrentrecurrent abdominal pain and was found to have moderate ascites on CT abdomen with distal thickening of ileum concerning for IBD. Patient has been hemodynamically stable with unremarkable lab work ( wnl LFTs, CBC, lipase, UA, troponin)  Interval History No acute events overnight   ROS: Positive abdominal pain, nausea.  No fevers, chills or chest pain    Subjective Patient was ablating in the hallway, multiple rounds.  Still has loose watery BM.  Still continues to complain of abdominal pain.  No nausea or vomiting at the time of my evaluation but reports that having continuous nausea and had an episode of vomiting last night.  Assessment/Plan: Active Problems:   Abdominal pain   Ascites   1. Diffuse abdominal pain in patient with distal ileum thickening. Concern for terminal ileitis on CT abdomen likely Crohn's disease. Sigmoid colon thickening concerning for masslike appearance There was concern for Crohn's on his last admission before he left AMA. normal limits lipase, UA, LFTs, CBC) patient remains afebrile. GI consulted, patient underwent paracentesis x2 so far. Once patient's nausea improves GI planning colonoscopy for further work-up. Pain medication change from Dilaudid to morphine for now we will monitor. Since pain is better I would advance diet  soft diet  2. Ascites with concern for secondary bacterial peritonitis. S/P paracentesis x2. Initial paracentesis showed 6,000 WBC. Switched to IV Zosyn on 5/7, developed reaction and change back to ceftriaxone Flagyl. No known hx of cirrhosis and denies current alcohol abuse. ID consulted recommend continue current antibiotic. Repeat CT scan shows possible evidence of malignant ascites as well. We will monitor the results of the cytology sent out on the repeat paracentesis and if negative will send the patient for omental biopsy.  3. possible malignancy. Earlier CT scan for the patient only 4 days ago did not show any evidence of malignancy and last CT scan is making comments about having colonic wall thickening as well as omental caking. Along with paracentesis we will send cytology for further work-up. Patient already plan to have colonoscopy once medically stable.  Code Status: Full  Family Communication: no family at bedside  Disposition Plan: pending peritoneal fluid culture, GI recommendations,  IV antibiotic, monitor abdominal pain,   Consultants:  GI ID  Procedures:  Paracentesis 5/7- 1.5 L hazy, amber fluid removed  Cultures:  Urine, 5/7, no growth  Peritoneal fluid culture, no growth so far  DVT prophylaxis: SCDs   Objective: Vitals:   09/28/17 2110 09/29/17 0500 09/29/17 0619 09/29/17 1420  BP: 121/72  124/84 129/87  Pulse: 75  89 90  Resp: 19  17 18   Temp: 98.7 F (37.1 C)  98.3 F (36.8 C) 98.5 F (36.9 C)  TempSrc: Oral  Oral Oral  SpO2: 100%  100% 97%  Weight:  54.5 kg (120 lb 2.4 oz)    Height:        Intake/Output Summary (  Last 24 hours) at 09/29/2017 1546 Last data filed at 09/28/2017 1830 Gross per 24 hour  Intake -  Output 325 ml  Net -325 ml   Filed Weights   09/27/17 0500 09/28/17 0500 09/29/17 0500  Weight: 56.5 kg (124 lb 9 oz) 56.4 kg (124 lb 5.4 oz) 54.5 kg (120 lb 2.4 oz)    Exam:  Constitutional: male who looks much  older than stated age, in no distress Eyes: EOMI, anicteric ENMT: Oropharynx with moist mucous membranes, poor dentition Cardiovascular: No peripheral edema Respiratory: Normal respiratory effort on room air,  Abdomen: Soft, slightly distended, tenderness greatest with palpation in lower quadrants, guarding, no rebound tenderness, normal bowel sounds  Neurologic: Grossly no focal neuro deficit. Psychiatric:Appropriate affect. Irritable mood. Mental status AAOx3  Data Reviewed: CBC: Recent Labs  Lab 09/23/17 1236 09/24/17 0417 09/25/17 0355 09/27/17 0702  WBC 9.9 9.2 9.2 8.5  NEUTROABS  --  6.2  --  6.2  HGB 15.6 14.4 14.3 14.7  HCT 45.1 42.8 42.6 43.2  MCV 95.3 96.4 94.5 95.4  PLT 390 370 371 161*   Basic Metabolic Panel: Recent Labs  Lab 09/23/17 1236 09/24/17 0417 09/25/17 0355 09/27/17 0702  NA 139 139 136 137  K 3.8 3.7 3.7 3.5  CL 103 104 100* 98*  CO2 25 25 27 27   GLUCOSE 90 82 97 88  BUN 7 9 6  <5*  CREATININE 0.79 0.87 0.80 0.93  CALCIUM 9.1 8.7* 8.6* 8.7*  MG  --   --   --  1.7   GFR: Estimated Creatinine Clearance: 77.3 mL/min (by C-G formula based on SCr of 0.93 mg/dL). Liver Function Tests: Recent Labs  Lab 09/23/17 1236 09/24/17 0417 09/25/17 0355 09/27/17 0702  AST 19 15 17 18   ALT 18 14* 14* 20  ALKPHOS 115 107 113 174*  BILITOT 0.7 1.2 1.0 0.9  PROT 7.0 6.1* 6.1* 6.4*  ALBUMIN 3.3* 3.0* 2.9* 2.9*   Recent Labs  Lab 09/23/17 1236  LIPASE 22   No results for input(s): AMMONIA in the last 168 hours. Coagulation Profile: Recent Labs  Lab 09/25/17 0355 09/27/17 0702  INR 1.08 1.21   Cardiac Enzymes: No results for input(s): CKTOTAL, CKMB, CKMBINDEX, TROPONINI in the last 168 hours. BNP (last 3 results) No results for input(s): PROBNP in the last 8760 hours. HbA1C: No results for input(s): HGBA1C in the last 72 hours. CBG: Recent Labs  Lab 09/28/17 0514 09/28/17 0825 09/28/17 1742 09/28/17 2358 09/29/17 0822  GLUCAP 73 79  103* 125* 87   Lipid Profile: No results for input(s): CHOL, HDL, LDLCALC, TRIG, CHOLHDL, LDLDIRECT in the last 72 hours. Thyroid Function Tests: No results for input(s): TSH, T4TOTAL, FREET4, T3FREE, THYROIDAB in the last 72 hours. Anemia Panel: No results for input(s): VITAMINB12, FOLATE, FERRITIN, TIBC, IRON, RETICCTPCT in the last 72 hours. Urine analysis:    Component Value Date/Time   COLORURINE YELLOW 09/24/2017 0122   APPEARANCEUR CLEAR 09/24/2017 0122   LABSPEC >1.046 (H) 09/24/2017 0122   PHURINE 5.0 09/24/2017 0122   GLUCOSEU NEGATIVE 09/24/2017 0122   HGBUR NEGATIVE 09/24/2017 0122   BILIRUBINUR NEGATIVE 09/24/2017 0122   KETONESUR 20 (A) 09/24/2017 0122   PROTEINUR NEGATIVE 09/24/2017 0122   UROBILINOGEN 1.0 11/13/2014 1800   NITRITE NEGATIVE 09/24/2017 0122   LEUKOCYTESUR NEGATIVE 09/24/2017 0122   Sepsis Labs: @LABRCNTIP (procalcitonin:4,lacticidven:4)  ) Recent Results (from the past 240 hour(s))  Urine culture     Status: None   Collection Time: 09/24/17  1:22  AM  Result Value Ref Range Status   Specimen Description URINE, RANDOM  Final   Special Requests STERILE CONTAINER SENT  Final   Culture   Final    NO GROWTH Performed at [facility], [address], [place], [place] [address]    Report Status 09/25/2017 FINAL  Final  Body fluid culture     Status: None   Collection Time: 09/24/17 12:00 PM  Result Value Ref Range Status   Specimen Description PERITONEAL CAVITY  Final   Special Requests NONE  Final   Gram Stain   Final    RARE WBC PRESENT,BOTH PMN AND MONONUCLEAR NO ORGANISMS SEEN    Culture   Final    NO GROWTH 3 DAYS Performed at [facility], [place] [address], [place], [place] [address]    Report Status 09/27/2017 FINAL  Final  Acid Fast Smear (AFB)     Status: None   Collection Time: 09/28/17 10:36 AM  Result Value Ref Range Status   AFB Specimen Processing Concentration  Final   Acid Fast Smear Negative  Final    Comment:  (NOTE) Performed At: [facility] [place], [place] [id number] Rush Farmer MD [id number]    Source (AFB) PERITONEAL  Final    Comment: Performed at [facility], [place] [address], [place], [place] [address]  Gram stain     Status: None   Collection Time: 09/28/17 10:36 AM  Result Value Ref Range Status   Specimen Description PERITONEAL  Final   Special Requests NONE  Final   Gram Stain   Final    CYTOSPIN SMEAR WBC PRESENT, PREDOMINANTLY PMN NO ORGANISMS SEEN Performed at [facility], [place] [address] [place], [place] [address]    Report Status 09/28/2017 FINAL  Final  Culture, body fluid-bottle     Status: None (Preliminary result)   Collection Time: 09/28/17 10:36 AM  Result Value Ref Range Status   Specimen Description PERITONEAL  Final   Special Requests NONE  Final   Culture   Final    NO GROWTH < 24 HOURS Performed at [facility], [place] [address], [place],  [address]    Report Status PENDING  Incomplete      Studies: No results found.  Scheduled Meds: . feeding supplement (ENSURE ENLIVE)  237 mL Oral TID BM    Continuous Infusions: . cefTRIAXone (ROCEPHIN)  IV 2 g (09/29/17 1403)  . metronidazole Stopped (09/29/17 1344)     LOS: 5 days     Berle Mull, MD [facility]  If 7PM-7AM, please contact night-coverage [email or web address] Password [facility] 09/29/2017, 3:46 PM

## 2017-09-30 ENCOUNTER — Encounter (HOSPITAL_COMMUNITY): Payer: Self-pay | Admitting: General Surgery

## 2017-09-30 LAB — BASIC METABOLIC PANEL
ANION GAP: 7 (ref 5–15)
BUN: 7 mg/dL (ref 6–20)
CALCIUM: 8.7 mg/dL — AB (ref 8.9–10.3)
CO2: 29 mmol/L (ref 22–32)
Chloride: 103 mmol/L (ref 101–111)
Creatinine, Ser: 0.93 mg/dL (ref 0.61–1.24)
GFR calc Af Amer: >60 mL/min (ref >60)
GLUCOSE: 102 mg/dL — AB (ref 65–99)
Potassium: 4.2 mmol/L (ref 3.5–5.1)
SODIUM: 139 mmol/L (ref 135–145)

## 2017-09-30 LAB — CBC
HCT: 44.9 % (ref 39.0–52.0)
Hemoglobin: 14.8 g/dL (ref 13.0–17.0)
MCH: 31.4 pg (ref 26.0–34.0)
MCHC: 33 g/dL (ref 30.0–36.0)
MCV: 95.1 fL (ref 78.0–100.0)
PLATELETS: 501 10*3/uL — AB (ref 150–400)
RBC: 4.72 MIL/uL (ref 4.22–5.81)
RDW: 12.3 % (ref 11.5–15.5)
WBC: 9.8 10*3/uL (ref 4.0–10.5)

## 2017-09-30 LAB — GLUCOSE, CAPILLARY
Glucose-Capillary: 106 mg/dL — ABNORMAL HIGH (ref 65–99)
Glucose-Capillary: 138 mg/dL — ABNORMAL HIGH (ref 65–99)
Glucose-Capillary: 96 mg/dL (ref 65–99)

## 2017-09-30 LAB — LACTIC ACID, PLASMA: LACTIC ACID, VENOUS: 0.5 mmol/L (ref 0.5–1.9)

## 2017-09-30 NOTE — Progress Notes (Signed)
Recds reviewed.  Pt feeling somewhat better.  Tolerating clear liquids---no nausea.  Still fairly exquisitely tender in RLQ, but w/out overt rebound.  Afebrile, nl WBC on Rocephin.  Peritoneal fluid cytology NEGATIVE.   IMPR:  1. Multiple radiographic intra-abd abnl's--fluid w/ high white count, ileitis/soft tissue abnl's in RLQ, omental caking, sigmoid thickening  2. H/o medical non-compliance   RECOMM:  1. Surgical consultation.  At their discretion, if they feel colonoscopy is indicated, pt could probably tolerate the prep.   I think the main issue for this pt's mgt is whether non-operative evaluation, such as colonoscopy and IR bx's, is appropriate, or whether pt may best be managed surgically (doesn't appear to be a good candidate for medical therapy if this is Crohn's, given his h/o non-compliance which he attributes to being in school if this is malignancy, he obviously would need surgery).    Cleotis Nipper, M.D. Pager [phone] If no answer or after 5 PM call [phone]

## 2017-09-30 NOTE — H&P (View-Only) (Signed)
Recds reviewed.  Pt feeling somewhat better.  Tolerating clear liquids---no nausea.  Still fairly exquisitely tender in RLQ, but w/out overt rebound.  Afebrile, nl WBC on Rocephin.  Peritoneal fluid cytology NEGATIVE.   IMPR:  1. Multiple radiographic intra-abd abnl's--fluid w/ high white count, ileitis/soft tissue abnl's in RLQ, omental caking, sigmoid thickening  2. H/o medical non-compliance   RECOMM:  1. Surgical consultation.  At their discretion, if they feel colonoscopy is indicated, pt could probably tolerate the prep.   I think the main issue for this pt's mgt is whether non-operative evaluation, such as colonoscopy and IR bx's, is appropriate, or whether pt may best be managed surgically (doesn't appear to be a good candidate for medical therapy if this is Crohn's, given his h/o non-compliance which he attributes to being in school if this is malignancy, he obviously would need surgery).    Cleotis Nipper, M.D. Pager [phone] If no answer or after 5 PM call [phone]

## 2017-09-30 NOTE — Progress Notes (Signed)
PROGRESS NOTE  Derek Lloyd OQH:[id number] DOB: 04/24/1971 DOA: 09/23/2017 PCP: Patient, [name]  Brief narrative  Derek Lloyd is a 47 y.o. year old male with  medical history significant for chronic ongoing abdominal pain for the past year, recent admission (08/15/2017) for abdominal pain and vomiting with CT abdomen showing partial SBO and concern for terminal ileitis started on IV solumedrol by GI due to high suspicion of Crohn's disease but he left AMA refusing NG tube placement for partial SBO, 6 months prior to that admission he also left AMA during treatment of abdominal abscess who presented on 09/23/2017 with recurrentrecurrent abdominal pain and was found to have moderate ascites on CT abdomen with distal thickening of ileum concerning for IBD. Patient has been hemodynamically stable with unremarkable lab work ( wnl LFTs, CBC, lipase, UA, troponin)  Interval History No acute events overnight   ROS: Positive abdominal pain, nausea.  No fevers, chills or chest pain    Subjective No acute events overnight.  Ambulating.  Oral appetite obese better.  Bowel movements are better as well.  Assessment/Plan: Active Problems:   Abdominal pain   Ascites   1. Diffuse abdominal pain in patient with distal ileum thickening. Concern for terminal ileitis on CT abdomen likely Crohn's disease. Sigmoid colon thickening concerning for masslike appearance There was concern for Crohn's on his last admission before he left AMA. normal limits lipase, UA, LFTs, CBC) patient remains afebrile. GI consulted, patient underwent paracentesis x2 so far. Once patient's nausea improves GI planning colonoscopy for further work-up. Continue IV pain medication continue only oral pain medications. Since pain is better I would advance diet soft diet GI consulted surgery, surgery recommends conservative management and colonoscopy.  Will follow further recommendation.  2. Ascites with concern for secondary  bacterial peritonitis. S/P paracentesis x2. Initial paracentesis showed 6,000 WBC. Switched to IV Zosyn on 5/7, developed reaction and change back to ceftriaxone Flagyl. No known hx of cirrhosis and denies current alcohol abuse. ID consulted recommend continue current antibiotic.  ID recommended discharge on Cipro and Flagyl for 2 weeks Repeat CT scan shows possible evidence of malignant ascites as well. We will monitor the results of the cytology sent out on the repeat paracentesis and if negative will send the patient for omental biopsy.  3. possible malignancy. Earlier CT scan for the patient only 4 days ago did not show any evidence of malignancy and last CT scan is making comments about having colonic wall thickening as well as omental caking. Along with paracentesis we will send cytology for further work-up. Patient already plan to have colonoscopy once medically stable.  Code Status: Full  Family Communication: no family at bedside  Disposition Plan: pending peritoneal fluid culture, GI recommendations,  IV antibiotic, monitor abdominal pain,   Consultants:  GI ID  Procedures:  Paracentesis 5/7- 1.5 L hazy, amber fluid removed  Cultures:  Urine, 5/7, no growth  Peritoneal fluid culture, no growth so far  DVT prophylaxis: SCDs   Objective: Vitals:   09/29/17 1420 09/29/17 2051 09/30/17 0544 09/30/17 1333  BP: 129/87 117/78 97/65 118/74  Pulse: 90 87 76 82  Resp: 18 18 16 16   Temp: 98.5 F (36.9 C) 98.9 F (37.2 C) 98.7 F (37.1 C) 97.9 F (36.6 C)  TempSrc: Oral Oral Oral Oral  SpO2: 97% 98% 100% 98%  Weight:      Height:        Intake/Output Summary (Last 24 hours) at 09/30/2017 1509 Last data filed  at 09/30/2017 1108 Gross per 24 hour  Intake 222 ml  Output -  Net 222 ml   Filed Weights   09/27/17 0500 09/28/17 0500 09/29/17 0500  Weight: 56.5 kg (124 lb 9 oz) 56.4 kg (124 lb 5.4 oz) 54.5 kg (120 lb 2.4 oz)    Exam:  Constitutional: male who  looks much older than stated age, in no distress Eyes: EOMI, anicteric ENMT: Oropharynx with moist mucous membranes, poor dentition Cardiovascular: No peripheral edema Respiratory: Normal respiratory effort on room air,  Abdomen: Soft, slightly distended, tenderness greatest with palpation in lower quadrants, guarding, no rebound tenderness, normal bowel sounds  Neurologic: Grossly no focal neuro deficit. Psychiatric:Appropriate affect. Irritable mood. Mental status AAOx3  Data Reviewed: CBC: Recent Labs  Lab 09/24/17 0417 09/25/17 0355 09/27/17 0702 09/30/17 0708  WBC 9.2 9.2 8.5 9.8  NEUTROABS 6.2  --  6.2  --   HGB 14.4 14.3 14.7 14.8  HCT 42.8 42.6 43.2 44.9  MCV 96.4 94.5 95.4 95.1  PLT 370 371 465* 591*   Basic Metabolic Panel: Recent Labs  Lab 09/24/17 0417 09/25/17 0355 09/27/17 0702 09/30/17 0708  NA 139 136 137 139  K 3.7 3.7 3.5 4.2  CL 104 100* 98* 103  CO2 25 27 27 29   GLUCOSE 82 97 88 102*  BUN 9 6 <5* 7  CREATININE 0.87 0.80 0.93 0.93  CALCIUM 8.7* 8.6* 8.7* 8.7*  MG  --   --  1.7  --    GFR: Estimated Creatinine Clearance: 77.3 mL/min (by C-G formula based on SCr of 0.93 mg/dL). Liver Function Tests: Recent Labs  Lab 09/24/17 0417 09/25/17 0355 09/27/17 0702  AST 15 17 18   ALT 14* 14* 20  ALKPHOS 107 113 174*  BILITOT 1.2 1.0 0.9  PROT 6.1* 6.1* 6.4*  ALBUMIN 3.0* 2.9* 2.9*   No results for input(s): LIPASE, AMYLASE in the last 168 hours. No results for input(s): AMMONIA in the last 168 hours. Coagulation Profile: Recent Labs  Lab 09/25/17 0355 09/27/17 0702  INR 1.08 1.21   Cardiac Enzymes: No results for input(s): CKTOTAL, CKMB, CKMBINDEX, TROPONINI in the last 168 hours. BNP (last 3 results) No results for input(s): PROBNP in the last 8760 hours. HbA1C: No results for input(s): HGBA1C in the last 72 hours. CBG: Recent Labs  Lab 09/28/17 2358 09/29/17 0822 09/29/17 1750 09/30/17 0010 09/30/17 0803  GLUCAP 125* 87 92  106* 96   Lipid Profile: No results for input(s): CHOL, HDL, LDLCALC, TRIG, CHOLHDL, LDLDIRECT in the last 72 hours. Thyroid Function Tests: No results for input(s): TSH, T4TOTAL, FREET4, T3FREE, THYROIDAB in the last 72 hours. Anemia Panel: No results for input(s): VITAMINB12, FOLATE, FERRITIN, TIBC, IRON, RETICCTPCT in the last 72 hours. Urine analysis:    Component Value Date/Time   COLORURINE YELLOW 09/24/2017 0122   APPEARANCEUR CLEAR 09/24/2017 0122   LABSPEC >1.046 (H) 09/24/2017 0122   PHURINE 5.0 09/24/2017 0122   GLUCOSEU NEGATIVE 09/24/2017 0122   HGBUR NEGATIVE 09/24/2017 0122   BILIRUBINUR NEGATIVE 09/24/2017 0122   KETONESUR 20 (A) 09/24/2017 0122   PROTEINUR NEGATIVE 09/24/2017 0122   UROBILINOGEN 1.0 11/13/2014 1800   NITRITE NEGATIVE 09/24/2017 0122   LEUKOCYTESUR NEGATIVE 09/24/2017 0122   Sepsis Labs: @LABRCNTIP (procalcitonin:4,lacticidven:4)  ) Recent Results (from the past 240 hour(s))  Urine culture     Status: None   Collection Time: 09/24/17  1:22 AM  Result Value Ref Range Status   Specimen Description URINE, RANDOM  Final  Special Requests STERILE CONTAINER SENT  Final   Culture   Final    NO GROWTH Performed at [facility], [place] [address] [place], [place] [address]    Report Status 09/25/2017 FINAL  Final  Body fluid culture     Status: None   Collection Time: 09/24/17 12:00 PM  Result Value Ref Range Status   Specimen Description PERITONEAL CAVITY  Final   Special Requests NONE  Final   Gram Stain   Final    RARE WBC PRESENT,BOTH PMN AND MONONUCLEAR NO ORGANISMS SEEN    Culture   Final    NO GROWTH 3 DAYS Performed at [facility], [place] [address] [place], [place] [address]    Report Status 09/27/2017 FINAL  Final  Acid Fast Smear (AFB)     Status: None   Collection Time: 09/28/17 10:36 AM  Result Value Ref Range Status   AFB Specimen Processing Concentration  Final   Acid Fast Smear Negative  Final    Comment:  (NOTE) Performed At: [facility] [place], [place] [id number] Rush Farmer MD [id number]    Source (AFB) PERITONEAL  Final    Comment: Performed at [facility], [place] [address] [place], [place] [address]  Gram stain     Status: None   Collection Time: 09/28/17 10:36 AM  Result Value Ref Range Status   Specimen Description PERITONEAL  Final   Special Requests NONE  Final   Gram Stain   Final    CYTOSPIN SMEAR WBC PRESENT, PREDOMINANTLY PMN NO ORGANISMS SEEN Performed at [facility], [place] [address], [place], [place] [address]    Report Status 09/28/2017 FINAL  Final  Culture, body fluid-bottle     Status: None (Preliminary result)   Collection Time: 09/28/17 10:36 AM  Result Value Ref Range Status   Specimen Description PERITONEAL  Final   Special Requests NONE  Final   Culture   Final    NO GROWTH 2 DAYS Performed at [facility] [address] [place], [place] [address]    Report Status PENDING  Incomplete      Studies: No results found.  Scheduled Meds: . feeding supplement (ENSURE ENLIVE)  237 mL Oral TID BM    Continuous Infusions: . cefTRIAXone (ROCEPHIN)  IV 2 g (09/30/17 1407)  . metronidazole Stopped (09/30/17 1335)     LOS: 6 days     Berle Mull, MD [facility]  If 7PM-7AM, please contact night-coverage [email or web address] Password [facility] 09/30/2017, 3:09 PM

## 2017-09-30 NOTE — Consult Note (Signed)
Derek Lloyd 10-08-1970  [id number].    Requesting MD: Dr. Ronald Lobo  Chief Complaint/Reason for Consult: ? Crohn's disease, ? Sigmoid colon mass  HPI:  This is a 47 yo white male with no past medical history per the patient who does not visit doctors who began having some RLQ abdominal pain since September of 2018.  He was admitted to [facility] for several days with a RLQ abdominal and suspicion for perforated appendicitis.  He was going to have a drain placed but he left AMA.  He states he improved despite leaving.  He returned in March of this year with similar complaints of RLQ.  He denies any fevers.  He was admitted and had a CT scan that revealed terminal ileitis and appeared to be c/w crohn's disease.  GI was consulted and he was started on abx therapy and steroid.  He once again left AMA claiming issues with getting back to school.    He presents again with RLQ abdominal pain, nausea, vomiting, but no diarrhea last Monday.  He states he has had normal BMs with occasional blood present he thinks from hemorrhoids.  He initially had a CT scan that was once again concerning for crohn's disease and was started on abx therapy and steroids.  He was found now to have ascites and was tapped.  This fluid showed high WBCs.  He continued to have pain and a CT scan was then reordered about 4 days after admission.  This CT scan once again revealed recurrent ascites, but now with concerns for omental caking and possible sigmoid colonic thickening.  we have been asked to see the patient today for further recommendations.   ROS: ROS: Please see HPI, otherwise all other systems have been reviewed and are negative.  Family History  Problem Relation Age of Onset  . Prostate cancer Paternal Grandfather   . Crohn's disease Neg Hx     Past Medical History:  Diagnosis Date  . Abdominal pain 09/2017  . Anxiety attack   . Headache    "frequently" (08/14/2017)  . Heart murmur    "when I was a kid"  (08/14/2017)  . Small bowel obstruction ([place]) 08/13/2017    Past Surgical History:  Procedure Laterality Date  . FINGER SURGERY Left    "thumb"  . IR PARACENTESIS  09/24/2017  . TYMPANOSTOMY TUBE PLACEMENT Bilateral     Social History:  reports that he quit smoking about a year ago. His smoking use included cigarettes. He smoked 1.00 pack per day. He has never used smokeless tobacco. He reports that he drinks alcohol. He reports that he does not use drugs.  Allergies:  Allergies  Allergen Reactions  . Asa [Aspirin] Rash    Childhood reaction; but tolerates well now  . Piperacillin-Tazobactam In Dex Rash    Noted on May 2019 admission.  Tolerates Cephalosporins.    Medications Prior to Admission  Medication Sig Dispense Refill  . Garlic 2233 MG CAPS Take 1,000 mg by mouth daily.    Marland Kitchen ibuprofen (ADVIL,MOTRIN) 200 MG tablet Take 600 mg by mouth every 6 (six) hours as needed.    Marland Kitchen OVER THE COUNTER MEDICATION Take 1 tablet by mouth daily. Gensing    . vitamin C (ASCORBIC ACID) 500 MG tablet Take 1,000 mg by mouth daily.       Physical Exam: Blood pressure 118/74, pulse 82, temperature 97.9 F (36.6 C), temperature source Oral, resp. rate 16, height 5' 10"  (1.778 m), weight  54.5 kg (120 lb 2.4 oz), SpO2 98 %. General: skinny white male who is laying in bed in NAD HEENT: head is normocephalic, atraumatic.  Sclera are noninjected.  PERRL.  Ears and nose without any masses or lesions.  Mouth is pink and moist Heart: regular, rate, and rhythm.  Normal s1,s2. No obvious murmurs, gallops, or rubs noted.  Palpable radial and pedal pulses bilaterally Lungs: CTAB, no wheezes, rhonchi, or rales noted.  Respiratory effort nonlabored Abd: soft, some diffuse tenderness, but greatest in RLQ, voluntary guarding with deep palpation in RLQ, otherwise no peritoneal signs or rebound. bloating, +BS, no masses, hernias, or organomegaly MS: all 4 extremities are symmetrical with no cyanosis, clubbing, or  edema. Skin: warm and dry with no masses, lesions, or rashes Psych: A&Ox3 with an appropriate affect.   Results for orders placed or performed during the hospital encounter of 09/23/17 (from the past 48 hour(s))  Glucose, capillary     Status: Abnormal   Collection Time: 09/28/17  5:42 PM  Result Value Ref Range   Glucose-Capillary 103 (H) 65 - 99 mg/dL  Glucose, capillary     Status: Abnormal   Collection Time: 09/28/17 11:58 PM  Result Value Ref Range   Glucose-Capillary 125 (H) 65 - 99 mg/dL  Glucose, capillary     Status: None   Collection Time: 09/29/17  8:22 AM  Result Value Ref Range   Glucose-Capillary 87 65 - 99 mg/dL  Glucose, capillary     Status: None   Collection Time: 09/29/17  5:50 PM  Result Value Ref Range   Glucose-Capillary 92 65 - 99 mg/dL  Glucose, capillary     Status: Abnormal   Collection Time: 09/30/17 12:10 AM  Result Value Ref Range   Glucose-Capillary 106 (H) 65 - 99 mg/dL  CBC     Status: Abnormal   Collection Time: 09/30/17  7:08 AM  Result Value Ref Range   WBC 9.8 4.0 - 10.5 K/uL   RBC 4.72 4.22 - 5.81 MIL/uL   Hemoglobin 14.8 13.0 - 17.0 g/dL   HCT 44.9 39.0 - 52.0 %   MCV 95.1 78.0 - 100.0 fL   MCH 31.4 26.0 - 34.0 pg   MCHC 33.0 30.0 - 36.0 g/dL   RDW 12.3 11.5 - 15.5 %   Platelets 501 (H) 150 - 400 K/uL    Comment: Performed at [facility], [place] [address], [place], [place] [address]  Basic metabolic panel     Status: Abnormal   Collection Time: 09/30/17  7:08 AM  Result Value Ref Range   Sodium 139 135 - 145 mmol/L   Potassium 4.2 3.5 - 5.1 mmol/L   Chloride 103 101 - 111 mmol/L   CO2 29 22 - 32 mmol/L   Glucose, Bld 102 (H) 65 - 99 mg/dL   BUN 7 6 - 20 mg/dL   Creatinine, Ser 0.93 0.61 - 1.24 mg/dL   Calcium 8.7 (L) 8.9 - 10.3 mg/dL   GFR calc non Af Amer >60 >60 mL/min   GFR calc Af Amer >60 >60 mL/min    Comment: (NOTE) The eGFR has been calculated using the CKD EPI equation. This calculation has not been validated in  all clinical situations. eGFR's persistently <60 mL/min signify possible Chronic Kidney Disease.    Anion gap 7 5 - 15    Comment: Performed at [facility] [address], [place], [place] [address]  Lactic acid, plasma     Status: None  Collection Time: 09/30/17  7:08 AM  Result Value Ref Range   Lactic Acid, Venous 0.5 0.5 - 1.9 mmol/L    Comment: Performed at [facility], [place] [address], [place], [place] [address]  Glucose, capillary     Status: None   Collection Time: 09/30/17  8:03 AM  Result Value Ref Range   Glucose-Capillary 96 65 - 99 mg/dL   No results found.    Assessment/Plan Abdominal pain with terminal ileitis, ?crohn's disease/sigmiod thickening/ascites The patient has been having RLQ abdominal pain with symptoms c/w crohn's disease now for 8 months or more.  His CT scan is suggestive of crohn's disease.  On his most recent imaging though there is a concern for sigmoid wall thickening and possible omental caking, which was not seen on his previous scans.  After evaluating the patient and reviewing his imaging, we feel that proceeding with a colonoscopy will hopefully give [place] the information needed to further determine an etiology of what all is going on in this patient.  A colonoscopy will give direct visualization to determine if he has a sigmoid mass and also to evaluate his TI if able to get there.  He does not have an acute abdomen that currently requires any type of surgical intervention.  We will follow along.   Henreitta Cea, [facility] 09/30/2017, 2:38 PM Pager: [phone]

## 2017-10-01 LAB — GLUCOSE, CAPILLARY
GLUCOSE-CAPILLARY: 112 mg/dL — AB (ref 65–99)
Glucose-Capillary: 90 mg/dL (ref 65–99)
Glucose-Capillary: 85 mg/dL (ref 65–99)

## 2017-10-01 MED ORDER — PEG 3350-KCL-NA BICARB-NACL 420 G PO SOLR
4000.0000 mL | Freq: Once | ORAL | Status: AC
  Administered 2017-10-01: 4000 mL via ORAL
  Filled 2017-10-01: qty 4000

## 2017-10-01 NOTE — Care Management Note (Signed)
Case Management Note  Patient Details  Name: Derek Lloyd MRN: [id number] Date of Birth: [birth year]  Subjective/Objective:    Pt admitted with abd distention and pain - suspected malignancy             Action/Plan:   PTA independent from home.  Pt will likely benefit from [facility] at discharge.     Expected Discharge Date:                  Expected Discharge Plan:     In-House Referral:     Discharge planning Services  CM Consult  Post Acute Care Choice:    Choice offered to:     DME Arranged:    DME Agency:     HH Arranged:    HH Agency:     Status of Service:     If discussed at [employer], dates discussed:    Additional Comments: Pt remains on IV antibiotics, plan is for colonoscopy in the am.  CM will continue to follow for discharge needs Maryclare Labrador, RN 10/01/2017, 3:58 PM

## 2017-10-01 NOTE — Progress Notes (Signed)
[facility] Progress Note  Patient: Derek Lloyd WNI:[id number]   PCP: Patient, [name] DOB: [birth year]   DOA: 09/23/2017   DOS: 10/01/2017   Date of Service: the patient was seen and examined on 10/01/2017  Subjective: Continues to have abdominal pain and distention.  No nausea no vomiting and ate 100% of his regular meal.  Passing gas.  Brief hospital course: Pt. with PMH of anxiety; admitted on 09/23/2017, presented with complaint of abdominal pain ongoing since last few months, was found to have SBP and possible Crohn's disease.  Underwent paracentesis twice.  Last CT scan also made comments about possible omental caking and sigmoid wall thickening.  GI and general surgery both on board Currently further plan is colonoscopy tomorrow.  Assessment and Plan: 1.  Ascites. SBP. S/P paracentesis x2, 09/24/2017 and 09/28/2017. Initial WBC count was 6050 on a repeat one count improved to 2347. Culture on both fluids are negative. Cytology on both fluid was also negative for any malignant cells. Started on ceftriaxone on admission, changed to IV Zosyn, changed back to IV ceftriaxone and Flagyl due to allergic reaction with Zosyn. ID consulted, recommend complete 14-day treatment course.  Continuing IV antibiotics while inpatient and drainage to oral Cipro and Flagyl once leave the hospital. Suspect that this SBP is more likely secondary to inflammation caused by Crohn's disease rather than an active infection, since cultures are negative.  2.  Suspected Crohn's disease. Terminal ileitis on the CT scan. Sigmoid wall thickening on the CT scan. Chronic abdominal pain Patient has been presenting multiple times with abdominal pain to the hospital and has left AMA in the past. Underwent multiple CT scans so far There is concern for possible colitis and GI has been consulted. Scheduled for colonoscopy tomorrow. If the colonoscopy is positive for Crohn's disease GI feels that the patient would be  a very poor candidate for medical treatment and would ideally recommend surgical treatment.  3.  Chronic abdominal pain. Patient was initially started on IV Dilaudid 1 mg every 3 hours as needed. Now with continuation of the antibiotics patient successfully transition to oral narcotics only. Depending on further treatment course of action with possible surgery pain management may be changed down the road.  4.  Suspected malignancy. Masslike thickening of the sigmoid colon. Omental caking. Patient has multiple CT scan of the abdomen, 3 in September 2018, one in November 2018, one in March 2019 and 2 in May 2019. The last CT scan on Sep 26, 2017 made a comment that the patient has omental caking appearance as well as masslike thickening of the sigmoid colon.  Prior CT scan did not make any comments like this. Cytology x2 is negative on the sciatic fluid. GI is performing colonoscopy tomorrow. If no malignancy or suspicious lesion identified on the colonoscopy patient may require omental biopsy. If patient has Crohn's disease and surgical correction is decided, omental biopsy can be performed at the surgery time. If colonoscopy is positive for malignancy await for cytology report.  5.  Anxiety. Although the patient is not on any medications at home, appears to have significant anxiety. Monitor for now.  Diet: Tolerating soft diet, now on clear liquids as well as n.p.o. after midnight for colonoscopy tomorrow. DVT Prophylaxis: subcutaneous Heparin  Advance goals of care discussion: full code  Family Communication: no family was present at bedside, at the time of interview.   Disposition:  Discharge to home, ambulating in the hallway independently.  Consultants: gastroenterology, General surgery, IR  Procedures: US paracentesis, 09/24/2017 and 09/28/2017  Antibiotics: Anti-infectives (From admission, onward)   Start     Dose/Rate Route Frequency Ordered Stop   09/25/17 1315  cefTRIAXone  (ROCEPHIN) 2 g in sodium chloride 0.9 % 100 mL IVPB     2 g 200 mL/hr over 30 Minutes Intravenous Every 24 hours 09/25/17 1308     09/25/17 1230  metroNIDAZOLE (FLAGYL) IVPB 500 mg     500 mg 100 mL/hr over 60 Minutes Intravenous Every 8 hours 09/25/17 1213     09/24/17 1630  piperacillin-tazobactam (ZOSYN) IVPB 3.375 g  Status:  Discontinued     3.375 g 12.5 mL/hr over 240 Minutes Intravenous Every 8 hours 09/24/17 1622 09/25/17 1211   09/24/17 0800  cefTRIAXone (ROCEPHIN) 2 g in sodium chloride 0.9 % 100 mL IVPB  Status:  Discontinued     2 g 200 mL/hr over 30 Minutes Intravenous Every 24 hours 09/24/17 0733 09/24/17 1615       Objective: Physical Exam: Vitals:   09/30/17 2124 10/01/17 0500 10/01/17 0628 10/01/17 1359  BP: 107/69  115/79 114/86  Pulse: 78  78 88  Resp: 18  18 19   Temp: 98 F (36.7 C)  98.1 F (36.7 C) 97.8 F (36.6 C)  TempSrc: Oral     SpO2: 97%  99% 99%  Weight:  55.7 kg (122 lb 12.7 oz)    Height:        Intake/Output Summary (Last 24 hours) at 10/01/2017 1536 Last data filed at 10/01/2017 1000 Gross per 24 hour  Intake 0 ml  Output 200 ml  Net -200 ml   Filed Weights   09/28/17 0500 09/29/17 0500 10/01/17 0500  Weight: 56.4 kg (124 lb 5.4 oz) 54.5 kg (120 lb 2.4 oz) 55.7 kg (122 lb 12.7 oz)   General: Alert, Awake and Oriented to Time, Place and Person. Appear in mild distress, affect appropriate Eyes: PERRL, Conjunctiva normal ENT: Oral Mucosa clear moist. Neck: no JVD, no Abnormal Mass Or lumps Cardiovascular: S1 and S2 Present, no Murmur, Peripheral Pulses Present Respiratory: normal respiratory effort, Bilateral Air entry equal and Decreased, no use of accessory muscle, Clear to Auscultation, no Crackles, no wheezes Abdomen: Bowel Sound present, Soft and mild tenderness, no hernia Skin: no redness, no Rash, no induration Extremities: no Pedal edema, no calf tenderness Neurologic: Grossly no focal neuro deficit. Bilaterally Equal motor  strength  Data Reviewed: CBC: Recent Labs  Lab 09/25/17 0355 09/27/17 0702 09/30/17 0708  WBC 9.2 8.5 9.8  NEUTROABS  --  6.2  --   HGB 14.3 14.7 14.8  HCT 42.6 43.2 44.9  MCV 94.5 95.4 95.1  PLT 371 465* 825*   Basic Metabolic Panel: Recent Labs  Lab 09/25/17 0355 09/27/17 0702 09/30/17 0708  NA 136 137 139  K 3.7 3.5 4.2  CL 100* 98* 103  CO2 27 27 29   GLUCOSE 97 88 102*  BUN 6 <5* 7  CREATININE 0.80 0.93 0.93  CALCIUM 8.6* 8.7* 8.7*  MG  --  1.7  --     Liver Function Tests: Recent Labs  Lab 09/25/17 0355 09/27/17 0702  AST 17 18  ALT 14* 20  ALKPHOS 113 174*  BILITOT 1.0 0.9  PROT 6.1* 6.4*  ALBUMIN 2.9* 2.9*   No results for input(s): LIPASE, AMYLASE in the last 168 hours. No results for input(s): AMMONIA in the last 168 hours. Coagulation Profile: Recent Labs  Lab 09/25/17 0355 09/27/17 0702  INR 1.08 1.21  Cardiac Enzymes: No results for input(s): CKTOTAL, CKMB, CKMBINDEX, TROPONINI in the last 168 hours. BNP (last 3 results) No results for input(s): PROBNP in the last 8760 hours. CBG: Recent Labs  Lab 09/30/17 0010 09/30/17 0803 09/30/17 1612 10/01/17 0026 10/01/17 0751  GLUCAP 106* 96 138* 112* 90   Studies: No results found.  Scheduled Meds: . feeding supplement (ENSURE ENLIVE)  237 mL Oral TID BM   Continuous Infusions: . cefTRIAXone (ROCEPHIN)  IV Stopped (10/01/17 1350)  . metronidazole Stopped (10/01/17 1313)   PRN Meds: acetaminophen, hydrocortisone, lidocaine, ondansetron **OR** ondansetron (ZOFRAN) IV, oxyCODONE-acetaminophen, witch hazel-glycerin, zolpidem  Time spent: 35 minutes  Author: Berle Mull, MD [facility] Pager: [phone] 10/01/2017 3:36 PM  If 7PM-7AM, please contact night-coverage at [email or web address], password [facility]

## 2017-10-01 NOTE — Progress Notes (Addendum)
Patient having abdominal cramping, paged gastroenterology to make aware.  Provider advised to allow one hour of rest then resume drinking prep,once patient has clear bowel movements (beef broth) patient may stop prep.

## 2017-10-01 NOTE — Progress Notes (Signed)
Patient ID: Derek Lloyd, male   DOB: 1970/12/23, 47 y.o.   MRN: [id number]       Subjective: No new complaints.  Still with pain in his abdomen diffusely.  Objective: Vital signs in last 24 hours: Temp:  [97.9 F (36.6 C)-98.1 F (36.7 C)] 98.1 F (36.7 C) (05/14 0628) Pulse Rate:  [78-82] 78 (05/14 0628) Resp:  [16-18] 18 (05/14 0628) BP: (107-118)/(69-79) 115/79 (05/14 0628) SpO2:  [97 %-99 %] 99 % (05/14 0628) Weight:  [55.7 kg (122 lb 12.7 oz)] 55.7 kg (122 lb 12.7 oz) (05/14 0500) Last BM Date: 10/01/17  Intake/Output from previous day: 05/13 0701 - 05/14 0700 In: 444 [P.O.:444] Out: 200 [Urine:200] Intake/Output this shift: No intake/output data recorded.  PE: Abd: soft, but distended, likely with recurrent ascites.  Diffusely tender, but no peritoneal signs or rebounding.  Lab Results:  Recent Labs    09/30/17 0708  WBC 9.8  HGB 14.8  HCT 44.9  PLT 501*   BMET Recent Labs    09/30/17 0708  NA 139  K 4.2  CL 103  CO2 29  GLUCOSE 102*  BUN 7  CREATININE 0.93  CALCIUM 8.7*   PT/INR No results for input(s): LABPROT, INR in the last 72 hours. CMP     Component Value Date/Time   NA 139 09/30/2017 0708   K 4.2 09/30/2017 0708   CL 103 09/30/2017 0708   CO2 29 09/30/2017 0708   GLUCOSE 102 (H) 09/30/2017 0708   BUN 7 09/30/2017 0708   CREATININE 0.93 09/30/2017 0708   CALCIUM 8.7 (L) 09/30/2017 0708   PROT 6.4 (L) 09/27/2017 0702   ALBUMIN 2.9 (L) 09/27/2017 0702   AST 18 09/27/2017 0702   ALT 20 09/27/2017 0702   ALKPHOS 174 (H) 09/27/2017 0702   BILITOT 0.9 09/27/2017 0702   GFRNONAA >60 09/30/2017 0708   GFRAA >60 09/30/2017 0708   Lipase     Component Value Date/Time   LIPASE 22 09/23/2017 1236       Studies/Results: No results found.  Anti-infectives: Anti-infectives (From admission, onward)   Start     Dose/Rate Route Frequency Ordered Stop   09/25/17 1315  cefTRIAXone (ROCEPHIN) 2 g in sodium chloride 0.9 % 100 mL IVPB      2 g 200 mL/hr over 30 Minutes Intravenous Every 24 hours 09/25/17 1308     09/25/17 1230  metroNIDAZOLE (FLAGYL) IVPB 500 mg     500 mg 100 mL/hr over 60 Minutes Intravenous Every 8 hours 09/25/17 1213     09/24/17 1630  piperacillin-tazobactam (ZOSYN) IVPB 3.375 g  Status:  Discontinued     3.375 g 12.5 mL/hr over 240 Minutes Intravenous Every 8 hours 09/24/17 1622 09/25/17 1211   09/24/17 0800  cefTRIAXone (ROCEPHIN) 2 g in sodium chloride 0.9 % 100 mL IVPB  Status:  Discontinued     2 g 200 mL/hr over 30 Minutes Intravenous Every 24 hours 09/24/17 0733 09/24/17 1615       Assessment/Plan  Abdominal pain with terminal ileitis, ?crohn's disease/sigmiod thickening/ascites -recommend colonoscopy to further determine what all is going on in his intestines to determine if or what type of surgical intervention may be necessary. -we will follow   FEN - soft diet VTE - SCDs ID - rocephin/flagyl   LOS: 7 days    Henreitta Cea , [facility] Surgery 10/01/2017, 8:50 AM Pager: [phone]

## 2017-10-02 ENCOUNTER — Inpatient Hospital Stay (HOSPITAL_COMMUNITY): Payer: BLUE CROSS/BLUE SHIELD | Admitting: Anesthesiology

## 2017-10-02 ENCOUNTER — Encounter (HOSPITAL_COMMUNITY): Payer: Self-pay | Admitting: Gastroenterology

## 2017-10-02 ENCOUNTER — Encounter (HOSPITAL_COMMUNITY)
Admission: EM | Disposition: A | Discharge status: Home / Self Care | Payer: Self-pay | Source: Home / Self Care | Attending: Internal Medicine

## 2017-10-02 DIAGNOSIS — R180 Malignant ascites: Secondary | ICD-10-CM

## 2017-10-02 DIAGNOSIS — R1030 Lower abdominal pain, unspecified: Secondary | ICD-10-CM

## 2017-10-02 DIAGNOSIS — K668 Other specified disorders of peritoneum: Secondary | ICD-10-CM

## 2017-10-02 DIAGNOSIS — K639 Disease of intestine, unspecified: Secondary | ICD-10-CM

## 2017-10-02 HISTORY — PX: SUBMUCOSAL INJECTION: SHX5543

## 2017-10-02 HISTORY — PX: COLONOSCOPY WITH PROPOFOL: SHX5780

## 2017-10-02 HISTORY — PX: POLYPECTOMY: SHX5525

## 2017-10-02 HISTORY — PX: BIOPSY: SHX5522

## 2017-10-02 LAB — COMPREHENSIVE METABOLIC PANEL
ALBUMIN: 2.4 g/dL — AB (ref 3.5–5.0)
ALK PHOS: 123 U/L (ref 38–126)
ALT: 18 U/L (ref 17–63)
ANION GAP: 10 (ref 5–15)
AST: 33 U/L (ref 15–41)
BUN: 5 mg/dL — AB (ref 6–20)
CALCIUM: 8.5 mg/dL — AB (ref 8.9–10.3)
CO2: 26 mmol/L (ref 22–32)
CREATININE: 0.83 mg/dL (ref 0.61–1.24)
Chloride: 103 mmol/L (ref 101–111)
GFR calc Af Amer: >60 mL/min (ref >60)
GFR calc non Af Amer: >60 mL/min (ref >60)
GLUCOSE: 83 mg/dL (ref 65–99)
Potassium: 3.8 mmol/L (ref 3.5–5.1)
Sodium: 139 mmol/L (ref 135–145)
Total Bilirubin: 0.3 mg/dL (ref 0.3–1.2)
Total Protein: 5.2 g/dL — ABNORMAL LOW (ref 6.5–8.1)

## 2017-10-02 LAB — CBC WITH DIFFERENTIAL/PLATELET
ABS IMMATURE GRANULOCYTES: 0 10*3/uL (ref 0.0–0.1)
Basophils Absolute: 0 10*3/uL (ref 0.0–0.1)
Basophils Relative: 1 %
EOS PCT: 4 %
Eosinophils Absolute: 0.2 10*3/uL (ref 0.0–0.7)
HEMATOCRIT: 41.6 % (ref 39.0–52.0)
Hemoglobin: 13.4 g/dL (ref 13.0–17.0)
Immature Granulocytes: 1 %
Lymphocytes Relative: 27 %
Lymphs Abs: 1.5 10*3/uL (ref 0.7–4.0)
MCH: 31.2 pg (ref 26.0–34.0)
MCHC: 32.2 g/dL (ref 30.0–36.0)
MCV: 97 fL (ref 78.0–100.0)
MONO ABS: 0.6 10*3/uL (ref 0.1–1.0)
MONOS PCT: 11 %
NEUTROS ABS: 3.2 10*3/uL (ref 1.7–7.7)
Neutrophils Relative %: 58 %
Platelets: 494 10*3/uL — ABNORMAL HIGH (ref 150–400)
RBC: 4.29 MIL/uL (ref 4.22–5.81)
RDW: 12.2 % (ref 11.5–15.5)
WBC: 5.5 10*3/uL (ref 4.0–10.5)

## 2017-10-02 LAB — MAGNESIUM: Magnesium: 1.6 mg/dL — ABNORMAL LOW (ref 1.7–2.4)

## 2017-10-02 LAB — GLUCOSE, CAPILLARY
GLUCOSE-CAPILLARY: 86 mg/dL (ref 65–99)
Glucose-Capillary: 113 mg/dL — ABNORMAL HIGH (ref 65–99)
Glucose-Capillary: 74 mg/dL (ref 65–99)
Glucose-Capillary: 75 mg/dL (ref 65–99)
Glucose-Capillary: 83 mg/dL (ref 65–99)

## 2017-10-02 SURGERY — COLONOSCOPY WITH PROPOFOL
Anesthesia: Monitor Anesthesia Care | Site: Rectum

## 2017-10-02 MED ORDER — LIDOCAINE 2% (20 MG/ML) 5 ML SYRINGE
INTRAMUSCULAR | Status: DC | PRN
  Administered 2017-10-02: 60 mg via INTRAVENOUS

## 2017-10-02 MED ORDER — SPOT INK MARKER SYRINGE KIT
PACK | SUBMUCOSAL | Status: AC
  Filled 2017-10-02: qty 5

## 2017-10-02 MED ORDER — LACTATED RINGERS IV SOLN
INTRAVENOUS | Status: DC | PRN
  Administered 2017-10-02: 11:00:00 via INTRAVENOUS

## 2017-10-02 MED ORDER — SPOT INK MARKER SYRINGE KIT
PACK | SUBMUCOSAL | Status: DC | PRN
  Administered 2017-10-02: 1 mL via SUBMUCOSAL

## 2017-10-02 MED ORDER — PROPOFOL 10 MG/ML IV BOLUS
INTRAVENOUS | Status: DC | PRN
  Administered 2017-10-02: 30 mg via INTRAVENOUS

## 2017-10-02 MED ORDER — PROPOFOL 500 MG/50ML IV EMUL
INTRAVENOUS | Status: DC | PRN
  Administered 2017-10-02: 100 ug/kg/min via INTRAVENOUS

## 2017-10-02 MED ORDER — FENTANYL CITRATE (PF) 100 MCG/2ML IJ SOLN
INTRAMUSCULAR | Status: DC | PRN
  Administered 2017-10-02 (×3): 50 ug via INTRAVENOUS

## 2017-10-02 MED ORDER — PHENYLEPHRINE 40 MCG/ML (10ML) SYRINGE FOR IV PUSH (FOR BLOOD PRESSURE SUPPORT)
PREFILLED_SYRINGE | INTRAVENOUS | Status: DC | PRN
  Administered 2017-10-02: 80 ug via INTRAVENOUS

## 2017-10-02 MED ORDER — MIDAZOLAM HCL 5 MG/5ML IJ SOLN
INTRAMUSCULAR | Status: DC | PRN
  Administered 2017-10-02: 2 mg via INTRAVENOUS

## 2017-10-02 SURGICAL SUPPLY — 22 items
ELECT REM PT RETURN 9FT ADLT (ELECTROSURGICAL)
ELECTRODE REM PT RTRN 9FT ADLT (ELECTROSURGICAL) IMPLANT
FCP BXJMBJMB 240X2.8X (CUTTING FORCEPS)
FLOOR PAD 36X40 (MISCELLANEOUS) ×5
FORCEPS BIOP RAD 4 LRG CAP 4 (CUTTING FORCEPS) IMPLANT
FORCEPS BIOP RJ4 240 W/NDL (CUTTING FORCEPS)
FORCEPS BXJMBJMB 240X2.8X (CUTTING FORCEPS) IMPLANT
INJECTOR/SNARE I SNARE (MISCELLANEOUS) IMPLANT
LUBRICANT JELLY 4.5OZ STERILE (MISCELLANEOUS) IMPLANT
MANIFOLD NEPTUNE II (INSTRUMENTS) IMPLANT
NDL SCLEROTHERAPY 25GX240 (NEEDLE) IMPLANT
NEEDLE SCLEROTHERAPY 25GX240 (NEEDLE) IMPLANT
PAD FLOOR 36X40 (MISCELLANEOUS) ×3 IMPLANT
PROBE APC STR FIRE (PROBE) IMPLANT
PROBE INJECTION GOLD (MISCELLANEOUS)
PROBE INJECTION GOLD 7FR (MISCELLANEOUS) IMPLANT
SNARE ROTATE MED OVAL 20MM (MISCELLANEOUS) IMPLANT
SYR 50ML LL SCALE MARK (SYRINGE) IMPLANT
TRAP SPECIMEN MUCOUS 40CC (MISCELLANEOUS) IMPLANT
TUBING ENDO SMARTCAP PENTAX (MISCELLANEOUS) IMPLANT
TUBING IRRIGATION ENDOGATOR (MISCELLANEOUS) ×5 IMPLANT
WATER STERILE IRR 1000ML POUR (IV SOLUTION) IMPLANT

## 2017-10-02 NOTE — Progress Notes (Signed)
PROGRESS NOTE    Derek Lloyd  SKA:[id number] DOB: 1971-03-27 DOA: 09/23/2017 PCP: Patient, [name]   Brief Narrative:   47 year old with history of anxiety and chronic abdominal pain came to the hospital with complaints of abdominal pain.  On the CT he was found to have thickening of his sigmoid colon which appeared to be masslike along with omental caking.  There is also noted to have some ascites therefore underwent paracentesis-therapeutic and diagnostic.  Cultures and cytology were mostly unrevealing but he was started on Rocephin which was later switched to Rocephine and Flagyl.  There is suspicion for possible Crohn's due to terminal ileitis.  Gastroenterology was consulted who plans on performing colonoscopy on 5/15.  Assessment & Plan:   Active Problems:   Abdominal pain   Ascites  Acute on chronic abdominal pain Terminal ileitis concerning for Crohn's disease Sigmoid wall thickening concerning for colitis Ascites with spontaneous bacterial peritonitis -Patient has undergone paracentesis twice 5/7 and 5/11.  Initial WBC count was elevated which improved on repeat paracentesis.  Cytology and cultures are remain negative.  Initially on broad-spectrum antibiotic, IV Zosyn and now has been transitioned to oral Rocephine and Flagyl. -CT of the abdomen is concerning for mass and omental caking therefore patient scheduled for colonoscopy today which will help further guide [place] therapy.  If necessary will need surgical biopsy, surgery team aware.  Continue pain control  DVT prophylaxis: Subcutaneous heparin Code Status: Full code none at bedside Family Communication:   Disposition Plan: Maintain inpatient stay  Consultants:   Gastroenterology  General surgery  Procedures:   Ultrasound paracentesis 5/7, 5/11  Colonoscopy today  Antimicrobials:  Anti-infectives (From admission, onward)   Start     Dose/Rate Route Frequency Ordered Stop   09/25/17 1315  [MAR Hold]   cefTRIAXone (ROCEPHIN) 2 g in sodium chloride 0.9 % 100 mL IVPB     (MAR Hold since Wed 10/02/2017 at 1044. Reason: Transfer to a Procedural area.)   2 g 200 mL/hr over 30 Minutes Intravenous Every 24 hours 09/25/17 1308     09/25/17 1230  [MAR Hold]  metroNIDAZOLE (FLAGYL) IVPB 500 mg     (MAR Hold since Wed 10/02/2017 at 1044. Reason: Transfer to a Procedural area.)   500 mg 100 mL/hr over 60 Minutes Intravenous Every 8 hours 09/25/17 1213     09/24/17 1630  piperacillin-tazobactam (ZOSYN) IVPB 3.375 g  Status:  Discontinued     3.375 g 12.5 mL/hr over 240 Minutes Intravenous Every 8 hours 09/24/17 1622 09/25/17 1211   09/24/17 0800  cefTRIAXone (ROCEPHIN) 2 g in sodium chloride 0.9 % 100 mL IVPB  Status:  Discontinued     2 g 200 mL/hr over 30 Minutes Intravenous Every 24 hours 09/24/17 0733 09/24/17 1615      Subjective: No acute events overnight. Still reports of lower abd discomfort.   Review of Systems Otherwise negative except as per HPI, including: General: Denies fever, chills, night sweats or unintended weight loss. Resp: Denies cough, wheezing, shortness of breath. Cardiac: Denies chest pain, palpitations, orthopnea, paroxysmal nocturnal dyspnea. GI: Denies  nausea, vomiting, diarrhea or constipation GU: Denies dysuria, frequency, hesitancy or incontinence MS: Denies muscle aches, joint pain or swelling Neuro: Denies headache, neurologic deficits (focal weakness, numbness, tingling), abnormal gait Psych: Denies anxiety, depression, SI/HI/AVH Skin: Denies new rashes or lesions ID: Denies sick contacts, exotic exposures, travel  Objective: Vitals:   10/01/17 1359 10/01/17 2140 10/02/17 0355 10/02/17 1047  BP: 114/86 118/83 105/77 119/76  Pulse: 88 66 84 67  Resp: 19 18 16 15   Temp: 97.8 F (36.6 C) 98.1 F (36.7 C) 98.5 F (36.9 C) 97.9 F (36.6 C)  TempSrc:  Oral Oral Oral  SpO2: 99% 100% 98% 99%  Weight:   54.2 kg (119 lb 8 oz)   Height:        Intake/Output  Summary (Last 24 hours) at 10/02/2017 1130 Last data filed at 10/02/2017 0318 Gross per 24 hour  Intake 300 ml  Output -  Net 300 ml   Filed Weights   09/29/17 0500 10/01/17 0500 10/02/17 0355  Weight: 54.5 kg (120 lb 2.4 oz) 55.7 kg (122 lb 12.7 oz) 54.2 kg (119 lb 8 oz)    Examination:  General exam: Appears calm and comfortable  Respiratory system: Clear to auscultation. Respiratory effort normal. Cardiovascular system: S1 & S2 heard, RRR. No JVD, murmurs, rubs, gallops or clicks. No pedal edema. Gastrointestinal system: Abdomen is nondistended, soft and nuy slightly tender to deep palpation. No organomegaly or masses felt. Normal bowel sounds heard. Central nervous system: Alert and oriented. No focal neurological deficits. Extremities: Symmetric 5 x 5 power. Skin: No rashes, lesions or ulcers Psychiatry: Judgement and insight appear normal. Mood & affect appropriate.     Data Reviewed:   CBC: Recent Labs  Lab 09/27/17 0702 09/30/17 0708 10/02/17 0348  WBC 8.5 9.8 5.5  NEUTROABS 6.2  --  3.2  HGB 14.7 14.8 13.4  HCT 43.2 44.9 41.6  MCV 95.4 95.1 97.0  PLT 465* 501* 239*   Basic Metabolic Panel: Recent Labs  Lab 09/27/17 0702 09/30/17 0708 10/02/17 0348  NA 137 139 139  K 3.5 4.2 3.8  CL 98* 103 103  CO2 27 29 26   GLUCOSE 88 102* 83  BUN <5* 7 5*  CREATININE 0.93 0.93 0.83  CALCIUM 8.7* 8.7* 8.5*  MG 1.7  --  1.6*   GFR: Estimated Creatinine Clearance: 86.2 mL/min (by C-G formula based on SCr of 0.83 mg/dL). Liver Function Tests: Recent Labs  Lab 09/27/17 0702 10/02/17 0348  AST 18 33  ALT 20 18  ALKPHOS 174* 123  BILITOT 0.9 0.3  PROT 6.4* 5.2*  ALBUMIN 2.9* 2.4*   No results for input(s): LIPASE, AMYLASE in the last 168 hours. No results for input(s): AMMONIA in the last 168 hours. Coagulation Profile: Recent Labs  Lab 09/27/17 0702  INR 1.21   Cardiac Enzymes: No results for input(s): CKTOTAL, CKMB, CKMBINDEX, TROPONINI in the last  168 hours. BNP (last 3 results) No results for input(s): PROBNP in the last 8760 hours. HbA1C: No results for input(s): HGBA1C in the last 72 hours. CBG: Recent Labs  Lab 10/01/17 0751 10/01/17 1659 10/02/17 0018 10/02/17 0747 10/02/17 0907  GLUCAP 90 85 83 74 86   Lipid Profile: No results for input(s): CHOL, HDL, LDLCALC, TRIG, CHOLHDL, LDLDIRECT in the last 72 hours. Thyroid Function Tests: No results for input(s): TSH, T4TOTAL, FREET4, T3FREE, THYROIDAB in the last 72 hours. Anemia Panel: No results for input(s): VITAMINB12, FOLATE, FERRITIN, TIBC, IRON, RETICCTPCT in the last 72 hours. Sepsis Labs: Recent Labs  Lab 09/27/17 5320 09/30/17 0708  LATICACIDVEN 0.9 0.5    Recent Results (from the past 240 hour(s))  Urine culture     Status: None   Collection Time: 09/24/17  1:22 AM  Result Value Ref Range Status   Specimen Description URINE, RANDOM  Final   Special Requests STERILE CONTAINER SENT  Final   Culture  Final    NO GROWTH Performed at [facility], [place] [address], [place], [place] [address]    Report Status 09/25/2017 FINAL  Final  Body fluid culture     Status: None   Collection Time: 09/24/17 12:00 PM  Result Value Ref Range Status   Specimen Description PERITONEAL CAVITY  Final   Special Requests NONE  Final   Gram Stain   Final    RARE WBC PRESENT,BOTH PMN AND MONONUCLEAR NO ORGANISMS SEEN    Culture   Final    NO GROWTH 3 DAYS Performed at [facility], [place] [address], [place], [place] [address]    Report Status 09/27/2017 FINAL  Final  Acid Fast Smear (AFB)     Status: None   Collection Time: 09/28/17 10:36 AM  Result Value Ref Range Status   AFB Specimen Processing Concentration  Final   Acid Fast Smear Negative  Final    Comment: (NOTE) Performed At: [facility] [place], [place] [id number] Rush Farmer MD IT:[id number]    Source (AFB) PERITONEAL  Final    Comment: Performed at [facility], [place] [address], [place], [place] [address]  Gram stain     Status: None   Collection Time: 09/28/17 10:36 AM  Result Value Ref Range Status   Specimen Description PERITONEAL  Final   Special Requests NONE  Final   Gram Stain   Final    CYTOSPIN SMEAR WBC PRESENT, PREDOMINANTLY PMN NO ORGANISMS SEEN Performed at [facility], [place] [address] [place], [place] [address]    Report Status 09/28/2017 FINAL  Final  Culture, body fluid-bottle     Status: None (Preliminary result)   Collection Time: 09/28/17 10:36 AM  Result Value Ref Range Status   Specimen Description PERITONEAL  Final   Special Requests NONE  Final   Culture   Final    NO GROWTH 3 DAYS Performed at [facility] [address] [place], [place] [address]    Report Status PENDING  Incomplete         Radiology Studies: No results found.      Scheduled Meds: . [MAR Hold] feeding supplement (ENSURE ENLIVE)  237 mL Oral TID BM   Continuous Infusions: . [MAR Hold] cefTRIAXone (ROCEPHIN)  IV Stopped (10/01/17 1350)  . [MAR Hold] metronidazole 500 mg (10/02/17 0536)     LOS: 8 days    I have spent 35 minutes face to face with the patient and on the ward discussing the patients care, assessment, plan and disposition with other care givers. >50% of the time was devoted counseling the patient about the risks and benefits of treatment and coordinating care.     Ankit Arsenio Loader, MD [facility] Pager [phone]   If 7PM-7AM, please contact night-coverage [email or web address] Password [email or web address] 10/02/2017, 11:30 AM

## 2017-10-02 NOTE — Anesthesia Procedure Notes (Signed)
Procedure Name: MAC Date/Time: 10/02/2017 11:33 AM Performed by: Kyung Rudd, CRNA Pre-anesthesia Checklist: Patient identified, Emergency Drugs available, Suction available and Patient being monitored Patient Re-evaluated:Patient Re-evaluated prior to induction Oxygen Delivery Method: Simple face mask Induction Type: IV induction Placement Confirmation: positive ETCO2 Dental Injury: Teeth and Oropharynx as per pre-operative assessment

## 2017-10-02 NOTE — Anesthesia Postprocedure Evaluation (Signed)
Anesthesia Post Note  Patient: Derek Lloyd  Procedure(s) Performed: COLONOSCOPY WITH PROPOFOL (N/A ) POLYPECTOMY (N/A Rectum) SUBMUCOSAL INJECTION     Patient location during evaluation: PACU Anesthesia Type: MAC Level of consciousness: awake and alert Pain management: pain level controlled Vital Signs Assessment: post-procedure vital signs reviewed and stable Respiratory status: spontaneous breathing, nonlabored ventilation, respiratory function stable and patient connected to nasal cannula oxygen Cardiovascular status: stable and blood pressure returned to baseline Postop Assessment: no apparent nausea or vomiting Anesthetic complications: no    Last Vitals:  Vitals:   10/02/17 1234 10/02/17 1303  BP: 99/63 131/86  Pulse: 84 (!) 59  Resp: 11 18  Temp: 36.4 C (!) 36.4 C  SpO2: 100% 100%    Last Pain:  Vitals:   10/02/17 1518  TempSrc:   PainSc: 7                  Lular Letson DAVID

## 2017-10-02 NOTE — Progress Notes (Addendum)
Patient ID: Derek Lloyd, male   DOB: 1970/12/29, 47 y.o.   MRN: [id number]       Subjective: Patient grumpy because he had to do a colon prep and wasn't able to eat solid food.  Otherwise still states he's having abdominal discomfort.  Objective: Vital signs in last 24 hours: Temp:  [97.8 F (36.6 C)-98.5 F (36.9 C)] 98.5 F (36.9 C) (05/15 0355) Pulse Rate:  [66-88] 84 (05/15 0355) Resp:  [16-19] 16 (05/15 0355) BP: (105-118)/(77-86) 105/77 (05/15 0355) SpO2:  [98 %-100 %] 98 % (05/15 0355) Weight:  [54.2 kg (119 lb 8 oz)] (P) 54.2 kg (119 lb 8 oz) (05/15 0355) Last BM Date: 10/02/17  Intake/Output from previous day: 05/14 0701 - 05/15 0700 In: 300 [IV Piggyback:300] Out: -  Intake/Output this shift: No intake/output data recorded.  PE: Abd: soft, still with some distention, tenderness seems improved from yesterday. +BS Heart: regular Lungs: CTAB  Lab Results:  Recent Labs    09/30/17 0708 10/02/17 0348  WBC 9.8 5.5  HGB 14.8 13.4  HCT 44.9 41.6  PLT 501* 494*   BMET Recent Labs    09/30/17 0708 10/02/17 0348  NA 139 139  K 4.2 3.8  CL 103 103  CO2 29 26  GLUCOSE 102* 83  BUN 7 5*  CREATININE 0.93 0.83  CALCIUM 8.7* 8.5*   PT/INR No results for input(s): LABPROT, INR in the last 72 hours. CMP     Component Value Date/Time   NA 139 10/02/2017 0348   K 3.8 10/02/2017 0348   CL 103 10/02/2017 0348   CO2 26 10/02/2017 0348   GLUCOSE 83 10/02/2017 0348   BUN 5 (L) 10/02/2017 0348   CREATININE 0.83 10/02/2017 0348   CALCIUM 8.5 (L) 10/02/2017 0348   PROT 5.2 (L) 10/02/2017 0348   ALBUMIN 2.4 (L) 10/02/2017 0348   AST 33 10/02/2017 0348   ALT 18 10/02/2017 0348   ALKPHOS 123 10/02/2017 0348   BILITOT 0.3 10/02/2017 0348   GFRNONAA >60 10/02/2017 0348   GFRAA >60 10/02/2017 0348   Lipase     Component Value Date/Time   LIPASE 22 09/23/2017 1236       Studies/Results: No results found.  Anti-infectives: Anti-infectives (From  admission, onward)   Start     Dose/Rate Route Frequency Ordered Stop   09/25/17 1315  cefTRIAXone (ROCEPHIN) 2 g in sodium chloride 0.9 % 100 mL IVPB     2 g 200 mL/hr over 30 Minutes Intravenous Every 24 hours 09/25/17 1308     09/25/17 1230  metroNIDAZOLE (FLAGYL) IVPB 500 mg     500 mg 100 mL/hr over 60 Minutes Intravenous Every 8 hours 09/25/17 1213     09/24/17 1630  piperacillin-tazobactam (ZOSYN) IVPB 3.375 g  Status:  Discontinued     3.375 g 12.5 mL/hr over 240 Minutes Intravenous Every 8 hours 09/24/17 1622 09/25/17 1211   09/24/17 0800  cefTRIAXone (ROCEPHIN) 2 g in sodium chloride 0.9 % 100 mL IVPB  Status:  Discontinued     2 g 200 mL/hr over 30 Minutes Intravenous Every 24 hours 09/24/17 0733 09/24/17 1615       Assessment/Plan  Abdominal pain with terminal ileitis, ?crohn's disease/sigmod thickening/ascites -colonoscopy today.  Await results to make further recommendations or plans. -will follow   FEN - NPO for cscope VTE - SCDs ID - Rocephin/Flagyl  ADDENDUM: Colonoscopy results reviewed.  Sigmoid colon appears normal with no evidence of overt malignancy.  Multiple polyps removed.  TI unable to be intubated.  No surgical indications found within the colon.  We will defer to GI for further work-up, diagnosis, and management of his Crohn's disease. If further surgical problems arise, then please call back.    LOS: 8 days    Henreitta Cea , [facility] Surgery 10/02/2017, 10:08 AM Pager: [phone]

## 2017-10-02 NOTE — Interval H&P Note (Signed)
History and Physical Interval Note:  47/male with Crohn's disease with abnormal CT for diagnostic colonoscopy. 10/02/2017 10:54 AM  Derek Lloyd  has presented today for colonoscopy, with the diagnosis of abnormal CT of abdomen  The various methods of treatment have been discussed with the patient and family. After consideration of risks, benefits and other options for treatment, the patient has consented to  Procedure(s): COLONOSCOPY WITH PROPOFOL (N/A) as a surgical intervention .  The patient's history has been reviewed, patient examined, no change in status, stable for surgery.  I have reviewed the patient's chart and labs.  Questions were answered to the patient's satisfaction.     [name]

## 2017-10-02 NOTE — Anesthesia Preprocedure Evaluation (Addendum)
Anesthesia Evaluation  Patient identified by MRN, date of birth, ID band Patient awake    Reviewed: Allergy & Precautions, NPO status , Patient's Chart, lab work & pertinent test results  Airway Mallampati: I  TM Distance: >3 FB Neck ROM: Full    Dental  (+) Teeth Intact, Poor Dentition   Pulmonary former smoker,    Pulmonary exam normal        Cardiovascular Normal cardiovascular exam     Neuro/Psych Anxiety    GI/Hepatic   Endo/Other    Renal/GU      Musculoskeletal   Abdominal   Peds  Hematology   Anesthesia Other Findings   Reproductive/Obstetrics                           Anesthesia Physical Anesthesia Plan  ASA: III  Anesthesia Plan: MAC   Post-op Pain Management:    Induction: Intravenous  PONV Risk Score and Plan: 1 and Treatment may vary due to age or medical condition  Airway Management Planned: Simple Face Mask  Additional Equipment:   Intra-op Plan:   Post-operative Plan:   Informed Consent: I have reviewed the patients History and Physical, chart, labs and discussed the procedure including the risks, benefits and alternatives for the proposed anesthesia with the patient or authorized representative who has indicated his/her understanding and acceptance.     Plan Discussed with: CRNA and Surgeon  Anesthesia Plan Comments:         Anesthesia Quick Evaluation

## 2017-10-02 NOTE — Brief Op Note (Signed)
09/23/2017 - 10/02/2017  12:26 PM  PATIENT:  Derek Lloyd  47 y.o. male  PRE-OPERATIVE DIAGNOSIS:  abnormal CT of abdomen  POST-OPERATIVE DIAGNOSIS:  rectal, cecum, transverse, appendeseal oriface, hepatic flexure, transverse colon, descending colon  polyp bx with hot snare. appendieseal oriface polyp bx with forcept.  PROCEDURE:  Procedure(s): COLONOSCOPY WITH PROPOFOL (N/A) POLYPECTOMY (N/A)  SURGEON:  Surgeon(s) and Role:    * [name], MD - Primary  PHYSICIAN ASSISTANT: Presley Raddle, RN, Charolette Child, Tech  ASSISTANTS:  ANESTHESIA:   MAC  EBL:  Minimal   BLOOD ADMINISTERED:none  DRAINS: none   LOCAL MEDICATIONS USED:  NONE  SPECIMEN:  Biopsy / Limited Resection  DISPOSITION OF SPECIMEN:  PATHOLOGY  COUNTS:  YES  TOURNIQUET:  * No tourniquets in log *  DICTATION: .Dragon Dictation  PLAN OF CARE: Admit to inpatient   PATIENT DISPOSITION:  PACU - hemodynamically stable.   Delay start of Pharmacological VTE agent (>24hrs) due to surgical blood loss or risk of bleeding: no

## 2017-10-02 NOTE — Transfer of Care (Signed)
Immediate Anesthesia Transfer of Care Note  Patient: Derek Lloyd  Procedure(s) Performed: COLONOSCOPY WITH PROPOFOL (N/A ) POLYPECTOMY (N/A Rectum)  Patient Location: Endoscopy Unit  Anesthesia Type:MAC  Level of Consciousness: awake, alert  and oriented  Airway & Oxygen Therapy: Patient Spontanous Breathing and Patient connected to face mask oxygen  Post-op Assessment: Report given to RN, Post -op Vital signs reviewed and stable and Patient moving all extremities X 4  Post vital signs: Reviewed and stable  Last Vitals:  Vitals Value Taken Time  BP 99/63 10/02/2017 12:34 PM  Temp 36.4 C 10/02/2017 12:34 PM  Pulse 82 10/02/2017 12:36 PM  Resp 21 10/02/2017 12:36 PM  SpO2 100 % 10/02/2017 12:36 PM  Vitals shown include unvalidated device data.  Last Pain:  Vitals:   10/02/17 1234  TempSrc: Oral  PainSc: 0-No pain      Patients Stated Pain Goal: 3 (62/03/55 9741)  Complications: No apparent anesthesia complications

## 2017-10-02 NOTE — Op Note (Signed)
Colonoscopy performed for abnormal CAT scan Findings: Large external hemorrhoids noted. 15 mm semi-pedunculated rectal polyp, removed via snare polypectomy. 20 mm pedunculated sigmoid polyp, removed by snare polypectomy, site tattooed with [place] ink 2 mL. 8 mm polyp found in the cecum removed via snare polypectomy. 6 mm polyp found and appendiceal orifice, removed via cold biopsy forceps. 8 mm polyp found in hepatic flexure, removed by hot snare polypectomy. 12 mm polyp found in transverse colon removed by hot snare polypectomy. 9 mm polyp found in descending colon, removed by hot snare polypectomy.  Ileocecal valve appeared normal, biopsies taken for histology.  Mucosa of the entire colon otherwise appeared unremarkable. Due to significant looping unable to intubate terminal ileum.  Recommendations: Advance diet as tolerated. Await pathology results. Repeat colonoscopy in 1 year for surveillance.  [name], M.D.

## 2017-10-02 NOTE — Op Note (Signed)
[facility] Patient Name: Derek Lloyd Procedure Date : 10/02/2017 MRN: [id number] Attending MD: [name] , MD Date of Birth: [birth year] CSN: [id number] Age: 47 Admit Type: Inpatient Procedure:                Colonoscopy Indications:              This is the patient's first colonoscopy, Abnormal                            CT of the GI tract Providers:                [name], MD, Presley Raddle, RN, Charolette Child,                            Technician, Rhae Lerner, CRNA Referring MD:              Medicines:                Monitored Anesthesia Care Complications:            No immediate complications. Estimated Blood Loss:     Estimated blood loss: none. Procedure:                Pre-Anesthesia Assessment:                           - Prior to the procedure, a History and Physical                            was performed, and patient medications and                            allergies were reviewed. The patient's tolerance of                            previous anesthesia was also reviewed. The risks                            and benefits of the procedure and the sedation                            options and risks were discussed with the patient.                            All questions were answered, and informed consent                            was obtained. Prior Anticoagulants: The patient has                            taken no previous anticoagulant or antiplatelet                            agents. ASA Grade Assessment: II - A patient with  mild systemic disease. After reviewing the risks                            and benefits, the patient was deemed in                            satisfactory condition to undergo the procedure.                           After obtaining informed consent, the colonoscope                            was passed under direct vision. Throughout the                            procedure, the  patient's blood pressure, pulse, and                            oxygen saturations were monitored continuously. The                            EC-[id number] ([id number]) scope was introduced through                            the anus and advanced to the the cecum, identified                            by appendiceal orifice and ileocecal valve. The                            colonoscopy was performed without difficulty. The                            patient tolerated the procedure well. The quality                            of the bowel preparation was fair. Scope In: 11:40:26 AM Scope Out: 12:22:59 PM Scope Withdrawal Time: 0 hours 34 minutes 29 seconds  Total Procedure Duration: 0 hours 42 minutes 33 seconds  Findings:      Large external hemorrhoids were found on perianal exam.      A 15 mm polyp was found in the rectum. The polyp was pedunculated. The       polyp was removed with a hot snare. Resection and retrieval were       complete.      A 20 mm polyp was found in the sigmoid colon. The polyp was       pedunculated. The polyp was removed with a hot snare. Resection and       retrieval were complete. Area was tattooed with an injection of 2 mL of       [place] ink.      A 8 mm polyp was found in the cecum. The polyp was sessile. The polyp       was removed with a hot snare. Resection and retrieval were complete.  A 6 mm polyp was found in the appendiceal orifice. The polyp was       sessile. The polyp was removed with a piecemeal technique using a cold       biopsy forceps. Resection and retrieval were complete.      A 8 mm polyp was found in the hepatic flexure. The polyp was sessile.       The polyp was removed with a hot snare. Resection and retrieval were       complete.      A 12 mm polyp was found in the transverse colon. The polyp was sessile.       The polyp was removed with a hot snare. Resection and retrieval were       complete.      A 9 mm polyp was found in the  descending colon. The polyp was sessile.       The polyp was removed with a piecemeal technique using a hot snare.       Resection and retrieval were complete.      The ileocecal valve appeared normal. Biopsies were taken with a cold       forceps for histology.      The terminal ileum could not be intubated, biopsies taken from IC valve.      Non-bleeding internal hemorrhoids were found during retroflexion.      The exam was otherwise without abnormality. Impression:               - Preparation of the colon was fair.                           - Hemorrhoids found on perianal exam.                           - One 15 mm polyp in the rectum, removed with a hot                            snare. Resected and retrieved.                           - One 20 mm polyp in the sigmoid colon, removed                            with a hot snare. Resected and retrieved. Tattooed.                           - One 8 mm polyp in the cecum, removed with a hot                            snare. Resected and retrieved.                           - One 6 mm polyp at the appendiceal orifice,                            removed piecemeal using a cold biopsy forceps.  Resected and retrieved.                           - One 8 mm polyp at the hepatic flexure, removed                            with a hot snare. Resected and retrieved.                           - One 12 mm polyp in the transverse colon, removed                            with a hot snare. Resected and retrieved.                           - One 9 mm polyp in the descending colon, removed                            piecemeal using a hot snare. Resected and retrieved.                           - The ileocecal valve is normal. Biopsied.                           - Non-bleeding internal hemorrhoids.                           - The examination was otherwise normal. Moderate Sedation:      Patient did not receive moderate sedation for  this procedure, but       instead received monitored anesthesia care. Recommendation:           - Advance diet as tolerated.                           - Continue present medications.                           - Await pathology results.                           - Repeat colonoscopy in 1 year for surveillance. Procedure Code(s):        --- Professional ---                           [phone], Colonoscopy, flexible; with removal of                            tumor(s), polyp(s), or other lesion(s) by snare                            technique                           04599, Colonoscopy, flexible; with directed  submucosal injection(s), any substance                           45380, 45, Colonoscopy, flexible; with biopsy,                            single or multiple Diagnosis Code(s):        --- Professional ---                           K64.8, Other hemorrhoids                           K62.1, Rectal polyp                           D12.5, Benign neoplasm of sigmoid colon                           D12.0, Benign neoplasm of cecum                           D12.1, Benign neoplasm of appendix                           D12.3, Benign neoplasm of transverse colon (hepatic                            flexure or splenic flexure)                           D12.4, Benign neoplasm of descending colon                           R93.3, Abnormal findings on diagnostic imaging of                            other parts of digestive tract CPT copyright 2017 [employer]. All rights reserved. The codes documented in this report are preliminary and upon coder review may  be revised to meet current compliance requirements. [name], MD 10/02/2017 12:37:38 PM This report has been signed electronically. Number of Addenda: 0

## 2017-10-02 NOTE — Interval H&P Note (Signed)
History and Physical Interval Note: 47/male with cecal and TI thickening and sigmoid thickening on CT abdomen for diagnostic colonoscopy.  10/02/2017 10:58 AM  Derek Lloyd  has presented today for colonoscopy, with the diagnosis of abnormal CT of abdomen  The various methods of treatment have been discussed with the patient and family. After consideration of risks, benefits and other options for treatment, the patient has consented to  Procedure(s): COLONOSCOPY WITH PROPOFOL (N/A) as a surgical intervention .  The patient's history has been reviewed, patient examined, no change in status, stable for surgery.  I have reviewed the patient's chart and labs.  Questions were answered to the patient's satisfaction.     [name]

## 2017-10-03 ENCOUNTER — Encounter (HOSPITAL_COMMUNITY): Payer: Self-pay | Admitting: Gastroenterology

## 2017-10-03 LAB — BASIC METABOLIC PANEL
Anion gap: 9 (ref 5–15)
BUN: 8 mg/dL (ref 6–20)
CHLORIDE: 102 mmol/L (ref 101–111)
CO2: 31 mmol/L (ref 22–32)
CREATININE: 0.9 mg/dL (ref 0.61–1.24)
Calcium: 8.6 mg/dL — ABNORMAL LOW (ref 8.9–10.3)
GFR calc Af Amer: >60 mL/min (ref >60)
GFR calc non Af Amer: >60 mL/min (ref >60)
GLUCOSE: 107 mg/dL — AB (ref 65–99)
POTASSIUM: 4.2 mmol/L (ref 3.5–5.1)
SODIUM: 142 mmol/L (ref 135–145)

## 2017-10-03 LAB — CBC
HEMATOCRIT: 40.2 % (ref 39.0–52.0)
HEMOGLOBIN: 13.3 g/dL (ref 13.0–17.0)
MCH: 31.6 pg (ref 26.0–34.0)
MCHC: 33.1 g/dL (ref 30.0–36.0)
MCV: 95.5 fL (ref 78.0–100.0)
Platelets: 601 10*3/uL — ABNORMAL HIGH (ref 150–400)
RBC: 4.21 MIL/uL — AB (ref 4.22–5.81)
RDW: 12.1 % (ref 11.5–15.5)
WBC: 7.1 10*3/uL (ref 4.0–10.5)

## 2017-10-03 LAB — GLUCOSE, CAPILLARY
Glucose-Capillary: 109 mg/dL — ABNORMAL HIGH (ref 65–99)
Glucose-Capillary: 146 mg/dL — ABNORMAL HIGH (ref 65–99)

## 2017-10-03 LAB — CULTURE, BODY FLUID W GRAM STAIN -BOTTLE: Culture: NO GROWTH

## 2017-10-03 LAB — CULTURE, BODY FLUID-BOTTLE

## 2017-10-03 LAB — MAGNESIUM: Magnesium: 1.8 mg/dL (ref 1.7–2.4)

## 2017-10-03 MED ORDER — CIPROFLOXACIN HCL 500 MG PO TABS: 500.0000 mg | ORAL_TABLET | Freq: Two times a day (BID) | ORAL | 0 refills | Status: AC

## 2017-10-03 MED ORDER — METRONIDAZOLE 250 MG PO TABS: 250.0000 mg | ORAL_TABLET | Freq: Three times a day (TID) | ORAL | 0 refills | Status: AC

## 2017-10-03 NOTE — Progress Notes (Signed)
    To whomever it may concern,  Patient, Derek Lloyd, was admitted to the hospital for medical reasons at [facility] from 09/23/2017 to 10/03/2017.     Ankit Arsenio Loader, MD [facility] Pager [phone]   If 7PM-7AM, please contact night-coverage [email or web address] Password [email or web address] 10/03/2017, 12:00 PM

## 2017-10-03 NOTE — Discharge Summary (Addendum)
Physician Discharge Summary  Derek Lloyd ESP:[id number] DOB: Jan 01, 1971 DOA: 09/23/2017  PCP: Patient, [name]  Admit date: 09/23/2017 Discharge date: 10/03/2017  Admitted From: Home  Disposition:  Home  Recommendations for Outpatient Follow-up:  1. Follow up with PCP in 1 weeks-information for [facility] given 2. Please obtain BMP/CBC in one week your next doctors visit.  3. Take oral ciprofloxacin and Flagyl as prescribed for 10 days 4. Needs outpatient CT of the chest with contrast.  Orders have been placed 5. Needs outpatient CT-guided biopsy as well.  Order has been placed 6. Needs to follow-up outpatient with gastroenterology as he may end up requiring CT enterography.  Home Health: None  Equipment/Devices: None  Discharge Condition: Stable CODE STATUS: Full  Diet recommendation:  Regular  Brief/Interim Summary: 47 year old with history of anxiety and chronic abdominal pain came to the hospital with complaints of abdominal pain.  On the CT he was found to have thickening of his sigmoid colon which appeared to be masslike along with omental caking.  There is also noted to have some ascites therefore underwent paracentesis-therapeutic and diagnostic.  Cultures and cytology were mostly unrevealing but he was started on Rocephin which was later switched to Rocephine and Flagyl.  There is suspicion for possible Crohn's due to terminal ileitis.  Gastroenterology, Dr Therisa Doyne performed colonoscopy on 10/02/2017 which showed multiple polyps which were removed and some were sent for pathology. The following day patient's symptoms had greatly improved and he was eager to be discharged and did not want to stay in the hospital any longer.  I personally discussed case with gastroenterology 9Dr Therisa Doyne), surgery Claiborne Billings PA) and interventional radiology (Dr Pascal Lux) about his colonoscopy results and need for omental caking biopsy.  It was recommended that patient would benefit from CT guided  biopsy by interventional radiology.  Radiologist also requested CT of the chest with contrast in case if he can find easier tissue to biopsy.  Patient adamantly refused to get this work-up done inpatient and wanted to follow-up with all of this outpatient.  Even though the need for diagnosis is very time sensitive which patient is aware, he can get this done outpatient.  I have placed orders for CT of the chest with contrast and CT-guided biopsy to be done within a week.  Gastroenterology to call the patient once the biopsy results are available. At this point patient is aware that most of his treatment plan will be dependent on the biopsy results.  He may also need CT enterography in the near future.  At this time he is tolerating diet, ambulating well in the hallway and has reached maximum benefit from an hospital stay.  He can follow-up outpatient with recommendations as stated above.  Given his condition and history of noncompliance, he is a very high risk for readmission  Discharge Diagnoses:  Active Problems:   Abdominal pain   Ascites   Acute on chronic abdominal pain improved Terminal ileitis concerning for Crohn's disease Sigmoid wall thickening concerning for colitis Ascites with spontaneous bacterial peritonitis -Patient has undergone paracentesis twice 5/7 and 5/11.  Initial WBC count was elevated which improved on repeat paracentesis.  Cytology and cultures are remain negative.  Initially on broad-spectrum antibiotic, IV Zosyn and now will be switched to oral Cipro and Flagyl to be taken for 10 days. -CT of the abdomen is concerning for mass and omental caking.  Colonoscopy showed multiple polyps which were removed and sent for biopsy.  It was recommended that  patient get home until taking biopsy therefore discussed with surgery who recommended to speak with interventional radiology.  I spoke with Dr. Pascal Lux, who recommended getting CT of the chest with contrast in case if there is an  easier tissue to biopsy otherwise we will get a CT-guided biopsy.  Patient adamantly refused to get this done inpatient and would like to have this done outpatient.  He understands although not acutely emergent it is extremely time sensitive to obtain diagnosis so we can proceed with further treatment plan. I will place the order for CT of the chest with contrast and CT-guided biopsy for it to be done outpatient within a week.  Patient is aware to follow this up. He is tolerating his diet well and does not have any complaints.  Subcu heparin while patient was here Full code   Discharge Instructions   Allergies as of 10/03/2017      Reactions   Asa [aspirin] Rash   Childhood reaction; but tolerates well now   Piperacillin-tazobactam In Dex Rash   Noted on May 2019 admission.  Tolerates Cephalosporins.      Medication List    STOP taking these medications   Garlic 4825 MG Caps   ibuprofen 200 MG tablet Commonly known as:  ADVIL,MOTRIN   OVER THE COUNTER MEDICATION   vitamin C 500 MG tablet Commonly known as:  ASCORBIC ACID     TAKE these medications   ciprofloxacin 500 MG tablet Commonly known as:  CIPRO Take 1 tablet (500 mg total) by mouth 2 (two) times daily for 10 days.   metroNIDAZOLE 250 MG tablet Commonly known as:  FLAGYL Take 1 tablet (250 mg total) by mouth 3 (three) times daily for 10 days.      Follow-up Information    [place] [facility] [place]. Schedule an appointment as soon as possible for a visit in 1 week(s).   Contact information: [place] [id number] [place]. Schedule an appointment as soon as possible for a visit in 1 day(s).   Contact information: [place] [address] [phone]        Gastroenterology, [place] [place]. Schedule an appointment as soon as possible for a visit in 2 week(s).   Contact information: [address] [place] [address] [phone]          Allergies  Allergen Reactions  . Asa [Aspirin] Rash    Childhood reaction; but tolerates well now  . Piperacillin-Tazobactam In Dex Rash    Noted on May 2019 admission.  Tolerates Cephalosporins.    You were cared for by a hospitalist during your hospital stay. If you have any questions about your discharge medications or the care you received while you were in the hospital after you are discharged, you can call the unit and asked to speak with the hospitalist on call if the hospitalist that took care of you is not available. Once you are discharged, your primary care physician will handle any further medical issues. Please note that no refills for any discharge medications will be authorized once you are discharged, as it is imperative that you return to your primary care physician (or establish a relationship with a primary care physician if you do not have one) for your aftercare needs so that they can reassess your need for medications and monitor your lab values.  Consultations:  Gastroenterology  General  surgery  Curbside-interventional radiology Dr Pascal Lux   Procedures/Studies: Ct Abdomen Pelvis W Contrast  Result Date: 09/26/2017 CLINICAL DATA:  Nausea/vomiting, dizziness EXAM: CT ABDOMEN AND PELVIS WITH CONTRAST TECHNIQUE: Multidetector CT imaging of the abdomen and pelvis was performed using the standard protocol following bolus administration of intravenous contrast. CONTRAST:  128m OMNIPAQUE IOHEXOL 300 MG/ML  SOLN COMPARISON:  09/24/2017 FINDINGS: Lower chest: Minimal dependent atelectasis bilaterally. Hepatobiliary: Liver is grossly unremarkable. Gallbladder is unremarkable. No intrahepatic or extrahepatic ductal dilatation. Pancreas: Within normal limits. Spleen: Spleen is normal in size. Adrenals/Urinary Tract: Adrenal glands are within normal limits. Kidneys are within normal limits.  No hydronephrosis. Bladder is  mildly thick-walled although underdistended. Stomach/Bowel: Stomach is within normal limits. Mildly prominent but nondilated loops of small bowel in the left mid abdomen, favoring adynamic ileus given the additional findings. Appendix is not discretely visualized. Mass-like thickening involving the sigmoid colon (series 3/image 63), possibly reflecting infectious/inflammatory colitis, although worrisome for colonic neoplasm given additional findings. Additional matted soft tissue in the right lower quadrant adjacent to the cecum and terminal ileum (series 3/image 61), possibly post inflammatory, although tumor not excluded. Vascular/Lymphatic: No evidence of abdominal aortic aneurysm. Atherosclerotic calcifications of the abdominal aorta and branch vessels. No suspicious abdominopelvic lymphadenopathy. Reproductive: Prostate is grossly unremarkable. Other: Moderate abdominal ascites, loculated with enhancing rim (series 3/image 38), suspicious for malignant ascites. Peritoneal disease/omental caking in the left upper abdomen along the splenic flexure (series 3/image 27). Musculoskeletal: Visualized osseous structures are within normal limits. IMPRESSION: Malignant ascites with peritoneal disease/omental caking, as described above. Mass-like thickening involving the sigmoid colon. Additional matted soft tissue in the right lower quadrant. While these findings may be infectious/inflammatory, tumor is not excluded. Consider colonoscopy for further evaluation. Mildly prominent loops of small bowel in the left mid abdomen, favoring adynamic ileus. Electronically Signed   By: SJulian HyM.D.   On: 09/26/2017 23:36   Ct Abdomen Pelvis W Contrast  Result Date: 09/24/2017 CLINICAL DATA:  Abdominal pain onset 2-3 months with nausea, vomiting and soft stringy stool. EXAM: CT ABDOMEN AND PELVIS WITH CONTRAST TECHNIQUE: Multidetector CT imaging of the abdomen and pelvis was performed using the standard protocol  following bolus administration of intravenous contrast. CONTRAST:  102mOMNIPAQUE IOHEXOL 300 MG/ML  SOLN COMPARISON:  None. FINDINGS: Lower chest: Normal size heart without pericardial effusion. Bibasilar atelectasis. Hepatobiliary: No space-occupying mass of the liver. Mild surface nodularity suggested that may reflect morphologic changes of cirrhosis. Gallbladder is physiologically distended without calculi. Pancreas: Normal Spleen: No splenomegaly. Adrenals/Urinary Tract: Adrenal glands are unremarkable. Kidneys are normal, without renal calculi, focal lesion, or hydronephrosis. Bladder is unremarkable. Stomach/Bowel: Centralized small bowel loops secondary to interval increase in moderate volume of ascites. Fluid-filled distention and thickening of small bowel measuring up to 3 cm in diameter is redemonstrated without transition point clearly identified. Some of this appearance may be sympathetic due to adjacent ascites. Distal ileal loops appears somewhat thickened, series 3/55 and may reflect stigmata of inflammatory bowel disease. Unremarkable colon. The appendix is unremarkable seen adjacent to the right iliac crest. Stomach is somewhat decompressed in appearance which may account for some of its transmural thickening. No discrete mass of the stomach identified. Vascular/Lymphatic: Aortic atherosclerosis. No enlarged abdominal or pelvic lymph nodes. Reproductive: Prostate is unremarkable. Other: As stated, interval increase in ascites.  No free air. Musculoskeletal: No acute or significant osseous findings. IMPRESSION: 1. Interval increase in moderate volume of ascites with centralized small bowel loops demonstrating fluid-filled distention up to  3 cm. Relative decompressed appearance of small bowel distally though there is suggestion of mild thickening in the region of the distal ileum. Changes of inflammatory bowel might account for this appearance. 2. Ascites may be attributable to cirrhotic change of  the liver given subtle surface nodularity. No splenomegaly is identified. Electronically Signed   By: Ashley Royalty M.D.   On: 09/24/2017 02:01   US Paracentesis  Result Date: 09/28/2017 INDICATION: Ascites EXAM: ULTRASOUND-GUIDED PARACENTESIS COMPARISON:  Previous paracentesis. MEDICATIONS: 10 cc 1% lidocaine. COMPLICATIONS: None immediate. TECHNIQUE: Informed written consent was obtained from the patient after a discussion of the risks, benefits and alternatives to treatment. A timeout was performed prior to the initiation of the procedure. Initial ultrasound scanning demonstrates a large amount of ascites within the right lower abdominal quadrant. The right lower abdomen was prepped and draped in the usual sterile fashion. 1% lidocaine with epinephrine was used for local anesthesia. Under direct ultrasound guidance, a 19 gauge, 7-cm, Yueh catheter was introduced. An ultrasound image was saved for documentation purposed. The paracentesis was performed. The catheter was removed and a dressing was applied. The patient tolerated the procedure well without immediate post procedural complication. FINDINGS: A total of approximately 1.1 liters of yellow fluid was removed. Samples were sent to the laboratory as requested by the clinical team. IMPRESSION: Successful ultrasound-guided paracentesis yielding 1.1 liters of peritoneal fluid. Read by Lavonia Drafts [facility] Electronically Signed   By: Corrie Mckusick D.O.   On: 09/28/2017 13:41   Dg Abdomen Acute W/chest  Result Date: 09/23/2017 CLINICAL DATA:  47 y/o  M; 2-3 months of abdominal pain. EXAM: DG ABDOMEN ACUTE W/ 1V CHEST COMPARISON:  08/15/2017 abdomen radiographs. FINDINGS: There is no evidence of dilated bowel loops or free intraperitoneal air. No radiopaque calculi or other significant radiographic abnormality is seen. Heart size and mediastinal contours are within normal limits. Both lungs are clear. IMPRESSION: Negative abdominal radiographs.  No acute  cardiopulmonary disease. Electronically Signed   By: Kristine Garbe M.D.   On: 09/23/2017 22:50   US Scrotum W/doppler  Result Date: 09/24/2017 CLINICAL DATA:  47 year old male with right testicular pain and tenderness. EXAM: SCROTAL ULTRASOUND DOPPLER ULTRASOUND OF THE TESTICLES TECHNIQUE: Complete ultrasound examination of the testicles, epididymis, and other scrotal structures was performed. Color and spectral Doppler ultrasound were also utilized to evaluate blood flow to the testicles. COMPARISON:  None. FINDINGS: Right testicle Measurements: 4.7 x 2.0 x 2.6 cm. No mass or microlithiasis visualized. Left testicle Measurements: 4.3 x 2.2 x 2.7 cm. No mass or microlithiasis visualized. Right epididymis:  Normal in size and appearance. Left epididymis:  Normal in size and appearance. Hydrocele:  None visualized. Varicocele:  None visualized. Pulsed Doppler interrogation of both testes demonstrates normal low resistance arterial and venous waveforms bilaterally. IMPRESSION: Unremarkable testicular ultrasound. Doppler detected arterial and venous flow to both testicles. Electronically Signed   By: Anner Crete M.D.   On: 09/24/2017 00:26   Ir Paracentesis  Result Date: 09/24/2017 INDICATION: Patient with history of abdominal pain, ascites, cirrhosis by imaging. Request made for diagnostic and therapeutic paracentesis. EXAM: ULTRASOUND GUIDED DIAGNOSTIC AND THERAPEUTIC PARACENTESIS MEDICATIONS: None COMPLICATIONS: None immediate. PROCEDURE: Informed written consent was obtained from the patient after a discussion of the risks, benefits and alternatives to treatment. A timeout was performed prior to the initiation of the procedure. Initial ultrasound scanning demonstrates a small amount of ascites within the right lower abdominal quadrant. The right lower abdomen was prepped and draped in the  usual sterile fashion. 2% lidocaine was used for local anesthesia. Following this, a 19 gauge, 7-cm, Yueh  catheter was introduced. An ultrasound image was saved for documentation purposes. The paracentesis was performed. The catheter was removed and a dressing was applied. The patient tolerated the procedure well without immediate post procedural complication. FINDINGS: A total of approximately 1.5 liters of hazy, amber fluid was removed. Samples were sent to the laboratory as requested by the clinical team. IMPRESSION: Successful ultrasound-guided diagnostic and therapeutic paracentesis yielding 1.5 liters of peritoneal fluid. Read by: Rowe Robert, PA-C Electronically Signed   By: Jerilynn Mages.  Shick M.D.   On: 09/24/2017 10:33      Subjective: No complaints this morning.  Tolerating oral diet and demands to get discharged.  HEENT/EYES = negative for pain, redness, loss of vision, double vision, blurred vision, loss of hearing, sore throat, hoarseness, dysphagia Cardiovascular= negative for chest pain, palpitation, murmurs, lower extremity swelling Respiratory/lungs= negative for shortness of breath, cough, hemoptysis, wheezing, mucus production Gastrointestinal= negative for nausea, vomiting,, abdominal pain, melena, hematemesis Genitourinary= negative for Dysuria, Hematuria, Change in Urinary Frequency MSK = Negative for arthralgia, myalgias, Back Pain, Joint swelling  Neurology= Negative for headache, seizures, numbness, tingling  Psychiatry= Negative for anxiety, depression, suicidal and homocidal ideation Allergy/Immunology= Medication/Food allergy as listed  Skin= Negative for Rash, lesions, ulcers, itching   Discharge Exam: Vitals:   10/02/17 2100 10/03/17 0500  BP: 106/66 105/74  Pulse: 83 73  Resp: 18 17  Temp: 98.2 F (36.8 C) 98.6 F (37 C)  SpO2: 98% 99%   Vitals:   10/02/17 1234 10/02/17 1303 10/02/17 2100 10/03/17 0500  BP: 99/63 131/86 106/66 105/74  Pulse: 84 (!) 59 83 73  Resp: 11 18 18 17   Temp: 97.6 F (36.4 C) (!) 97.5 F (36.4 C) 98.2 F (36.8 C) 98.6 F (37 C)   TempSrc: Oral Oral Oral Oral  SpO2: 100% 100% 98% 99%  Weight:      Height:        General: Pt is alert, awake, not in acute distress Cardiovascular: RRR, S1/S2 +, no rubs, no gallops Respiratory: CTA bilaterally, no wheezing, no rhonchi Abdominal: Soft, NT, ND, bowel sounds + Extremities: no edema, no cyanosis    The results of significant diagnostics from this hospitalization (including imaging, microbiology, ancillary and laboratory) are listed below for reference.     Microbiology: Recent Results (from the past 240 hour(s))  Urine culture     Status: None   Collection Time: 09/24/17  1:22 AM  Result Value Ref Range Status   Specimen Description URINE, RANDOM  Final   Special Requests STERILE CONTAINER SENT  Final   Culture   Final    NO GROWTH Performed at [facility], [address], [place], [place] [address]    Report Status 09/25/2017 FINAL  Final  Body fluid culture     Status: None   Collection Time: 09/24/17 12:00 PM  Result Value Ref Range Status   Specimen Description PERITONEAL CAVITY  Final   Special Requests NONE  Final   Gram Stain   Final    RARE WBC PRESENT,BOTH PMN AND MONONUCLEAR NO ORGANISMS SEEN    Culture   Final    NO GROWTH 3 DAYS Performed at [facility], [place] [address], [place], [place] [address]    Report Status 09/27/2017 FINAL  Final  Acid Fast Smear (AFB)     Status: None   Collection Time: 09/28/17 10:36 AM  Result Value Ref Range Status   AFB Specimen Processing Concentration  Final   Acid Fast Smear Negative  Final    Comment: (NOTE) Performed At: [facility] [place], [place] [id number] Rush Farmer MD [id number]    Source (AFB) PERITONEAL  Final    Comment: Performed at [facility], [place] [address], [place], [place] [address]  Gram stain     Status: None   Collection Time: 09/28/17 10:36 AM  Result Value Ref Range Status   Specimen Description PERITONEAL  Final   Special  Requests NONE  Final   Gram Stain   Final    CYTOSPIN SMEAR WBC PRESENT, PREDOMINANTLY PMN NO ORGANISMS SEEN Performed at [facility], [place] [address], [place], [place] [address]    Report Status 09/28/2017 FINAL  Final  Culture, body fluid-bottle     Status: None   Collection Time: 09/28/17 10:36 AM  Result Value Ref Range Status   Specimen Description PERITONEAL  Final   Special Requests NONE  Final   Culture   Final    NO GROWTH 5 DAYS Performed at [place] [address], [place], [place] [address]    Report Status 10/03/2017 FINAL  Final     Labs: BNP (last 3 results) Recent Labs    08/13/17 1754  BNP 58.5   Basic Metabolic Panel: Recent Labs  Lab 09/27/17 0702 09/30/17 0708 10/02/17 0348 10/03/17 0515  NA 137 139 139 142  K 3.5 4.2 3.8 4.2  CL 98* 103 103 102  CO2 27 29 26 31   GLUCOSE 88 102* 83 107*  BUN <5* 7 5* 8  CREATININE 0.93 0.93 0.83 0.90  CALCIUM 8.7* 8.7* 8.5* 8.6*  MG 1.7  --  1.6* 1.8   Liver Function Tests: Recent Labs  Lab 09/27/17 0702 10/02/17 0348  AST 18 33  ALT 20 18  ALKPHOS 174* 123  BILITOT 0.9 0.3  PROT 6.4* 5.2*  ALBUMIN 2.9* 2.4*   No results for input(s): LIPASE, AMYLASE in the last 168 hours. No results for input(s): AMMONIA in the last 168 hours. CBC: Recent Labs  Lab 09/27/17 0702 09/30/17 0708 10/02/17 0348 10/03/17 0515  WBC 8.5 9.8 5.5 7.1  NEUTROABS 6.2  --  3.2  --   HGB 14.7 14.8 13.4 13.3  HCT 43.2 44.9 41.6 40.2  MCV 95.4 95.1 97.0 95.5  PLT 465* 501* 494* 601*   Cardiac Enzymes: No results for input(s): CKTOTAL, CKMB, CKMBINDEX, TROPONINI in the last 168 hours. BNP: Invalid input(s): POCBNP CBG: Recent Labs  Lab 10/02/17 0907 10/02/17 1305 10/02/17 1611 10/03/17 0024 10/03/17 0802  GLUCAP 86 75 113* 146* 109*   D-Dimer No results for input(s): DDIMER in the last 72 hours. Hgb A1c No results for input(s): HGBA1C in the last 72 hours. Lipid Profile No results for  input(s): CHOL, HDL, LDLCALC, TRIG, CHOLHDL, LDLDIRECT in the last 72 hours. Thyroid function studies No results for input(s): TSH, T4TOTAL, T3FREE, THYROIDAB in the last 72 hours.  Invalid input(s): FREET3 Anemia work up No results for input(s): VITAMINB12, FOLATE, FERRITIN, TIBC, IRON, RETICCTPCT in the last 72 hours. Urinalysis    Component Value Date/Time   COLORURINE YELLOW 09/24/2017 0122   APPEARANCEUR CLEAR 09/24/2017 0122   LABSPEC >1.046 (H) 09/24/2017 0122   PHURINE 5.0 09/24/2017 0122   GLUCOSEU NEGATIVE 09/24/2017 0122   HGBUR NEGATIVE 09/24/2017 0122   BILIRUBINUR NEGATIVE 09/24/2017 0122   KETONESUR 20 (A)  09/24/2017 0122   PROTEINUR NEGATIVE 09/24/2017 0122   UROBILINOGEN 1.0 11/13/2014 1800   NITRITE NEGATIVE 09/24/2017 0122   LEUKOCYTESUR NEGATIVE 09/24/2017 0122   Sepsis Labs Invalid input(s): PROCALCITONIN,  WBC,  LACTICIDVEN Microbiology Recent Results (from the past 240 hour(s))  Urine culture     Status: None   Collection Time: 09/24/17  1:22 AM  Result Value Ref Range Status   Specimen Description URINE, RANDOM  Final   Special Requests STERILE CONTAINER SENT  Final   Culture   Final    NO GROWTH Performed at [facility], [address], [place], [place] [address]    Report Status 09/25/2017 FINAL  Final  Body fluid culture     Status: None   Collection Time: 09/24/17 12:00 PM  Result Value Ref Range Status   Specimen Description PERITONEAL CAVITY  Final   Special Requests NONE  Final   Gram Stain   Final    RARE WBC PRESENT,BOTH PMN AND MONONUCLEAR NO ORGANISMS SEEN    Culture   Final    NO GROWTH 3 DAYS Performed at [facility], [place] [address] [place], [place] [address]    Report Status 09/27/2017 FINAL  Final  Acid Fast Smear (AFB)     Status: None   Collection Time: 09/28/17 10:36 AM  Result Value Ref Range Status   AFB Specimen Processing Concentration  Final   Acid Fast Smear Negative  Final    Comment:  (NOTE) Performed At: [facility] [place], [place] [id number] Rush Farmer MD [id number]    Source (AFB) PERITONEAL  Final    Comment: Performed at [facility], [place] [address], [place], [place] [address]  Gram stain     Status: None   Collection Time: 09/28/17 10:36 AM  Result Value Ref Range Status   Specimen Description PERITONEAL  Final   Special Requests NONE  Final   Gram Stain   Final    CYTOSPIN SMEAR WBC PRESENT, PREDOMINANTLY PMN NO ORGANISMS SEEN Performed at [facility], [place] [address] [place], [place] [address]    Report Status 09/28/2017 FINAL  Final  Culture, body fluid-bottle     Status: None   Collection Time: 09/28/17 10:36 AM  Result Value Ref Range Status   Specimen Description PERITONEAL  Final   Special Requests NONE  Final   Culture   Final    NO GROWTH 5 DAYS Performed at [facility] [place] [address], [place], [place] [address]    Report Status 10/03/2017 FINAL  Final     Time coordinating discharge:  I have spent 35 minutes face to face with the patient and on the ward discussing the patients care, assessment, plan and disposition with other care givers. >50% of the time was devoted counseling the patient about the risks and benefits of treatment/Discharge disposition and coordinating care.   SIGNED:   Damita Lack, MD  [facility] 10/03/2017, 11:49 AM Pager   If 7PM-7AM, please contact night-coverage [email or web address] Password [email or web address]

## 2017-10-03 NOTE — Progress Notes (Signed)
Pt given discharge instructions, prescriptions, and care notes. Pt verbalized understanding AEB no further questions or concerns at this time. IV was discontinued, no redness, pain, or swelling noted at this time. Pt left the floor in stable condition.

## 2017-10-03 NOTE — Progress Notes (Addendum)
Colonoscopy yesterday was not suggestive of Crohn's colitis. Although patient had multiple polyps underlying colonic mucosa appeared unremarkable. I was not able to advance the scope into the terminal ileum, managed to take biopsies from the ileocecal valve and possibly of the ileum through the ileocecal valve. Upon further discussion with the primary team and my GI partner who has seen this patient previously, it seems that patient has had this mass in the right lower quadrant,ongoing symptoms for several months and omental thickening and secondary peritonitis.  CT from 01/19/17 showed an smooth thickening of distal and terminal ileum, normal distention and thickening of appendix. CT from 01/24/17 showed inflammatory changes and right lower quadrant, and abscess. Differential diagnosis included abscess secondary to perforated appendix, Meckel's diverticulitis, thickening of ascending colon from IBD and right lower quadrant bowel anatomy was not well defined. CT from 01/26/17 showed severe ileitis with large appendiceal abscess and possible enterocolonic fistula between abscesses and distal rectum. CT from 04/06/17 showed improvement in right lower quadrant abscess. CT from 08/13/17 showed diffuse small bowel dilatation consistent with obstruction with transition point in right lower quadrant for terminal ileum is decompressed and thick walled which may reflect ileitis. CT from 09/24/17 showed fluid-filled distention and thickening of the small bowel, unremarkable colon,ascites. CT from 09/26/17 showed mass like thickening in the sigmoid colon, malignant ascites with peritoneal disease and omental caking.   Given abnormal CAT scan since September of last year with fairly unremarkable colonoscopy except multiple polyps-recommend surgical evaluation for diagnostic laparoscopy and omental biopsy.  Discussed with patient's hospitalist Dr.Amin.   [name], M.D.

## 2017-10-08 DIAGNOSIS — C181 Malignant neoplasm of appendix: Secondary | ICD-10-CM

## 2017-10-08 NOTE — Progress Notes (Signed)
Received in basket message from Dr. Therisa Doyne referring patient to medical oncology. Referral order placed. CT Biopsy Omentum and CT Chest were ordered at [facility] while inpatient. I called Central Scheduling and they have left message with patient to call back so that they can give him a time and date. I called pt and left VM asking him to call central scheduling back and to call me so that I can offer guidance. Plan: Will attempt to reach patient so that we can get him in for initial consult with Dr. Benay Spice.

## 2017-10-09 ENCOUNTER — Telehealth: Payer: Self-pay

## 2017-10-09 NOTE — Progress Notes (Signed)
  Oncology Nurse Navigator Documentation  Navigator Location: [facility] (10/09/17 1458)   )Navigator Encounter Type: Telephone (10/09/17 1458) Telephone: Incoming Call (10/09/17 1458)  Patient returned my phone call.   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 He uses public transportation. I explained the role of navigator and explained the different members of his treatment team. Patient was discharged from [facility] on 10/03/17 without any pain medications and states that he is hurting.  Patient states that "I don't have anyone in my family with colon cancer and that is what scares me." I will call patient in the AM with a date and time for his consult after I speak with Dr. Benay Spice.                        Interventions: Coordination of Care (10/09/17 1458)            Acuity: Level 3 (10/09/17 1458)         Time Spent with Patient: 30 (10/09/17 1458)

## 2017-10-09 NOTE — Progress Notes (Signed)
  Oncology Nurse Navigator Documentation  Navigator Location: [facility] (10/09/17 1009)   )Navigator Encounter Type: Telephone;Letter/Fax/Email (10/09/17 1009) Telephone: Outgoing Call (10/09/17 1009)Called patient twice today and e-mailed patient requesting a call back to schedule initial consult. Awaiting call back from pt.                           Interventions: Coordination of Care (10/09/17 1009)            Acuity: Level 3 (10/09/17 1009)         Time Spent with Patient: 15 (10/09/17 1009)

## 2017-10-09 NOTE — Telephone Encounter (Signed)
Called pt to give him apt time/date/location for Friday 10/11/17 @ 1:45 with Dr. Benay Spice. Patient knows to arrive at 1:15 PM.

## 2017-10-11 ENCOUNTER — Telehealth: Payer: Self-pay | Admitting: Oncology

## 2017-10-11 ENCOUNTER — Inpatient Hospital Stay: Payer: BLUE CROSS/BLUE SHIELD | Attending: Oncology | Admitting: Oncology

## 2017-10-11 ENCOUNTER — Encounter: Payer: Self-pay | Admitting: Oncology

## 2017-10-11 VITALS — BP 101/76 | HR 107 | Temp 97.5°F | Resp 18 | Ht 70.0 in | Wt 121.4 lb

## 2017-10-11 DIAGNOSIS — C786 Secondary malignant neoplasm of retroperitoneum and peritoneum: Secondary | ICD-10-CM

## 2017-10-11 DIAGNOSIS — Z8042 Family history of malignant neoplasm of prostate: Secondary | ICD-10-CM

## 2017-10-11 DIAGNOSIS — D374 Neoplasm of uncertain behavior of colon: Secondary | ICD-10-CM | POA: Diagnosis not present

## 2017-10-11 DIAGNOSIS — C801 Malignant (primary) neoplasm, unspecified: Secondary | ICD-10-CM | POA: Diagnosis not present

## 2017-10-11 DIAGNOSIS — G893 Neoplasm related pain (acute) (chronic): Secondary | ICD-10-CM

## 2017-10-11 DIAGNOSIS — F17210 Nicotine dependence, cigarettes, uncomplicated: Secondary | ICD-10-CM

## 2017-10-11 DIAGNOSIS — D373 Neoplasm of uncertain behavior of appendix: Secondary | ICD-10-CM | POA: Diagnosis not present

## 2017-10-11 DIAGNOSIS — C800 Disseminated malignant neoplasm, unspecified: Secondary | ICD-10-CM

## 2017-10-11 MED ORDER — HYDROCODONE-ACETAMINOPHEN 5-325 MG PO TABS: 1.0000 | ORAL_TABLET | ORAL | 0 refills | Status: DC | PRN

## 2017-10-11 NOTE — Telephone Encounter (Signed)
Appointments scheduled AVS/Calendar printed per 5/24 los

## 2017-10-11 NOTE — Progress Notes (Signed)
Derek Lloyd   Referring VE:LFYB Derek Lloyd 47 y.o.  1970/07/03    Reason for Referral: Metastatic carcinoma   HPI: Derek Lloyd reports abdominal pain beginning in September 2019.  He was admitted in September 2018 with abdominal pain and vomiting.  A CT 01/26/2017 revealed a fluid-filled structure with rim enhancement in the right lower quadrant which was in the location of a dilated appendix noted on the CT January 19, 2017.  This was felt to represent an appendiceal abscess.  There was profound thickening of the terminal ileum with surrounding inflammatory change.  He was placed on antibiotics.  He left AMA prior to planned drainage of the right lower quadrant fluid collection.  He was readmitted on 08/13/2017 with abdominal pain and nausea/vomiting.  A CT of the abdomen revealed fluid-filled loops of dilated small bowel with wall thickening at the terminal ileum.  The appendix was not identified.  There is soft tissue thickening in the right lower quadrant extending into the pelvis for the rectum.  No residual right lower quadrant abscess.  He was seen in consultation by gastroenterology.  He was treated with antibiotics and steroids.  He left AMA on 08/15/2017.  Derek Lloyd was readmitted on 09/24/2017 with abdominal pain.  A CT of the abdomen revealed centralized small bowel loops secondary to increased ascites.  Fluid-filled distention and thickening of the small bowel was noted without a clear transition point.  The appendix appeared unremarkable.  There is mild surface nodularity of the liver. He underwent a paracentesis for 1.5 L of fluid on 09/24/2017.  A repeat CT on 09/26/2017 revealed masslike thickening at the sigmoid colon, matted soft tissue was noted in the right lower quadrant adjacent to the cecum and terminal ileum, moderate volume ascites, and omental caking in the left upper abdomen.  The peritoneal fluid cytology returned negative.  A  repeat sample of peritoneal fluid on 09/28/2017 again had inflammation and no malignant cells.  Dr. Therisa Doyne was consulted and he was taken to a colonoscopy procedure on 10/02/2017.  Multiple polyps were removed including polyps from the rectum, sigmoid colon, cecum, hepatic flexure, transverse colon, and descending colon.  A 6 mm polyp was noted at the appendiceal orifice.  The polyp was removed. The pathology ([id number]) revealed multiple tubular adenomas without high-grade dysplasia.  The biopsy from the appendiceal orifice polyp returned as invasive adenocarcinoma.  Tumor cells were noted beneath the mucosa and felt to represent extension from an adjacent site.    Past Medical History:  Diagnosis Date  .  History of ear infections   . Anxiety attack   . Headache    "frequently" (08/14/2017)  . Heart murmur as an infant   . Scoliosis  . Small bowel obstruction ([place]) 08/13/2017    Past Surgical History:  Procedure Laterality Date  . BIOPSY  10/02/2017   Procedure: BIOPSY;  Surgeon: [name], MD;  Location: [facility] ENDOSCOPY;  Service: Gastroenterology;;  . COLONOSCOPY WITH PROPOFOL N/A 10/02/2017   Procedure: COLONOSCOPY WITH PROPOFOL;  Surgeon: [name], MD;  Location: [place];  Service: Gastroenterology;  Laterality: N/A;  . FINGER SURGERY Left    "thumb"  . IR PARACENTESIS  09/24/2017  . POLYPECTOMY N/A 10/02/2017   Procedure: POLYPECTOMY;  Surgeon: [name], MD;  Location: [place];  Service: Gastroenterology;  Laterality: N/A;  . SUBMUCOSAL INJECTION  10/02/2017   Procedure: SUBMUCOSAL INJECTION;  Surgeon: [name], MD;  Location: [place];  Service: Gastroenterology;;  .  TYMPANOSTOMY TUBE PLACEMENT Bilateral     Medications: Reviewed  Allergies:  Allergies  Allergen Reactions  . Asa [Aspirin] Rash    Childhood reaction; but tolerates well now  . Piperacillin-Tazobactam In Dex Rash    Noted on May 2019 admission.  Tolerates Cephalosporins.    Family  history: His paternal grandfather had prostate cancer.  No other family history of cancer.  He has 1 full sister and one half sister.  Social History:   He lives with a roommate in [place].  He is a Ship broker at [employer].  He smokes cigarettes.  He reports moderate alcohol use.  No transfusion history.  No risk factor for HIV or hepatitis.  ROS:   Positives include: Postprandial bloating, "reflux ", abdominal pain when he takes a deep breath, swelling of the abdomen, pain in the right testicle and tenderness at the base of the right scrotum, chronic right hip "popping "  A complete ROS was otherwise negative.  Physical Exam:  Blood pressure 101/76, pulse (!) 107, temperature (!) 97.5 F (36.4 C), temperature source Oral, resp. rate 18, height _0  (1.778 m), weight 121 lb 6.4 oz (55.1 kg), SpO2 97 %.  HEENT: Oropharynx without visible mass, neck without mass Lungs: Clear bilaterally Cardiac: Regular rate and rhythm Abdomen: Distended, no mass, no hepatosplenomegaly GU: Testes without mass, tender at the right testicle and base of the right scrotum without a palpable mass Vascular: No leg edema Lymph nodes: No cervical or supraclavicular nodes.  "Shotty "bilateral axillary and inguinal nodes Neurologic: Alert and oriented, the motor exam appears intact in the upper and lower extremities Skin: No rash Musculoskeletal: No spine tenderness   LAB:  CBC  Lab Results  Component Value Date   WBC 7.1 10/03/2017   HGB 13.3 10/03/2017   HCT 40.2 10/03/2017   MCV 95.5 10/03/2017   PLT 601 (H) 10/03/2017   NEUTROABS 3.2 10/02/2017        CMP  Lab Results  Component Value Date   NA 142 10/03/2017   K 4.2 10/03/2017   CL 102 10/03/2017   CO2 31 10/03/2017   GLUCOSE 107 (H) 10/03/2017   BUN 8 10/03/2017   CREATININE 0.90 10/03/2017   CALCIUM 8.6 (L) 10/03/2017   PROT 5.2 (L) 10/02/2017   ALBUMIN 2.4 (L) 10/02/2017   AST 33 10/02/2017   ALT 18 10/02/2017   ALKPHOS  123 10/02/2017   BILITOT 0.3 10/02/2017   GFRNONAA >60 10/03/2017   GFRAA >60 10/03/2017      Imaging: CT images reviewed with Derek Lloyd   Assessment/Plan:   1. Metastatic adenocarcinoma  Biopsy of an appendiceal orifice "polyp "revealed invasive adenocarcinoma  CT abdomen/pelvis 09/26/2017 with evidence of carcinomatosis-omental caking and ascites  Colonoscopy 10/02/2017 with multiple polyps-tubular adenomas  2.   Abdominal pain and bloating secondary to #1   Disposition:   Derek Lloyd has been diagnosed with invasive adenocarcinoma.  There is clinical and CT evidence of carcinomatosis.  He has advanced stage disease.  The primary tumor site is unclear with the differential diagnosis including appendiceal or another gastrointestinal primary.  I discussed the case with Dr. Therisa Doyne.  She feels and appendix primary is unlikely.  Derek Lloyd is scheduled for biopsy of the omental caking and a staging chest CT.  We will refer him for placement of a Port-A-Cath.  I explained that no therapy will be curative.  Treatment will consist of systemic therapy .We can consider a referral to Dr. Clovis Riley for  cytoreductive surgery and intraperitoneal chemotherapy if he does not have metastatic disease in the chest.  We gave him a prescription for hydrocodone to use as needed for pain.  He will be referred to the cancer center nutritionist.  We will request Foundation 1 testing on the omental biopsy to look for evidence of a RAS mutation and microsatellite instability.  Derek Lloyd will return for an office visit and further discussion on 10/24/2017.  50 minutes were spent with the patient today.  The majority of the time was used for counseling and coordination of care.  Betsy Coder, MD  10/11/2017, 4:13 PM

## 2017-10-11 NOTE — Progress Notes (Signed)
  Oncology Nurse Navigator Documentation  Navigator Location: [facility] (10/11/17 1648)   )Navigator Encounter Type: Initial MedOnc (10/11/17 1648) Explained role of GI Navigator and educated on members of his tx team.                       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 Patient does have [employer] through [employer] as a Ship broker. Referral made to Social Work. Patient will also be referred to [facility]. Limited resources for food.   Education Method: Verbal (10/11/17 1648) Discussed and demonstrated Port-A-Cath access.with [employer]. Written information from [employer] provided on Colorectal Cancer treatment and staging. Pamphlet from [facility] provided.     Acuity: Level 3 (10/11/17 1648)         Time Spent with Patient: > 120 (10/11/17 1648)

## 2017-10-15 ENCOUNTER — Encounter: Payer: Self-pay | Admitting: General Practice

## 2017-10-15 ENCOUNTER — Ambulatory Visit (HOSPITAL_COMMUNITY): Payer: BLUE CROSS/BLUE SHIELD

## 2017-10-15 NOTE — Progress Notes (Signed)
[facility]ual Care Note  LVM of introduction as part of Support Team, per spiritual/emotional support referral from GI Navigator Dawn Placke/RN. Plan to f/u by phone if pt does not return call.   [place], [place], [facility] Pager [phone] Voicemail [phone]

## 2017-10-16 ENCOUNTER — Inpatient Hospital Stay: Payer: BLUE CROSS/BLUE SHIELD

## 2017-10-16 NOTE — Progress Notes (Deleted)
Patient ID: Derek Lloyd, male   DOB: Mar 04, 1971, 47 y.o.   MRN: [id number]    After hospitalization 5/06-5/16/2019 Recommendations for Outpatient Follow-up:  1. Follow up with PCP in 1 weeks-information for community wellness center given 2. Please obtain BMP/CBC in one week your next doctors visit.  3. Take oral ciprofloxacin and Flagyl as prescribed for 10 days 4. Needs outpatient CT of the chest with contrast.  Orders have been placed 5. Needs outpatient CT-guided biopsy as well.  Order has been placed 6. Needs to follow-up outpatient with gastroenterology as he may end up requiring CT enterography.  Home Health: None  Equipment/Devices: None  Discharge Condition: Stable CODE STATUS: Full  Diet recommendation:  Regular  Brief/Interim Summary: 47 year old with history of anxiety and chronic abdominal pain came to the hospital with complaints of abdominal pain. On the CT he was found to have thickening of his sigmoid colon which appeared to be masslike along with omental caking. There is also noted to have some ascites therefore underwent paracentesis-therapeutic and diagnostic. Cultures and cytology were mostly unrevealing but he was started on Rocephin which was later switched to Rocephineand Flagyl. There is suspicion for possible Crohn's due to terminal ileitis. Gastroenterology, Dr Therisa Doyne performed colonoscopy on 10/02/2017 which showed multiple polyps which were removed and some were sent for pathology. The following day patient's symptoms had greatly improved and he was eager to be discharged and did not want to stay in the hospital any longer.  I personally discussed case with gastroenterology 9Dr Therisa Doyne), surgery Claiborne Billings PA) and interventional radiology (Dr Pascal Lux) about his colonoscopy results and need for omental caking biopsy.  It was recommended that patient would benefit from CT guided biopsy by interventional radiology.  Radiologist also requested CT of the chest with contrast  in case if he can find easier tissue to biopsy.  Patient adamantly refused to get this work-up done inpatient and wanted to follow-up with all of this outpatient.  Even though the need for diagnosis is very time sensitive which patient is aware, he can get this done outpatient.  I have placed orders for CT of the chest with contrast and CT-guided biopsy to be done within a week.  Gastroenterology to call the patient once the biopsy results are available. At this point patient is aware that most of his treatment plan will be dependent on the biopsy results.  He may also need CT enterography in the near future.  At this time he is tolerating diet, ambulating well in the hallway and has reached maximum benefit from an hospital stay.  He can follow-up outpatient with recommendations as stated above.  Given his condition and history of noncompliance, he is a very high risk for readmission  Discharge Diagnoses:  Active Problems:   Abdominal pain   Ascites   Acute on chronic abdominal pain improved Terminal ileitis concerning for Crohn's disease Sigmoid wall thickening concerning for colitis Ascites with spontaneous bacterial peritonitis -Patient has undergone paracentesis twice 5/7 and 5/11. Initial WBC count was elevated which improved on repeat paracentesis. Cytology and cultures are remain negative. Initially on broad-spectrum antibiotic, IV Zosyn and now will be switched to oral Cipro and Flagyl to be taken for 10 days. -CT of the abdomen is concerning for mass and omental caking.  Colonoscopy showed multiple polyps which were removed and sent for biopsy.  It was recommended that patient get home until taking biopsy therefore discussed with surgery who recommended to speak with interventional radiology.  I spoke with  Dr. Pascal Lux, who recommended getting CT of the chest with contrast in case if there is an easier tissue to biopsy otherwise we will get a CT-guided biopsy.  Patient adamantly  refused to get this done inpatient and would like to have this done outpatient.  He understands although not acutely emergent it is extremely time sensitive to obtain diagnosis so we can proceed with further treatment plan. I will place the order for CT of the chest with contrast and CT-guided biopsy for it to be done outpatient within a week.  Patient is aware to follow this up. He is tolerating his diet well and does not have any complaints.

## 2017-10-17 ENCOUNTER — Encounter: Payer: Self-pay | Admitting: General Practice

## 2017-10-17 NOTE — Progress Notes (Signed)
[place] CSW Progress Notes  Call to patient to explore issues w food insecurity based on referral from nurse navigator.  Spoke w patient, states he has been turned down for Food Stamps due to being over income based on student grants.  However, patient reports that the funds from grants are to be used to pay for school expenses.  No other income available to him.  Made referral via [employer] to [employer] for emergency food assistance.  PAtient will be meeting w nutritionist on Monday - advised to ask for food pantry bag.  CSW will also leave information on local food banks and other sources of food.  CSW Dalene Seltzer advised to check in w patient as undersigned does not work on Monday.  Edwyna Shell, LCSW Clinical Social Worker Phone:  [phone]

## 2017-10-18 ENCOUNTER — Encounter: Payer: Self-pay | Admitting: Medical Oncology

## 2017-10-18 ENCOUNTER — Ambulatory Visit (HOSPITAL_COMMUNITY): Admission: RE | Admit: 2017-10-18 | Payer: BLUE CROSS/BLUE SHIELD | Source: Ambulatory Visit

## 2017-10-21 ENCOUNTER — Telehealth: Payer: Self-pay | Admitting: Medical Oncology

## 2017-10-21 ENCOUNTER — Other Ambulatory Visit: Payer: Self-pay | Admitting: Radiology

## 2017-10-21 ENCOUNTER — Inpatient Hospital Stay: Payer: BLUE CROSS/BLUE SHIELD | Admitting: Nutrition

## 2017-10-21 NOTE — Telephone Encounter (Signed)
[facility] Follow up call to patient regarding Dr. Gearldine Shown referral to study. Patient states that he does not remember Dr. Benay Spice discussing study with him and right now he has other issues he is more concerned about. Patient states that he is having transportation issues and will not be able to make his nutritional appointment this afternoon and may not be making his biopsy appointment tomorrow as well because he has "no one to come" with him to the appointment. Patient expresses frustration in the way he is feeling, states his abdomen "feels full and like it's going to explode" and his is reporting pain as well. I asked patient if it was alright for me to talk to Arna Snipe, nurse navigator, about his issues and concerns and he confirmed it was alright. Patient asked for an hour before [place] calls him, states he is walking to a friend/family's at the moment. I confirmed with him that I will talk to [facility] and have her follow up with him at his request. Patient thanked me. I encouraged him to call Dr. Benay Spice and clinic with questions or concerns. I spoke with Dawn and she confirms she will follow up with patient. Adele Dan, RN, BSN Clinical Research 10/21/2017 11:26 AM

## 2017-10-21 NOTE — Progress Notes (Signed)
Called pt to check in with him.  Patient  reported that he was discouraged and was planning on cancelling his [place] appointment on 10/22/17.  Patient does not have a friend or family member to go with him to the [place]. The friend who had agreed to accompany him is unable to do so. Patient reports that his ascites is getting worse and he has some numbness on one leg.   Patient is scheduled to see Ned Card on 10/24/17 at 11:15. He is also scheduled for the chemo class at 10 AM on 10/24/17. Patient states that it is hard for him to get to [facility] by 10 AM because of bowel issues. I will cancel the chemo education and nutrition appointment per his request. He stated "I have lots of questions". Patient encouraged to keep apt on 10/24/17. I will also ask Edwyna Shell LCSW if she can meet with patient on Thursday. Patient states that he does have a bus pass that he can use on Thursday.

## 2017-10-22 ENCOUNTER — Ambulatory Visit (HOSPITAL_COMMUNITY): Admission: RE | Admit: 2017-10-22 | Payer: BLUE CROSS/BLUE SHIELD | Source: Ambulatory Visit

## 2017-10-23 ENCOUNTER — Ambulatory Visit (HOSPITAL_COMMUNITY): Admission: RE | Admit: 2017-10-23 | Payer: BLUE CROSS/BLUE SHIELD | Source: Ambulatory Visit

## 2017-10-24 ENCOUNTER — Encounter: Payer: Self-pay | Admitting: Nurse Practitioner

## 2017-10-24 ENCOUNTER — Telehealth: Payer: Self-pay | Admitting: Oncology

## 2017-10-24 ENCOUNTER — Inpatient Hospital Stay: Payer: BLUE CROSS/BLUE SHIELD | Admitting: Nutrition

## 2017-10-24 ENCOUNTER — Inpatient Hospital Stay: Payer: BLUE CROSS/BLUE SHIELD | Attending: Nurse Practitioner | Admitting: Nurse Practitioner

## 2017-10-24 ENCOUNTER — Encounter: Payer: Self-pay | Admitting: General Practice

## 2017-10-24 ENCOUNTER — Other Ambulatory Visit: Payer: BLUE CROSS/BLUE SHIELD

## 2017-10-24 VITALS — BP 107/64 | HR 87 | Temp 98.1°F | Resp 18 | Ht 70.0 in | Wt 123.6 lb

## 2017-10-24 DIAGNOSIS — R140 Abdominal distension (gaseous): Secondary | ICD-10-CM

## 2017-10-24 DIAGNOSIS — Z7189 Other specified counseling: Secondary | ICD-10-CM

## 2017-10-24 DIAGNOSIS — C786 Secondary malignant neoplasm of retroperitoneum and peritoneum: Secondary | ICD-10-CM | POA: Diagnosis not present

## 2017-10-24 DIAGNOSIS — R109 Unspecified abdominal pain: Secondary | ICD-10-CM | POA: Insufficient documentation

## 2017-10-24 DIAGNOSIS — F419 Anxiety disorder, unspecified: Secondary | ICD-10-CM | POA: Diagnosis not present

## 2017-10-24 DIAGNOSIS — C801 Malignant (primary) neoplasm, unspecified: Secondary | ICD-10-CM | POA: Insufficient documentation

## 2017-10-24 DIAGNOSIS — C799 Secondary malignant neoplasm of unspecified site: Secondary | ICD-10-CM | POA: Insufficient documentation

## 2017-10-24 DIAGNOSIS — Z5111 Encounter for antineoplastic chemotherapy: Secondary | ICD-10-CM | POA: Diagnosis present

## 2017-10-24 DIAGNOSIS — R112 Nausea with vomiting, unspecified: Secondary | ICD-10-CM | POA: Diagnosis not present

## 2017-10-24 MED ORDER — ALPRAZOLAM 0.25 MG PO TABS: 0.2500 mg | ORAL_TABLET | Freq: Three times a day (TID) | ORAL | 0 refills | Status: DC | PRN

## 2017-10-24 MED ORDER — PROCHLORPERAZINE MALEATE 10 MG PO TABS: 10.0000 mg | ORAL_TABLET | Freq: Four times a day (QID) | ORAL | 0 refills | Status: DC | PRN

## 2017-10-24 MED ORDER — OXYCODONE HCL 5 MG PO TABS: 5.0000 mg | ORAL_TABLET | ORAL | 0 refills | Status: DC | PRN

## 2017-10-24 NOTE — Progress Notes (Signed)
START OFF PATHWAY REGIMEN - [Other Dx]   [id number] (q14d):   A cycle is every 14 days:     Oxaliplatin      Leucovorin      5-Fluorouracil      5-Fluorouracil   **Always confirm dose/schedule in your pharmacy ordering system**  Patient Characteristics: Intent of Therapy: Non-Curative / Palliative Intent, Discussed with Patient

## 2017-10-24 NOTE — Progress Notes (Addendum)
[facility] OFFICE PROGRESS NOTE   Diagnosis: Metastatic carcinoma  INTERVAL HISTORY:   Mr. Leather returns for follow-up.  He continues to have abdominal distention/pain.  Hydrocodone is not effective.  He noted temporary improvement after the most recent paracentesis.  He declines another paracentesis at this time.  He notes that the abdominal distention worsens after eating.  He describes this as pressure.  Periodic nausea/vomiting.  He has numbness and a burning sensation at the right thigh.  No leg weakness.  Objective:  Vital signs in last 24 hours:  Blood pressure 107/64, pulse 87, temperature 98.1 F (36.7 C), temperature source Oral, resp. rate 18, height 5' 10"  (1.778 m), weight 123 lb 9.6 oz (56.1 kg), SpO2 100 %.    HEENT: No thrush or ulcers. Resp: Lungs clear bilaterally. Cardio: Regular rate and rhythm. GI: Abdomen is distended consistent with ascites. Vascular: No leg edema. Neuro: Lower extremity motor strength 5/5.  Knee DTRs 2+, symmetric.    Lab Results:  Lab Results  Component Value Date   WBC 7.1 10/03/2017   HGB 13.3 10/03/2017   HCT 40.2 10/03/2017   MCV 95.5 10/03/2017   PLT 601 (H) 10/03/2017   NEUTROABS 3.2 10/02/2017    Imaging:  No results found.  Medications: I have reviewed the patient's current medications.  Assessment/Plan: 1. Metastatic adenocarcinoma ? 10/02/2017 biopsy of an appendiceal orifice "polyp" revealed invasive adenocarcinoma ? CT abdomen/pelvis 09/26/2017 with evidence of carcinomatosis-omental caking and ascites ? Colonoscopy 10/02/2017 with multiple polyps-tubular adenomas ? Paracentesis 09/24/2017, 09/28/2017-no malignant cells identified  2.   Abdominal pain and bloating secondary to #1     Disposition: Mr. Swing appears unchanged.  Biopsy and Port-A-Cath placement are scheduled 11/01/2017.  We reviewed at today's visit that he has stage IV cancer, likely GI primary.  He understands that no  therapy will be curative.  He understands the reason for the biopsy is to obtain additional tissue for further testing (foundation 1).  We are referring him back to Dr. Therisa Doyne to consider an upper endoscopy.  We are also making a referral to Dr. Clovis Riley at [facility] to evaluate for possible cytoreductive surgery in the future.  Dr. Benay Spice recommends systemic therapy on the FOLFOX regimen.  We reviewed potential toxicities associated with chemotherapy including bone marrow toxicity, hair loss, nausea, allergic reaction.  We discussed the potential toxicities associated with 5-fluorouracil including mouth sores, diarrhea, hand-foot syndrome, skin hyperpigmentation, skin rash, increased sensitivity to sun.  We reviewed the potential neuro toxicities associated with Oxaliplatin including cold sensitivity, peripheral neuropathy, acute laryngopharyngeal dysesthesia, more rare occurrences such as diplopia, ataxia, incontinence.  He agrees to proceed.  He will attend a chemotherapy education class.  He understands why a Port-A-Cath is necessary for this regimen.  Pain is poorly controlled.  He will discontinue hydrocodone and try oxycodone 5 mg 1 to 2 tablets every 4 hours as needed.  He also requested something for anxiety and was provided with a prescription for Xanax 0.25 mg every 8 hours as needed.  He understands he should not take these medications at the same time due to the potential for increased somnolence, respiratory depression.  He continues to have significant abdominal distention.  He declines referral for a paracentesis at this time.  In addition to the biopsy and Port-A-Cath placement scheduled on 11/01/2017, he is scheduled for a staging chest CT 10/28/2017.  He will return for lab, follow-up and cycle 1 FOLFOX on 11/04/2017.  He will contact the office  in the interim with any problems.  Patient seen with Dr. Benay Spice.  30 minutes were spent face-to-face at today's visit with the majority of that  time involved in counseling/coordination of care.    Ned Card ANP/GNP-BC   10/24/2017  11:51 AM  This was a shared visit with Ned Card.  We had a long discussion with Mr. Crickenberger regarding the diagnosis, prognosis, and treatment options.  He has been diagnosed with metastatic adenocarcinoma.  The primary tumor site has not been defined with the likely differential diagnosis including appendiceal cancer, small bowel carcinoma, or an upper gastrointestinal primary.  His case was presented at the GI tumor conference last week.  A diagnostic upper endoscopy is recommended.  We will make a referral to Dr. Therisa Doyne.  He has been rescheduled for Port-A-Cath placement and biopsy of an omental mass on 11/01/2017.  The plan is to begin FOLFOX chemotherapy on 11/04/2017.  I reviewed the potential toxicities associated with the FOLFOX regimen.  He agrees to proceed.  We will make referral to Dr. Clovis Riley to consider the indication for cytoreductive surgery and intraperitoneal chemotherapy.  Julieanne Manson, MD

## 2017-10-24 NOTE — Progress Notes (Signed)
[facility] CSW Progress Notes  CSW was consulted by nurse navigator and nutritionist for help w patient's psychosocial needs.  Met w patient in exam room.  Briefly explored resource needs, patient was feeling nauseous and also wanted to get home as soon as possible.  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  Has not worked for past two years, after being let go from last job.  Has couch surfed, currently living in apartment w friend who is enrolled in government program and not allowed to have roommates.  Patient is not on the lease.  Fears he will be put out on the street and "that's no place for someone w something like cancer."  No insurance, worried about ability to start treatment.  Difficulty getting food.  Was offered case of Ensure by nutritionist; however immediately became nauseous after drinking a small amount to test flavor.  Was offered transportation help but declined after learning that he could not pick up all his prescriptions at the pharmacy on the way home (some needed to be dropped off/filled and transport options cannot wait for prescriptions to be filled).  Patient preferred to take city bus home as he would be able to stop at pharmacy and get his pain medications filled.  Reports significant pain issue.  CSW discussed disability application process w patient, completed referral to [facility].  Also referred to [employer] [facility] [employer] housing program - this is unlikely to produce a housing voucher in the immediate future but patient can be placed on waiting list.  In order to have housing, patient will need to have income of some kind.  Prior to that time, patient's housing resources are limited to friends/family and shelter system.  Patient declined referral to [facility] for possible help,  states he has been there before and does not like that agency.  Encouraged patient to keep in touch re needs, was given CSW contact information.   , LCSW Clinical Social Worker Phone:  [phone]  

## 2017-10-24 NOTE — Progress Notes (Signed)
47 year old male diagnosed with metastatic carcinoma with unknown primary.  He is a patient of Dr. Benay Spice.  Past medical history includes anxiety, scoliosis, and SBO.  Medications were reviewed.  Labs include glucose 107 on May 16.  Height: 5 feet 10 inches. Weight: 123.6 pounds. Usual body weight: 140 pounds March 2019. BMI: 17.73.  Patient reports he is currently living with a friend but does not know how long this will last. He has access to refrigeration and cooking equipment. He has a poor appetite secondary to extreme nausea and vomiting. He can tolerate chicken noodle soup and vegetable soup. Reports he did well with strawberry Ensure but has not tried any other flavors. He rides a bus and does not want to carry samples with him.  Nutrition diagnosis: Severe malnutrition in the context of acute illness secondary to greater than 7.5% weight loss over 3 months, less than 50% energy intake for greater than equal to 5 days, and moderate to severe depletion of body fat and muscle mass.  Intervention: Educated patient to try to eat small amounts of food more often focusing on high-protein foods. Encourage patient to accept offer of vanilla Ensure Enlive secondary to financial insecurity. Patient is frustrated and angry during visit. Provided fact sheet on high-protein foods.  I offered patient additional samples which he refused.  Provided contact information.  Monitoring, evaluation, goals: Patient will tolerate increased calories and protein to minimize weight loss.  Next visit: To be scheduled as needed.  **Disclaimer: This note was dictated with voice recognition software. Similar sounding words can inadvertently be transcribed and this note may contain transcription errors which may not have been corrected upon publication of note.**

## 2017-10-24 NOTE — Telephone Encounter (Signed)
Appointment scheduled AVS/Calendar printed / info sent to HIM for referral per 6/6 los

## 2017-10-25 ENCOUNTER — Encounter: Payer: Self-pay | Admitting: General Practice

## 2017-10-25 NOTE — Progress Notes (Signed)
[facility] CSW Progress Note  Call to patient - [facility] will be in [place] on 6/13 at 12:30 to interview patient for disability application.  They request that patient be given Medicaid application - Financial Advocate asked to assist.  Chemo ed RN and nurse navigator both aware, spoke w patient by phone.  Edwyna Shell, LCSW Clinical Social Worker Phone:  [phone]

## 2017-10-28 ENCOUNTER — Ambulatory Visit (HOSPITAL_COMMUNITY): Payer: BLUE CROSS/BLUE SHIELD

## 2017-10-29 ENCOUNTER — Telehealth: Payer: Self-pay | Admitting: Oncology

## 2017-10-29 NOTE — Telephone Encounter (Signed)
Faxed records to Dr. Clovis Riley. Dr. Clovis Riley will review records.

## 2017-10-31 ENCOUNTER — Other Ambulatory Visit: Payer: BLUE CROSS/BLUE SHIELD

## 2017-10-31 ENCOUNTER — Telehealth: Payer: Self-pay | Admitting: Oncology

## 2017-10-31 ENCOUNTER — Other Ambulatory Visit: Payer: Self-pay | Admitting: Radiology

## 2017-10-31 ENCOUNTER — Encounter: Payer: Self-pay | Admitting: Pharmacist

## 2017-10-31 NOTE — Progress Notes (Signed)
  Oncology Nurse Navigator Documentation  Navigator Location: [facility] (10/31/17 1139)   )Navigator Encounter Type: Telephone (10/31/17 1139) Telephone: Outgoing Call (10/31/17 1139)    Pt called scheduling to cancel Chemo education class for today. I called pt and he had multiple barriers to tx that he identified. Pt has diarrhea and can't leave his house at present time. Limited access to transportation and no social support. Patient also struggling with "unfairness" of his social situation (not having stable housing and spending years in school without "something to show for it". Patient cancelled his Chemo ed-class for today as well as his meeting with [facility] representative to start disability paperwork. I spoke with Drema Dallas LCSW who gave me application for transport service as well as disposable briefs.. Called pt and I will meet him at [facility] IR in AM to have him sign paperwork for transportation service. Patient reminded to use Imodium for loose stools.                   Barriers/Navigation NeedsCommunity education officer;Financial (10/31/17 1139)   Interventions: Coordination of Care;Psycho-social support (10/31/17 1139)            Acuity: Level 3 (10/31/17 1139)     Acuity Level 3: Supply needs;Transportation issues;Emotional needs;Absence of support;Ongoing guidance and education provided throughout treatment (10/31/17 1139)   Time Spent with Patient: 60 (10/31/17 1139)

## 2017-10-31 NOTE — Telephone Encounter (Signed)
Pt called in to cancel chemo edu appt - cancelled per patient request.   Patient did not want to reschedule at the moment due to lack of transportation - He has a biopsy and port placement appt tomorrow that he is unsure he will make. Patient is aware this may delay treatment .I assured him that I will relay the message to Rn navigator and Social work to see if any assistance is available.    Rn [employer] aware.

## 2017-11-01 ENCOUNTER — Ambulatory Visit (HOSPITAL_COMMUNITY)
Admission: RE | Admit: 2017-11-01 | Discharge: 2017-11-01 | Disposition: A | Discharge status: Home / Self Care | Payer: BLUE CROSS/BLUE SHIELD | Source: Ambulatory Visit | Attending: Internal Medicine | Admitting: Internal Medicine

## 2017-11-01 ENCOUNTER — Other Ambulatory Visit: Payer: Self-pay | Admitting: Oncology

## 2017-11-01 ENCOUNTER — Other Ambulatory Visit: Payer: Self-pay | Admitting: Nurse Practitioner

## 2017-11-01 ENCOUNTER — Ambulatory Visit (HOSPITAL_COMMUNITY)
Admission: RE | Admit: 2017-11-01 | Discharge: 2017-11-01 | Disposition: A | Discharge status: Home / Self Care | Payer: BLUE CROSS/BLUE SHIELD | Source: Ambulatory Visit | Attending: Oncology | Admitting: Oncology

## 2017-11-01 ENCOUNTER — Encounter (HOSPITAL_COMMUNITY): Payer: Self-pay

## 2017-11-01 ENCOUNTER — Other Ambulatory Visit (HOSPITAL_COMMUNITY): Payer: Self-pay | Admitting: Interventional Radiology

## 2017-11-01 ENCOUNTER — Telehealth: Payer: Self-pay | Admitting: *Deleted

## 2017-11-01 DIAGNOSIS — Z886 Allergy status to analgesic agent status: Secondary | ICD-10-CM | POA: Insufficient documentation

## 2017-11-01 DIAGNOSIS — R079 Chest pain, unspecified: Secondary | ICD-10-CM

## 2017-11-01 DIAGNOSIS — R51 Headache: Secondary | ICD-10-CM | POA: Insufficient documentation

## 2017-11-01 DIAGNOSIS — C800 Disseminated malignant neoplasm, unspecified: Secondary | ICD-10-CM

## 2017-11-01 DIAGNOSIS — R188 Other ascites: Secondary | ICD-10-CM | POA: Diagnosis not present

## 2017-11-01 DIAGNOSIS — K7031 Alcoholic cirrhosis of liver with ascites: Secondary | ICD-10-CM

## 2017-11-01 DIAGNOSIS — Z800 Family history of malignant neoplasm of digestive organs: Secondary | ICD-10-CM | POA: Insufficient documentation

## 2017-11-01 DIAGNOSIS — F419 Anxiety disorder, unspecified: Secondary | ICD-10-CM | POA: Insufficient documentation

## 2017-11-01 DIAGNOSIS — R918 Other nonspecific abnormal finding of lung field: Secondary | ICD-10-CM | POA: Insufficient documentation

## 2017-11-01 DIAGNOSIS — Z8042 Family history of malignant neoplasm of prostate: Secondary | ICD-10-CM | POA: Insufficient documentation

## 2017-11-01 DIAGNOSIS — C799 Secondary malignant neoplasm of unspecified site: Secondary | ICD-10-CM

## 2017-11-01 DIAGNOSIS — R109 Unspecified abdominal pain: Secondary | ICD-10-CM

## 2017-11-01 DIAGNOSIS — F17210 Nicotine dependence, cigarettes, uncomplicated: Secondary | ICD-10-CM | POA: Diagnosis not present

## 2017-11-01 DIAGNOSIS — C786 Secondary malignant neoplasm of retroperitoneum and peritoneum: Secondary | ICD-10-CM | POA: Diagnosis not present

## 2017-11-01 DIAGNOSIS — C182 Malignant neoplasm of ascending colon: Secondary | ICD-10-CM | POA: Diagnosis present

## 2017-11-01 DIAGNOSIS — Z9889 Other specified postprocedural states: Secondary | ICD-10-CM | POA: Insufficient documentation

## 2017-11-01 DIAGNOSIS — J9811 Atelectasis: Secondary | ICD-10-CM | POA: Diagnosis not present

## 2017-11-01 HISTORY — PX: IR IMAGING GUIDED PORT INSERTION: IMG5740

## 2017-11-01 HISTORY — PX: IR US GUIDE VASC ACCESS RIGHT: IMG2390

## 2017-11-01 LAB — CBC
HEMATOCRIT: 49.4 % (ref 39.0–52.0)
HEMOGLOBIN: 16.2 g/dL (ref 13.0–17.0)
MCH: 30.6 pg (ref 26.0–34.0)
MCHC: 32.8 g/dL (ref 30.0–36.0)
MCV: 93.4 fL (ref 78.0–100.0)
Platelets: 574 10*3/uL — ABNORMAL HIGH (ref 150–400)
RBC: 5.29 MIL/uL (ref 4.22–5.81)
RDW: 12.2 % (ref 11.5–15.5)
WBC: 8.2 10*3/uL (ref 4.0–10.5)

## 2017-11-01 MED ORDER — HYDROCODONE-ACETAMINOPHEN 5-325 MG PO TABS
1.0000 | ORAL_TABLET | ORAL | Status: DC | PRN
  Filled 2017-11-01: qty 2

## 2017-11-01 MED ORDER — HEPARIN SOD (PORK) LOCK FLUSH 100 UNIT/ML IV SOLN
INTRAVENOUS | Status: AC
  Filled 2017-11-01: qty 5

## 2017-11-01 MED ORDER — FENTANYL CITRATE (PF) 100 MCG/2ML IJ SOLN
INTRAMUSCULAR | Status: AC
  Filled 2017-11-01: qty 4

## 2017-11-01 MED ORDER — IOHEXOL 300 MG/ML  SOLN
100.0000 mL | Freq: Once | INTRAMUSCULAR | Status: AC | PRN
  Administered 2017-11-01: 100 mL via INTRAVENOUS

## 2017-11-01 MED ORDER — MIDAZOLAM HCL 2 MG/2ML IJ SOLN
INTRAMUSCULAR | Status: AC
  Filled 2017-11-01: qty 4

## 2017-11-01 MED ORDER — OXYCODONE HCL 5 MG PO TABS: 5.0000 mg | ORAL_TABLET | ORAL | 0 refills | Status: DC | PRN

## 2017-11-01 MED ORDER — MIDAZOLAM HCL 2 MG/2ML IJ SOLN
INTRAMUSCULAR | Status: AC
  Filled 2017-11-01: qty 6

## 2017-11-01 MED ORDER — SODIUM CHLORIDE 0.9 % IV SOLN: INTRAVENOUS | Status: DC

## 2017-11-01 MED ORDER — VANCOMYCIN HCL IN DEXTROSE 1-5 GM/200ML-% IV SOLN
INTRAVENOUS | Status: AC
  Filled 2017-11-01: qty 200

## 2017-11-01 MED ORDER — VANCOMYCIN HCL IN DEXTROSE 1-5 GM/200ML-% IV SOLN
1000.0000 mg | INTRAVENOUS | Status: AC
  Administered 2017-11-01: 1000 mg via INTRAVENOUS

## 2017-11-01 MED ORDER — FENTANYL CITRATE (PF) 100 MCG/2ML IJ SOLN
INTRAMUSCULAR | Status: AC
  Filled 2017-11-01: qty 2

## 2017-11-01 MED ORDER — LIDOCAINE-EPINEPHRINE (PF) 1 %-1:200000 IJ SOLN
INTRAMUSCULAR | Status: DC | PRN
  Administered 2017-11-01: 20 mL

## 2017-11-01 MED ORDER — MIDAZOLAM HCL 2 MG/2ML IJ SOLN
INTRAMUSCULAR | Status: AC | PRN
  Administered 2017-11-01 (×2): 1 mg via INTRAVENOUS

## 2017-11-01 MED ORDER — LIDOCAINE HCL 1 % IJ SOLN
INTRAMUSCULAR | Status: AC
  Filled 2017-11-01: qty 20

## 2017-11-01 MED ORDER — FENTANYL CITRATE (PF) 100 MCG/2ML IJ SOLN
INTRAMUSCULAR | Status: AC | PRN
  Administered 2017-11-01: 50 ug via INTRAVENOUS

## 2017-11-01 MED ORDER — MIDAZOLAM HCL 2 MG/2ML IJ SOLN
INTRAMUSCULAR | Status: DC | PRN
  Administered 2017-11-01 (×2): 1 mg via INTRAVENOUS
  Administered 2017-11-01: 2 mg via INTRAVENOUS

## 2017-11-01 MED ORDER — LIDOCAINE-EPINEPHRINE (PF) 1 %-1:200000 IJ SOLN
INTRAMUSCULAR | Status: AC
  Filled 2017-11-01: qty 30

## 2017-11-01 MED ORDER — FENTANYL CITRATE (PF) 100 MCG/2ML IJ SOLN
INTRAMUSCULAR | Status: DC | PRN
  Administered 2017-11-01 (×3): 50 ug via INTRAVENOUS
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
## 2017-11-01 NOTE — Sedation Documentation (Signed)
Patient is resting comfortably. 

## 2017-11-01 NOTE — Sedation Documentation (Signed)
Para in process

## 2017-11-01 NOTE — Sedation Documentation (Signed)
Will do para 1st
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
## 2017-11-01 NOTE — Sedation Documentation (Signed)
Some of labs hemolyzed- MD aware

## 2017-11-01 NOTE — Sedation Documentation (Signed)
Pt will transfer over to CT area for omental bx

## 2017-11-01 NOTE — Procedures (Signed)
Interventional Radiology Procedure Note  Procedure: Single Lumen Power Port Placement    Access:  Right IJ vein.  Findings: Catheter tip positioned at SVC/RA junction. Port is ready for immediate use.   Complications: None  EBL: < 10 mL  Recommendations:  - Ok to shower in 24 hours - Do not submerge for 7 days - Routine line care   Adeana Grilliot T. Kathlene Cote, M.D Pager:  [phone]

## 2017-11-01 NOTE — Procedures (Signed)
Interventional Radiology Procedure Note  Procedure: CT guided paracentesis and peritoneal biopsy  Complications: None  Estimated Blood Loss: < 10 mL  Findings: Paracentesis via 6 Fr Safety Centesis catheter yielded 3.5 L of ascites.  Sample sent for cytology.  Left lateral peritoneal biopsy of tumor via 17 G needle with 18 G core device yielded sparse tissue fragments.    Venetia Night. Kathlene Cote, M.D Pager:  [phone]

## 2017-11-01 NOTE — Progress Notes (Signed)
Pt is awaiting discharge, VS stable, Discharge instructions given. IV removed.

## 2017-11-01 NOTE — H&P (Signed)
Chief Complaint: Patient was seen in consultation today for Port a cath and omental mass biopsy at the request of Betsy Coder B  Referring Physician(s): Ladell Pier  Supervising Physician: Aletta Edouard  Patient Status: [name] - Out-pt  History of Present Illness: Derek Lloyd is a 47 y.o. male   Metastatic carcinoma Ascites Omental mass/peritoneal mass  Dr Therisa Doyne: Multiple polyps were removed including polyps from the rectum, sigmoid colon, cecum, hepatic flexure, transverse colon, and descending colon.  A 6 mm polyp was noted at the appendiceal orifice.  The polyp was removed. The pathology [id number]) revealed multiple tubular adenomas without high-grade dysplasia  Paracentesis x 2: cyto neg  Scheduled now for Port A Cath and omental mass biopsy  Pt has no ride home--- Nurse navigator from [place] is working on helping pt with this now Dr Kathlene Cote will move forward   Past Medical History:  Diagnosis Date  . Abdominal pain 09/2017  . Anxiety attack   . Headache    "frequently" (08/14/2017)  . Heart murmur    "when I was a kid" (08/14/2017)  . Small bowel obstruction ([place]) 08/13/2017    Past Surgical History:  Procedure Laterality Date  . BIOPSY  10/02/2017   Procedure: BIOPSY;  Surgeon: [name], MD;  Location: [facility] ENDOSCOPY;  Service: Gastroenterology;;  . COLONOSCOPY WITH PROPOFOL N/A 10/02/2017   Procedure: COLONOSCOPY WITH PROPOFOL;  Surgeon: [name], MD;  Location: [place];  Service: Gastroenterology;  Laterality: N/A;  . FINGER SURGERY Left    "thumb"  . IR PARACENTESIS  09/24/2017  . POLYPECTOMY N/A 10/02/2017   Procedure: POLYPECTOMY;  Surgeon: [name], MD;  Location: [place];  Service: Gastroenterology;  Laterality: N/A;  . SUBMUCOSAL INJECTION  10/02/2017   Procedure: SUBMUCOSAL INJECTION;  Surgeon: [name], MD;  Location: [facility] ENDOSCOPY;  Service: Gastroenterology;;  . [place] TUBE PLACEMENT Bilateral      Allergies: Asa [aspirin] and Piperacillin-tazobactam in dex  Medications: Prior to Admission medications   Medication Sig Start Date End Date Taking? Authorizing Provider  ALPRAZolam (XANAX) 0.25 MG tablet Take 1 tablet (0.25 mg total) by mouth 3 (three) times daily as needed for anxiety. 10/24/17  Yes Owens Shark, NP  HYDROcodone-acetaminophen (NORCO/VICODIN) 5-325 MG tablet Take 1 tablet by mouth every 4 (four) hours as needed for moderate pain. 10/11/17  Yes Ladell Pier, MD  oxyCODONE (OXY IR/ROXICODONE) 5 MG immediate release tablet Take 1-2 tablets (5-10 mg total) by mouth every 4 (four) hours as needed for severe pain. 10/24/17  Yes Owens Shark, NP  prochlorperazine (COMPAZINE) 10 MG tablet Take 1 tablet (10 mg total) by mouth every 6 (six) hours as needed for nausea or vomiting. 10/24/17  Yes Owens Shark, NP     Family History  Problem Relation Age of Onset  . Prostate cancer Paternal Grandfather   . Crohn's disease Neg Hx     Social History   Socioeconomic History  . Marital status: Single    Spouse name: Not on file  . Number of children: Not on file  . Years of education: Not on file  . Highest education level: Not on file  Occupational History  . Not on file  Social Needs  . Financial resource strain: Not on file  . Food insecurity:    Worry: Not on file    Inability: Not on file  . Transportation needs:    Medical: Not on file    Non-medical: Not on file  Tobacco Use  .  Smoking status: Current Some Day Smoker    Packs/day: 1.00    Types: Cigarettes    Last attempt to quit: 09/28/2016    Years since quitting: 1.0  . Smokeless tobacco: Never Used  . Tobacco comment: 08/14/2017 "I quit; can't tell you when I quit or when I started"  Substance and Sexual Activity  . Alcohol use: Yes    Comment: 08/14/2017 "I drink when I feel like it; nothing recently"  . Drug use: No  . Sexual activity: Not on file  Lifestyle  . Physical activity:    Days per  week: Not on file    Minutes per session: Not on file  . Stress: Not on file  Relationships  . Social connections:    Talks on phone: Not on file    Gets together: Not on file    Attends religious service: Not on file    Active member of club or organization: Not on file    Attends meetings of clubs or organizations: Not on file    Relationship status: Not on file  Other Topics Concern  . Not on file  Social History Narrative  . Not on file    Review of Systems: A 12 point ROS discussed and pertinent positives are indicated in the HPI above.  All other systems are negative.  Review of Systems  Constitutional: Positive for fatigue and unexpected weight change. Negative for activity change, appetite change and fever.  Respiratory: Negative for cough and shortness of breath.   Cardiovascular: Negative for chest pain.  Gastrointestinal: Positive for abdominal distention and abdominal pain.  Neurological: Negative for weakness.  Psychiatric/Behavioral: Negative for behavioral problems and confusion.    Vital Signs: BP 112/76 (BP Location: Right Arm)   Pulse 81   Resp 20   Ht 5' 10"  (1.778 m)   Wt 120 lb (54.4 kg)   SpO2 99%   BMI 17.22 kg/m   Physical Exam  Constitutional: He is oriented to person, place, and time.  Cardiovascular: Normal rate, regular rhythm and normal heart sounds.  Pulmonary/Chest: Effort normal and breath sounds normal.  Abdominal: Soft. Bowel sounds are normal. He exhibits distension. There is tenderness.  Musculoskeletal: Normal range of motion.  Neurological: He is alert and oriented to person, place, and time.  Skin: Skin is warm and dry.  Psychiatric: He has a normal mood and affect. His behavior is normal. Judgment and thought content normal.  Nursing note and vitals reviewed.   Imaging: No results found.  Labs:  CBC: Recent Labs    09/30/17 0708 10/02/17 0348 10/03/17 0515 11/01/17 0907  WBC 9.8 5.5 7.1 8.2  HGB 14.8 13.4 13.3 16.2   HCT 44.9 41.6 40.2 49.4  PLT 501* 494* 601* 574*    COAGS: Recent Labs    01/29/17 0344 09/25/17 0355 09/27/17 0702  INR 1.08 1.08 1.21    BMP: Recent Labs    09/27/17 0702 09/30/17 0708 10/02/17 0348 10/03/17 0515  NA 137 139 139 142  K 3.5 4.2 3.8 4.2  CL 98* 103 103 102  CO2 27 29 26 31   GLUCOSE 88 102* 83 107*  BUN <5* 7 5* 8  CALCIUM 8.7* 8.7* 8.5* 8.6*  CREATININE 0.93 0.93 0.83 0.90  GFRNONAA >60 >60 >60 >60  GFRAA >60 >60 >60 >60    LIVER FUNCTION TESTS: Recent Labs    09/24/17 0417 09/25/17 0355 09/27/17 0702 10/02/17 0348  BILITOT 1.2 1.0 0.9 0.3  AST 15 17 18  33  ALT 14* 14* 20 18  ALKPHOS 107 113 174* 123  PROT 6.1* 6.1* 6.4* 5.2*  ALBUMIN 3.0* 2.9* 2.9* 2.4*    TUMOR MARKERS: No results for input(s): AFPTM, CEA, CA199, CHROMGRNA in the last 8760 hours.  Assessment and Plan:  Metastatic carcinoma Scheduled for PAC placement and omental mass biopsy  Risks and benefits of image guided port-a-catheter placement was discussed with the patient including, but not limited to bleeding, infection, pneumothorax, or fibrin sheath development and need for additional procedures. All of the patient's questions were answered, patient is agreeable to proceed. Consent signed and in chart.  Risks and benefits discussed with the patient including, but not limited to bleeding, infection, damage to adjacent structures or low yield requiring additional tests. All of the patient's questions were answered, patient is agreeable to proceed. Consent signed and in chart.   Thank you for this interesting consult.  I greatly enjoyed meeting Derek Lloyd and look forward to participating in their care.  A copy of this report was sent to the requesting provider on this date.  Electronically Signed: Hazim Treadway A, PA-C 11/01/2017, 10:30 AM   I spent a total of    40 Minutes in face to face in clinical consultation, greater than 50% of which was  counseling/coordinating care for [facility] and omental mass bx

## 2017-11-01 NOTE — Sedation Documentation (Signed)
Pt does not have a ride for going home after conscious sedation, Pt will speak with PA and MD to see what his options are.

## 2017-11-01 NOTE — Sedation Documentation (Signed)
Ready to start biopsy

## 2017-11-03 ENCOUNTER — Other Ambulatory Visit: Payer: Self-pay | Admitting: Oncology

## 2017-11-04 ENCOUNTER — Encounter: Payer: Self-pay | Admitting: Oncology

## 2017-11-04 ENCOUNTER — Encounter: Payer: Self-pay | Admitting: *Deleted

## 2017-11-04 ENCOUNTER — Inpatient Hospital Stay (HOSPITAL_BASED_OUTPATIENT_CLINIC_OR_DEPARTMENT_OTHER): Payer: BLUE CROSS/BLUE SHIELD | Admitting: Oncology

## 2017-11-04 ENCOUNTER — Inpatient Hospital Stay: Payer: BLUE CROSS/BLUE SHIELD

## 2017-11-04 ENCOUNTER — Telehealth: Payer: Self-pay | Admitting: Oncology

## 2017-11-04 VITALS — BP 108/83 | HR 87 | Temp 98.6°F | Resp 18 | Ht 70.0 in | Wt 120.7 lb

## 2017-11-04 DIAGNOSIS — F419 Anxiety disorder, unspecified: Secondary | ICD-10-CM | POA: Diagnosis not present

## 2017-11-04 DIAGNOSIS — C801 Malignant (primary) neoplasm, unspecified: Secondary | ICD-10-CM

## 2017-11-04 DIAGNOSIS — C786 Secondary malignant neoplasm of retroperitoneum and peritoneum: Secondary | ICD-10-CM | POA: Diagnosis not present

## 2017-11-04 DIAGNOSIS — C799 Secondary malignant neoplasm of unspecified site: Secondary | ICD-10-CM

## 2017-11-04 LAB — CBC WITH DIFFERENTIAL (CANCER CENTER ONLY)
Basophils Absolute: 0.1 10*3/uL (ref 0.0–0.1)
Basophils Relative: 1 %
Eosinophils Absolute: 0.2 10*3/uL (ref 0.0–0.5)
Eosinophils Relative: 2 %
HCT: 44.2 % (ref 38.4–49.9)
Hemoglobin: 15 g/dL (ref 13.0–17.1)
LYMPHS ABS: 1.8 10*3/uL (ref 0.9–3.3)
LYMPHS PCT: 24 %
MCH: 31.4 pg (ref 27.2–33.4)
MCHC: 34 g/dL (ref 32.0–36.0)
MCV: 92.6 fL (ref 79.3–98.0)
MONO ABS: 0.7 10*3/uL (ref 0.1–0.9)
MONOS PCT: 9 %
Neutro Abs: 5 10*3/uL (ref 1.5–6.5)
Neutrophils Relative %: 64 %
PLATELETS: 474 10*3/uL — AB (ref 140–400)
RBC: 4.78 MIL/uL (ref 4.20–5.82)
RDW: 12.7 % (ref 11.0–14.6)
WBC Count: 7.8 10*3/uL (ref 4.0–10.3)

## 2017-11-04 LAB — CMP (CANCER CENTER ONLY)
ALBUMIN: 2.8 g/dL — AB (ref 3.5–5.0)
ALT: 31 U/L (ref 0–55)
AST: 34 U/L (ref 5–34)
Alkaline Phosphatase: 330 U/L — ABNORMAL HIGH (ref 40–150)
Anion gap: 8 (ref 3–11)
BILIRUBIN TOTAL: 0.3 mg/dL (ref 0.2–1.2)
BUN: 10 mg/dL (ref 7–26)
CHLORIDE: 106 mmol/L (ref 98–109)
CO2: 25 mmol/L (ref 22–29)
Calcium: 9.1 mg/dL (ref 8.4–10.4)
Creatinine: 0.68 mg/dL — ABNORMAL LOW (ref 0.70–1.30)
GFR, Est AFR Am: >60 mL/min (ref >60)
GFR, Estimated: >60 mL/min (ref >60)
GLUCOSE: 80 mg/dL (ref 70–140)
POTASSIUM: 4.6 mmol/L (ref 3.5–5.1)
Sodium: 139 mmol/L (ref 136–145)
Total Protein: 6.6 g/dL (ref 6.4–8.3)

## 2017-11-04 LAB — CEA (IN HOUSE-CHCC): CEA (CHCC-In House): 4.38 ng/mL (ref 0.00–5.00)

## 2017-11-04 MED ORDER — DEXAMETHASONE SODIUM PHOSPHATE 10 MG/ML IJ SOLN
10.0000 mg | Freq: Once | INTRAMUSCULAR | Status: AC
  Administered 2017-11-04: 10 mg via INTRAVENOUS

## 2017-11-04 MED ORDER — DEXAMETHASONE SODIUM PHOSPHATE 10 MG/ML IJ SOLN
INTRAMUSCULAR | Status: AC
  Filled 2017-11-04: qty 1

## 2017-11-04 MED ORDER — OXYCODONE HCL 5 MG PO TABS: 5.0000 mg | ORAL_TABLET | Freq: Four times a day (QID) | ORAL | 0 refills | Status: DC | PRN

## 2017-11-04 MED ORDER — OXYCODONE-ACETAMINOPHEN 5-325 MG PO TABS
2.0000 | ORAL_TABLET | Freq: Once | ORAL | Status: AC
  Administered 2017-11-04: 2 via ORAL

## 2017-11-04 MED ORDER — DEXTROSE 5 % IV SOLN
Freq: Once | INTRAVENOUS | Status: AC
  Administered 2017-11-04: 12:00:00 via INTRAVENOUS

## 2017-11-04 MED ORDER — PALONOSETRON HCL INJECTION 0.25 MG/5ML
INTRAVENOUS | Status: AC
  Filled 2017-11-04: qty 5

## 2017-11-04 MED ORDER — OXYCODONE-ACETAMINOPHEN 5-325 MG PO TABS
ORAL_TABLET | ORAL | Status: AC
  Filled 2017-11-04: qty 2

## 2017-11-04 MED ORDER — OXALIPLATIN CHEMO INJECTION 100 MG/20ML
85.0000 mg/m2 | Freq: Once | INTRAVENOUS | Status: AC
  Administered 2017-11-04: 140 mg via INTRAVENOUS
  Filled 2017-11-04: qty 20

## 2017-11-04 MED ORDER — LEUCOVORIN CALCIUM INJECTION 350 MG
400.0000 mg/m2 | Freq: Once | INTRAVENOUS | Status: AC
  Administered 2017-11-04: 664 mg via INTRAVENOUS
  Filled 2017-11-04: qty 33.2

## 2017-11-04 MED ORDER — SODIUM CHLORIDE 0.9 % IV SOLN
2400.0000 mg/m2 | INTRAVENOUS | Status: DC
  Administered 2017-11-04: 4000 mg via INTRAVENOUS
  Filled 2017-11-04: qty 80

## 2017-11-04 MED ORDER — SODIUM CHLORIDE 0.9% FLUSH
10.0000 mL | Freq: Once | INTRAVENOUS | Status: AC
  Administered 2017-11-04: 10 mL via INTRAVENOUS
  Filled 2017-11-04: qty 10

## 2017-11-04 MED ORDER — LIDOCAINE-PRILOCAINE 2.5-2.5 % EX CREA: TOPICAL_CREAM | CUTANEOUS | 0 refills | Status: DC

## 2017-11-04 MED ORDER — FLUOROURACIL CHEMO INJECTION 2.5 GM/50ML
400.0000 mg/m2 | Freq: Once | INTRAVENOUS | Status: AC
  Administered 2017-11-04: 650 mg via INTRAVENOUS
  Filled 2017-11-04: qty 13

## 2017-11-04 MED ORDER — PALONOSETRON HCL INJECTION 0.25 MG/5ML
0.2500 mg | Freq: Once | INTRAVENOUS | Status: AC
  Administered 2017-11-04: 0.25 mg via INTRAVENOUS

## 2017-11-04 MED ORDER — ALPRAZOLAM 0.25 MG PO TABS: 0.2500 mg | ORAL_TABLET | Freq: Three times a day (TID) | ORAL | 0 refills | Status: DC | PRN

## 2017-11-04 NOTE — Progress Notes (Signed)
[facility] OFF[facility] PROGRESS NOTE   Diagnosis: Metastatic adenocarcinoma  INTERVAL HISTORY:   Derek Lloyd returns for a scheduled visit.  He underwent a paracentesis and biopsy of a peritoneal nodule on 11/01/2017.  He also underwent Port-A-Cath placement 11/01/2017.  He continues to have abdominal pain.  He is taking 2 oxycodone tablets 3 times per day.  He attended a chemotherapy teaching class today.  Objective:  Vital signs in last 24 hours:  Blood pressure 108/83, pulse 87, temperature 98.6 F (37 C), temperature source Oral, resp. rate 18, height 5' 10"  (1.778 m), weight 120 lb 11.2 oz (54.7 kg), SpO2 100 %.    HEENT: No thrush Resp: Lungs clear bilaterally, no respiratory distress Cardio: Regular rate and rhythm GI: No hepatomegaly, diffusely distended with ascites, small amount of drainage at the left mid abdomen biopsy site Vascular: No leg edema    Portacath/PICC-without erythema  Lab Results:  Lab Results  Component Value Date   WBC 7.8 11/04/2017   HGB 15.0 11/04/2017   HCT 44.2 11/04/2017   MCV 92.6 11/04/2017   PLT 474 (H) 11/04/2017   NEUTROABS 5.0 11/04/2017    CMP  Lab Results  Component Value Date   NA 142 10/03/2017   K 4.2 10/03/2017   CL 102 10/03/2017   CO2 31 10/03/2017   GLUCOSE 107 (H) 10/03/2017   BUN 8 10/03/2017   CREATININE 0.90 10/03/2017   CALCIUM 8.6 (L) 10/03/2017   PROT 5.2 (L) 10/02/2017   ALBUMIN 2.4 (L) 10/02/2017   AST 33 10/02/2017   ALT 18 10/02/2017   ALKPHOS 123 10/02/2017   BILITOT 0.3 10/02/2017   GFRNONAA >60 10/03/2017   GFRAA >60 10/03/2017    No results found for: CEA1  Lab Results  Component Value Date   INR 1.21 09/27/2017    Imaging:  Ct Chest W Contrast  Result Date: 11/01/2017 CLINICAL DATA:  Chest pain, shortness of breath, smoker EXAM: CT CHEST WITH CONTRAST TECHNIQUE: Multidetector CT imaging of the chest was performed during intravenous contrast administration. Sagittal and  coronal MPR images reconstructed from axial data set. CONTRAST:  126m OMNIPAQUE IOHEXOL 300 MG/ML  SOLN IV COMPARISON:  None FINDINGS: Cardiovascular: Aorta normal caliber. RIGHT jugular Port-A-Cath with tip in superior RIGHT atrium. Heart normal size. No pericardial effusion. Mediastinum/Nodes: Base of cervical region normal appearance. Few normal size mediastinal lymph nodes without thoracic adenopathy. Esophagus unremarkable. Lungs/Pleura: Minimal dependent atelectasis in the posterior lower lobes bilaterally. Small pneumatocele in RIGHT lower lobe. Questionable pleural nodules versus atelectasis at anterior lung bases 14 x 8 mm LEFT image 132 and 20 x 6 mm RIGHT image 129. Lungs otherwise clear. No pulmonary infiltrate, pleural effusion or pneumothorax. Upper Abdomen: Significant ascites in upper abdomen. Visualized solid organs and upper abdomen unremarkable. Musculoskeletal: No focal osseous lesions. IMPRESSION: Questionable small pleural nodules versus atelectasis at the anterior lung bases bilaterally. Minimal dependent atelectasis in the posterior lungs. No other significant intrathoracic abnormalities. Significant ascites in upper abdomen. Electronically Signed   By: MLavonia DanaM.D.   On: 11/01/2017 16:02   Ir [place] Vasc Access Right  Result Date: 11/01/2017 CLINICAL DATA:  Metastatic colon carcinoma and need for porta cath for chemotherapy. EXAM: IMPLANTED PORT A CATH PLACEMENT WITH ULTRASOUND AND FLUOROSCOPIC GUIDANCE ANESTHESIA/SEDATION: 4.0 mg IV Versed; 150 mcg IV Fentanyl Total Moderate Sedation Time:  25 minutes The patient's level of consciousness and physiologic status were continuously monitored during the procedure by Radiology nursing. Additional Medications: 1 g  IV vancomycin. FLUOROSCOPY TIME:  30 seconds.  0.9 mGy. PROCEDURE: The procedure, risks, benefits, and alternatives were explained to the patient. Questions regarding the procedure were encouraged and answered. The patient  understands and consents to the procedure. A time-out was performed prior to initiating the procedure. Ultrasound was utilized to confirm patency of the right internal jugular vein. The right neck and chest were prepped with chlorhexidine in a sterile fashion, and a sterile drape was applied covering the operative field. Maximum barrier sterile technique with sterile gowns and gloves were used for the procedure. Local anesthesia was provided with 1% lidocaine. After creating a small venotomy incision, a 21 gauge needle was advanced into the right internal jugular vein under direct, real-time ultrasound guidance. Ultrasound image documentation was performed. After securing guidewire access, an 8 Fr dilator was placed. A J-wire was kinked to measure appropriate catheter length. A subcutaneous port pocket was then created along the upper chest wall utilizing sharp and blunt dissection. Portable cautery was utilized. The pocket was irrigated with sterile saline. A single lumen power injectable port was chosen for placement. The 8 Fr catheter was tunneled from the port pocket site to the venotomy incision. The port was placed in the pocket. External catheter was trimmed to appropriate length based on guidewire measurement. At the venotomy, an 8 Fr peel-away sheath was placed over a guidewire. The catheter was then placed through the sheath and the sheath removed. Final catheter positioning was confirmed and documented with a fluoroscopic spot image. The port was accessed with a needle and aspirated and flushed with heparinized saline. The access needle was removed. The venotomy and port pocket incisions were closed with subcutaneous 3-0 Monocryl and subcuticular 4-0 Vicryl. Dermabond was applied to both incisions. COMPLICATIONS: COMPLICATIONS None FINDINGS: After catheter placement, the tip lies at the cavo-atrial junction. The catheter aspirates normally and is ready for immediate use. IMPRESSION: Placement of single  lumen port a cath via right internal jugular vein. The catheter tip lies at the cavo-atrial junction. A power injectable port a cath was placed and is ready for immediate use. Electronically Signed   By: Aletta Edouard M.D.   On: 11/01/2017 14:32   Ct Biopsy  Result Date: 11/01/2017 CLINICAL DATA:  Colon carcinoma, ascites and evidence of peritoneal tumor by CT. The patient presents for peritoneal tumor biopsy. EXAM: 1. CT-GUIDED PARACENTESIS 2. CT GUIDED CORE BIOPSY OF PERITONEAL MASS ANESTHESIA/SEDATION: 2.0 mg IV Versed; 50 mcg IV Fentanyl Total Moderate Sedation Time:  15 minutes. The patient's level of consciousness and physiologic status were continuously monitored during the procedure by Radiology nursing. PROCEDURE: The procedure risks, benefits, and alternatives were explained to the patient. Questions regarding the procedure were encouraged and answered. The patient understands and consents to the procedure. A time-out was performed prior to initiating the procedure. Initial CT of the abdomen was performed in a supine position. The left abdominal wall was prepped with chlorhexidine in a sterile fashion, and a sterile drape was applied covering the operative field. A sterile gown and sterile gloves were used for the procedure. Local anesthesia was provided with 1% Lidocaine. Paracentesis was performed with advancement of a 6 French Safe-T-Centesis catheter into the left peritoneal cavity. Fluid was removed with vacuum bottles. A large fluid sample was sent for cytologic analysis. After additional CT, a 17 gauge needle was advanced under CT guidance into the left lateral peritoneal cavity. Coaxial 18 gauge core biopsy was performed with 2 samples obtained. Samples were submitted in formalin.  Additional CT was performed. The centesis catheter was removed. COMPLICATIONS: None FINDINGS: Initial CT demonstrates significant increase in intra-abdominal ascites since prior CT on 09/26/2017. Peritoneal biopsy  was not possible without fluid removal and therefore paracentesis was performed. This yielded 3.5 L of yellowish colored fluid. A sample was sent for cytologic analysis. It was difficult to define significant solid tumor in the peritoneal cavity. A region of soft tissue was targeted just lateral to the splenic flexure of the colon. Core biopsy yielded sparse amounts of fragmented tissue. IMPRESSION: 1. Significant increase in amount of ascites in the peritoneal cavity. Paracentesis was performed prior to peritoneal biopsy. 3.5 L of fluid was removed and a sample sent for cytologic analysis. 2. CT-guided core biopsy performed of soft tissue in the peritoneal cavity lateral to the splenic flexure of the colon. This only yielded sparse amounts of fragmented tissue. Electronically Signed   By: Aletta Edouard M.D.   On: 11/01/2017 14:43   Ct Image Guided Fluid Drain By Catheter  Result Date: 11/01/2017 CLINICAL DATA:  Colon carcinoma, ascites and evidence of peritoneal tumor by CT. The patient presents for peritoneal tumor biopsy. EXAM: 1. CT-GUIDED PARACENTESIS 2. CT GUIDED CORE BIOPSY OF PERITONEAL MASS ANESTHESIA/SEDATION: 2.0 mg IV Versed; 50 mcg IV Fentanyl Total Moderate Sedation Time:  15 minutes. The patient's level of consciousness and physiologic status were continuously monitored during the procedure by Radiology nursing. PROCEDURE: The procedure risks, benefits, and alternatives were explained to the patient. Questions regarding the procedure were encouraged and answered. The patient understands and consents to the procedure. A time-out was performed prior to initiating the procedure. Initial CT of the abdomen was performed in a supine position. The left abdominal wall was prepped with chlorhexidine in a sterile fashion, and a sterile drape was applied covering the operative field. A sterile gown and sterile gloves were used for the procedure. Local anesthesia was provided with 1% Lidocaine. Paracentesis  was performed with advancement of a 6 French Safe-T-Centesis catheter into the left peritoneal cavity. Fluid was removed with vacuum bottles. A large fluid sample was sent for cytologic analysis. After additional CT, a 17 gauge needle was advanced under CT guidance into the left lateral peritoneal cavity. Coaxial 18 gauge core biopsy was performed with 2 samples obtained. Samples were submitted in formalin. Additional CT was performed. The centesis catheter was removed. COMPLICATIONS: None FINDINGS: Initial CT demonstrates significant increase in intra-abdominal ascites since prior CT on 09/26/2017. Peritoneal biopsy was not possible without fluid removal and therefore paracentesis was performed. This yielded 3.5 L of yellowish colored fluid. A sample was sent for cytologic analysis. It was difficult to define significant solid tumor in the peritoneal cavity. A region of soft tissue was targeted just lateral to the splenic flexure of the colon. Core biopsy yielded sparse amounts of fragmented tissue. IMPRESSION: 1. Significant increase in amount of ascites in the peritoneal cavity. Paracentesis was performed prior to peritoneal biopsy. 3.5 L of fluid was removed and a sample sent for cytologic analysis. 2. CT-guided core biopsy performed of soft tissue in the peritoneal cavity lateral to the splenic flexure of the colon. This only yielded sparse amounts of fragmented tissue. Electronically Signed   By: Aletta Edouard M.D.   On: 11/01/2017 14:43   [facility] Guided Port Insertion  Result Date: 11/01/2017 CLINICAL DATA:  Metastatic colon carcinoma and need for porta cath for chemotherapy. EXAM: IMPLANTED PORT A CATH PLACEMENT WITH ULTRASOUND AND FLUOROSCOPIC GUIDANCE ANESTHESIA/SEDATION: 4.0 mg IV Versed; 150  mcg IV Fentanyl Total Moderate Sedation Time:  25 minutes The patient's level of consciousness and physiologic status were continuously monitored during the procedure by Radiology nursing. Additional  Medications: 1 g IV vancomycin. FLUOROSCOPY TIME:  30 seconds.  0.9 mGy. PROCEDURE: The procedure, risks, benefits, and alternatives were explained to the patient. Questions regarding the procedure were encouraged and answered. The patient understands and consents to the procedure. A time-out was performed prior to initiating the procedure. Ultrasound was utilized to confirm patency of the right internal jugular vein. The right neck and chest were prepped with chlorhexidine in a sterile fashion, and a sterile drape was applied covering the operative field. Maximum barrier sterile technique with sterile gowns and gloves were used for the procedure. Local anesthesia was provided with 1% lidocaine. After creating a small venotomy incision, a 21 gauge needle was advanced into the right internal jugular vein under direct, real-time ultrasound guidance. Ultrasound image documentation was performed. After securing guidewire access, an 8 Fr dilator was placed. A J-wire was kinked to measure appropriate catheter length. A subcutaneous port pocket was then created along the upper chest wall utilizing sharp and blunt dissection. Portable cautery was utilized. The pocket was irrigated with sterile saline. A single lumen power injectable port was chosen for placement. The 8 Fr catheter was tunneled from the port pocket site to the venotomy incision. The port was placed in the pocket. External catheter was trimmed to appropriate length based on guidewire measurement. At the venotomy, an 8 Fr peel-away sheath was placed over a guidewire. The catheter was then placed through the sheath and the sheath removed. Final catheter positioning was confirmed and documented with a fluoroscopic spot image. The port was accessed with a needle and aspirated and flushed with heparinized saline. The access needle was removed. The venotomy and port pocket incisions were closed with subcutaneous 3-0 Monocryl and subcuticular 4-0 Vicryl. Dermabond  was applied to both incisions. COMPLICATIONS: COMPLICATIONS None FINDINGS: After catheter placement, the tip lies at the cavo-atrial junction. The catheter aspirates normally and is ready for immediate use. IMPRESSION: Placement of single lumen port a cath via right internal jugular vein. The catheter tip lies at the cavo-atrial junction. A power injectable port a cath was placed and is ready for immediate use. Electronically Signed   By: Aletta Edouard M.D.   On: 11/01/2017 14:32    Medications: I have reviewed the patient's current medications.   Assessment/Plan: 1. Metastatic adenocarcinoma ? 10/02/2017 biopsy of an appendiceal orifice "polyp" revealed invasive adenocarcinoma ? CT abdomen/pelvis 09/26/2017 with evidence of carcinomatosis-omental caking and ascites ? Colonoscopy 10/02/2017 with multiple polyps-tubular adenomas ? Paracentesis 09/24/2017, 09/28/2017-no malignant cells identified ? Biopsy of left peritoneal mass 11/01/2017 ? CT chest 11/01/2017-?  Small pleural nodules versus atelectasis, no other evidence of metastatic disease in the chest ? Cycle 1 FOLFOX 11/04/2017  2.Abdominal pain and bloating secondary to #1  3.   Port-A-Cath placement 11/01/2017   Disposition: Mr. Fortson appears unchanged.  He underwent Port-A-Cath placement 11/01/2017.  He will complete cycle 1 FOLFOX today.  He attended a chemotherapy teaching class and agrees to proceed with chemotherapy.  He will continue oxycodone as needed for pain.  We refilled prescriptions for oxycodone and Xanax.  He will return for an office visit and chemotherapy in 2 weeks.  We will refer him to Dr. Clovis Riley within the next 1-2 months to consider the indication for cytoreductive surgery and intraperitoneal chemotherapy.  Betsy Coder, MD  11/04/2017  10:18 AM

## 2017-11-04 NOTE — Patient Instructions (Signed)
[place] Discharge Instructions for Patients Receiving Chemotherapy  Today you received the following chemotherapy agents: Oxaliplatin, Leucovorin, and Adrucil.  To help prevent nausea and vomiting after your treatment, we encourage you to take your nausea medication as directed.   If you develop nausea and vomiting that is not controlled by your nausea medication, call the clinic.   BELOW ARE SYMPTOMS THAT SHOULD BE REPORTED IMMEDIATELY:  *FEVER GREATER THAN 100.5 F  *CHILLS WITH OR WITHOUT FEVER  NAUSEA AND VOMITING THAT IS NOT CONTROLLED WITH YOUR NAUSEA MEDICATION  *UNUSUAL SHORTNESS OF BREATH  *UNUSUAL BRUISING OR BLEEDING  TENDERNESS IN MOUTH AND THROAT WITH OR WITHOUT PRESENCE OF ULCERS  *URINARY PROBLEMS  *BOWEL PROBLEMS  UNUSUAL RASH Items with * indicate a potential emergency and should be followed up as soon as possible.  Feel free to call the clinic should you have any questions or concerns. The clinic phone number is [phone].  Please show the [place] at check-in to the Emergency Department and triage nurse.  Oxaliplatin Injection What is this medicine? OXALIPLATIN (ox AL i PLA tin) is a chemotherapy drug. It targets fast dividing cells, like cancer cells, and causes these cells to die. This medicine is used to treat cancers of the colon and rectum, and many other cancers. This medicine may be used for other purposes; ask your health care provider or pharmacist if you have questions. COMMON BRAND NAME(S): Eloxatin What should I tell my health care provider before I take this medicine? They need to know if you have any of these conditions: -kidney disease -an unusual or allergic reaction to oxaliplatin, other chemotherapy, other medicines, foods, dyes, or preservatives -pregnant or trying to get pregnant -breast-feeding How should I use this medicine? This drug is given as an infusion into a vein. It is administered in a  hospital or clinic by a specially trained health care professional. Talk to your pediatrician regarding the use of this medicine in children. Special care may be needed. Overdosage: If you think you have taken too much of this medicine contact a poison control center or emergency room at once. NOTE: This medicine is only for you. Do not share this medicine with others. What if I miss a dose? It is important not to miss a dose. Call your doctor or health care professional if you are unable to keep an appointment. What may interact with this medicine? -medicines to increase blood counts like filgrastim, pegfilgrastim, sargramostim -probenecid -some antibiotics like amikacin, gentamicin, neomycin, polymyxin B, streptomycin, tobramycin -zalcitabine Talk to your doctor or health care professional before taking any of these medicines: -acetaminophen -aspirin -ibuprofen -ketoprofen -naproxen This list may not describe all possible interactions. Give your health care provider a list of all the medicines, herbs, non-prescription drugs, or dietary supplements you use. Also tell them if you smoke, drink alcohol, or use illegal drugs. Some items may interact with your medicine. What should I watch for while using this medicine? Your condition will be monitored carefully while you are receiving this medicine. You will need important blood work done while you are taking this medicine. This medicine can make you more sensitive to cold. Do not drink cold drinks or use ice. Cover exposed skin before coming in contact with cold temperatures or cold objects. When out in cold weather wear warm clothing and cover your mouth and nose to warm the air that goes into your lungs. Tell your doctor if you get sensitive to the cold.  This drug may make you feel generally unwell. This is not uncommon, as chemotherapy can affect healthy cells as well as cancer cells. Report any side effects. Continue your course of treatment  even though you feel ill unless your doctor tells you to stop. In some cases, you may be given additional medicines to help with side effects. Follow all directions for their use. Call your doctor or health care professional for advice if you get a fever, chills or sore throat, or other symptoms of a cold or flu. Do not treat yourself. This drug decreases your body's ability to fight infections. Try to avoid being around people who are sick. This medicine may increase your risk to bruise or bleed. Call your doctor or health care professional if you notice any unusual bleeding. Be careful brushing and flossing your teeth or using a toothpick because you may get an infection or bleed more easily. If you have any dental work done, tell your dentist you are receiving this medicine. Avoid taking products that contain aspirin, acetaminophen, ibuprofen, naproxen, or ketoprofen unless instructed by your doctor. These medicines may hide a fever. Do not become pregnant while taking this medicine. Women should inform their doctor if they wish to become pregnant or think they might be pregnant. There is a potential for serious side effects to an unborn child. Talk to your health care professional or pharmacist for more information. Do not breast-feed an infant while taking this medicine. Call your doctor or health care professional if you get diarrhea. Do not treat yourself. What side effects may I notice from receiving this medicine? Side effects that you should report to your doctor or health care professional as soon as possible: -allergic reactions like skin rash, itching or hives, swelling of the face, lips, or tongue -low blood counts - This drug may decrease the number of white blood cells, red blood cells and platelets. You may be at increased risk for infections and bleeding. -signs of infection - fever or chills, cough, sore throat, pain or difficulty passing urine -signs of decreased platelets or  bleeding - bruising, pinpoint red spots on the skin, black, tarry stools, nosebleeds -signs of decreased red blood cells - unusually weak or tired, fainting spells, lightheadedness -breathing problems -chest pain, pressure -cough -diarrhea -jaw tightness -mouth sores -nausea and vomiting -pain, swelling, redness or irritation at the injection site -pain, tingling, numbness in the hands or feet -problems with balance, talking, walking -redness, blistering, peeling or loosening of the skin, including inside the mouth -trouble passing urine or change in the amount of urine Side effects that usually do not require medical attention (report to your doctor or health care professional if they continue or are bothersome): -changes in vision -constipation -hair loss -loss of appetite -metallic taste in the mouth or changes in taste -stomach pain This list may not describe all possible side effects. Call your doctor for medical advice about side effects. You may report side effects to FDA at 1-800-FDA-1088. Where should I keep my medicine? This drug is given in a hospital or clinic and will not be stored at home. NOTE: This sheet is a summary. It may not cover all possible information. If you have questions about this medicine, talk to your doctor, pharmacist, or health care provider.  2018 Elsevier/Gold Standard (2007-12-02 17:22:47) Leucovorin injection What is this medicine? LEUCOVORIN (loo koe VOR in) is used to prevent or treat the harmful effects of some medicines. This medicine is used to treat anemia  caused by a low amount of folic acid in the body. It is also used with 5-fluorouracil (5-FU) to treat colon cancer. This medicine may be used for other purposes; ask your health care provider or pharmacist if you have questions. What should I tell my health care provider before I take this medicine? They need to know if you have any of these conditions: -anemia from low levels of vitamin  B-12 in the blood -an unusual or allergic reaction to leucovorin, folic acid, other medicines, foods, dyes, or preservatives -pregnant or trying to get pregnant -breast-feeding How should I use this medicine? This medicine is for injection into a muscle or into a vein. It is given by a health care professional in a hospital or clinic setting. Talk to your pediatrician regarding the use of this medicine in children. Special care may be needed. Overdosage: If you think you have taken too much of this medicine contact a poison control center or emergency room at once. NOTE: This medicine is only for you. Do not share this medicine with others. What if I miss a dose? This does not apply. What may interact with this medicine? -capecitabine -fluorouracil -phenobarbital -phenytoin -primidone -trimethoprim-sulfamethoxazole This list may not describe all possible interactions. Give your health care provider a list of all the medicines, herbs, non-prescription drugs, or dietary supplements you use. Also tell them if you smoke, drink alcohol, or use illegal drugs. Some items may interact with your medicine. What should I watch for while using this medicine? Your condition will be monitored carefully while you are receiving this medicine. This medicine may increase the side effects of 5-fluorouracil, 5-FU. Tell your doctor or health care professional if you have diarrhea or mouth sores that do not get better or that get worse. What side effects may I notice from receiving this medicine? Side effects that you should report to your doctor or health care professional as soon as possible: -allergic reactions like skin rash, itching or hives, swelling of the face, lips, or tongue -breathing problems -fever, infection -mouth sores -unusual bleeding or bruising -unusually weak or tired Side effects that usually do not require medical attention (report to your doctor or health care professional if they  continue or are bothersome): -constipation or diarrhea -loss of appetite -nausea, vomiting This list may not describe all possible side effects. Call your doctor for medical advice about side effects. You may report side effects to FDA at 1-800-FDA-1088. Where should I keep my medicine? This drug is given in a hospital or clinic and will not be stored at home. NOTE: This sheet is a summary. It may not cover all possible information. If you have questions about this medicine, talk to your doctor, pharmacist, or health care provider.  2018 Elsevier/Gold Standard (2007-11-11 16:50:29) Fluorouracil, 5-FU injection What is this medicine? FLUOROURACIL, 5-FU (flure oh YOOR a sil) is a chemotherapy drug. It slows the growth of cancer cells. This medicine is used to treat many types of cancer like breast cancer, colon or rectal cancer, pancreatic cancer, and stomach cancer. This medicine may be used for other purposes; ask your health care provider or pharmacist if you have questions. COMMON BRAND NAME(S): Adrucil What should I tell my health care provider before I take this medicine? They need to know if you have any of these conditions: -blood disorders -dihydropyrimidine dehydrogenase (DPD) deficiency -infection (especially a virus infection such as chickenpox, cold sores, or herpes) -kidney disease -liver disease -malnourished, poor nutrition -recent or ongoing radiation  therapy -an unusual or allergic reaction to fluorouracil, other chemotherapy, other medicines, foods, dyes, or preservatives -pregnant or trying to get pregnant -breast-feeding How should I use this medicine? This drug is given as an infusion or injection into a vein. It is administered in a hospital or clinic by a specially trained health care professional. Talk to your pediatrician regarding the use of this medicine in children. Special care may be needed. Overdosage: If you think you have taken too much of this medicine  contact a poison control center or emergency room at once. NOTE: This medicine is only for you. Do not share this medicine with others. What if I miss a dose? It is important not to miss your dose. Call your doctor or health care professional if you are unable to keep an appointment. What may interact with this medicine? -allopurinol -cimetidine -dapsone -digoxin -hydroxyurea -leucovorin -levamisole -medicines for seizures like ethotoin, fosphenytoin, phenytoin -medicines to increase blood counts like filgrastim, pegfilgrastim, sargramostim -medicines that treat or prevent blood clots like warfarin, enoxaparin, and dalteparin -methotrexate -metronidazole -pyrimethamine -some other chemotherapy drugs like busulfan, cisplatin, estramustine, vinblastine -trimethoprim -trimetrexate -vaccines Talk to your doctor or health care professional before taking any of these medicines: -acetaminophen -aspirin -ibuprofen -ketoprofen -naproxen This list may not describe all possible interactions. Give your health care provider a list of all the medicines, herbs, non-prescription drugs, or dietary supplements you use. Also tell them if you smoke, drink alcohol, or use illegal drugs. Some items may interact with your medicine. What should I watch for while using this medicine? Visit your doctor for checks on your progress. This drug may make you feel generally unwell. This is not uncommon, as chemotherapy can affect healthy cells as well as cancer cells. Report any side effects. Continue your course of treatment even though you feel ill unless your doctor tells you to stop. In some cases, you may be given additional medicines to help with side effects. Follow all directions for their use. Call your doctor or health care professional for advice if you get a fever, chills or sore throat, or other symptoms of a cold or flu. Do not treat yourself. This drug decreases your body's ability to fight  infections. Try to avoid being around people who are sick. This medicine may increase your risk to bruise or bleed. Call your doctor or health care professional if you notice any unusual bleeding. Be careful brushing and flossing your teeth or using a toothpick because you may get an infection or bleed more easily. If you have any dental work done, tell your dentist you are receiving this medicine. Avoid taking products that contain aspirin, acetaminophen, ibuprofen, naproxen, or ketoprofen unless instructed by your doctor. These medicines may hide a fever. Do not become pregnant while taking this medicine. Women should inform their doctor if they wish to become pregnant or think they might be pregnant. There is a potential for serious side effects to an unborn child. Talk to your health care professional or pharmacist for more information. Do not breast-feed an infant while taking this medicine. Men should inform their doctor if they wish to father a child. This medicine may lower sperm counts. Do not treat diarrhea with over the counter products. Contact your doctor if you have diarrhea that lasts more than 2 days or if it is severe and watery. This medicine can make you more sensitive to the sun. Keep out of the sun. If you cannot avoid being in the sun, wear protective  clothing and use sunscreen. Do not use sun lamps or tanning beds/booths. What side effects may I notice from receiving this medicine? Side effects that you should report to your doctor or health care professional as soon as possible: -allergic reactions like skin rash, itching or hives, swelling of the face, lips, or tongue -low blood counts - this medicine may decrease the number of white blood cells, red blood cells and platelets. You may be at increased risk for infections and bleeding. -signs of infection - fever or chills, cough, sore throat, pain or difficulty passing urine -signs of decreased platelets or bleeding - bruising,  pinpoint red spots on the skin, black, tarry stools, blood in the urine -signs of decreased red blood cells - unusually weak or tired, fainting spells, lightheadedness -breathing problems -changes in vision -chest pain -mouth sores -nausea and vomiting -pain, swelling, redness at site where injected -pain, tingling, numbness in the hands or feet -redness, swelling, or sores on hands or feet -stomach pain -unusual bleediSide effects that usually do not require medical attention (report to your doctor or health care professional if they continue or are bothersome): -changes in finger or toe nails -diarrhea -dry or itchy skin -hair loss -headache -loss of appetite -sensitivity of eyes to the light -stomach upset -unusually teary eyes This list may not describe all possible side effects. Call your doctor for medical advice about side effects. You may report side effects to FDA at 1-800-FDA-1088. Where should I keep my medicine? This drug is given in a hospital or clinic and will not be stored at home. NOTE: This sheet is a summary. It may not cover all possible information. If you have questions about this medicine, talk to your doctor, pharmacist, or health care provider.  2018 Elsevier/Gold Standard (2007-09-10 13:53:16)

## 2017-11-04 NOTE — Progress Notes (Signed)
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  Unfortunately there aren't any foundations offering copay assistance for his Dx.  I offered the [place], went over what it covers and gave him an expense sheet.  Pt is not working right now so he will provide a letter of support.  Once received he will be approved for the grant.  He also needs assistance w/ transportation so the social worker Vernie Shanks will meet w/ him to address those needs.  He has my card for any questions or concerns he may have in the future.

## 2017-11-04 NOTE — Progress Notes (Signed)
Met with pt during f/u apt with Dr. Benay Spice. I reached out to financial advocate to assist pt with available resources. Reached out to social work asking that they educate pt on transportation service that he is signed up for. Will reach out to [facility] representative to coordinate apt on Wednesday for assistance filling out disability and Medicaid application.

## 2017-11-04 NOTE — Telephone Encounter (Signed)
Gave pt avs and calendar with appts per 6/17 los.

## 2017-11-04 NOTE — Patient Instructions (Signed)
Implanted Port Home Guide An implanted port is a type of central line that is placed under the skin. Central lines are used to provide IV access when treatment or nutrition needs to be given through a person's veins. Implanted ports are used for long-term IV access. An implanted port may be placed because:  You need IV medicine that would be irritating to the small veins in your hands or arms.  You need long-term IV medicines, such as antibiotics.  You need IV nutrition for a long period.  You need frequent blood draws for lab tests.  You need dialysis.  Implanted ports are usually placed in the chest area, but they can also be placed in the upper arm, the abdomen, or the leg. An implanted port has two main parts:  Reservoir. The reservoir is round and will appear as a small, raised area under your skin. The reservoir is the part where a needle is inserted to give medicines or draw blood.  Catheter. The catheter is a thin, flexible tube that extends from the reservoir. The catheter is placed into a large vein. Medicine that is inserted into the reservoir goes into the catheter and then into the vein.  How will I care for my incision site? Do not get the incision site wet. Bathe or shower as directed by your health care provider. How is my port accessed? Special steps must be taken to access the port:  Before the port is accessed, a numbing cream can be placed on the skin. This helps numb the skin over the port site.  Your health care provider uses a sterile technique to access the port. ? Your health care provider must put on a mask and sterile gloves. ? The skin over your port is cleaned carefully with an antiseptic and allowed to dry. ? The port is gently pinched between sterile gloves, and a needle is inserted into the port.  Only "non-coring" port needles should be used to access the port. Once the port is accessed, a blood return should be checked. This helps ensure that the port  is in the vein and is not clogged.  If your port needs to remain accessed for a constant infusion, a clear (transparent) bandage will be placed over the needle site. The bandage and needle will need to be changed every week, or as directed by your health care provider.  Keep the bandage covering the needle clean and dry. Do not get it wet. Follow your health care provider's instructions on how to take a shower or bath while the port is accessed.  If your port does not need to stay accessed, no bandage is needed over the port.  What is flushing? Flushing helps keep the port from getting clogged. Follow your health care provider's instructions on how and when to flush the port. Ports are usually flushed with saline solution or a medicine called heparin. The need for flushing will depend on how the port is used.  If the port is used for intermittent medicines or blood draws, the port will need to be flushed: ? After medicines have been given. ? After blood has been drawn. ? As part of routine maintenance.  If a constant infusion is running, the port may not need to be flushed.  How long will my port stay implanted? The port can stay in for as long as your health care provider thinks it is needed. When it is time for the port to come out, surgery will be   done to remove it. The procedure is similar to the one performed when the port was put in. When should I seek immediate medical care? When you have an implanted port, you should seek immediate medical care if:  You notice a bad smell coming from the incision site.  You have swelling, redness, or drainage at the incision site.  You have more swelling or pain at the port site or the surrounding area.  You have a fever that is not controlled with medicine.  This information is not intended to replace advice given to you by your health care provider. Make sure you discuss any questions you have with your health care provider. Document  Released: 05/07/2005 Document Revised: 10/13/2015 Document Reviewed: 01/12/2013 Elsevier Interactive Patient Education  2017 Elsevier Inc.  

## 2017-11-04 NOTE — Progress Notes (Signed)
[place] Work  Holiday representative met with patient in the infusion room to offer support and discuss transportation.  Patient stated a friend drove him to treatment today, but he was interested in additional resources.  CSW and patient discuss the [facility] transportation program and reviewed process.  Patient stated he was moving today, and would call CSW tomorrow with updated address.  Patient will confirm the need for transportation tomorrow when he calls with updated address.  Patient plans to meet with servant center on Wednesday to complete application for SSD/SSI.  CSW provided contact information and encouraged patient to call with questions or concerns.    [name], MSW, LCSW, OSW-C Clinical Social Worker [facility] [phone]

## 2017-11-05 ENCOUNTER — Encounter: Payer: Self-pay | Admitting: *Deleted

## 2017-11-05 NOTE — Progress Notes (Signed)
[place] Work  Holiday representative spoke with patient to confirm the need for transportation to his appointment on 11/06/17.  Patient provided his updated address, and transportation was scheduled through the [facility] transportation program.    [name], MSW, LCSW, OSW-C Clinical Social Worker [facility] [phone]

## 2017-11-05 NOTE — Progress Notes (Signed)
Called and left VM ande-mailed pt to inform that apt with Arrie Senate from [facility] is scheduled for 6/1`9/19 @ 11:30. Patient asked to arrive at 11:15 AM. Anderson Malta will help pt complete disability form and MCD application.

## 2017-11-05 NOTE — Progress Notes (Signed)
Spoke with pt directly. He is aware to be at [facility] at 11:15 AM on 11/06/17 to meet with Arrie Senate from [place] paperwork.

## 2017-11-06 ENCOUNTER — Other Ambulatory Visit: Payer: Self-pay | Admitting: Medical

## 2017-11-06 ENCOUNTER — Inpatient Hospital Stay: Payer: BLUE CROSS/BLUE SHIELD | Admitting: Medical

## 2017-11-06 ENCOUNTER — Inpatient Hospital Stay: Payer: BLUE CROSS/BLUE SHIELD

## 2017-11-06 ENCOUNTER — Telehealth: Payer: Self-pay

## 2017-11-06 VITALS — BP 94/70 | HR 123 | Temp 98.1°F | Resp 17 | Ht 70.0 in | Wt 122.7 lb

## 2017-11-06 DIAGNOSIS — R112 Nausea with vomiting, unspecified: Secondary | ICD-10-CM

## 2017-11-06 DIAGNOSIS — C799 Secondary malignant neoplasm of unspecified site: Secondary | ICD-10-CM

## 2017-11-06 DIAGNOSIS — R531 Weakness: Secondary | ICD-10-CM

## 2017-11-06 DIAGNOSIS — C801 Malignant (primary) neoplasm, unspecified: Secondary | ICD-10-CM | POA: Diagnosis not present

## 2017-11-06 LAB — CBC WITH DIFFERENTIAL (CANCER CENTER ONLY)
BASOS ABS: 0.1 10*3/uL (ref 0.0–0.1)
Basophils Relative: 1 %
EOS ABS: 0 10*3/uL (ref 0.0–0.5)
Eosinophils Relative: 0 %
HCT: 45.5 % (ref 38.4–49.9)
HEMOGLOBIN: 15.2 g/dL (ref 13.0–17.1)
LYMPHS ABS: 1.4 10*3/uL (ref 0.9–3.3)
Lymphocytes Relative: 15 %
MCH: 31.2 pg (ref 27.2–33.4)
MCHC: 33.5 g/dL (ref 32.0–36.0)
MCV: 93.2 fL (ref 79.3–98.0)
Monocytes Absolute: 0.5 10*3/uL (ref 0.1–0.9)
Monocytes Relative: 5 %
NEUTROS PCT: 79 %
Neutro Abs: 7.3 10*3/uL — ABNORMAL HIGH (ref 1.5–6.5)
PLATELETS: 493 10*3/uL — AB (ref 140–400)
RBC: 4.88 MIL/uL (ref 4.20–5.82)
RDW: 13.1 % (ref 11.0–14.6)
WBC: 9.2 10*3/uL (ref 4.0–10.3)

## 2017-11-06 LAB — CMP (CANCER CENTER ONLY)
ALT: 47 U/L (ref 0–55)
AST: 46 U/L — ABNORMAL HIGH (ref 5–34)
Albumin: 3.1 g/dL — ABNORMAL LOW (ref 3.5–5.0)
Alkaline Phosphatase: 304 U/L — ABNORMAL HIGH (ref 40–150)
Anion gap: 11 (ref 3–11)
BILIRUBIN TOTAL: 0.8 mg/dL (ref 0.2–1.2)
BUN: 14 mg/dL (ref 7–26)
CO2: 29 mmol/L (ref 22–29)
CREATININE: 0.99 mg/dL (ref 0.70–1.30)
Calcium: 9.7 mg/dL (ref 8.4–10.4)
Chloride: 101 mmol/L (ref 98–109)
GFR, Estimated: >60 mL/min (ref >60)
Glucose, Bld: 118 mg/dL (ref 70–140)
POTASSIUM: 4.6 mmol/L (ref 3.5–5.1)
Sodium: 141 mmol/L (ref 136–145)
TOTAL PROTEIN: 6.9 g/dL (ref 6.4–8.3)

## 2017-11-06 LAB — MAGNESIUM: MAGNESIUM: 1.9 mg/dL (ref 1.7–2.4)

## 2017-11-06 MED ORDER — DEXAMETHASONE SODIUM PHOSPHATE 10 MG/ML IJ SOLN
INTRAMUSCULAR | Status: AC
  Filled 2017-11-06: qty 1

## 2017-11-06 MED ORDER — DEXAMETHASONE SODIUM PHOSPHATE 10 MG/ML IJ SOLN
10.0000 mg | Freq: Once | INTRAMUSCULAR | Status: AC
  Administered 2017-11-06: 10 mg via INTRAVENOUS

## 2017-11-06 MED ORDER — SODIUM CHLORIDE 0.9 % IV SOLN: 10.0000 mg | Freq: Once | INTRAVENOUS | Status: DC

## 2017-11-06 MED ORDER — DEXAMETHASONE 4 MG PO TABS: 4.0000 mg | ORAL_TABLET | Freq: Two times a day (BID) | ORAL | 0 refills | Status: DC

## 2017-11-06 MED ORDER — LORAZEPAM 2 MG/ML IJ SOLN
INTRAMUSCULAR | Status: AC
  Filled 2017-11-06: qty 1

## 2017-11-06 MED ORDER — HEPARIN SOD (PORK) LOCK FLUSH 100 UNIT/ML IV SOLN
500.0000 [IU] | Freq: Once | INTRAVENOUS | Status: AC | PRN
  Administered 2017-11-06: 500 [IU]
  Filled 2017-11-06: qty 5

## 2017-11-06 MED ORDER — LORAZEPAM 2 MG/ML IJ SOLN
0.5000 mg | Freq: Once | INTRAMUSCULAR | Status: AC
  Administered 2017-11-06: 0.5 mg via INTRAVENOUS

## 2017-11-06 MED ORDER — LORAZEPAM 0.5 MG PO TABS: 0.5000 mg | ORAL_TABLET | Freq: Three times a day (TID) | ORAL | 0 refills | Status: DC

## 2017-11-06 MED ORDER — SODIUM CHLORIDE 0.9 % IV SOLN
INTRAVENOUS | Status: DC
  Administered 2017-11-06: 13:00:00 via INTRAVENOUS

## 2017-11-06 MED ORDER — SODIUM CHLORIDE 0.9% FLUSH
10.0000 mL | INTRAVENOUS | Status: DC | PRN
  Administered 2017-11-06: 10 mL
  Filled 2017-11-06: qty 10

## 2017-11-06 NOTE — Progress Notes (Signed)
  Oncology Nurse Navigator Documentation  Navigator Location: [facility] (11/06/17 1145)   )Navigator Encounter Type: Other(Met with pt to start Disability paperwork) (11/06/17 1145)    Met pt in lobby at 11:15 to introduce him to [employer] from [facility] with the goal of starting the disability paperwork for SSI. Patient nauseated. Patient did not feel well enough to start paperwork. Anderson Malta provided her contact information to pt so that he can call when he is feeling better. She did ask patient to contact her before the end of the month.  I met with pt and let him know that Sandi Mealy PA would be seeing him to evaluate nausea. He will have his 5FU pump DC'd as well. Patient educated on use of his nausea medications.                         Interventions: Psycho-social support;Disability/FMLA (11/06/17 1145)            Acuity: Level 4 (11/06/17 1145)     Acuity Level 3: Emotional needs;Absence of support (11/06/17 1145) Acuity Level 4: Low health literacy;Ongoing guidance, education and support provided throughout the patient's treatment (11/06/17 1145) Time Spent with Patient: 60 (11/06/17 1145)

## 2017-11-06 NOTE — Patient Instructions (Signed)
Implanted Port Home Guide An implanted port is a type of central line that is placed under the skin. Central lines are used to provide IV access when treatment or nutrition needs to be given through a person's veins. Implanted ports are used for long-term IV access. An implanted port may be placed because:  You need IV medicine that would be irritating to the small veins in your hands or arms.  You need long-term IV medicines, such as antibiotics.  You need IV nutrition for a long period.  You need frequent blood draws for lab tests.  You need dialysis.  Implanted ports are usually placed in the chest area, but they can also be placed in the upper arm, the abdomen, or the leg. An implanted port has two main parts:  Reservoir. The reservoir is round and will appear as a small, raised area under your skin. The reservoir is the part where a needle is inserted to give medicines or draw blood.  Catheter. The catheter is a thin, flexible tube that extends from the reservoir. The catheter is placed into a large vein. Medicine that is inserted into the reservoir goes into the catheter and then into the vein.  How will I care for my incision site? Do not get the incision site wet. Bathe or shower as directed by your health care provider. How is my port accessed? Special steps must be taken to access the port:  Before the port is accessed, a numbing cream can be placed on the skin. This helps numb the skin over the port site.  Your health care provider uses a sterile technique to access the port. ? Your health care provider must put on a mask and sterile gloves. ? The skin over your port is cleaned carefully with an antiseptic and allowed to dry. ? The port is gently pinched between sterile gloves, and a needle is inserted into the port.  Only "non-coring" port needles should be used to access the port. Once the port is accessed, a blood return should be checked. This helps ensure that the port  is in the vein and is not clogged.  If your port needs to remain accessed for a constant infusion, a clear (transparent) bandage will be placed over the needle site. The bandage and needle will need to be changed every week, or as directed by your health care provider.  Keep the bandage covering the needle clean and dry. Do not get it wet. Follow your health care provider's instructions on how to take a shower or bath while the port is accessed.  If your port does not need to stay accessed, no bandage is needed over the port.  What is flushing? Flushing helps keep the port from getting clogged. Follow your health care provider's instructions on how and when to flush the port. Ports are usually flushed with saline solution or a medicine called heparin. The need for flushing will depend on how the port is used.  If the port is used for intermittent medicines or blood draws, the port will need to be flushed: ? After medicines have been given. ? After blood has been drawn. ? As part of routine maintenance.  If a constant infusion is running, the port may not need to be flushed.  How long will my port stay implanted? The port can stay in for as long as your health care provider thinks it is needed. When it is time for the port to come out, surgery will be   done to remove it. The procedure is similar to the one performed when the port was put in. When should I seek immediate medical care? When you have an implanted port, you should seek immediate medical care if:  You notice a bad smell coming from the incision site.  You have swelling, redness, or drainage at the incision site.  You have more swelling or pain at the port site or the surrounding area.  You have a fever that is not controlled with medicine.  This information is not intended to replace advice given to you by your health care provider. Make sure you discuss any questions you have with your health care provider. Document  Released: 05/07/2005 Document Revised: 10/13/2015 Document Reviewed: 01/12/2013 Elsevier Interactive Patient Education  2017 Elsevier Inc.  

## 2017-11-06 NOTE — Progress Notes (Signed)
Pt reports improvement in nausea after receiving IVF & nausea medication.  Denies pain.  A&OX4.  WC to front where he will take Melburn Popper to previous location.

## 2017-11-06 NOTE — Telephone Encounter (Signed)
err

## 2017-11-06 NOTE — Telephone Encounter (Signed)
-----   Message from Flo Shanks, RN sent at 11/04/2017  3:10 PM EDT ----- Regarding: Dr. Benay Spice First time chemo First time Oxaliplatin, Leucovorin and 5FU.

## 2017-11-07 ENCOUNTER — Telehealth: Payer: Self-pay | Admitting: General Practice

## 2017-11-07 ENCOUNTER — Encounter: Payer: Self-pay | Admitting: General Practice

## 2017-11-07 ENCOUNTER — Telehealth: Payer: Self-pay | Admitting: Medical

## 2017-11-07 NOTE — Progress Notes (Signed)
[facility]pt[facility] Progress Note   Derek Lloyd [id number] 12/06/1970 47 y.o.  [name] is managed by Dr. Dominica Severin B. Sherrill   Actively treated with chemotherapy/immunotherapy: yes  Current Therapy: FOLFOX  Last Treated: 11/04/2017 (cycle 1, day 1)  Assessment: Plan:    Nausea and vomiting, intractability of vomiting not specified, unspecified vomiting type - Plan: 0.9 %  sodium chloride infusion, LORazepam (ATIVAN) injection 0.5 mg, dexamethasone (DECADRON) injection 10 mg, dexamethasone (DECADRON) 4 MG tablet, LORazepam (ATIVAN) 0.5 MG tablet, DISCONTINUED: dexamethasone (DECADRON) 10 mg in sodium chloride 0.9 % 50 mL IVPB  Metastatic carcinoma (HCC)   Nausea and vomiting: Plans are to add Emend to the patient's future cycles of chemotherapy.  He was given Ativan 0.5 mg IV and Decadron 10 mg IV today.  He was also given 1 L of normal saline.  A prescription for Decadron 4 mg p.o. twice daily x5 days and a prescription for Ativan 0.5 mg p.o./sublingual 3 times daily as needed were sent to the patient's pharmacy.  Mr. Shough was told not to take Ativan within 2 hours of Xanax.  Metastatic carcinoma of the colon: The patient is status post cycle 1, day 1 of FOLFOX which was dosed on 11/04/2017.  He will return for his next chemotherapy on 11/18/2017.  Suspected depression: I have suggested to the patient that he might consider speaking with our chaplain Lorrin Jackson and have suggested that he might consider an antidepressant.  He was given my contact information and will contact me should he elect to pursue either of these.  Please see After Visit Summary for patient specific instructions.  Future Appointments  Date Time Provider [place]  11/18/2017 11:30 AM [facility] LAB 3 [facility] None  11/18/2017 12:00 PM [facility] J32 DNS [facility] None  11/18/2017 12:15 PM Ladell Pier, MD [facility] None  11/18/2017  1:30 PM [facility] F18 [facility] None    11/18/2017  3:15 PM Karie Mainland, RD [facility] None  12/02/2017 10:30 AM [facility] LAB 4 [facility] None  12/02/2017 10:45 AM [facility] [place] FLUSH [facility] None  12/02/2017 11:00 AM Ladell Pier, MD [facility] None  12/02/2017 12:00 PM [facility] INFUSION [facility] None  12/02/2017  2:30 PM Karie Mainland, RD [facility] None  12/04/2017  2:15 PM [facility] [place] FLUSH [facility] None    No orders of the defined types were placed in this encounter.      Subjective:   Patient ID:  Derek Lloyd is a 47 y.o. (DOB Jul 16, 1970) male.  Chief Complaint: No chief complaint on file.   HPI Derek Lloyd is a 47 year old male with a history of a metastatic colon cancer who is managed by Dr. Dominica Severin B. Sherrill and is status post cycle 1, day 1 of FOLFOX which was dosed on 11/04/2017.  He presents to the office today with fatigue, dizziness, nausea and vomiting, dyspnea on exertion, and shortness of breath since yesterday.  He denies fevers, chills, sweats, diarrhea, or constipation.  He has a prescription for Compazine.  Medications: I have reviewed the patient's current medications.  Allergies:  Allergies  Allergen Reactions  . Asa [Aspirin] Rash    Childhood reaction; but tolerates well now  . Piperacillin-Tazobactam In Dex Rash    Noted on May 2019 admission.  Tolerates Cephalosporins.    Past Medical History:  Diagnosis Date  . Abdominal pain 09/2017  . Anxiety attack   . Headache    "frequently" (08/14/2017)  . Heart murmur    "when I was  a kid" (08/14/2017)  . Small bowel obstruction ([place]) 08/13/2017    Past Surgical History:  Procedure Laterality Date  . BIOPSY  10/02/2017   Procedure: BIOPSY;  Surgeon: [name], MD;  Location: [facility] ENDOSCOPY;  Service: Gastroenterology;;  . COLONOSCOPY WITH PROPOFOL N/A 10/02/2017   Procedure: COLONOSCOPY WITH PROPOFOL;  Surgeon: [name], MD;  Location: [place];  Service: Gastroenterology;  Laterality: N/A;  . FINGER  SURGERY Left    "thumb"  . IR IMAGING GUIDED PORT INSERTION  11/01/2017  . IR PARACENTESIS  09/24/2017  . IR US GUIDE VASC ACCESS RIGHT  11/01/2017  . POLYPECTOMY N/A 10/02/2017   Procedure: POLYPECTOMY;  Surgeon: [name], MD;  Location: [place];  Service: Gastroenterology;  Laterality: N/A;  . SUBMUCOSAL INJECTION  10/02/2017   Procedure: SUBMUCOSAL INJECTION;  Surgeon: [name], MD;  Location: [facility] ENDOSCOPY;  Service: Gastroenterology;;  . TYMPANOSTOMY TUBE PLACEMENT Bilateral     Family History  Problem Relation Age of Onset  . Prostate cancer Paternal Grandfather   . Crohn's disease Neg Hx     Social History   Socioeconomic History  . Marital status: Single    Spouse name: Not on file  . Number of children: Not on file  . Years of education: Not on file  . Highest education level: Not on file  Occupational History  . Not on file  Social Needs  . Financial resource strain: Not on file  . Food insecurity:    Worry: Not on file    Inability: Not on file  . Transportation needs:    Medical: Not on file    Non-medical: Not on file  Tobacco Use  . Smoking status: Current Some Day Smoker    Packs/day: 1.00    Types: Cigarettes    Last attempt to quit: 09/28/2016    Years since quitting: 1.1  . Smokeless tobacco: Never Used  . Tobacco comment: 08/14/2017 "I quit; can't tell you when I quit or when I started"  Substance and Sexual Activity  . Alcohol use: Yes    Comment: 08/14/2017 "I drink when I feel like it; nothing recently"  . Drug use: No  . Sexual activity: Not on file  Lifestyle  . Physical activity:    Days per week: Not on file    Minutes per session: Not on file  . Stress: Not on file  Relationships  . Social connections:    Talks on phone: Not on file    Gets together: Not on file    Attends religious service: Not on file    Active member of club or organization: Not on file    Attends meetings of clubs or organizations: Not on file     Relationship status: Not on file  . Intimate partner violence:    Fear of current or ex partner: Not on file    Emotionally abused: Not on file    Physically abused: Not on file    Forced sexual activity: Not on file  Other Topics Concern  . Not on file  Social History Narrative  . Not on file    Past Medical History, Surgical history, Social history, and Family history were reviewed and updated as appropriate.   Please see review of systems for further details on the patient's review from today.   Review of Systems:  Review of Systems  Constitutional: Positive for appetite change and fatigue. Negative for chills, diaphoresis and fever.  HENT: Negative for mouth sores and trouble  swallowing.   Respiratory: Positive for shortness of breath. Negative for cough, choking, chest tightness and wheezing.   Cardiovascular: Negative for chest pain, palpitations and leg swelling.  Gastrointestinal: Positive for nausea and vomiting. Negative for constipation and diarrhea.  Neurological: Positive for dizziness.  Psychiatric/Behavioral: Positive for sleep disturbance.    Objective:   Physical Exam:  BP 94/70 (BP Location: Right Arm, Patient Position: Sitting)   Pulse (!) 123   Temp 98.1 F (36.7 C) (Oral)   Resp 17   Ht 5' 10"  (1.778 m)   Wt 122 lb 11.2 oz (55.7 kg)   SpO2 99%   BMI 17.61 kg/m  ECOG: 0  Physical Exam  Constitutional: No distress.  The patient is an adult male with a flat affect who appears to be in no apparent distress but does appear to be depressed.  HENT:  Head: Normocephalic and atraumatic.  Cardiovascular: Regular rhythm, S1 normal and S2 normal. Tachycardia present. Exam reveals no friction rub.  No murmur heard. Pulmonary/Chest: Effort normal and breath sounds normal. No stridor. No respiratory distress. He has no wheezes. He has no rales.  Abdominal: Soft. Bowel sounds are normal. He exhibits no distension and no mass. There is no tenderness. There is no  rebound and no guarding.  Neurological: He is alert.  Skin: Skin is warm and dry. He is not diaphoretic.    Lab Review:     Component Value Date/Time   NA 141 11/06/2017 1205   K 4.6 11/06/2017 1205   CL 101 11/06/2017 1205   CO2 29 11/06/2017 1205   GLUCOSE 118 11/06/2017 1205   BUN 14 11/06/2017 1205   CREATININE 0.99 11/06/2017 1205   CALCIUM 9.7 11/06/2017 1205   PROT 6.9 11/06/2017 1205   ALBUMIN 3.1 (L) 11/06/2017 1205   AST 46 (H) 11/06/2017 1205   ALT 47 11/06/2017 1205   ALKPHOS 304 (H) 11/06/2017 1205   BILITOT 0.8 11/06/2017 1205   GFRNONAA >60 11/06/2017 1205   GFRAA >60 11/06/2017 1205       Component Value Date/Time   WBC 9.2 11/06/2017 1205   WBC 8.2 11/01/2017 0907   RBC 4.88 11/06/2017 1205   HGB 15.2 11/06/2017 1205   HCT 45.5 11/06/2017 1205   PLT 493 (H) 11/06/2017 1205   MCV 93.2 11/06/2017 1205   MCH 31.2 11/06/2017 1205   MCHC 33.5 11/06/2017 1205   RDW 13.1 11/06/2017 1205   LYMPHSABS 1.4 11/06/2017 1205   MONOABS 0.5 11/06/2017 1205   EOSABS 0.0 11/06/2017 1205   BASOSABS 0.1 11/06/2017 1205   -------------------------------  Imaging from last 24 hours (if applicable):  Radiology interpretation: Ct Chest W Contrast  Result Date: 11/01/2017 CLINICAL DATA:  Chest pain, shortness of breath, smoker EXAM: CT CHEST WITH CONTRAST TECHNIQUE: Multidetector CT imaging of the chest was performed during intravenous contrast administration. Sagittal and coronal MPR images reconstructed from axial data set. CONTRAST:  132m OMNIPAQUE IOHEXOL 300 MG/ML  SOLN IV COMPARISON:  None FINDINGS: Cardiovascular: Aorta normal caliber. RIGHT jugular Port-A-Cath with tip in superior RIGHT atrium. Heart normal size. No pericardial effusion. Mediastinum/Nodes: Base of cervical region normal appearance. Few normal size mediastinal lymph nodes without thoracic adenopathy. Esophagus unremarkable. Lungs/Pleura: Minimal dependent atelectasis in the posterior lower lobes  bilaterally. Small pneumatocele in RIGHT lower lobe. Questionable pleural nodules versus atelectasis at anterior lung bases 14 x 8 mm LEFT image 132 and 20 x 6 mm RIGHT image 129. Lungs otherwise clear. No pulmonary infiltrate, pleural effusion or  pneumothorax. Upper Abdomen: Significant ascites in upper abdomen. Visualized solid organs and upper abdomen unremarkable. Musculoskeletal: No focal osseous lesions. IMPRESSION: Questionable small pleural nodules versus atelectasis at the anterior lung bases bilaterally. Minimal dependent atelectasis in the posterior lungs. No other significant intrathoracic abnormalities. Significant ascites in upper abdomen. Electronically Signed   By: Lavonia Dana M.D.   On: 11/01/2017 16:02   Ir US Guide Vasc Access Right  Result Date: 11/01/2017 CLINICAL DATA:  Metastatic colon carcinoma and need for porta cath for chemotherapy. EXAM: IMPLANTED PORT A CATH PLACEMENT WITH ULTRASOUND AND FLUOROSCOPIC GUIDANCE ANESTHESIA/SEDATION: 4.0 mg IV Versed; 150 mcg IV Fentanyl Total Moderate Sedation Time:  25 minutes The patient's level of consciousness and physiologic status were continuously monitored during the procedure by Radiology nursing. Additional Medications: 1 g IV vancomycin. FLUOROSCOPY TIME:  30 seconds.  0.9 mGy. PROCEDURE: The procedure, risks, benefits, and alternatives were explained to the patient. Questions regarding the procedure were encouraged and answered. The patient understands and consents to the procedure. A time-out was performed prior to initiating the procedure. Ultrasound was utilized to confirm patency of the right internal jugular vein. The right neck and chest were prepped with chlorhexidine in a sterile fashion, and a sterile drape was applied covering the operative field. Maximum barrier sterile technique with sterile gowns and gloves were used for the procedure. Local anesthesia was provided with 1% lidocaine. After creating a small venotomy incision, a  21 gauge needle was advanced into the right internal jugular vein under direct, real-time ultrasound guidance. Ultrasound image documentation was performed. After securing guidewire access, an 8 Fr dilator was placed. A J-wire was kinked to measure appropriate catheter length. A subcutaneous port pocket was then created along the upper chest wall utilizing sharp and blunt dissection. Portable cautery was utilized. The pocket was irrigated with sterile saline. A single lumen power injectable port was chosen for placement. The 8 Fr catheter was tunneled from the port pocket site to the venotomy incision. The port was placed in the pocket. External catheter was trimmed to appropriate length based on guidewire measurement. At the venotomy, an 8 Fr peel-away sheath was placed over a guidewire. The catheter was then placed through the sheath and the sheath removed. Final catheter positioning was confirmed and documented with a fluoroscopic spot image. The port was accessed with a needle and aspirated and flushed with heparinized saline. The access needle was removed. The venotomy and port pocket incisions were closed with subcutaneous 3-0 Monocryl and subcuticular 4-0 Vicryl. Dermabond was applied to both incisions. COMPLICATIONS: COMPLICATIONS None FINDINGS: After catheter placement, the tip lies at the cavo-atrial junction. The catheter aspirates normally and is ready for immediate use. IMPRESSION: Placement of single lumen port a cath via right internal jugular vein. The catheter tip lies at the cavo-atrial junction. A power injectable port a cath was placed and is ready for immediate use. Electronically Signed   By: Aletta Edouard M.D.   On: 11/01/2017 14:32   Ct Biopsy  Result Date: 11/01/2017 CLINICAL DATA:  Colon carcinoma, ascites and evidence of peritoneal tumor by CT. The patient presents for peritoneal tumor biopsy. EXAM: 1. CT-GUIDED PARACENTESIS 2. CT GUIDED CORE BIOPSY OF PERITONEAL MASS  ANESTHESIA/SEDATION: 2.0 mg IV Versed; 50 mcg IV Fentanyl Total Moderate Sedation Time:  15 minutes. The patient's level of consciousness and physiologic status were continuously monitored during the procedure by Radiology nursing. PROCEDURE: The procedure risks, benefits, and alternatives were explained to the patient. Questions regarding the procedure  were encouraged and answered. The patient understands and consents to the procedure. A time-out was performed prior to initiating the procedure. Initial CT of the abdomen was performed in a supine position. The left abdominal wall was prepped with chlorhexidine in a sterile fashion, and a sterile drape was applied covering the operative field. A sterile gown and sterile gloves were used for the procedure. Local anesthesia was provided with 1% Lidocaine. Paracentesis was performed with advancement of a 6 French Safe-T-Centesis catheter into the left peritoneal cavity. Fluid was removed with vacuum bottles. A large fluid sample was sent for cytologic analysis. After additional CT, a 17 gauge needle was advanced under CT guidance into the left lateral peritoneal cavity. Coaxial 18 gauge core biopsy was performed with 2 samples obtained. Samples were submitted in formalin. Additional CT was performed. The centesis catheter was removed. COMPLICATIONS: None FINDINGS: Initial CT demonstrates significant increase in intra-abdominal ascites since prior CT on 09/26/2017. Peritoneal biopsy was not possible without fluid removal and therefore paracentesis was performed. This yielded 3.5 L of yellowish colored fluid. A sample was sent for cytologic analysis. It was difficult to define significant solid tumor in the peritoneal cavity. A region of soft tissue was targeted just lateral to the splenic flexure of the colon. Core biopsy yielded sparse amounts of fragmented tissue. IMPRESSION: 1. Significant increase in amount of ascites in the peritoneal cavity. Paracentesis was  performed prior to peritoneal biopsy. 3.5 L of fluid was removed and a sample sent for cytologic analysis. 2. CT-guided core biopsy performed of soft tissue in the peritoneal cavity lateral to the splenic flexure of the colon. This only yielded sparse amounts of fragmented tissue. Electronically Signed   By: Aletta Edouard M.D.   On: 11/01/2017 14:43   Ct Image Guided Fluid Drain By Catheter  Result Date: 11/01/2017 CLINICAL DATA:  Colon carcinoma, ascites and evidence of peritoneal tumor by CT. The patient presents for peritoneal tumor biopsy. EXAM: 1. CT-GUIDED PARACENTESIS 2. CT GUIDED CORE BIOPSY OF PERITONEAL MASS ANESTHESIA/SEDATION: 2.0 mg IV Versed; 50 mcg IV Fentanyl Total Moderate Sedation Time:  15 minutes. The patient's level of consciousness and physiologic status were continuously monitored during the procedure by Radiology nursing. PROCEDURE: The procedure risks, benefits, and alternatives were explained to the patient. Questions regarding the procedure were encouraged and answered. The patient understands and consents to the procedure. A time-out was performed prior to initiating the procedure. Initial CT of the abdomen was performed in a supine position. The left abdominal wall was prepped with chlorhexidine in a sterile fashion, and a sterile drape was applied covering the operative field. A sterile gown and sterile gloves were used for the procedure. Local anesthesia was provided with 1% Lidocaine. Paracentesis was performed with advancement of a 6 French Safe-T-Centesis catheter into the left peritoneal cavity. Fluid was removed with vacuum bottles. A large fluid sample was sent for cytologic analysis. After additional CT, a 17 gauge needle was advanced under CT guidance into the left lateral peritoneal cavity. Coaxial 18 gauge core biopsy was performed with 2 samples obtained. Samples were submitted in formalin. Additional CT was performed. The centesis catheter was removed. COMPLICATIONS:  None FINDINGS: Initial CT demonstrates significant increase in intra-abdominal ascites since prior CT on 09/26/2017. Peritoneal biopsy was not possible without fluid removal and therefore paracentesis was performed. This yielded 3.5 L of yellowish colored fluid. A sample was sent for cytologic analysis. It was difficult to define significant solid tumor in the peritoneal cavity. A region  of soft tissue was targeted just lateral to the splenic flexure of the colon. Core biopsy yielded sparse amounts of fragmented tissue. IMPRESSION: 1. Significant increase in amount of ascites in the peritoneal cavity. Paracentesis was performed prior to peritoneal biopsy. 3.5 L of fluid was removed and a sample sent for cytologic analysis. 2. CT-guided core biopsy performed of soft tissue in the peritoneal cavity lateral to the splenic flexure of the colon. This only yielded sparse amounts of fragmented tissue. Electronically Signed   By: Aletta Edouard M.D.   On: 11/01/2017 14:43   Ir Imaging Guided Port Insertion  Result Date: 11/01/2017 CLINICAL DATA:  Metastatic colon carcinoma and need for porta cath for chemotherapy. EXAM: IMPLANTED PORT A CATH PLACEMENT WITH ULTRASOUND AND FLUOROSCOPIC GUIDANCE ANESTHESIA/SEDATION: 4.0 mg IV Versed; 150 mcg IV Fentanyl Total Moderate Sedation Time:  25 minutes The patient's level of consciousness and physiologic status were continuously monitored during the procedure by Radiology nursing. Additional Medications: 1 g IV vancomycin. FLUOROSCOPY TIME:  30 seconds.  0.9 mGy. PROCEDURE: The procedure, risks, benefits, and alternatives were explained to the patient. Questions regarding the procedure were encouraged and answered. The patient understands and consents to the procedure. A time-out was performed prior to initiating the procedure. Ultrasound was utilized to confirm patency of the right internal jugular vein. The right neck and chest were prepped with chlorhexidine in a sterile  fashion, and a sterile drape was applied covering the operative field. Maximum barrier sterile technique with sterile gowns and gloves were used for the procedure. Local anesthesia was provided with 1% lidocaine. After creating a small venotomy incision, a 21 gauge needle was advanced into the right internal jugular vein under direct, real-time ultrasound guidance. Ultrasound image documentation was performed. After securing guidewire access, an 8 Fr dilator was placed. A J-wire was kinked to measure appropriate catheter length. A subcutaneous port pocket was then created along the upper chest wall utilizing sharp and blunt dissection. Portable cautery was utilized. The pocket was irrigated with sterile saline. A single lumen power injectable port was chosen for placement. The 8 Fr catheter was tunneled from the port pocket site to the venotomy incision. The port was placed in the pocket. External catheter was trimmed to appropriate length based on guidewire measurement. At the venotomy, an 8 Fr peel-away sheath was placed over a guidewire. The catheter was then placed through the sheath and the sheath removed. Final catheter positioning was confirmed and documented with a fluoroscopic spot image. The port was accessed with a needle and aspirated and flushed with heparinized saline. The access needle was removed. The venotomy and port pocket incisions were closed with subcutaneous 3-0 Monocryl and subcuticular 4-0 Vicryl. Dermabond was applied to both incisions. COMPLICATIONS: COMPLICATIONS None FINDINGS: After catheter placement, the tip lies at the cavo-atrial junction. The catheter aspirates normally and is ready for immediate use. IMPRESSION: Placement of single lumen port a cath via right internal jugular vein. The catheter tip lies at the cavo-atrial junction. A power injectable port a cath was placed and is ready for immediate use. Electronically Signed   By: Aletta Edouard M.D.   On: 11/01/2017 14:32         This case was discussed with Dr. Benay Spice. He expressed agreement with my management of this patient.

## 2017-11-07 NOTE — Telephone Encounter (Signed)
[facility] CSW Progress Note  Message received that patient may need help w transport to upcoming appt w surgical oncologist at [facility] can refer to [place] [facility] - left VM requesting patient to call back to discuss/consent to this option.  Edwyna Shell, LCSW Clinical Social Worker Phone:  [phone]

## 2017-11-07 NOTE — Telephone Encounter (Signed)
No 6/19 los

## 2017-11-07 NOTE — Progress Notes (Signed)
[place] CSW Progress Note  Request received to facilitate transport for patient to upcoming appointment w Dr Clovis Riley at [facility].  Per office, address is [address], [place], [place].  Want patient to arrive at 9:45 and allow at least one hour for appt.  CSW has left VM for patient to discuss his needs for transport to this appt.  As [facility] does not offer any transport help, patient will be referred to [place] [facility] for transport help pending patient consent.  Edwyna Shell, LCSW Clinical Social Worker Phone:  [phone]

## 2017-11-11 LAB — ACID FAST CULTURE WITH REFLEXED SENSITIVITIES

## 2017-11-11 LAB — ACID FAST CULTURE WITH REFLEXED SENSITIVITIES (MYCOBACTERIA): Acid Fast Culture: NEGATIVE

## 2017-11-13 ENCOUNTER — Encounter (HOSPITAL_COMMUNITY): Payer: Self-pay | Admitting: Oncology

## 2017-11-15 ENCOUNTER — Encounter: Payer: Self-pay | Admitting: General Practice

## 2017-11-15 NOTE — Progress Notes (Signed)
[place] CSW Progress Note  Transport scheduled for appt on Monday 7/1.  Patient informed.  Edwyna Shell, LCSW Clinical Social Worker Phone:  [phone]

## 2017-11-17 ENCOUNTER — Other Ambulatory Visit: Payer: Self-pay | Admitting: Oncology

## 2017-11-18 ENCOUNTER — Inpatient Hospital Stay: Payer: BLUE CROSS/BLUE SHIELD

## 2017-11-18 ENCOUNTER — Encounter: Payer: Self-pay | Admitting: *Deleted

## 2017-11-18 ENCOUNTER — Inpatient Hospital Stay (HOSPITAL_BASED_OUTPATIENT_CLINIC_OR_DEPARTMENT_OTHER): Payer: BLUE CROSS/BLUE SHIELD | Admitting: Oncology

## 2017-11-18 ENCOUNTER — Telehealth: Payer: Self-pay

## 2017-11-18 ENCOUNTER — Ambulatory Visit: Payer: BLUE CROSS/BLUE SHIELD

## 2017-11-18 ENCOUNTER — Inpatient Hospital Stay: Payer: BLUE CROSS/BLUE SHIELD | Attending: Medical

## 2017-11-18 ENCOUNTER — Other Ambulatory Visit: Payer: BLUE CROSS/BLUE SHIELD

## 2017-11-18 ENCOUNTER — Inpatient Hospital Stay: Payer: BLUE CROSS/BLUE SHIELD | Admitting: Nutrition

## 2017-11-18 VITALS — BP 111/72 | HR 86 | Temp 98.6°F | Resp 18 | Ht 70.0 in | Wt 121.5 lb

## 2017-11-18 DIAGNOSIS — R180 Malignant ascites: Secondary | ICD-10-CM | POA: Insufficient documentation

## 2017-11-18 DIAGNOSIS — G893 Neoplasm related pain (acute) (chronic): Secondary | ICD-10-CM | POA: Diagnosis not present

## 2017-11-18 DIAGNOSIS — C801 Malignant (primary) neoplasm, unspecified: Secondary | ICD-10-CM | POA: Diagnosis not present

## 2017-11-18 DIAGNOSIS — Z5111 Encounter for antineoplastic chemotherapy: Secondary | ICD-10-CM | POA: Insufficient documentation

## 2017-11-18 DIAGNOSIS — R6884 Jaw pain: Secondary | ICD-10-CM | POA: Diagnosis not present

## 2017-11-18 DIAGNOSIS — R109 Unspecified abdominal pain: Secondary | ICD-10-CM | POA: Diagnosis not present

## 2017-11-18 DIAGNOSIS — C786 Secondary malignant neoplasm of retroperitoneum and peritoneum: Secondary | ICD-10-CM

## 2017-11-18 DIAGNOSIS — C799 Secondary malignant neoplasm of unspecified site: Secondary | ICD-10-CM

## 2017-11-18 DIAGNOSIS — R0789 Other chest pain: Secondary | ICD-10-CM | POA: Insufficient documentation

## 2017-11-18 DIAGNOSIS — Z452 Encounter for adjustment and management of vascular access device: Secondary | ICD-10-CM | POA: Diagnosis present

## 2017-11-18 LAB — CMP (CANCER CENTER ONLY)
ALBUMIN: 3.1 g/dL — AB (ref 3.5–5.0)
ALT: 44 U/L (ref 0–44)
ANION GAP: 8 (ref 5–15)
AST: 34 U/L (ref 15–41)
Alkaline Phosphatase: 250 U/L — ABNORMAL HIGH (ref 38–126)
BUN: 11 mg/dL (ref 6–20)
CO2: 27 mmol/L (ref 22–32)
Calcium: 9.1 mg/dL (ref 8.9–10.3)
Chloride: 104 mmol/L (ref 98–111)
Creatinine: 0.77 mg/dL (ref 0.61–1.24)
Glucose, Bld: 102 mg/dL — ABNORMAL HIGH (ref 70–99)
Potassium: 4.1 mmol/L (ref 3.5–5.1)
SODIUM: 139 mmol/L (ref 135–145)
Total Bilirubin: 0.4 mg/dL (ref 0.3–1.2)
Total Protein: 6.3 g/dL — ABNORMAL LOW (ref 6.5–8.1)

## 2017-11-18 LAB — CBC WITH DIFFERENTIAL (CANCER CENTER ONLY)
BASOS PCT: 1 %
Basophils Absolute: 0.1 10*3/uL (ref 0.0–0.1)
Eosinophils Absolute: 0.1 10*3/uL (ref 0.0–0.5)
Eosinophils Relative: 3 %
HCT: 37.8 % — ABNORMAL LOW (ref 38.4–49.9)
HEMOGLOBIN: 12.9 g/dL — AB (ref 13.0–17.1)
Lymphocytes Relative: 24 %
Lymphs Abs: 1.4 10*3/uL (ref 0.9–3.3)
MCH: 31.5 pg (ref 27.2–33.4)
MCHC: 34.2 g/dL (ref 32.0–36.0)
MCV: 92.1 fL (ref 79.3–98.0)
MONOS PCT: 11 %
Monocytes Absolute: 0.6 10*3/uL (ref 0.1–0.9)
NEUTROS PCT: 61 %
Neutro Abs: 3.5 10*3/uL (ref 1.5–6.5)
Platelet Count: 324 10*3/uL (ref 140–400)
RBC: 4.1 MIL/uL — ABNORMAL LOW (ref 4.20–5.82)
RDW: 13.2 % (ref 11.0–14.6)
WBC Count: 5.7 10*3/uL (ref 4.0–10.3)

## 2017-11-18 MED ORDER — FLUOROURACIL CHEMO INJECTION 2.5 GM/50ML
400.0000 mg/m2 | Freq: Once | INTRAVENOUS | Status: AC
  Administered 2017-11-18: 650 mg via INTRAVENOUS
  Filled 2017-11-18: qty 13

## 2017-11-18 MED ORDER — LEUCOVORIN CALCIUM INJECTION 350 MG
400.0000 mg/m2 | Freq: Once | INTRAVENOUS | Status: AC
  Administered 2017-11-18: 664 mg via INTRAVENOUS
  Filled 2017-11-18: qty 33.2

## 2017-11-18 MED ORDER — DEXAMETHASONE 4 MG PO TABS: 8.0000 mg | ORAL_TABLET | Freq: Two times a day (BID) | ORAL | 0 refills | Status: DC

## 2017-11-18 MED ORDER — SODIUM CHLORIDE 0.9% FLUSH
10.0000 mL | Freq: Once | INTRAVENOUS | Status: AC
  Administered 2017-11-18: 10 mL via INTRAVENOUS
  Filled 2017-11-18: qty 10

## 2017-11-18 MED ORDER — PALONOSETRON HCL INJECTION 0.25 MG/5ML
INTRAVENOUS | Status: AC
  Filled 2017-11-18: qty 5

## 2017-11-18 MED ORDER — SODIUM CHLORIDE 0.9% FLUSH
10.0000 mL | INTRAVENOUS | Status: DC | PRN
  Filled 2017-11-18: qty 10

## 2017-11-18 MED ORDER — SODIUM CHLORIDE 0.9 % IV SOLN
Freq: Once | INTRAVENOUS | Status: AC
  Administered 2017-11-18: 14:00:00 via INTRAVENOUS
  Filled 2017-11-18: qty 5

## 2017-11-18 MED ORDER — OXALIPLATIN CHEMO INJECTION 100 MG/20ML
85.0000 mg/m2 | Freq: Once | INTRAVENOUS | Status: AC
  Administered 2017-11-18: 140 mg via INTRAVENOUS
  Filled 2017-11-18: qty 20

## 2017-11-18 MED ORDER — HEPARIN SOD (PORK) LOCK FLUSH 100 UNIT/ML IV SOLN
500.0000 [IU] | Freq: Once | INTRAVENOUS | Status: DC | PRN
  Filled 2017-11-18: qty 5

## 2017-11-18 MED ORDER — PALONOSETRON HCL INJECTION 0.25 MG/5ML
0.2500 mg | Freq: Once | INTRAVENOUS | Status: AC
  Administered 2017-11-18: 0.25 mg via INTRAVENOUS

## 2017-11-18 MED ORDER — SODIUM CHLORIDE 0.9 % IV SOLN
2400.0000 mg/m2 | INTRAVENOUS | Status: DC
  Administered 2017-11-18: 4000 mg via INTRAVENOUS
  Filled 2017-11-18: qty 80

## 2017-11-18 MED ORDER — DEXTROSE 5 % IV SOLN
Freq: Once | INTRAVENOUS | Status: AC
  Administered 2017-11-18: 14:00:00 via INTRAVENOUS

## 2017-11-18 MED ORDER — OXYCODONE HCL 5 MG PO TABS: 5.0000 mg | ORAL_TABLET | Freq: Four times a day (QID) | ORAL | 0 refills | Status: DC | PRN

## 2017-11-18 NOTE — Progress Notes (Signed)
[place] OFFICE PROGRESS NOTE   Diagnosis: Metastatic carcinoma  INTERVAL HISTORY:   Derek Lloyd returns as scheduled.  He completed cycle 1 FOLFOX on 11/04/2017.  He developed nausea and vomiting beginning on 11/05/2017.  He was seen in the symptom management clinic on 07/09/2017.  His symptoms improved after Decadron, Ativan, and IV fluids. He reports feeling better in general.  Less abdominal pain.  He continues to take 10 mg of oxycodone twice per day for relief of abdominal pain.  He has burning discomfort at the lateral right thigh.  He wonders whether this is related to nerve injury. No mouth sores, diarrhea, or peripheral numbness.  Objective:  Vital signs in last 24 hours:  Blood pressure 111/72, pulse 86, temperature 98.6 F (37 C), temperature source Oral, resp. rate 18, height 5' 10"  (1.778 m), weight 121 lb 8 oz (55.1 kg), SpO2 99 %.    HEENT: No thrush or ulcers Resp: Lungs clear bilaterally Cardio: Regular rate and rhythm GI: Distended, no mass Vascular: No leg edema  Skin: Right leg without erythema or rash Musculoskeletal: No mass the right thigh.  No pain with motion at the right hip.  Mild tenderness at the lateral right thigh.  Portacath/PICC-without erythema  Lab Results:  Lab Results  Component Value Date   WBC 5.7 11/18/2017   HGB 12.9 (L) 11/18/2017   HCT 37.8 (L) 11/18/2017   MCV 92.1 11/18/2017   PLT 324 11/18/2017   NEUTROABS 3.5 11/18/2017    CMP  Lab Results  Component Value Date   NA 139 11/18/2017   K 4.1 11/18/2017   CL 104 11/18/2017   CO2 27 11/18/2017   GLUCOSE 102 (H) 11/18/2017   BUN 11 11/18/2017   CREATININE 0.77 11/18/2017   CALCIUM 9.1 11/18/2017   PROT 6.3 (L) 11/18/2017   ALBUMIN 3.1 (L) 11/18/2017   AST 34 11/18/2017   ALT 44 11/18/2017   ALKPHOS 250 (H) 11/18/2017   BILITOT 0.4 11/18/2017   GFRNONAA >60 11/18/2017   GFRAA >60 11/18/2017    Lab Results  Component Value Date   CEA1 4.38  11/04/2017     Medications: I have reviewed the patient's current medications.   Assessment/Plan: Assessment/Plan: 1. Metastatic adenocarcinoma ? 10/02/2017 biopsy of an appendiceal orifice "polyp"revealed invasive adenocarcinoma ? CT abdomen/pelvis 09/26/2017 with evidence of carcinomatosis-omental caking and ascites ? Colonoscopy 10/02/2017 with multiple polyps-tubular adenomas ? Paracentesis 09/24/2017, 09/28/2017-no malignant cells identified ? Paracentesis 11/01/2017- malignant cells consistent with carcinoma ? Biopsy of left peritoneal mass 11/01/2017-poorly differentiated carcinoma ? CT chest 11/01/2017-?  Small pleural nodules versus atelectasis, no other evidence of metastatic disease in the chest ? Cycle 1 FOLFOX 11/04/2017 ? Cycle 2 FOLFOX 11/18/2017  2.Abdominal pain and bloating secondary to #1-improved  3.   Port-A-Cath placement 11/01/2017  4.  Nausea following cycle 1 FOLFOX-Emend and Decadron prophylaxis added with cycle 2   Disposition: Mr. Hagin has completed 1 cycle of FOLFOX.  His clinical status has improved significantly.  He had delayed nausea following cycle 1 FOLFOX.  Emend and Decadron prophylaxis will be added with cycle 2. There was inadequate tissue for molecular testing on the 11/01/2017 peritoneal biopsy.  We will attempt to obtain molecular testing off of the ascitic fluid cells or submit peripheral blood for guardant testing.  He will see Dr. Clovis Riley on 11/27/2017 to consider the indication for cytoreductive surgery and intraperitoneal chemotherapy.  Mr. Uy will return for an office visit and cycle 3 FOLFOX on 12/02/2017.  25 minutes were spent with the patient today.  The majority of the time was used for counseling and coordination of care.  Betsy Coder, MD  11/18/2017  1:12 PM

## 2017-11-18 NOTE — Patient Instructions (Signed)
Implanted Port Home Guide An implanted port is a type of central line that is placed under the skin. Central lines are used to provide IV access when treatment or nutrition needs to be given through a person's veins. Implanted ports are used for long-term IV access. An implanted port may be placed because:  You need IV medicine that would be irritating to the small veins in your hands or arms.  You need long-term IV medicines, such as antibiotics.  You need IV nutrition for a long period.  You need frequent blood draws for lab tests.  You need dialysis.  Implanted ports are usually placed in the chest area, but they can also be placed in the upper arm, the abdomen, or the leg. An implanted port has two main parts:  Reservoir. The reservoir is round and will appear as a small, raised area under your skin. The reservoir is the part where a needle is inserted to give medicines or draw blood.  Catheter. The catheter is a thin, flexible tube that extends from the reservoir. The catheter is placed into a large vein. Medicine that is inserted into the reservoir goes into the catheter and then into the vein.  How will I care for my incision site? Do not get the incision site wet. Bathe or shower as directed by your health care provider. How is my port accessed? Special steps must be taken to access the port:  Before the port is accessed, a numbing cream can be placed on the skin. This helps numb the skin over the port site.  Your health care provider uses a sterile technique to access the port. ? Your health care provider must put on a mask and sterile gloves. ? The skin over your port is cleaned carefully with an antiseptic and allowed to dry. ? The port is gently pinched between sterile gloves, and a needle is inserted into the port.  Only "non-coring" port needles should be used to access the port. Once the port is accessed, a blood return should be checked. This helps ensure that the port  is in the vein and is not clogged.  If your port needs to remain accessed for a constant infusion, a clear (transparent) bandage will be placed over the needle site. The bandage and needle will need to be changed every week, or as directed by your health care provider.  Keep the bandage covering the needle clean and dry. Do not get it wet. Follow your health care provider's instructions on how to take a shower or bath while the port is accessed.  If your port does not need to stay accessed, no bandage is needed over the port.  What is flushing? Flushing helps keep the port from getting clogged. Follow your health care provider's instructions on how and when to flush the port. Ports are usually flushed with saline solution or a medicine called heparin. The need for flushing will depend on how the port is used.  If the port is used for intermittent medicines or blood draws, the port will need to be flushed: ? After medicines have been given. ? After blood has been drawn. ? As part of routine maintenance.  If a constant infusion is running, the port may not need to be flushed.  How long will my port stay implanted? The port can stay in for as long as your health care provider thinks it is needed. When it is time for the port to come out, surgery will be   done to remove it. The procedure is similar to the one performed when the port was put in. When should I seek immediate medical care? When you have an implanted port, you should seek immediate medical care if:  You notice a bad smell coming from the incision site.  You have swelling, redness, or drainage at the incision site.  You have more swelling or pain at the port site or the surrounding area.  You have a fever that is not controlled with medicine.  This information is not intended to replace advice given to you by your health care provider. Make sure you discuss any questions you have with your health care provider. Document  Released: 05/07/2005 Document Revised: 10/13/2015 Document Reviewed: 01/12/2013 Elsevier Interactive Patient Education  2017 Elsevier Inc.  

## 2017-11-18 NOTE — Telephone Encounter (Signed)
Priinted avs and calender of upcoming appointment. Per 7/1 los

## 2017-11-18 NOTE — Progress Notes (Signed)
Nutrition follow-up completed with patient during infusion for metastatic carcinoma with unknown primary. Weight is stable and documented as 121.5 pounds July 1. Patient reports that he has figured out that he tolerates food better if he eats small amounts very slowly. Complains of abdominal pain if he eats too much, too fast. Appears to be eating a regular diet. Reports he will drink Ensure; he just does not have any at this time  Nutrition diagnosis: Severe malnutrition continues.  Intervention: Educated patient to continue strategies for smaller more frequent meals and snacks with adequate calories and protein to minimize weight loss. Provided 1 complementary case of vanilla Ensure Enlive. Teach back method used.  Monitoring, evaluation, goals: Patient will tolerate increased calories and protein to minimize weight loss and promote weight gain.  Next visit: Monday, July 15 during infusion.  **Disclaimer: This note was dictated with voice recognition software. Similar sounding words can inadvertently be transcribed and this note may contain transcription errors which may not have been corrected upon publication of note.**

## 2017-11-18 NOTE — Patient Instructions (Signed)
[facility] Discharge Instructions for Patients Receiving Chemotherapy  Today you received the following chemotherapy agents: Oxaliplatin, Leucovorin, and Adrucil.  To help prevent nausea and vomiting after your treatment, we encourage you to take your nausea medication as directed.   If you develop nausea and vomiting that is not controlled by your nausea medication, call the clinic.   BELOW ARE SYMPTOMS THAT SHOULD BE REPORTED IMMEDIATELY:  *FEVER GREATER THAN 100.5 F  *CHILLS WITH OR WITHOUT FEVER  NAUSEA AND VOMITING THAT IS NOT CONTROLLED WITH YOUR NAUSEA MEDICATION  *UNUSUAL SHORTNESS OF BREATH  *UNUSUAL BRUISING OR BLEEDING  TENDERNESS IN MOUTH AND THROAT WITH OR WITHOUT PRESENCE OF ULCERS  *URINARY PROBLEMS  *BOWEL PROBLEMS  UNUSUAL RASH Items with * indicate a potential emergency and should be followed up as soon as possible.  Feel free to call the clinic should you have any questions or concerns. The clinic phone number is [phone].  Please show the [place] at check-in to the Emergency Department and triage nurse.

## 2017-11-18 NOTE — Progress Notes (Signed)
[place] Work  Patient is scheduled to meet with [facility] representative at 2:00pm prior to his 3:30 appointment on 11/20/17.  Transportation has been arranged and patient aware.    [name], MSW, LCSW, OSW-C Clinical Social Worker [facility] [phone]

## 2017-11-18 NOTE — Progress Notes (Signed)
[place] Work  [place] representative contacted patient at home to discuss transportation.  Patient did not confirm scheduled transportation through the [facility] transportation program for his appointment today.  Patient stated he did not see the multiple conformation request text messages, but did have transportation to his appointment today.  Patient did request transportation for this next 2 appointments.  CSW scheduled transportation for his appointment on 11/20/17 at 3:30pm and 12/02/17 at 10:30am.  CSW encouraged patient to call if there are any changed is his transportation needs.    [name], MSW, LCSW, OSW-C Clinical Social Worker [facility] [phone]

## 2017-11-19 ENCOUNTER — Telehealth: Payer: Self-pay | Admitting: Oncology

## 2017-11-19 NOTE — Telephone Encounter (Signed)
Per previous messages we are at capacity for 7/3 and can not move or add anything

## 2017-11-20 ENCOUNTER — Inpatient Hospital Stay: Payer: BLUE CROSS/BLUE SHIELD

## 2017-11-20 VITALS — BP 118/72 | HR 86 | Temp 98.2°F | Resp 17

## 2017-11-20 DIAGNOSIS — Z5111 Encounter for antineoplastic chemotherapy: Secondary | ICD-10-CM | POA: Diagnosis not present

## 2017-11-20 DIAGNOSIS — C799 Secondary malignant neoplasm of unspecified site: Secondary | ICD-10-CM

## 2017-11-20 MED ORDER — SODIUM CHLORIDE 0.9% FLUSH
10.0000 mL | INTRAVENOUS | Status: DC | PRN
  Administered 2017-11-20: 10 mL
  Filled 2017-11-20: qty 10

## 2017-11-20 MED ORDER — HEPARIN SOD (PORK) LOCK FLUSH 100 UNIT/ML IV SOLN
500.0000 [IU] | Freq: Once | INTRAVENOUS | Status: AC | PRN
  Administered 2017-11-20: 500 [IU]
  Filled 2017-11-20: qty 5

## 2017-11-25 ENCOUNTER — Encounter: Payer: Self-pay | Admitting: General Practice

## 2017-11-25 NOTE — Progress Notes (Signed)
[facility] Spiritual Care Note  Given difficulty reaching pt by phone, I left a handwritten note of introduction with my card and brochure for Mr Nishikawa to receive at his next infusion (Monday 7/15). I will be back in the office for his following tx and plan to see him in infusion then (Monday 7/30). Will also consult with Abigail Elmore/LCSW and Webb Silversmith Cunningham/LCSW as needed to coordinate support, but please always page if immediate needs arise. Thank you!   [place], [place], [facility] Pager [phone] Voicemail [phone]

## 2017-11-27 DIAGNOSIS — C181 Malignant neoplasm of appendix: Secondary | ICD-10-CM | POA: Insufficient documentation

## 2017-11-29 ENCOUNTER — Telehealth: Payer: Self-pay | Admitting: General Practice

## 2017-11-29 NOTE — Telephone Encounter (Signed)
[place] CSW Progress Note  Patient requests change in pick up location for transport for Monday 7/15 appointment - pick up location changed to [address], [place].  Patient aware and agreeable.  Edwyna Shell, LCSW Clinical Social Worker Phone:  [phone]

## 2017-12-02 ENCOUNTER — Inpatient Hospital Stay: Payer: BLUE CROSS/BLUE SHIELD

## 2017-12-02 ENCOUNTER — Other Ambulatory Visit: Payer: Self-pay | Admitting: Oncology

## 2017-12-02 ENCOUNTER — Inpatient Hospital Stay (HOSPITAL_BASED_OUTPATIENT_CLINIC_OR_DEPARTMENT_OTHER): Payer: BLUE CROSS/BLUE SHIELD | Admitting: Oncology

## 2017-12-02 ENCOUNTER — Encounter: Payer: Self-pay | Admitting: General Practice

## 2017-12-02 ENCOUNTER — Telehealth: Payer: Self-pay | Admitting: Oncology

## 2017-12-02 ENCOUNTER — Inpatient Hospital Stay: Payer: BLUE CROSS/BLUE SHIELD | Admitting: Nutrition

## 2017-12-02 VITALS — BP 111/99 | HR 100 | Temp 98.6°F | Resp 18 | Ht 70.0 in | Wt 122.4 lb

## 2017-12-02 DIAGNOSIS — R112 Nausea with vomiting, unspecified: Secondary | ICD-10-CM

## 2017-12-02 DIAGNOSIS — G893 Neoplasm related pain (acute) (chronic): Secondary | ICD-10-CM | POA: Diagnosis not present

## 2017-12-02 DIAGNOSIS — C786 Secondary malignant neoplasm of retroperitoneum and peritoneum: Secondary | ICD-10-CM

## 2017-12-02 DIAGNOSIS — C799 Secondary malignant neoplasm of unspecified site: Secondary | ICD-10-CM

## 2017-12-02 DIAGNOSIS — R109 Unspecified abdominal pain: Secondary | ICD-10-CM

## 2017-12-02 DIAGNOSIS — C801 Malignant (primary) neoplasm, unspecified: Secondary | ICD-10-CM | POA: Diagnosis not present

## 2017-12-02 DIAGNOSIS — Z5111 Encounter for antineoplastic chemotherapy: Secondary | ICD-10-CM | POA: Diagnosis not present

## 2017-12-02 DIAGNOSIS — Z95828 Presence of other vascular implants and grafts: Secondary | ICD-10-CM | POA: Insufficient documentation

## 2017-12-02 LAB — CMP (CANCER CENTER ONLY)
ALK PHOS: 192 U/L — AB (ref 38–126)
ALT: 42 U/L (ref 0–44)
AST: 29 U/L (ref 15–41)
Albumin: 2.8 g/dL — ABNORMAL LOW (ref 3.5–5.0)
Anion gap: 6 (ref 5–15)
BUN: 9 mg/dL (ref 6–20)
CALCIUM: 8.8 mg/dL — AB (ref 8.9–10.3)
CO2: 28 mmol/L (ref 22–32)
CREATININE: 0.68 mg/dL (ref 0.61–1.24)
Chloride: 106 mmol/L (ref 98–111)
GFR, Estimated: >60 mL/min (ref >60)
Glucose, Bld: 96 mg/dL (ref 70–99)
Potassium: 4.3 mmol/L (ref 3.5–5.1)
SODIUM: 140 mmol/L (ref 135–145)
Total Bilirubin: 0.3 mg/dL (ref 0.3–1.2)
Total Protein: 6.2 g/dL — ABNORMAL LOW (ref 6.5–8.1)

## 2017-12-02 LAB — CBC WITH DIFFERENTIAL (CANCER CENTER ONLY)
BASOS PCT: 1 %
Basophils Absolute: 0.1 10*3/uL (ref 0.0–0.1)
EOS ABS: 0.1 10*3/uL (ref 0.0–0.5)
EOS PCT: 2 %
HCT: 36.1 % — ABNORMAL LOW (ref 38.4–49.9)
Hemoglobin: 12.2 g/dL — ABNORMAL LOW (ref 13.0–17.1)
Lymphocytes Relative: 20 %
Lymphs Abs: 1.1 10*3/uL (ref 0.9–3.3)
MCH: 31.6 pg (ref 27.2–33.4)
MCHC: 33.9 g/dL (ref 32.0–36.0)
MCV: 93.3 fL (ref 79.3–98.0)
MONO ABS: 0.7 10*3/uL (ref 0.1–0.9)
MONOS PCT: 13 %
Neutro Abs: 3.6 10*3/uL (ref 1.5–6.5)
Neutrophils Relative %: 64 %
Platelet Count: 261 10*3/uL (ref 140–400)
RBC: 3.87 MIL/uL — ABNORMAL LOW (ref 4.20–5.82)
RDW: 14.2 % (ref 11.0–14.6)
WBC Count: 5.4 10*3/uL (ref 4.0–10.3)

## 2017-12-02 LAB — CEA (IN HOUSE-CHCC): CEA (CHCC-In House): 4.32 ng/mL (ref 0.00–5.00)

## 2017-12-02 MED ORDER — SODIUM CHLORIDE 0.9 % IV SOLN
2400.0000 mg/m2 | INTRAVENOUS | Status: DC
  Administered 2017-12-02: 4000 mg via INTRAVENOUS
  Filled 2017-12-02: qty 80

## 2017-12-02 MED ORDER — OXYCODONE HCL 5 MG PO TABS: 10.0000 mg | ORAL_TABLET | Freq: Three times a day (TID) | ORAL | 0 refills | Status: DC | PRN

## 2017-12-02 MED ORDER — DEXAMETHASONE 4 MG PO TABS: 8.0000 mg | ORAL_TABLET | Freq: Two times a day (BID) | ORAL | 3 refills | Status: DC

## 2017-12-02 MED ORDER — SODIUM CHLORIDE 0.9 % IV SOLN
Freq: Once | INTRAVENOUS | Status: AC
  Administered 2017-12-02: 12:00:00 via INTRAVENOUS
  Filled 2017-12-02: qty 5

## 2017-12-02 MED ORDER — FLUOROURACIL CHEMO INJECTION 2.5 GM/50ML
400.0000 mg/m2 | Freq: Once | INTRAVENOUS | Status: AC
  Administered 2017-12-02: 650 mg via INTRAVENOUS
  Filled 2017-12-02: qty 13

## 2017-12-02 MED ORDER — DEXTROSE 5 % IV SOLN
Freq: Once | INTRAVENOUS | Status: AC
  Administered 2017-12-02: 12:00:00 via INTRAVENOUS

## 2017-12-02 MED ORDER — OXYCODONE-ACETAMINOPHEN 5-325 MG PO TABS
ORAL_TABLET | ORAL | Status: AC
  Filled 2017-12-02: qty 2

## 2017-12-02 MED ORDER — DEXTROSE 5 % IV SOLN
400.0000 mg/m2 | Freq: Once | INTRAVENOUS | Status: AC
  Administered 2017-12-02: 664 mg via INTRAVENOUS
  Filled 2017-12-02: qty 33.2

## 2017-12-02 MED ORDER — OXYCODONE-ACETAMINOPHEN 5-325 MG PO TABS: 2.0000 | ORAL_TABLET | Freq: Once | ORAL | Status: DC

## 2017-12-02 MED ORDER — PALONOSETRON HCL INJECTION 0.25 MG/5ML
INTRAVENOUS | Status: AC
  Filled 2017-12-02: qty 5

## 2017-12-02 MED ORDER — LORAZEPAM 0.5 MG PO TABS: 0.5000 mg | ORAL_TABLET | Freq: Three times a day (TID) | ORAL | 0 refills | Status: DC

## 2017-12-02 MED ORDER — OXYCODONE-ACETAMINOPHEN 5-325 MG PO TABS
2.0000 | ORAL_TABLET | Freq: Once | ORAL | Status: AC
  Administered 2017-12-02: 2 via ORAL

## 2017-12-02 MED ORDER — PALONOSETRON HCL INJECTION 0.25 MG/5ML
0.2500 mg | Freq: Once | INTRAVENOUS | Status: AC
  Administered 2017-12-02: 0.25 mg via INTRAVENOUS

## 2017-12-02 MED ORDER — OXALIPLATIN CHEMO INJECTION 100 MG/20ML
85.0000 mg/m2 | Freq: Once | INTRAVENOUS | Status: AC
  Administered 2017-12-02: 140 mg via INTRAVENOUS
  Filled 2017-12-02: qty 20

## 2017-12-02 MED ORDER — SODIUM CHLORIDE 0.9% FLUSH
10.0000 mL | Freq: Once | INTRAVENOUS | Status: AC
  Administered 2017-12-02: 10 mL
  Filled 2017-12-02: qty 10

## 2017-12-02 NOTE — Telephone Encounter (Signed)
Added additional appointments for 8/12 and 8/14 per 7/15 los. Left message for patient. Other appointments remain the same and patient to get an updated schedule at 7/17 visit.

## 2017-12-02 NOTE — Progress Notes (Signed)
[place] CSW Progress Note  Referral received from dietitian, patient wants help w transport to upcoming appt on 8/28 at 8 AM at [facility] in [place].  [facility] referred to [facility] by online referral portal, appt requested for this date.  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  Unclear whether he is eligible for Disabled [place] transport [phone]) - patient was referred to this provider and they will call him.  CSW will continue to attempt to get him transport via [place] [facility] as this seems the most likely option at this time.  Edwyna Shell, LCSW Clinical Social Worker Phone:  [phone]

## 2017-12-02 NOTE — Patient Instructions (Signed)
[facility] Discharge Instructions for Patients Receiving Chemotherapy  Today you received the following chemotherapy agents: Oxaliplatin, Leucovorin, and Adrucil.  To help prevent nausea and vomiting after your treatment, we encourage you to take your nausea medication as directed.   If you develop nausea and vomiting that is not controlled by your nausea medication, call the clinic.   BELOW ARE SYMPTOMS THAT SHOULD BE REPORTED IMMEDIATELY:  *FEVER GREATER THAN 100.5 F  *CHILLS WITH OR WITHOUT FEVER  NAUSEA AND VOMITING THAT IS NOT CONTROLLED WITH YOUR NAUSEA MEDICATION  *UNUSUAL SHORTNESS OF BREATH  *UNUSUAL BRUISING OR BLEEDING  TENDERNESS IN MOUTH AND THROAT WITH OR WITHOUT PRESENCE OF ULCERS  *URINARY PROBLEMS  *BOWEL PROBLEMS  UNUSUAL RASH Items with * indicate a potential emergency and should be followed up as soon as possible.  Feel free to call the clinic should you have any questions or concerns. The clinic phone number is [phone].  Please show the [place] at check-in to the Emergency Department and triage nurse.

## 2017-12-02 NOTE — Progress Notes (Signed)
Nutrition follow-up completed with patient during infusion for metastatic carcinoma with unknown primary. Weight relatively stable and documented as 122.4 pounds July 15 increased from 121.5 pounds July 1. Patient has no nutrition impact symptoms that he verbalizes. He is drinking Ensure Enlive when he has some.  Nutrition diagnosis: Severe malnutrition continues.  Intervention: Educated patient to continue strategies for increased calories and protein to minimize weight loss. Reminded patient to take Ensure with him today and took this product to infusion so he would not forget. Teach back method used  Monitoring, evaluation, goals: Patient will tolerate increased calories and protein to minimize weight loss.  Next visit: To be scheduled as needed.  **Disclaimer: This note was dictated with voice recognition software. Similar sounding words can inadvertently be transcribed and this note may contain transcription errors which may not have been corrected upon publication of note.**

## 2017-12-02 NOTE — Addendum Note (Signed)
Addended by: Mathis Fare on: 12/02/2017 03:28 PM   Modules accepted: Orders

## 2017-12-02 NOTE — Progress Notes (Signed)
  [facility] OFFICE PROGRESS NOTE   Diagnosis: Metastatic carcinoma  INTERVAL HISTORY:   Derek Lloyd returns for a scheduled visit.  He completed another cycle of FOLFOX on 11/18/2017.  No nausea or vomiting following chemotherapy.  No neuropathy symptoms.  He continues to have abdominal pain.  He takes 2 oxycodone tablets 3 times per day.  His bowels are moving.  He has burning discomfort in the right upper leg. He saw Dr. Clovis Riley 11/27/2017 he recommends completing further chemotherapy prior to a restaging evaluation and consideration of cytoreductive surgery.  Objective:  Vital signs in last 24 hours:  Blood pressure (!) 111/99, pulse 100, temperature 98.6 F (37 C), temperature source Oral, resp. rate 18, height 5' 10"  (1.778 m), weight 122 lb 6.4 oz (55.5 kg), SpO2 99 %.    HEENT: No thrush, healing ulcer of the left buccal mucosa Resp: Lungs clear bilaterally Cardio: Regular rate and rhythm GI: No hepatosplenomegaly, distended no mass Vascular: No leg edema  Skin: Palms without erythema  Portacath/PICC-without erythema  Lab Results:  Lab Results  Component Value Date   WBC 5.4 12/02/2017   HGB 12.2 (L) 12/02/2017   HCT 36.1 (L) 12/02/2017   MCV 93.3 12/02/2017   PLT 261 12/02/2017   NEUTROABS 3.6 12/02/2017    CMP  Lab Results  Component Value Date   NA 140 12/02/2017   K 4.3 12/02/2017   CL 106 12/02/2017   CO2 28 12/02/2017   GLUCOSE 96 12/02/2017   BUN 9 12/02/2017   CREATININE 0.68 12/02/2017   CALCIUM 8.8 (L) 12/02/2017   PROT 6.2 (L) 12/02/2017   ALBUMIN 2.8 (L) 12/02/2017   AST 29 12/02/2017   ALT 42 12/02/2017   ALKPHOS 192 (H) 12/02/2017   BILITOT 0.3 12/02/2017   GFRNONAA >60 12/02/2017   GFRAA >60 12/02/2017    Lab Results  Component Value Date   CEA1 4.38 11/04/2017     Medications: I have reviewed the patient's current medications.   Assessment/Plan: 1. Metastatic adenocarcinoma ? 10/02/2017 biopsy of an appendiceal  orifice "polyp"revealed invasive adenocarcinoma ? CT abdomen/pelvis 09/26/2017 with evidence of carcinomatosis-omental caking and ascites ? Colonoscopy 10/02/2017 with multiple polyps-tubular adenomas ? Paracentesis 09/24/2017, 09/28/2017-no malignant cells identified ? Paracentesis 11/01/2017- malignant cells consistent with carcinoma ? Biopsy of left peritoneal mass 11/01/2017-poorly differentiated carcinoma ? CT chest 11/01/2017-? Small pleural nodules versus atelectasis, no other evidence of metastatic disease in the chest ? Cycle 1 FOLFOX 11/04/2017 ? Cycle 2 FOLFOX 11/18/2017 ? Cycle 3 FOLFOX 12/02/2017  2.Abdominal pain and bloating secondary to #1-improved  3.Port-A-Cath placement 11/01/2017  4.  Nausea following cycle 1 FOLFOX-Emend and Decadron prophylaxis added with cycle 2   Disposition: Derek Lloyd appears stable.  He tolerated cycle 2 chemotherapy without significant nausea.  He will complete cycle 3 FOLFOX today.  He will return for an office visit and cycle 4 chemotherapy in 2 weeks.  The plan is to complete 6 cycles of chemotherapy prior to a restaging evaluation.  He will then be considered for cytoreductive surgery with Dr. Clovis Riley.  We adjusted the narcotic regimen today.  He will take oxycodone (10 mg tablet) every 8 hours as needed for pain.  15 minutes were spent with the patient today.  The majority of the time was used for counseling and coordination of care.  Betsy Coder, MD  12/02/2017  11:58 AM

## 2017-12-03 ENCOUNTER — Encounter: Payer: Self-pay | Admitting: *Deleted

## 2017-12-03 NOTE — Progress Notes (Signed)
Scheduled ride for tomorrow, 12/03/17 215 appointment.  Maryjean Morn, MSW, LCSW, OSW-C Clinical Social Worker [facility] [phone]

## 2017-12-04 ENCOUNTER — Emergency Department (HOSPITAL_COMMUNITY)
Admission: EM | Admit: 2017-12-04 | Discharge: 2017-12-04 | Disposition: A | Discharge status: Home / Self Care | Payer: BLUE CROSS/BLUE SHIELD | Attending: Emergency Medicine | Admitting: Emergency Medicine

## 2017-12-04 ENCOUNTER — Ambulatory Visit (HOSPITAL_BASED_OUTPATIENT_CLINIC_OR_DEPARTMENT_OTHER): Payer: BLUE CROSS/BLUE SHIELD | Admitting: Medical

## 2017-12-04 ENCOUNTER — Other Ambulatory Visit: Payer: Self-pay

## 2017-12-04 ENCOUNTER — Emergency Department (HOSPITAL_COMMUNITY): Payer: BLUE CROSS/BLUE SHIELD

## 2017-12-04 ENCOUNTER — Encounter (HOSPITAL_COMMUNITY): Payer: Self-pay | Admitting: Emergency Medicine

## 2017-12-04 ENCOUNTER — Ambulatory Visit: Payer: BLUE CROSS/BLUE SHIELD

## 2017-12-04 DIAGNOSIS — Z5111 Encounter for antineoplastic chemotherapy: Secondary | ICD-10-CM | POA: Diagnosis not present

## 2017-12-04 DIAGNOSIS — R6884 Jaw pain: Secondary | ICD-10-CM

## 2017-12-04 DIAGNOSIS — R0602 Shortness of breath: Secondary | ICD-10-CM | POA: Diagnosis not present

## 2017-12-04 DIAGNOSIS — R0789 Other chest pain: Secondary | ICD-10-CM | POA: Insufficient documentation

## 2017-12-04 DIAGNOSIS — M546 Pain in thoracic spine: Secondary | ICD-10-CM | POA: Insufficient documentation

## 2017-12-04 DIAGNOSIS — R55 Syncope and collapse: Secondary | ICD-10-CM | POA: Diagnosis not present

## 2017-12-04 DIAGNOSIS — F17210 Nicotine dependence, cigarettes, uncomplicated: Secondary | ICD-10-CM | POA: Insufficient documentation

## 2017-12-04 DIAGNOSIS — Z85038 Personal history of other malignant neoplasm of large intestine: Secondary | ICD-10-CM | POA: Diagnosis not present

## 2017-12-04 DIAGNOSIS — Z79899 Other long term (current) drug therapy: Secondary | ICD-10-CM | POA: Diagnosis not present

## 2017-12-04 DIAGNOSIS — C801 Malignant (primary) neoplasm, unspecified: Secondary | ICD-10-CM

## 2017-12-04 DIAGNOSIS — R079 Chest pain, unspecified: Secondary | ICD-10-CM

## 2017-12-04 HISTORY — DX: Malignant (primary) neoplasm, unspecified: C80.1

## 2017-12-04 LAB — I-STAT TROPONIN, ED
TROPONIN I, POC: 0 ng/mL (ref 0.00–0.08)
Troponin i, poc: 0 ng/mL (ref 0.00–0.08)

## 2017-12-04 LAB — CBC
HEMATOCRIT: 38.7 % — AB (ref 39.0–52.0)
HEMOGLOBIN: 12.9 g/dL — AB (ref 13.0–17.0)
MCH: 31.7 pg (ref 26.0–34.0)
MCHC: 33.3 g/dL (ref 30.0–36.0)
MCV: 95.1 fL (ref 78.0–100.0)
Platelets: 351 10*3/uL (ref 150–400)
RBC: 4.07 MIL/uL — AB (ref 4.22–5.81)
RDW: 14.7 % (ref 11.5–15.5)
WBC: 7.2 10*3/uL (ref 4.0–10.5)

## 2017-12-04 LAB — BASIC METABOLIC PANEL
ANION GAP: 12 (ref 5–15)
BUN: 20 mg/dL (ref 6–20)
CHLORIDE: 103 mmol/L (ref 98–111)
CO2: 26 mmol/L (ref 22–32)
Calcium: 9.3 mg/dL (ref 8.9–10.3)
Creatinine, Ser: 0.8 mg/dL (ref 0.61–1.24)
Glucose, Bld: 130 mg/dL — ABNORMAL HIGH (ref 70–99)
POTASSIUM: 3.9 mmol/L (ref 3.5–5.1)
SODIUM: 141 mmol/L (ref 135–145)

## 2017-12-04 MED ORDER — OXYCODONE-ACETAMINOPHEN 5-325 MG PO TABS
2.0000 | ORAL_TABLET | Freq: Once | ORAL | Status: AC
  Administered 2017-12-04: 2 via ORAL
  Filled 2017-12-04: qty 2

## 2017-12-04 MED ORDER — SODIUM CHLORIDE 0.9 % IV BOLUS
1000.0000 mL | Freq: Once | INTRAVENOUS | Status: AC
  Administered 2017-12-04: 1000 mL via INTRAVENOUS

## 2017-12-04 MED ORDER — HEPARIN SOD (PORK) LOCK FLUSH 100 UNIT/ML IV SOLN
500.0000 [IU] | Freq: Once | INTRAVENOUS | Status: AC
  Administered 2017-12-04: 500 [IU]
  Filled 2017-12-04: qty 5

## 2017-12-04 MED ORDER — IOPAMIDOL (ISOVUE-370) INJECTION 76%
100.0000 mL | Freq: Once | INTRAVENOUS | Status: AC | PRN
  Administered 2017-12-04: 100 mL via INTRAVENOUS

## 2017-12-04 MED ORDER — IOPAMIDOL (ISOVUE-370) INJECTION 76%
INTRAVENOUS | Status: AC
  Filled 2017-12-04: qty 100

## 2017-12-04 NOTE — ED Notes (Signed)
Bed: RESA Expected date:  Expected time:  Means of arrival:  Comments: [facility]

## 2017-12-04 NOTE — ED Notes (Signed)
Pt states he wants blood drawn from IV that he doesn't want stuck again

## 2017-12-04 NOTE — Discharge Instructions (Signed)
Please try to stay hydrated as best you can and follow up with your doctor Return if you are worsening

## 2017-12-04 NOTE — ED Triage Notes (Signed)
Patient was arriving to [facility] to have his pump sfu disconnected today. Pt reports nearly passing out on the way in. Upon arrival he was having chest pain, SOB, left jaw pain, as well as pressure in his back and chest. Pt has stage IV colon CA.

## 2017-12-04 NOTE — ED Provider Notes (Signed)
[facility] DEPT Provider Note   CSN: [id number] Arrival date & time: 12/04/17  1433   History   Chief Complaint Chief Complaint  Patient presents with  . Chest Pain  . Back Pain  . Shortness of Breath    HPI Derek Lloyd is a 47 y.o. male who presents with chest pain, back pain, SOB. PMH significant for IV metastatic colon cancer. Dr. Learta Codding is his oncologist. He states he went to the cancer center today to get his port "box" disconnected after his last infusion on Monday and had an acute onset of chest pain radiating to the jaw, SOB, bilateral mid back pain (L>R), and lightheadedness and feeling like he's going to pass out. He's never had these symptoms before. He is currently undergoing chemotherapy. He denies fever, chills, current chest pain, SOB, cough, leg swelling. He has ongoing abdominal pain as well.  HPI  Past Medical History:  Diagnosis Date  . Abdominal pain 09/2017  . Anxiety attack   . Cancer ([place])   . Headache    "frequently" (08/14/2017)  . Heart murmur    "when I was a kid" (08/14/2017)  . Small bowel obstruction ([place]) 08/13/2017    Patient Active Problem List   Diagnosis Date Noted  . Port-A-Cath in place 12/02/2017  . Metastatic carcinoma ([place]) 10/24/2017  . Goals of care, counseling/discussion 10/24/2017  . Abdominal pain 09/24/2017  . Ascites 09/24/2017  . SBO (small bowel obstruction) ([place]) 08/13/2017  . Right lower quadrant abdominal abscess ([place]) 01/26/2017    Past Surgical History:  Procedure Laterality Date  . BIOPSY  10/02/2017   Procedure: BIOPSY;  Surgeon: [name], MD;  Location: [facility] ENDOSCOPY;  Service: Gastroenterology;;  . COLONOSCOPY WITH PROPOFOL N/A 10/02/2017   Procedure: COLONOSCOPY WITH PROPOFOL;  Surgeon: [name], MD;  Location: [place];  Service: Gastroenterology;  Laterality: N/A;  . FINGER SURGERY Left    "thumb"  . IR IMAGING GUIDED PORT INSERTION  11/01/2017  . IR PARACENTESIS   09/24/2017  . IR US GUIDE VASC ACCESS RIGHT  11/01/2017  . POLYPECTOMY N/A 10/02/2017   Procedure: POLYPECTOMY;  Surgeon: [name], MD;  Location: [place];  Service: Gastroenterology;  Laterality: N/A;  . SUBMUCOSAL INJECTION  10/02/2017   Procedure: SUBMUCOSAL INJECTION;  Surgeon: [name], MD;  Location: [facility] ENDOSCOPY;  Service: Gastroenterology;;  . TYMPANOSTOMY TUBE PLACEMENT Bilateral         Home Medications    Prior to Admission medications   Medication Sig Start Date End Date Taking? Authorizing Provider  dexamethasone (DECADRON) 4 MG tablet Take 2 tablets (8 mg total) by mouth 2 (two) times daily with a meal. Start the day after chemo. 12/02/17  Yes Ladell Pier, MD  oxyCODONE (OXY IR/ROXICODONE) 5 MG immediate release tablet Take 2 tablets (10 mg total) by mouth 3 (three) times daily as needed for severe pain. 12/02/17  Yes Ladell Pier, MD  ALPRAZolam Duanne Moron) 0.25 MG tablet Take 1 tablet (0.25 mg total) by mouth every 8 (eight) hours as needed for anxiety. Patient not taking: Reported on 12/04/2017 11/04/17   Ladell Pier, MD  lidocaine-prilocaine (EMLA) cream Apply to port 1 hour prior to port use. Do not rub in. Cover with plastic. 11/04/17   Ladell Pier, MD  LORazepam (ATIVAN) 0.5 MG tablet Take 1 tablet (0.5 mg total) by mouth every 8 (eight) hours. 12/02/17   Ladell Pier, MD  prochlorperazine (COMPAZINE) 10 MG tablet Take 1 tablet (10  mg total) by mouth every 6 (six) hours as needed for nausea or vomiting. 10/24/17   Owens Shark, NP    Family History Family History  Problem Relation Age of Onset  . Prostate cancer Paternal Grandfather   . Crohn's disease Neg Hx     Social History Social History   Tobacco Use  . Smoking status: Current Some Day Smoker    Packs/day: 1.00    Types: Cigarettes    Last attempt to quit: 09/28/2016    Years since quitting: 1.1  . Smokeless tobacco: Never Used  . Tobacco comment: 08/14/2017 "I quit; can't tell you  when I quit or when I started"  Substance Use Topics  . Alcohol use: Yes    Comment: 08/14/2017 "I drink when I feel like it; nothing recently"  . Drug use: No     Allergies   Asa [aspirin] and Piperacillin-tazobactam in dex   Review of Systems Review of Systems  Constitutional: Negative for fever.  Respiratory: Positive for shortness of breath. Negative for cough.   Cardiovascular: Positive for chest pain.  Gastrointestinal: Positive for abdominal pain.  Musculoskeletal: Positive for back pain.  Neurological: Positive for light-headedness. Negative for syncope.  All other systems reviewed and are negative.    Physical Exam Updated Vital Signs BP 120/81 (BP Location: Right Arm)   Pulse (!) 103   Temp 98.4 F (36.9 C) (Oral)   Resp 16   Ht 5' 10"  (1.778 m)   Wt 57.6 kg (127 lb)   SpO2 100%   BMI 18.22 kg/m   Physical Exam  Constitutional: He is oriented to person, place, and time. He appears well-developed and well-nourished. No distress.  Thin. Blunt and irritable affect. NAD  HENT:  Head: Normocephalic and atraumatic.  Poor dentition  Eyes: Pupils are equal, round, and reactive to light. Conjunctivae are normal. Right eye exhibits no discharge. Left eye exhibits no discharge. No scleral icterus.  Neck: Normal range of motion.  Cardiovascular: Regular rhythm. Tachycardia present.  Pulmonary/Chest: Effort normal and breath sounds normal. No respiratory distress.  Port-a-cath in R upper chest wall  Abdominal: He exhibits no distension.  Refuses abdominal exam  Musculoskeletal:  Tenderness over posterior lower ribs, especially on the L side  Neurological: He is alert and oriented to person, place, and time.  Skin: Skin is warm and dry.  Psychiatric: He has a normal mood and affect. He is agitated.  Irritable  Nursing note and vitals reviewed.    ED Treatments / Results  Labs (all labs ordered are listed, but only abnormal results are displayed) Labs Reviewed   BASIC METABOLIC PANEL - Abnormal; Notable for the following components:      Result Value   Glucose, Bld 130 (*)    All other components within normal limits  CBC - Abnormal; Notable for the following components:   RBC 4.07 (*)    Hemoglobin 12.9 (*)    HCT 38.7 (*)    All other components within normal limits  I-STAT TROPONIN, ED    EKG None  Radiology Dg Chest 2 View  Result Date: 12/04/2017 CLINICAL DATA:  Chest pain, shortness of breath, LEFT jaw pain, near syncopal episode, chest and back pressure, stage IV colon cancer EXAM: CHEST - 2 VIEW COMPARISON:  09/23/2017 FINDINGS: RIGHT jugular Port-A-Cath with tip projecting over SVC. Normal heart size, mediastinal contours, and pulmonary vascularity. Lungs clear. No pulmonary infiltrate, pleural effusion or pneumothorax. Probable BILATERAL nipple shadows, with no pulmonary nodule seen  on interval CT of 11/01/2017 Bones unremarkable. IMPRESSION: No acute abnormalities. Electronically Signed   By: Lavonia Dana M.D.   On: 12/04/2017 15:33    Procedures Procedures (including critical care time)  Medications Ordered in ED Medications  sodium chloride 0.9 % bolus 1,000 mL (1,000 mLs Intravenous New Bag/Given 12/04/17 1542)  iopamidol (ISOVUE-370) 76 % injection 100 mL (has no administration in time range)  iopamidol (ISOVUE-370) 76 % injection (has no administration in time range)  oxyCODONE-acetaminophen (PERCOCET/ROXICET) 5-325 MG per tablet 2 tablet (2 tablets Oral Given 12/04/17 1639)     Initial Impression / Assessment and Plan / ED Course  I have reviewed the triage vital signs and the nursing notes.  Pertinent labs & imaging results that were available during my care of the patient were reviewed by me and considered in my medical decision making (see chart for details).  47 year old male presents with acute onset of lightheadedness, chest pain, SOB, back pain while at the cancer center today. He is mildly tachycardic but  otherwise vitals are normal. He is very irritable on exam which somewhat limits me. EKG is sinus tachycardia with ST elevation in only V3. He denies any chest pain at this time and his main complaint is feeling dehydrated and back pain. He has no appreciable leg swelling. CBC is remarkable for mild anemia. BMP is remarkable for mild hyperglycemia. Troponin is normal. I feel he is high risk for PE therefore CTA of chest was ordered.  4:06 PM Informed by nursing the patient does not want his port accessed again. He states that he also doesn't want an IV to get the CT scan because he doesn't want to get stuck again and he hates needles. I gave him options such as D-dimer, IV, or ultimately discharge if he doesn't want any further work up. He is overall angry at his situation and understands a PE could possibly be fatal. He states he doesn't know what the point is in diagnosing a blood clot if he is going to die anyways. He ultimately agreed to insertion of an IV for a CT.  CT is negative for PE or anything else acute. Delta trop is normal. Results discussed with the patient who will follow up with his doctor.    Final Clinical Impressions(s) / ED Diagnoses   Final diagnoses:  Chest pain, unspecified type  Acute bilateral thoracic back pain  Near syncope    ED Discharge Orders    None       Iris Pert 12/04/17 Layla Maw, MD 12/04/17 2217

## 2017-12-05 ENCOUNTER — Telehealth: Payer: Self-pay | Admitting: General Practice

## 2017-12-05 ENCOUNTER — Encounter: Payer: Self-pay | Admitting: General Practice

## 2017-12-05 NOTE — Telephone Encounter (Signed)
[facility] Progress Note  Call to patient to discuss options for transport to August 28 appt at [facility].  Center does not have any transport options to offer their patients.  Patient is not eligible for VA services per [facility] call center, would not schedule him for [facility] PCP appt which is first step in accessing their transport and psychosocial help.  CSW left VM for [facility] [place] - they will be calling patient to determine if he is eligible for their services.  Patient has been referred to [facility], agency is searching for volunteer driver to transport and they have contacted patient to explain process.  All of the above information left for patient in detailed VM.  Edwyna Shell, LCSW Clinical Social Worker Phone:  [phone]

## 2017-12-06 ENCOUNTER — Encounter: Payer: Self-pay | Admitting: General Practice

## 2017-12-06 NOTE — Progress Notes (Signed)
[facility] Progress Note   [name] [id number] 1970/10/21 47 y.o.  Derek Lloyd is managed by Dr. Dominica Severin B. Sherrill  Actively treated with chemotherapy/immunotherapy: yes  Current Therapy: FOLFOX  Last Treated: 12/02/2017 (cycle 3, day 1)  Assessment: Plan:    Metastatic carcinoma ([place])  Other chest pain   Metastatic colon cancer: The patient is status post cycle 3, day 1 of FOLFOX which was dosed on 12/02/2017.  He is scheduled to be seen in follow-up on 12/17/2017.  Chest pain: Based on the patient's report of 10/10 chest pain a CODE BLUE was called however rapid response only presented.  The patient was taken to the emergency room for evaluation and management.  Please see After Visit Summary for patient specific instructions.  Future Appointments  Date Time Provider [place]  12/17/2017  9:30 AM [facility] LAB 1 [facility] None  12/17/2017  9:45 AM [facility] [place] FLUSH [facility] None  12/17/2017 10:15 AM [place] Shark, NP [facility] None  12/17/2017 11:15 AM [facility] INFUSION [facility] None  12/19/2017  3:15 PM [facility] [place] FLUSH [facility] None  12/30/2017  8:45 AM [facility] LAB ONLY [facility] None  12/30/2017  9:00 AM [facility] [place] FLUSH [facility] None  12/30/2017  9:45 AM Owens Shark, NP [facility] None  12/30/2017 11:00 AM [facility] INFUSION [facility] None  01/01/2018  2:15 PM [facility] [place] FLUSH [facility] None    No orders of the defined types were placed in this encounter.      Subjective:   Patient ID:  Derek Lloyd is a 47 y.o. (DOB May 17, 1971) male.  Chief Complaint: No chief complaint on file.   HPI Mohab Ashby is a 47 year old male with a history of a metastatic colon cancer who is treated by Dr. Dominica Severin B. Sherrill and is status post cycle 3, day 1 of FOLFOX.  He presented to the office today for his pump disconnect.  When he entered the waiting area he reported that he was having 10/10 chest pain with left  jaw pain.  He reported that he had almost passed out as he was coming into the building.  He denies any history of heart disease.  Medications: I have reviewed the patient's current medications.  Allergies:  Allergies  Allergen Reactions  . Piperacillin-Tazobactam In Dex Rash    Noted on May 2019 admission.  Tolerates Cephalosporins.    Past Medical History:  Diagnosis Date  . Abdominal pain 09/2017  . Anxiety attack   . Cancer ([place])   . Headache    "frequently" (08/14/2017)  . Heart murmur    "when I was a kid" (08/14/2017)  . Small bowel obstruction ([place]) 08/13/2017    Past Surgical History:  Procedure Laterality Date  . BIOPSY  10/02/2017   Procedure: BIOPSY;  Surgeon: [name], MD;  Location: [facility] ENDOSCOPY;  Service: Gastroenterology;;  . COLONOSCOPY WITH PROPOFOL N/A 10/02/2017   Procedure: COLONOSCOPY WITH PROPOFOL;  Surgeon: [name], MD;  Location: [place];  Service: Gastroenterology;  Laterality: N/A;  . FINGER SURGERY Left    "thumb"  . IR IMAGING GUIDED PORT INSERTION  11/01/2017  . IR PARACENTESIS  09/24/2017  . IR US GUIDE VASC ACCESS RIGHT  11/01/2017  . POLYPECTOMY N/A 10/02/2017   Procedure: POLYPECTOMY;  Surgeon: [name], MD;  Location: [place];  Service: Gastroenterology;  Laterality: N/A;  . SUBMUCOSAL INJECTION  10/02/2017   Procedure: SUBMUCOSAL INJECTION;  Surgeon: [name], MD;  Location: [place];  Service: Gastroenterology;;  . [place] TUBE  PLACEMENT Bilateral     Family History  Problem Relation Age of Onset  . Prostate cancer Paternal Grandfather   . Crohn's disease Neg Hx     Social History   Socioeconomic History  . Marital status: Single    Spouse name: Not on file  . Number of children: Not on file  . Years of education: Not on file  . Highest education level: Not on file  Occupational History  . Not on file  Social Needs  . Financial resource strain: Not on file  . Food insecurity:    Worry: Not on file      Inability: Not on file  . Transportation needs:    Medical: Not on file    Non-medical: Not on file  Tobacco Use  . Smoking status: Current Some Day Smoker    Packs/day: 1.00    Types: Cigarettes    Last attempt to quit: 09/28/2016    Years since quitting: 1.1  . Smokeless tobacco: Never Used  . Tobacco comment: 08/14/2017 "I quit; can't tell you when I quit or when I started"  Substance and Sexual Activity  . Alcohol use: Yes    Comment: 08/14/2017 "I drink when I feel like it; nothing recently"  . Drug use: No  . Sexual activity: Not on file  Lifestyle  . Physical activity:    Days per week: Not on file    Minutes per session: Not on file  . Stress: Not on file  Relationships  . Social connections:    Talks on phone: Not on file    Gets together: Not on file    Attends religious service: Not on file    Active member of club or organization: Not on file    Attends meetings of clubs or organizations: Not on file    Relationship status: Not on file  . Intimate partner violence:    Fear of current or ex partner: Not on file    Emotionally abused: Not on file    Physically abused: Not on file    Forced sexual activity: Not on file  Other Topics Concern  . Not on file  Social History Narrative  . Not on file    Past Medical History, Surgical history, Social history, and Family history were reviewed and updated as appropriate.   Please see review of systems for further details on the patient's review from today.   Review of Systems:  Review of Systems  Constitutional: Negative for appetite change, chills, diaphoresis and fever.  HENT: Negative for dental problem, mouth sores and trouble swallowing.        Left jaw pain  Respiratory: Negative for cough, chest tightness and shortness of breath.   Cardiovascular: Positive for chest pain. Negative for palpitations.  Gastrointestinal: Negative for constipation, diarrhea, nausea and vomiting.  Neurological: Positive for  weakness. Negative for dizziness, syncope and headaches.    Objective:   Physical Exam:  There were no vitals taken for this visit. ECOG: 0  Physical Exam  Constitutional: No distress.  HENT:  Head: Normocephalic and atraumatic.  Cardiovascular: Regular rhythm, S1 normal, S2 normal and normal heart sounds. Tachycardia present. Exam reveals no friction rub.  No murmur heard. Pulmonary/Chest: Effort normal and breath sounds normal. No respiratory distress. He has no wheezes. He has no rales.  Neurological: He is alert.  Skin: Skin is warm and dry. No rash noted. He is not diaphoretic. No erythema.    Lab Review:  Component Value Date/Time   NA 141 12/04/2017 1449   K 3.9 12/04/2017 1449   CL 103 12/04/2017 1449   CO2 26 12/04/2017 1449   GLUCOSE 130 (H) 12/04/2017 1449   BUN 20 12/04/2017 1449   CREATININE 0.80 12/04/2017 1449   CREATININE 0.68 12/02/2017 1029   CALCIUM 9.3 12/04/2017 1449   PROT 6.2 (L) 12/02/2017 1029   ALBUMIN 2.8 (L) 12/02/2017 1029   AST 29 12/02/2017 1029   ALT 42 12/02/2017 1029   ALKPHOS 192 (H) 12/02/2017 1029   BILITOT 0.3 12/02/2017 1029   GFRNONAA >60 12/04/2017 1449   GFRNONAA >60 12/02/2017 1029   GFRAA >60 12/04/2017 1449   GFRAA >60 12/02/2017 1029       Component Value Date/Time   WBC 7.2 12/04/2017 1449   RBC 4.07 (L) 12/04/2017 1449   HGB 12.9 (L) 12/04/2017 1449   HGB 12.2 (L) 12/02/2017 1029   HCT 38.7 (L) 12/04/2017 1449   PLT 351 12/04/2017 1449   PLT 261 12/02/2017 1029   MCV 95.1 12/04/2017 1449   MCH 31.7 12/04/2017 1449   MCHC 33.3 12/04/2017 1449   RDW 14.7 12/04/2017 1449   LYMPHSABS 1.1 12/02/2017 1029   MONOABS 0.7 12/02/2017 1029   EOSABS 0.1 12/02/2017 1029   BASOSABS 0.1 12/02/2017 1029   -------------------------------  Imaging from last 24 hours (if applicable):  Radiology interpretation: Dg Chest 2 View  Result Date: 12/04/2017 CLINICAL DATA:  Chest pain, shortness of breath, LEFT jaw pain,  near syncopal episode, chest and back pressure, stage IV colon cancer EXAM: CHEST - 2 VIEW COMPARISON:  09/23/2017 FINDINGS: RIGHT jugular Port-A-Cath with tip projecting over SVC. Normal heart size, mediastinal contours, and pulmonary vascularity. Lungs clear. No pulmonary infiltrate, pleural effusion or pneumothorax. Probable BILATERAL nipple shadows, with no pulmonary nodule seen on interval CT of 11/01/2017 Bones unremarkable. IMPRESSION: No acute abnormalities. Electronically Signed   By: Lavonia Dana M.D.   On: 12/04/2017 15:33   Ct Angio Chest Pe W/cm &/or Wo Cm  Result Date: 12/04/2017 CLINICAL DATA:  Near syncope, chest pain, shortness of breath, left jaw pain, chest and back pressure. Stage IV colon carcinoma. EXAM: CT ANGIOGRAPHY CHEST WITH CONTRAST TECHNIQUE: Multidetector CT imaging of the chest was performed using the standard protocol during bolus administration of intravenous contrast. Multiplanar CT image reconstructions and MIPs were obtained to evaluate the vascular anatomy. CONTRAST:  165m ISOVUE-370 IOPAMIDOL (ISOVUE-370) INJECTION 76% COMPARISON:  11/01/2017 and previous FINDINGS: Cardiovascular: Right IJ power-injectable port catheter to the SVC. Heart size normal. No pericardial effusion. Satisfactory opacification of pulmonary arteries noted, and there is no evidence of pulmonary emboli. Adequate contrast opacification of the thoracic aorta with no evidence of dissection, aneurysm, or stenosis. There is classic 3-vessel brachiocephalic arch anatomy without proximal stenosis. No significant atheromatous irregularity. Mediastinum/Nodes: No hilar or mediastinal adenopathy. Lungs/Pleura: No pneumothorax. No pleural effusion. Stable thin-walled bulla in the right lower lobe. Lungs otherwise clear. Upper Abdomen: Ascites in the upper abdomen as before. No acute findings. Musculoskeletal: No chest wall abnormality. No acute or significant osseous findings. Review of the MIP images confirms the  above findings. IMPRESSION: 1.  Negative for PE or thoracic aortic dissection. 2. Abdominal ascites as before. Electronically Signed   By: DLucrezia EuropeM.D.   On: 12/04/2017 17:23

## 2017-12-06 NOTE — Progress Notes (Signed)
[place] has filed for disability benefits on patient's behalf today per [place].  Edwyna Shell, LCSW Clinical Social Worker Phone:  [phone]

## 2017-12-09 ENCOUNTER — Encounter: Payer: Self-pay | Admitting: *Deleted

## 2017-12-09 NOTE — Progress Notes (Signed)
[place] Work  Holiday representative received phone call from patient requesting a referral letter for housing organizations.  CSW and patient discussed housing resources in the community.  CSW will complete referral letter for patient to pick up at his next appointment.  [name], MSW, LCSW, OSW-C Clinical Social Worker [facility] [phone]

## 2017-12-13 ENCOUNTER — Telehealth: Payer: Self-pay | Admitting: General Practice

## 2017-12-13 NOTE — Telephone Encounter (Signed)
[place] CSW Progress Note  Call to patient to discuss letters CSW Dalene Seltzer has prepared for him verifying homelessness.  Pt says he will come Monday 7/29 9 - 10 AM to pick up.  Will come to [place].  Letters left w Print production planner.  [place] notified.  Edwyna Shell, LCSW Clinical Social Worker Phone:  [phone]

## 2017-12-15 ENCOUNTER — Other Ambulatory Visit: Payer: Self-pay | Admitting: Oncology

## 2017-12-17 ENCOUNTER — Inpatient Hospital Stay: Payer: BLUE CROSS/BLUE SHIELD | Admitting: Nutrition

## 2017-12-17 ENCOUNTER — Inpatient Hospital Stay: Payer: BLUE CROSS/BLUE SHIELD

## 2017-12-17 ENCOUNTER — Telehealth: Payer: Self-pay | Admitting: Oncology

## 2017-12-17 ENCOUNTER — Encounter: Payer: Self-pay | Admitting: Oncology

## 2017-12-17 ENCOUNTER — Inpatient Hospital Stay (HOSPITAL_BASED_OUTPATIENT_CLINIC_OR_DEPARTMENT_OTHER): Payer: BLUE CROSS/BLUE SHIELD | Admitting: Nurse Practitioner

## 2017-12-17 ENCOUNTER — Encounter: Payer: Self-pay | Admitting: Nurse Practitioner

## 2017-12-17 VITALS — BP 118/74 | HR 92 | Temp 98.5°F | Resp 18 | Ht 70.0 in | Wt 119.9 lb

## 2017-12-17 DIAGNOSIS — C799 Secondary malignant neoplasm of unspecified site: Secondary | ICD-10-CM

## 2017-12-17 DIAGNOSIS — G893 Neoplasm related pain (acute) (chronic): Secondary | ICD-10-CM

## 2017-12-17 DIAGNOSIS — R6884 Jaw pain: Secondary | ICD-10-CM

## 2017-12-17 DIAGNOSIS — R109 Unspecified abdominal pain: Secondary | ICD-10-CM | POA: Diagnosis not present

## 2017-12-17 DIAGNOSIS — Z95828 Presence of other vascular implants and grafts: Secondary | ICD-10-CM

## 2017-12-17 DIAGNOSIS — C801 Malignant (primary) neoplasm, unspecified: Secondary | ICD-10-CM | POA: Diagnosis not present

## 2017-12-17 DIAGNOSIS — R180 Malignant ascites: Secondary | ICD-10-CM

## 2017-12-17 DIAGNOSIS — C786 Secondary malignant neoplasm of retroperitoneum and peritoneum: Secondary | ICD-10-CM

## 2017-12-17 DIAGNOSIS — Z5111 Encounter for antineoplastic chemotherapy: Secondary | ICD-10-CM | POA: Diagnosis not present

## 2017-12-17 LAB — CMP (CANCER CENTER ONLY)
ALT: 108 U/L — AB (ref 0–44)
AST: 47 U/L — ABNORMAL HIGH (ref 15–41)
Albumin: 3.8 g/dL (ref 3.5–5.0)
Alkaline Phosphatase: 154 U/L — ABNORMAL HIGH (ref 38–126)
Anion gap: 10 (ref 5–15)
BUN: 17 mg/dL (ref 6–20)
CHLORIDE: 105 mmol/L (ref 98–111)
CO2: 24 mmol/L (ref 22–32)
Calcium: 9.6 mg/dL (ref 8.9–10.3)
Creatinine: 0.76 mg/dL (ref 0.61–1.24)
GFR, Estimated: >60 mL/min (ref >60)
Glucose, Bld: 108 mg/dL — ABNORMAL HIGH (ref 70–99)
POTASSIUM: 4.2 mmol/L (ref 3.5–5.1)
SODIUM: 139 mmol/L (ref 135–145)
Total Bilirubin: 0.5 mg/dL (ref 0.3–1.2)
Total Protein: 7.3 g/dL (ref 6.5–8.1)

## 2017-12-17 LAB — CBC WITH DIFFERENTIAL (CANCER CENTER ONLY)
Basophils Absolute: 0 10*3/uL (ref 0.0–0.1)
Basophils Relative: 0 %
Eosinophils Absolute: 0.1 10*3/uL (ref 0.0–0.5)
Eosinophils Relative: 2 %
HCT: 38.5 % (ref 38.4–49.9)
HEMOGLOBIN: 12.8 g/dL — AB (ref 13.0–17.1)
LYMPHS ABS: 1.2 10*3/uL (ref 0.9–3.3)
LYMPHS PCT: 18 %
MCH: 31.7 pg (ref 27.2–33.4)
MCHC: 33.2 g/dL (ref 32.0–36.0)
MCV: 95.3 fL (ref 79.3–98.0)
Monocytes Absolute: 1.3 10*3/uL — ABNORMAL HIGH (ref 0.1–0.9)
Monocytes Relative: 20 %
NEUTROS PCT: 60 %
Neutro Abs: 4 10*3/uL (ref 1.5–6.5)
Platelet Count: 233 10*3/uL (ref 140–400)
RBC: 4.04 MIL/uL — AB (ref 4.20–5.82)
RDW: 16.2 % — ABNORMAL HIGH (ref 11.0–14.6)
WBC: 6.6 10*3/uL (ref 4.0–10.3)

## 2017-12-17 MED ORDER — LEUCOVORIN CALCIUM INJECTION 350 MG
400.0000 mg/m2 | Freq: Once | INTRAVENOUS | Status: AC
  Administered 2017-12-17: 664 mg via INTRAVENOUS
  Filled 2017-12-17: qty 33.2

## 2017-12-17 MED ORDER — PALONOSETRON HCL INJECTION 0.25 MG/5ML
INTRAVENOUS | Status: AC
  Filled 2017-12-17: qty 5

## 2017-12-17 MED ORDER — OXALIPLATIN CHEMO INJECTION 100 MG/20ML
85.0000 mg/m2 | Freq: Once | INTRAVENOUS | Status: AC
  Administered 2017-12-17: 140 mg via INTRAVENOUS
  Filled 2017-12-17: qty 20

## 2017-12-17 MED ORDER — DEXTROSE 5 % IV SOLN
Freq: Once | INTRAVENOUS | Status: AC
  Administered 2017-12-17: 11:00:00 via INTRAVENOUS
  Filled 2017-12-17: qty 250

## 2017-12-17 MED ORDER — FLUOROURACIL CHEMO INJECTION 2.5 GM/50ML
400.0000 mg/m2 | Freq: Once | INTRAVENOUS | Status: AC
  Administered 2017-12-17: 650 mg via INTRAVENOUS
  Filled 2017-12-17: qty 13

## 2017-12-17 MED ORDER — PALONOSETRON HCL INJECTION 0.25 MG/5ML
0.2500 mg | Freq: Once | INTRAVENOUS | Status: AC
  Administered 2017-12-17: 0.25 mg via INTRAVENOUS

## 2017-12-17 MED ORDER — SODIUM CHLORIDE 0.9 % IV SOLN
Freq: Once | INTRAVENOUS | Status: AC
  Administered 2017-12-17: 11:00:00 via INTRAVENOUS
  Filled 2017-12-17: qty 5

## 2017-12-17 MED ORDER — OXYCODONE HCL 5 MG PO TABS: 10.0000 mg | ORAL_TABLET | Freq: Three times a day (TID) | ORAL | 0 refills | Status: DC | PRN

## 2017-12-17 MED ORDER — SODIUM CHLORIDE 0.9% FLUSH
10.0000 mL | Freq: Once | INTRAVENOUS | Status: AC
  Administered 2017-12-17: 10 mL
  Filled 2017-12-17: qty 10

## 2017-12-17 MED ORDER — SODIUM CHLORIDE 0.9 % IV SOLN
2400.0000 mg/m2 | INTRAVENOUS | Status: DC
  Administered 2017-12-17: 4000 mg via INTRAVENOUS
  Filled 2017-12-17: qty 80

## 2017-12-17 MED ORDER — PROCHLORPERAZINE MALEATE 10 MG PO TABS: 10.0000 mg | ORAL_TABLET | Freq: Four times a day (QID) | ORAL | 2 refills | Status: DC | PRN

## 2017-12-17 NOTE — Patient Instructions (Signed)
[place] Discharge Instructions for Patients Receiving Chemotherapy  Today you received the following chemotherapy agents: Oxaliplatin, leucovorin, 5FU  To help prevent nausea and vomiting after your treatment, we encourage you to take your nausea medication as dircted.   If you develop nausea and vomiting that is not controlled by your nausea medication, call the clinic.   BELOW ARE SYMPTOMS THAT SHOULD BE REPORTED IMMEDIATELY:  *FEVER GREATER THAN 100.5 F  *CHILLS WITH OR WITHOUT FEVER  NAUSEA AND VOMITING THAT IS NOT CONTROLLED WITH YOUR NAUSEA MEDICATION  *UNUSUAL SHORTNESS OF BREATH  *UNUSUAL BRUISING OR BLEEDING  TENDERNESS IN MOUTH AND THROAT WITH OR WITHOUT PRESENCE OF ULCERS  *URINARY PROBLEMS  *BOWEL PROBLEMS  UNUSUAL RASH Items with * indicate a potential emergency and should be followed up as soon as possible.  Feel free to call the clinic should you have any questions or concerns. The clinic phone number is [phone].  Please show the [facility] at check-in to the Emergency Department and triage nurse.

## 2017-12-17 NOTE — Progress Notes (Signed)
Pt is approved for the $400 [place].

## 2017-12-17 NOTE — Progress Notes (Signed)
[facility] OFFICE PROGRESS NOTE   Diagnosis: Metastatic carcinoma  INTERVAL HISTORY:   Mr. Matera returns as scheduled.  He completed cycle 3 FOLFOX 12/02/2017.  He was evaluated in the emergency department on 12/04/2017 for chest pain, shortness of breath, back pain and lightheadedness.  Chest CT was negative for PE or other acute finding.  He was discharged home.  He denies nausea/vomiting and diarrhea following the most recent chemotherapy.  No mouth sores.  He tends to note increased abdominal pain the day of chemotherapy.  Otherwise abdominal pain is overall better.  He continues oxycodone as needed.  He notes abdomen is less distended.  He reports the chest pain, shortness of breath, back pain and lightheadedness on 12/04/2017 resolved about 1 hour after leaving the emergency department.  He wonders if his symptoms were due to dehydration as he was limiting fluid intake due to concern this was causing the abdominal distention.  He also had some jaw pain during this time.  Objective:  Vital signs in last 24 hours:  Blood pressure 118/74, pulse 92, temperature 98.5 F (36.9 C), temperature source Oral, resp. rate 18, height 5' 10"  (1.778 m), weight 119 lb 14.4 oz (54.4 kg), SpO2 99 %.    HEENT: No thrush or ulcers. Resp: Lungs clear bilaterally. Cardio: Regular rate and rhythm. GI: Abdomen is mildly distended, soft.  No hepatomegaly. Vascular: No leg edema. Neuro: Vibratory sense intact over the fingertips for tuning fork exam. Skin: Palms without erythema. Port-A-Cath without erythema.   Lab Results:  Lab Results  Component Value Date   WBC 6.6 12/17/2017   HGB 12.8 (L) 12/17/2017   HCT 38.5 12/17/2017   MCV 95.3 12/17/2017   PLT 233 12/17/2017   NEUTROABS 4.0 12/17/2017    Imaging:  No results found.  Medications: I have reviewed the patient's current medications.  Assessment/Plan: 1. Metastatic adenocarcinoma ? 10/02/2017 biopsy of an  appendiceal orifice "polyp"revealed invasive adenocarcinoma ? CT abdomen/pelvis 09/26/2017 with evidence of carcinomatosis-omental caking and ascites ? Colonoscopy 10/02/2017 with multiple polyps-tubular adenomas ? Paracentesis 09/24/2017, 09/28/2017-no malignant cells identified ? Paracentesis 11/01/2017- malignant cells consistent with carcinoma ? Biopsy of left peritoneal mass 11/01/2017-poorly differentiated carcinoma ? CT chest 11/01/2017-? Small pleural nodules versus atelectasis, no other evidence of metastatic disease in the chest ? Cycle 1 FOLFOX 11/04/2017 ? Cycle 2 FOLFOX 11/18/2017 ? Cycle 3 FOLFOX 12/02/2017 ? Cycle 4 FOLFOX 12/17/2017  2.Abdominal pain and bloating secondary to #1-improved  3.Port-A-Cath placement 11/01/2017  4.Nausea following cycle 1 FOLFOX-Emend and Decadron prophylaxis added with cycle 2    Disposition: Mr. Muilenburg appears stable.  He has now completed 3 cycles of FOLFOX.  His performance status is better.  Plan to proceed with cycle 4 FOLFOX today as scheduled.  The plan is for him to complete 6 cycles of chemotherapy prior to a restaging evaluation.  He will then be considered for cytoreductive surgery.  CBC from today reviewed.  Counts are adequate for treatment.  The etiology of the presenting symptoms to the emergency department on 12/04/2017 is unclear.  Chest CT showed no acute findings and his symptoms resolved over several hours.  He feels the symptoms were partially due to dehydration as he was limiting fluid intake due to concern over abdominal distention.  I reassured him that the abdominal distention is not related to his fluid intake.  We discussed that the abdominal distention is related to malignant ascites.  He will try to increase fluid intake and will receive  additional IV fluids here on the day of pump discontinuation.  He will return for lab, follow-up and cycle 5 FOLFOX in 2 weeks.  He will contact the office in the interim with any  problems.  Plan reviewed with Dr. Benay Spice.  Ned Card ANP/GNP-BC   12/17/2017  10:12 AM

## 2017-12-17 NOTE — Progress Notes (Signed)
Okay to treat with ALT of 108 per Ned Card NP.

## 2017-12-17 NOTE — Telephone Encounter (Signed)
Appointments scheduled AVS/Calendar printed per 7/30 los

## 2017-12-17 NOTE — Progress Notes (Signed)
Nutrition follow-up completed with patient during infusion for metastatic carcinoma with unknown primary. Weight decreased and documented as 119.9 pounds July 30 decreased from 122.4 pounds July 15. Patient reports occasional nausea but only when he goes outside during chemotherapy. He continues to try to drink Ensure Enlive.  Nutrition diagnosis: Severe malnutrition continues.  Intervention: Patient educated to continue strategies for increasing calories and protein to minimize weight loss. Provided a complementary case of Ensure Plus and the [employer] book on Nutrition and Cancer. Teach back method used.  Monitoring, evaluation, goals: Patient will tolerate increased calories and protein to minimize further weight loss.  Next visit: Monday, August 26 during treatment.  **Disclaimer: This note was dictated with voice recognition software. Similar sounding words can inadvertently be transcribed and this note may contain transcription errors which may not have been corrected upon publication of note.**

## 2017-12-18 ENCOUNTER — Encounter: Payer: Self-pay | Admitting: *Deleted

## 2017-12-18 ENCOUNTER — Telehealth: Payer: Self-pay

## 2017-12-18 NOTE — Telephone Encounter (Signed)
Derek Lloyd, [place], called Derek Lloyd and spoke to him about his box of Ensure and he will pick it up tomorrow.  It was taken to the front for Derek Lloyd to hold onto. Derek Lloyd

## 2017-12-18 NOTE — Progress Notes (Signed)
[place] Work  Patient requesting transportation assistance for upcoming appointments.  CSW scheduled transportation through [facility] transportation program for appointments on 12/19/17 and 12/30/17.  [name], MSW, LCSW, OSW-C Clinical Social Worker [facility] [phone]

## 2017-12-19 ENCOUNTER — Inpatient Hospital Stay: Payer: Medicaid Other | Attending: Oncology

## 2017-12-19 ENCOUNTER — Inpatient Hospital Stay: Payer: Medicaid Other

## 2017-12-19 VITALS — BP 117/75 | HR 74 | Temp 97.8°F | Resp 18

## 2017-12-19 DIAGNOSIS — Z5111 Encounter for antineoplastic chemotherapy: Secondary | ICD-10-CM | POA: Diagnosis not present

## 2017-12-19 DIAGNOSIS — C799 Secondary malignant neoplasm of unspecified site: Secondary | ICD-10-CM

## 2017-12-19 DIAGNOSIS — C801 Malignant (primary) neoplasm, unspecified: Secondary | ICD-10-CM | POA: Insufficient documentation

## 2017-12-19 DIAGNOSIS — C786 Secondary malignant neoplasm of retroperitoneum and peritoneum: Secondary | ICD-10-CM | POA: Diagnosis not present

## 2017-12-19 MED ORDER — SODIUM CHLORIDE 0.9% FLUSH
10.0000 mL | INTRAVENOUS | Status: DC | PRN
  Administered 2017-12-19: 10 mL
  Filled 2017-12-19: qty 10

## 2017-12-19 MED ORDER — HEPARIN SOD (PORK) LOCK FLUSH 100 UNIT/ML IV SOLN
500.0000 [IU] | Freq: Once | INTRAVENOUS | Status: AC | PRN
  Administered 2017-12-19: 500 [IU]
  Filled 2017-12-19: qty 5

## 2017-12-29 ENCOUNTER — Other Ambulatory Visit: Payer: Self-pay | Admitting: Oncology

## 2017-12-30 ENCOUNTER — Telehealth: Payer: Self-pay | Admitting: Oncology

## 2017-12-30 ENCOUNTER — Encounter: Payer: Self-pay | Admitting: Nurse Practitioner

## 2017-12-30 ENCOUNTER — Inpatient Hospital Stay: Payer: Medicaid Other

## 2017-12-30 ENCOUNTER — Inpatient Hospital Stay (HOSPITAL_BASED_OUTPATIENT_CLINIC_OR_DEPARTMENT_OTHER): Payer: Medicaid Other | Admitting: Nurse Practitioner

## 2017-12-30 ENCOUNTER — Telehealth: Payer: Self-pay

## 2017-12-30 VITALS — HR 99 | Resp 17

## 2017-12-30 VITALS — BP 102/72 | HR 111 | Temp 97.5°F | Resp 18 | Ht 70.0 in | Wt 126.2 lb

## 2017-12-30 DIAGNOSIS — C801 Malignant (primary) neoplasm, unspecified: Secondary | ICD-10-CM | POA: Diagnosis not present

## 2017-12-30 DIAGNOSIS — C786 Secondary malignant neoplasm of retroperitoneum and peritoneum: Secondary | ICD-10-CM | POA: Diagnosis not present

## 2017-12-30 DIAGNOSIS — C799 Secondary malignant neoplasm of unspecified site: Secondary | ICD-10-CM

## 2017-12-30 DIAGNOSIS — Z5111 Encounter for antineoplastic chemotherapy: Secondary | ICD-10-CM | POA: Diagnosis not present

## 2017-12-30 DIAGNOSIS — R112 Nausea with vomiting, unspecified: Secondary | ICD-10-CM

## 2017-12-30 DIAGNOSIS — Z95828 Presence of other vascular implants and grafts: Secondary | ICD-10-CM

## 2017-12-30 LAB — CBC WITH DIFFERENTIAL (CANCER CENTER ONLY)
BASOS ABS: 0 10*3/uL (ref 0.0–0.1)
Basophils Relative: 0 %
EOS ABS: 0.1 10*3/uL (ref 0.0–0.5)
EOS PCT: 2 %
HCT: 38 % — ABNORMAL LOW (ref 38.4–49.9)
Hemoglobin: 12.7 g/dL — ABNORMAL LOW (ref 13.0–17.1)
Lymphocytes Relative: 30 %
Lymphs Abs: 2.2 10*3/uL (ref 0.9–3.3)
MCH: 32.3 pg (ref 27.2–33.4)
MCHC: 33.4 g/dL (ref 32.0–36.0)
MCV: 96.7 fL (ref 79.3–98.0)
MONO ABS: 0.6 10*3/uL (ref 0.1–0.9)
Monocytes Relative: 9 %
Neutro Abs: 4.3 10*3/uL (ref 1.5–6.5)
Neutrophils Relative %: 59 %
PLATELETS: 253 10*3/uL (ref 140–400)
RBC: 3.93 MIL/uL — ABNORMAL LOW (ref 4.20–5.82)
RDW: 17.4 % — AB (ref 11.0–14.6)
WBC: 7.3 10*3/uL (ref 4.0–10.3)

## 2017-12-30 LAB — CMP (CANCER CENTER ONLY)
ALK PHOS: 114 U/L (ref 38–126)
ALT: 70 U/L — AB (ref 0–44)
AST: 48 U/L — AB (ref 15–41)
Albumin: 3.6 g/dL (ref 3.5–5.0)
Anion gap: 12 (ref 5–15)
BILIRUBIN TOTAL: 0.5 mg/dL (ref 0.3–1.2)
BUN: 9 mg/dL (ref 6–20)
CALCIUM: 9.2 mg/dL (ref 8.9–10.3)
CHLORIDE: 107 mmol/L (ref 98–111)
CO2: 24 mmol/L (ref 22–32)
CREATININE: 0.79 mg/dL (ref 0.61–1.24)
Glucose, Bld: 97 mg/dL (ref 70–99)
Potassium: 4.4 mmol/L (ref 3.5–5.1)
Sodium: 143 mmol/L (ref 135–145)
Total Protein: 6.8 g/dL (ref 6.5–8.1)

## 2017-12-30 LAB — CEA (IN HOUSE-CHCC): CEA (CHCC-IN HOUSE): 6.04 ng/mL — AB (ref 0.00–5.00)

## 2017-12-30 MED ORDER — SODIUM CHLORIDE 0.9 % IV SOLN
2400.0000 mg/m2 | INTRAVENOUS | Status: DC
  Administered 2017-12-30: 4000 mg via INTRAVENOUS
  Filled 2017-12-30: qty 80

## 2017-12-30 MED ORDER — HEPARIN SOD (PORK) LOCK FLUSH 100 UNIT/ML IV SOLN
500.0000 [IU] | Freq: Once | INTRAVENOUS | Status: DC | PRN
  Filled 2017-12-30: qty 5

## 2017-12-30 MED ORDER — LORAZEPAM 0.5 MG PO TABS: 0.5000 mg | ORAL_TABLET | Freq: Three times a day (TID) | ORAL | 0 refills | Status: DC | PRN

## 2017-12-30 MED ORDER — SODIUM CHLORIDE 0.9% FLUSH
10.0000 mL | INTRAVENOUS | Status: DC | PRN
  Filled 2017-12-30: qty 10

## 2017-12-30 MED ORDER — FLUOROURACIL CHEMO INJECTION 2.5 GM/50ML
400.0000 mg/m2 | Freq: Once | INTRAVENOUS | Status: AC
  Administered 2017-12-30: 650 mg via INTRAVENOUS
  Filled 2017-12-30: qty 13

## 2017-12-30 MED ORDER — SODIUM CHLORIDE 0.9% FLUSH
10.0000 mL | Freq: Once | INTRAVENOUS | Status: AC
  Administered 2017-12-30: 10 mL
  Filled 2017-12-30: qty 10

## 2017-12-30 MED ORDER — OXYCODONE HCL 5 MG PO TABS: 10.0000 mg | ORAL_TABLET | Freq: Three times a day (TID) | ORAL | 0 refills | Status: DC | PRN

## 2017-12-30 MED ORDER — PALONOSETRON HCL INJECTION 0.25 MG/5ML
0.2500 mg | Freq: Once | INTRAVENOUS | Status: AC
  Administered 2017-12-30: 0.25 mg via INTRAVENOUS

## 2017-12-30 MED ORDER — SODIUM CHLORIDE 0.9 % IV SOLN
Freq: Once | INTRAVENOUS | Status: AC
  Administered 2017-12-30: 11:00:00 via INTRAVENOUS
  Filled 2017-12-30: qty 5

## 2017-12-30 MED ORDER — LEUCOVORIN CALCIUM INJECTION 350 MG
400.0000 mg/m2 | Freq: Once | INTRAVENOUS | Status: AC
  Administered 2017-12-30: 664 mg via INTRAVENOUS
  Filled 2017-12-30: qty 33.2

## 2017-12-30 MED ORDER — DEXTROSE 5 % IV SOLN
Freq: Once | INTRAVENOUS | Status: AC
  Administered 2017-12-30: 11:00:00 via INTRAVENOUS
  Filled 2017-12-30: qty 250

## 2017-12-30 MED ORDER — OXALIPLATIN CHEMO INJECTION 100 MG/20ML
85.0000 mg/m2 | Freq: Once | INTRAVENOUS | Status: AC
  Administered 2017-12-30: 140 mg via INTRAVENOUS
  Filled 2017-12-30: qty 28

## 2017-12-30 MED ORDER — DEXTROSE 5 % IV SOLN
Freq: Once | INTRAVENOUS | Status: AC
  Administered 2017-12-30: 12:00:00 via INTRAVENOUS
  Filled 2017-12-30: qty 250

## 2017-12-30 MED ORDER — PALONOSETRON HCL INJECTION 0.25 MG/5ML
INTRAVENOUS | Status: AC
  Filled 2017-12-30: qty 5

## 2017-12-30 NOTE — Patient Instructions (Signed)
[facility] Discharge Instructions for Patients Receiving Chemotherapy  Today you received the following chemotherapy agents: Oxaliplatin, Leucovorin, and Adrucil.  To help prevent nausea and vomiting after your treatment, we encourage you to take your nausea medication as directed.   If you develop nausea and vomiting that is not controlled by your nausea medication, call the clinic.   BELOW ARE SYMPTOMS THAT SHOULD BE REPORTED IMMEDIATELY:  *FEVER GREATER THAN 100.5 F  *CHILLS WITH OR WITHOUT FEVER  NAUSEA AND VOMITING THAT IS NOT CONTROLLED WITH YOUR NAUSEA MEDICATION  *UNUSUAL SHORTNESS OF BREATH  *UNUSUAL BRUISING OR BLEEDING  TENDERNESS IN MOUTH AND THROAT WITH OR WITHOUT PRESENCE OF ULCERS  *URINARY PROBLEMS  *BOWEL PROBLEMS  UNUSUAL RASH Items with * indicate a potential emergency and should be followed up as soon as possible.  Feel free to call the clinic should you have any questions or concerns. The clinic phone number is [phone].  Please show the [place] at check-in to the Emergency Department and triage nurse.

## 2017-12-30 NOTE — Progress Notes (Signed)
  [facility] OFFICE PROGRESS NOTE   Diagnosis: Metastatic carcinoma  INTERVAL HISTORY:   Mr. Brammer returns as scheduled.  He completed cycle 4 FOLFOX 12/17/2017.  He denies nausea/vomiting.  No mouth sores.  During week 1 he developed loose stools.  This lasted about 3 days.  Stools were not watery.  Bowel habits have since returned to baseline.  Cold sensitivity lasted about 6 days.  No persistent neuropathy symptoms.  Pain continues to be improved.  He takes oxycodone as needed.  Objective:  Vital signs in last 24 hours:  Blood pressure 102/72, pulse (!) 111, temperature (!) 97.5 F (36.4 C), temperature source Oral, resp. rate 18, height 5' 10"  (1.778 m), weight 126 lb 3.2 oz (57.2 kg), SpO2 98 %.    HEENT: No thrush or ulcers. Resp: Lungs clear bilaterally. Cardio: Regular rate and rhythm. GI: Abdomen soft with mild diffuse tenderness.  Mild abdominal distention.  No hepatomegaly. Vascular: No leg edema. Neuro: Vibratory sense intact over the fingertips per tuning fork exam. Skin: Palms without erythema. Port-A-Cath without erythema.   Lab Results:  Lab Results  Component Value Date   WBC 7.3 12/30/2017   HGB 12.7 (L) 12/30/2017   HCT 38.0 (L) 12/30/2017   MCV 96.7 12/30/2017   PLT 253 12/30/2017   NEUTROABS 4.3 12/30/2017    Imaging:  No results found.  Medications: I have reviewed the patient's current medications.  Assessment/Plan: 1. Metastatic adenocarcinoma ? 10/02/2017 biopsy of an appendiceal orifice "polyp"revealed invasive adenocarcinoma ? CT abdomen/pelvis 09/26/2017 with evidence of carcinomatosis-omental caking and ascites ? Colonoscopy 10/02/2017 with multiple polyps-tubular adenomas ? Paracentesis 09/24/2017, 09/28/2017-no malignant cells identified ? Paracentesis 11/01/2017-malignant cells consistent with carcinoma ? Biopsy of left peritoneal mass 11/01/2017-poorly differentiated carcinoma ? CT chest 11/01/2017-? Small pleural  nodules versus atelectasis, no other evidence of metastatic disease in the chest ? Cycle 1 FOLFOX 11/04/2017 ? Cycle 2 FOLFOX 11/18/2017 ? Cycle 3 FOLFOX 12/02/2017 ? Cycle 4 FOLFOX 12/17/2017 ? Cycle 5 FOLFOX 12/30/2017  2.Abdominal pain and bloating secondary to #1-improved  3.Port-A-Cath placement 11/01/2017  4.Nausea following cycle 1 FOLFOX-Emend and Decadron prophylaxis added with cycle 2  Disposition: Mr. Giampietro appears stable.  He has completed 4 cycles of FOLFOX.  Plan to proceed with cycle 5 today as scheduled.  The plan is for him to complete 6 cycles of chemotherapy prior to a restaging CT evaluation.  He will then be considered for cytoreductive surgery.  We reviewed the CBC from today.  Counts are adequate for treatment.  He will return for lab, follow-up and cycle 6 FOLFOX in 2 weeks.  He will contact the office in the interim with any problems.    Ned Card ANP/GNP-BC   12/30/2017  10:12 AM

## 2017-12-30 NOTE — Telephone Encounter (Signed)
Spoke to patient regarding upcoming aug appt updates per 8/12 sch message.

## 2017-12-30 NOTE — Telephone Encounter (Signed)
Called Derek Lloyd to let him know we are ready for him in infusion.  He was taking a smoke break and will be in shortly. Derek Lloyd

## 2017-12-30 NOTE — Telephone Encounter (Signed)
Called pt and confirmed the ride that is scheduled for 8/14.

## 2018-01-01 ENCOUNTER — Inpatient Hospital Stay: Payer: Medicaid Other

## 2018-01-01 VITALS — BP 97/67 | HR 80 | Temp 98.2°F | Resp 16

## 2018-01-01 DIAGNOSIS — Z5111 Encounter for antineoplastic chemotherapy: Secondary | ICD-10-CM | POA: Diagnosis not present

## 2018-01-01 DIAGNOSIS — C799 Secondary malignant neoplasm of unspecified site: Secondary | ICD-10-CM

## 2018-01-01 MED ORDER — SODIUM CHLORIDE 0.9% FLUSH
10.0000 mL | INTRAVENOUS | Status: DC | PRN
  Administered 2018-01-01: 10 mL
  Filled 2018-01-01: qty 10

## 2018-01-01 MED ORDER — HEPARIN SOD (PORK) LOCK FLUSH 100 UNIT/ML IV SOLN
500.0000 [IU] | Freq: Once | INTRAVENOUS | Status: AC | PRN
  Administered 2018-01-01: 500 [IU]
  Filled 2018-01-01: qty 5

## 2018-01-01 NOTE — Patient Instructions (Signed)
Implanted Port Home Guide An implanted port is a type of central line that is placed under the skin. Central lines are used to provide IV access when treatment or nutrition needs to be given through a person's veins. Implanted ports are used for long-term IV access. An implanted port may be placed because:  You need IV medicine that would be irritating to the small veins in your hands or arms.  You need long-term IV medicines, such as antibiotics.  You need IV nutrition for a long period.  You need frequent blood draws for lab tests.  You need dialysis.  Implanted ports are usually placed in the chest area, but they can also be placed in the upper arm, the abdomen, or the leg. An implanted port has two main parts:  Reservoir. The reservoir is round and will appear as a small, raised area under your skin. The reservoir is the part where a needle is inserted to give medicines or draw blood.  Catheter. The catheter is a thin, flexible tube that extends from the reservoir. The catheter is placed into a large vein. Medicine that is inserted into the reservoir goes into the catheter and then into the vein.  How will I care for my incision site? Do not get the incision site wet. Bathe or shower as directed by your health care provider. How is my port accessed? Special steps must be taken to access the port:  Before the port is accessed, a numbing cream can be placed on the skin. This helps numb the skin over the port site.  Your health care provider uses a sterile technique to access the port. ? Your health care provider must put on a mask and sterile gloves. ? The skin over your port is cleaned carefully with an antiseptic and allowed to dry. ? The port is gently pinched between sterile gloves, and a needle is inserted into the port.  Only "non-coring" port needles should be used to access the port. Once the port is accessed, a blood return should be checked. This helps ensure that the port  is in the vein and is not clogged.  If your port needs to remain accessed for a constant infusion, a clear (transparent) bandage will be placed over the needle site. The bandage and needle will need to be changed every week, or as directed by your health care provider.  Keep the bandage covering the needle clean and dry. Do not get it wet. Follow your health care provider's instructions on how to take a shower or bath while the port is accessed.  If your port does not need to stay accessed, no bandage is needed over the port.  What is flushing? Flushing helps keep the port from getting clogged. Follow your health care provider's instructions on how and when to flush the port. Ports are usually flushed with saline solution or a medicine called heparin. The need for flushing will depend on how the port is used.  If the port is used for intermittent medicines or blood draws, the port will need to be flushed: ? After medicines have been given. ? After blood has been drawn. ? As part of routine maintenance.  If a constant infusion is running, the port may not need to be flushed.  How long will my port stay implanted? The port can stay in for as long as your health care provider thinks it is needed. When it is time for the port to come out, surgery will be   done to remove it. The procedure is similar to the one performed when the port was put in. When should I seek immediate medical care? When you have an implanted port, you should seek immediate medical care if:  You notice a bad smell coming from the incision site.  You have swelling, redness, or drainage at the incision site.  You have more swelling or pain at the port site or the surrounding area.  You have a fever that is not controlled with medicine.  This information is not intended to replace advice given to you by your health care provider. Make sure you discuss any questions you have with your health care provider. Document  Released: 05/07/2005 Document Revised: 10/13/2015 Document Reviewed: 01/12/2013 Elsevier Interactive Patient Education  2017 Elsevier Inc.  

## 2018-01-01 NOTE — Progress Notes (Signed)
Pt states BP low d/t he has not eaten today and he took pain meds prior to coming.  Pt refuses IVFs at this time stating that he is going to go home and eat now.  Pt otherwise asymptomatic.  Dr Benay Spice made aware.

## 2018-01-02 ENCOUNTER — Other Ambulatory Visit: Payer: Self-pay

## 2018-01-02 DIAGNOSIS — C799 Secondary malignant neoplasm of unspecified site: Secondary | ICD-10-CM

## 2018-01-02 MED ORDER — DEXAMETHASONE 4 MG PO TABS: 8.0000 mg | ORAL_TABLET | Freq: Two times a day (BID) | ORAL | 3 refills | Status: DC

## 2018-01-02 MED ORDER — PROCHLORPERAZINE MALEATE 10 MG PO TABS: 10.0000 mg | ORAL_TABLET | Freq: Four times a day (QID) | ORAL | 2 refills | Status: DC | PRN

## 2018-01-13 ENCOUNTER — Inpatient Hospital Stay: Payer: Medicaid Other

## 2018-01-13 ENCOUNTER — Telehealth: Payer: Self-pay | Admitting: Oncology

## 2018-01-13 ENCOUNTER — Inpatient Hospital Stay: Payer: Medicaid Other | Admitting: Oncology

## 2018-01-13 ENCOUNTER — Inpatient Hospital Stay: Payer: Medicaid Other | Admitting: Nutrition

## 2018-01-13 ENCOUNTER — Other Ambulatory Visit: Payer: Medicaid Other

## 2018-01-13 ENCOUNTER — Other Ambulatory Visit: Payer: Self-pay | Admitting: Oncology

## 2018-01-13 ENCOUNTER — Encounter: Payer: Self-pay | Admitting: Nutrition

## 2018-01-13 NOTE — Progress Notes (Signed)
Patient did not show up for nutrition appointment.

## 2018-01-13 NOTE — Progress Notes (Deleted)
  [facility] [facility] OFFICE PROGRESS NOTE   Diagnosis:   INTERVAL HISTORY:   ***  Objective:  Vital signs in last 24 hours:  There were no vitals taken for this visit.    HEENT: *** Lymphatics: *** Resp: *** Cardio: *** GI: *** Vascular: *** Neuro:***  Skin:***   Portacath/PICC-without erythema  Lab Results:  Lab Results  Component Value Date   WBC 7.3 12/30/2017   HGB 12.7 (L) 12/30/2017   HCT 38.0 (L) 12/30/2017   MCV 96.7 12/30/2017   PLT 253 12/30/2017   NEUTROABS 4.3 12/30/2017    CMP  Lab Results  Component Value Date   NA 143 12/30/2017   K 4.4 12/30/2017   CL 107 12/30/2017   CO2 24 12/30/2017   GLUCOSE 97 12/30/2017   BUN 9 12/30/2017   CREATININE 0.79 12/30/2017   CALCIUM 9.2 12/30/2017   PROT 6.8 12/30/2017   ALBUMIN 3.6 12/30/2017   AST 48 (H) 12/30/2017   ALT 70 (H) 12/30/2017   ALKPHOS 114 12/30/2017   BILITOT 0.5 12/30/2017   GFRNONAA >60 12/30/2017   GFRAA >60 12/30/2017    Lab Results  Component Value Date   CEA1 6.04 (H) 12/30/2017    Lab Results  Component Value Date   INR 1.21 09/27/2017    Imaging:  No results found.  Medications: I have reviewed the patient's current medications.   Assessment/Plan: 1. Metastatic adenocarcinoma ? 10/02/2017 biopsy of an appendiceal orifice "polyp"revealed invasive adenocarcinoma ? CT abdomen/pelvis 09/26/2017 with evidence of carcinomatosis-omental caking and ascites ? Colonoscopy 10/02/2017 with multiple polyps-tubular adenomas ? Paracentesis 09/24/2017, 09/28/2017-no malignant cells identified ? Paracentesis 11/01/2017-malignant cells consistent with carcinoma ? Biopsy of left peritoneal mass 11/01/2017-poorly differentiated carcinoma ? CT chest 11/01/2017-? Small pleural nodules versus atelectasis, no other evidence of metastatic disease in the chest ? Cycle 1 FOLFOX 11/04/2017 ? Cycle 2 FOLFOX 11/18/2017 ? Cycle 3 FOLFOX 12/02/2017 ? Cycle 4 FOLFOX 12/17/2017 ? Cycle 5  FOLFOX 12/30/2017  2.Abdominal pain and bloating secondary to #1-improved  3.Port-A-Cath placement 11/01/2017  4.Nausea following cycle 1 FOLFOX-Emend and Decadron prophylaxis added with cycle 2    Disposition: ***  Betsy Coder, MD  01/13/2018  8:12 AM

## 2018-01-13 NOTE — Telephone Encounter (Signed)
Worked with scheduling to get patient scheduled for tomorrow. Scheduled ride for patient to get to treatment - spoke w/ pt and confirmed appts.

## 2018-01-13 NOTE — Progress Notes (Signed)
Patient called to state that he had to cancel his infusion apt today and is hoping to reschedule for tomorrow 8/27. Patient has started his semester at [employer] and endorses being overwhelmed. He states that the more stressed he is the more abdominal pain he is experiencing. Patient has been living with an older male friend who's family member was released from jail and patient did not feel safe staying there. Patient is living with another family member of this friend. Patient is concerned that his disability check will get stolen. Message sent to [facility] LCSW and C. Ouida Sills RN for patient assistance with these issues.

## 2018-01-14 ENCOUNTER — Inpatient Hospital Stay (HOSPITAL_BASED_OUTPATIENT_CLINIC_OR_DEPARTMENT_OTHER): Payer: Medicaid Other | Admitting: Nurse Practitioner

## 2018-01-14 ENCOUNTER — Inpatient Hospital Stay: Payer: Medicaid Other

## 2018-01-14 ENCOUNTER — Encounter: Payer: Self-pay | Admitting: Nurse Practitioner

## 2018-01-14 VITALS — BP 112/72 | HR 81 | Temp 98.4°F | Resp 18 | Ht 70.0 in | Wt 125.8 lb

## 2018-01-14 DIAGNOSIS — C786 Secondary malignant neoplasm of retroperitoneum and peritoneum: Secondary | ICD-10-CM | POA: Diagnosis not present

## 2018-01-14 DIAGNOSIS — C801 Malignant (primary) neoplasm, unspecified: Secondary | ICD-10-CM | POA: Diagnosis not present

## 2018-01-14 DIAGNOSIS — C799 Secondary malignant neoplasm of unspecified site: Secondary | ICD-10-CM

## 2018-01-14 DIAGNOSIS — Z5111 Encounter for antineoplastic chemotherapy: Secondary | ICD-10-CM | POA: Diagnosis not present

## 2018-01-14 DIAGNOSIS — R112 Nausea with vomiting, unspecified: Secondary | ICD-10-CM

## 2018-01-14 LAB — CMP (CANCER CENTER ONLY)
ALT: 47 U/L — AB (ref 0–44)
AST: 41 U/L (ref 15–41)
Albumin: 3.7 g/dL (ref 3.5–5.0)
Alkaline Phosphatase: 114 U/L (ref 38–126)
Anion gap: 9 (ref 5–15)
BILIRUBIN TOTAL: 0.7 mg/dL (ref 0.3–1.2)
BUN: 9 mg/dL (ref 6–20)
CHLORIDE: 106 mmol/L (ref 98–111)
CO2: 25 mmol/L (ref 22–32)
CREATININE: 0.73 mg/dL (ref 0.61–1.24)
Calcium: 9.6 mg/dL (ref 8.9–10.3)
GFR, Est AFR Am: >60 mL/min (ref >60)
Glucose, Bld: 117 mg/dL — ABNORMAL HIGH (ref 70–99)
Potassium: 4.1 mmol/L (ref 3.5–5.1)
Sodium: 140 mmol/L (ref 135–145)
Total Protein: 7.1 g/dL (ref 6.5–8.1)

## 2018-01-14 LAB — CBC WITH DIFFERENTIAL (CANCER CENTER ONLY)
BASOS ABS: 0 10*3/uL (ref 0.0–0.1)
Basophils Relative: 0 %
Eosinophils Absolute: 0.1 10*3/uL (ref 0.0–0.5)
Eosinophils Relative: 1 %
HEMATOCRIT: 39.3 % (ref 38.4–49.9)
HEMOGLOBIN: 13.2 g/dL (ref 13.0–17.1)
LYMPHS PCT: 20 %
Lymphs Abs: 1.5 10*3/uL (ref 0.9–3.3)
MCH: 32.9 pg (ref 27.2–33.4)
MCHC: 33.6 g/dL (ref 32.0–36.0)
MCV: 98 fL (ref 79.3–98.0)
Monocytes Absolute: 1.1 10*3/uL — ABNORMAL HIGH (ref 0.1–0.9)
Monocytes Relative: 15 %
NEUTROS ABS: 4.9 10*3/uL (ref 1.5–6.5)
Neutrophils Relative %: 64 %
PLATELETS: 188 10*3/uL (ref 140–400)
RBC: 4.01 MIL/uL — AB (ref 4.20–5.82)
RDW: 18.9 % — ABNORMAL HIGH (ref 11.0–14.6)
WBC: 7.6 10*3/uL (ref 4.0–10.3)

## 2018-01-14 MED ORDER — LORAZEPAM 0.5 MG PO TABS: 0.5000 mg | ORAL_TABLET | Freq: Three times a day (TID) | ORAL | 0 refills | Status: DC | PRN

## 2018-01-14 MED ORDER — PALONOSETRON HCL INJECTION 0.25 MG/5ML
0.2500 mg | Freq: Once | INTRAVENOUS | Status: AC
  Administered 2018-01-14: 0.25 mg via INTRAVENOUS

## 2018-01-14 MED ORDER — FLUOROURACIL CHEMO INJECTION 2.5 GM/50ML
400.0000 mg/m2 | Freq: Once | INTRAVENOUS | Status: AC
  Administered 2018-01-14: 650 mg via INTRAVENOUS
  Filled 2018-01-14: qty 13

## 2018-01-14 MED ORDER — SODIUM CHLORIDE 0.9 % IV SOLN
2400.0000 mg/m2 | INTRAVENOUS | Status: DC
  Administered 2018-01-14: 4000 mg via INTRAVENOUS
  Filled 2018-01-14: qty 80

## 2018-01-14 MED ORDER — LEUCOVORIN CALCIUM INJECTION 350 MG
400.0000 mg/m2 | Freq: Once | INTRAVENOUS | Status: AC
  Administered 2018-01-14: 664 mg via INTRAVENOUS
  Filled 2018-01-14: qty 33.2

## 2018-01-14 MED ORDER — PALONOSETRON HCL INJECTION 0.25 MG/5ML
INTRAVENOUS | Status: AC
  Filled 2018-01-14: qty 5

## 2018-01-14 MED ORDER — OXALIPLATIN CHEMO INJECTION 100 MG/20ML
85.0000 mg/m2 | Freq: Once | INTRAVENOUS | Status: AC
  Administered 2018-01-14: 140 mg via INTRAVENOUS
  Filled 2018-01-14: qty 10

## 2018-01-14 MED ORDER — DEXTROSE 5 % IV SOLN
Freq: Once | INTRAVENOUS | Status: AC
  Administered 2018-01-14: 12:00:00 via INTRAVENOUS
  Filled 2018-01-14: qty 250

## 2018-01-14 MED ORDER — SODIUM CHLORIDE 0.9 % IV SOLN
Freq: Once | INTRAVENOUS | Status: AC
  Administered 2018-01-14: 13:00:00 via INTRAVENOUS
  Filled 2018-01-14: qty 5

## 2018-01-14 NOTE — Patient Instructions (Signed)
[facility] [facility] Discharge Instructions for Patients Receiving Chemotherapy  Today you received the following chemotherapy agents: Oxaliplatin, Leucovorin, and Adrucil.  To help prevent nausea and vomiting after your treatment, we encourage you to take your nausea medication as directed.   If you develop nausea and vomiting that is not controlled by your nausea medication, call the clinic.   BELOW ARE SYMPTOMS THAT SHOULD BE REPORTED IMMEDIATELY:  *FEVER GREATER THAN 100.5 F  *CHILLS WITH OR WITHOUT FEVER  NAUSEA AND VOMITING THAT IS NOT CONTROLLED WITH YOUR NAUSEA MEDICATION  *UNUSUAL SHORTNESS OF BREATH  *UNUSUAL BRUISING OR BLEEDING  TENDERNESS IN MOUTH AND THROAT WITH OR WITHOUT PRESENCE OF ULCERS  *URINARY PROBLEMS  *BOWEL PROBLEMS  UNUSUAL RASH Items with * indicate a potential emergency and should be followed up as soon as possible.  Feel free to call the clinic should you have any questions or concerns. The clinic phone number is [phone].  Please show the [place] at check-in to the Emergency Department and triage nurse.

## 2018-01-14 NOTE — Addendum Note (Signed)
Addended by: Owens Shark on: 01/14/2018 02:04 PM   Modules accepted: Orders

## 2018-01-14 NOTE — Progress Notes (Signed)
  [facility] OFFICE PROGRESS NOTE   Diagnosis: Metastatic carcinoma  INTERVAL HISTORY:   Derek Lloyd returns as scheduled.  He completed cycle 5 FOLFOX 12/30/2017.  He denies nausea/vomiting.  No mouth sores.  No diarrhea.  No abdominal distention.  He continues to have intermittent abdominal pain but notes he is requiring less pain medication.  He had some numbness in his hands a few days ago with cold exposure.  None since.  No numbness or tingling in the absence of cold exposure.  Objective:  Vital signs in last 24 hours:  Temperature 98.4, heart rate 81, respirations 18, blood pressure 112/72, weight 125.8    HEENT: No thrush or ulcers. Resp: Lungs clear bilaterally. Cardio: Regular rate and rhythm. GI: Abdomen soft with mild generalized tenderness.  No apparent ascites. Vascular: No leg edema.  Port-A-Cath without erythema.  Lab Results:  Lab Results  Component Value Date   WBC 7.6 01/14/2018   HGB 13.2 01/14/2018   HCT 39.3 01/14/2018   MCV 98.0 01/14/2018   PLT 188 01/14/2018   NEUTROABS 4.9 01/14/2018    Imaging:  No results found.  Medications: I have reviewed the patient's current medications.  Assessment/Plan: 1. Metastatic adenocarcinoma ? 10/02/2017 biopsy of an appendiceal orifice "polyp"revealed invasive adenocarcinoma ? CT abdomen/pelvis 09/26/2017 with evidence of carcinomatosis-omental caking and ascites ? Colonoscopy 10/02/2017 with multiple polyps-tubular adenomas ? Paracentesis 09/24/2017, 09/28/2017-no malignant cells identified ? Paracentesis 11/01/2017-malignant cells consistent with carcinoma ? Biopsy of left peritoneal mass 11/01/2017-poorly differentiated carcinoma ? CT chest 11/01/2017-? Small pleural nodules versus atelectasis, no other evidence of metastatic disease in the chest ? Cycle 1 FOLFOX 11/04/2017 ? Cycle 2 FOLFOX 11/18/2017 ? Cycle 3 FOLFOX 12/02/2017 ? Cycle 4 FOLFOX 12/17/2017 ? Cycle 5 FOLFOX 12/30/2017 ? Cycle 6  FOLFOX 01/14/2018  2.Abdominal pain and bloating secondary to #1-improved  3.Port-A-Cath placement 11/01/2017  4.Nausea following cycle 1 FOLFOX-Emend and Decadron prophylaxis added with cycle 2  Disposition: Derek Lloyd appears stable.  He has completed 5 cycles of FOLFOX.  Plan to proceed with cycle 6 today as scheduled.  We reviewed the CBC from today.  Counts are adequate for treatment.  He is scheduled for restaging CT scans and a follow-up visit with Dr. Clovis Riley at [facility] in the near future.  He will return for a follow-up visit here in 2 weeks.  He will contact the office in the interim with any problems.    Ned Card ANP/GNP-BC   01/14/2018  12:52 PM

## 2018-01-14 NOTE — Progress Notes (Signed)
Nutrition Follow-up:  Patient with metastatic carcinoma of unknown primary.  Followed by Dr. Benay Spice  Met with patient during infusion today.  Patient reports appetite is good.  Does report bowel movement at times shortly after eating or during the night.  Does not describe this as diarrhea, can't tell RD how frequently this happens or if it happens with certain foods (ie roughage, fatty foods, etc). Reports that he eats good sources of protein (chicken, fish, eggs, peanut butter, dairy products).  Has drank ensure in the past.    Medications: reviewed  Labs: reviewed  Anthropometrics:   Weight increased to 125 lb 12.8 oz from 119 lb on 7/30   Estimated Energy Needs  Kcals: 1700-2000 calories Protein: 68-86 g  Fluid: 2 L  NUTRITION DIAGNOSIS: Malnutrition improving with weight gain  MALNUTRITION DIAGNOSIS: severe malnutrition improving   INTERVENTION:  Encouraged patient to discuss bowel habits with MD/NP.  Encouraged good sources of protein and high calorie foods to help with weight gain.  Another case of ensure enlive left at reception area in [place] for patient to pick up after infusion today.     MONITORING, EVALUATION, GOAL: Patient will tolerate increased calories and protein to minimize further weight loss   NEXT VISIT: as needed  Jamyla Ard B. Zenia Resides, [place], [place] Registered Dietitian [phone] (pager)

## 2018-01-16 ENCOUNTER — Telehealth: Payer: Self-pay | Admitting: Oncology

## 2018-01-16 ENCOUNTER — Inpatient Hospital Stay: Payer: Medicaid Other

## 2018-01-16 VITALS — BP 106/71 | HR 67 | Resp 18

## 2018-01-16 DIAGNOSIS — Z5111 Encounter for antineoplastic chemotherapy: Secondary | ICD-10-CM | POA: Diagnosis not present

## 2018-01-16 DIAGNOSIS — C799 Secondary malignant neoplasm of unspecified site: Secondary | ICD-10-CM

## 2018-01-16 MED ORDER — SODIUM CHLORIDE 0.9% FLUSH
10.0000 mL | INTRAVENOUS | Status: DC | PRN
  Administered 2018-01-16: 10 mL
  Filled 2018-01-16: qty 10

## 2018-01-16 MED ORDER — HEPARIN SOD (PORK) LOCK FLUSH 100 UNIT/ML IV SOLN
500.0000 [IU] | Freq: Once | INTRAVENOUS | Status: AC | PRN
  Administered 2018-01-16: 500 [IU]
  Filled 2018-01-16: qty 5

## 2018-01-16 NOTE — Telephone Encounter (Signed)
Patient is awre of appts scheduled per 8/27 los -

## 2018-01-20 ENCOUNTER — Other Ambulatory Visit: Payer: Self-pay | Admitting: Oncology

## 2018-01-23 ENCOUNTER — Other Ambulatory Visit: Payer: Self-pay

## 2018-01-23 DIAGNOSIS — C799 Secondary malignant neoplasm of unspecified site: Secondary | ICD-10-CM

## 2018-01-23 DIAGNOSIS — R112 Nausea with vomiting, unspecified: Secondary | ICD-10-CM

## 2018-01-24 ENCOUNTER — Telehealth: Payer: Self-pay | Admitting: *Deleted

## 2018-01-24 NOTE — Telephone Encounter (Signed)
Patient will need a note for school for last treatment days and the appt on 9/9 when he comes in

## 2018-01-27 ENCOUNTER — Ambulatory Visit: Payer: Medicaid Other | Admitting: Oncology

## 2018-01-27 ENCOUNTER — Telehealth: Payer: Self-pay | Admitting: *Deleted

## 2018-01-27 ENCOUNTER — Other Ambulatory Visit: Payer: Medicaid Other

## 2018-01-27 ENCOUNTER — Encounter: Payer: Medicaid Other | Admitting: Nutrition

## 2018-01-27 ENCOUNTER — Ambulatory Visit: Payer: Medicaid Other

## 2018-01-27 NOTE — Telephone Encounter (Signed)
Left message on home number to follow up on missed appts today. Patient will need to return call and reschedule.

## 2018-01-28 ENCOUNTER — Telehealth: Payer: Self-pay | Admitting: Oncology

## 2018-01-28 NOTE — Telephone Encounter (Signed)
Appt added pt notified per 9/10 sch msg

## 2018-01-29 ENCOUNTER — Encounter: Payer: Self-pay | Admitting: General Practice

## 2018-01-29 NOTE — Progress Notes (Signed)
[place] CSW Progress Notes  Call from patient, wants number for [employer] [facility] to schedule transportation for upcoming appointment at [place] on 9/30 at 9:30 AM.  Number given [phone]), patient also wants to pick up reminder card for this agency.  Wants letter from MD re medical condition - referred to nurse navigator for help w this need.  Edwyna Shell, LCSW Clinical Social Worker Phone:  [phone]

## 2018-01-30 ENCOUNTER — Encounter: Payer: Self-pay | Admitting: *Deleted

## 2018-02-04 ENCOUNTER — Other Ambulatory Visit: Payer: Self-pay | Admitting: Emergency Medicine

## 2018-02-04 ENCOUNTER — Telehealth: Payer: Self-pay | Admitting: Emergency Medicine

## 2018-02-04 ENCOUNTER — Telehealth: Payer: Self-pay

## 2018-02-04 DIAGNOSIS — R112 Nausea with vomiting, unspecified: Secondary | ICD-10-CM

## 2018-02-04 DIAGNOSIS — C799 Secondary malignant neoplasm of unspecified site: Secondary | ICD-10-CM

## 2018-02-04 MED ORDER — OXYCODONE HCL 5 MG PO TABS: 10.0000 mg | ORAL_TABLET | Freq: Three times a day (TID) | ORAL | 0 refills | Status: DC | PRN

## 2018-02-04 MED ORDER — LORAZEPAM 0.5 MG PO TABS: 0.5000 mg | ORAL_TABLET | Freq: Three times a day (TID) | ORAL | 0 refills | Status: DC | PRN

## 2018-02-04 NOTE — Telephone Encounter (Signed)
Pt called back. He knows to pick up rx @ front desk. Her verbalized understanding that schedulers will be calling him to make appt.

## 2018-02-04 NOTE — Progress Notes (Signed)
Refills for pain and anxiety complete. They can be picked up in book in triage. VM left for pt to call back so he can be told this. Also, Dr.Sherrill would like to see the patient the week of 10/21 instead of 11/4. Scheduling message sent. Will inform pt when he calls back.

## 2018-02-04 NOTE — Telephone Encounter (Signed)
Patient called to say that he is out of pain and anxiety meds. His abdomen is hurting "like it did at the beginning." He also has a transient "rolling sensation" on the bottom of his feet. Message sent to Dr. Benay Spice and Pod 2.

## 2018-02-07 ENCOUNTER — Telehealth: Payer: Self-pay | Admitting: Oncology

## 2018-02-07 NOTE — Telephone Encounter (Signed)
Appt scheduled patient notified per 9/17 sch msg

## 2018-03-04 ENCOUNTER — Telehealth: Payer: Self-pay | Admitting: Oncology

## 2018-03-04 NOTE — Telephone Encounter (Signed)
Called pt re setting up a ride for appt on 10/22. Left voicemail with my contact information and for him to call me back and verify the address that he will be picked up at.

## 2018-03-10 ENCOUNTER — Telehealth: Payer: Self-pay | Admitting: Oncology

## 2018-03-10 NOTE — Telephone Encounter (Signed)
Left voicemail with patient to confirm that he has a ride for his appt on 10/22. Told him what time it will arrive and verified address.

## 2018-03-11 ENCOUNTER — Ambulatory Visit: Payer: Medicaid Other | Admitting: Oncology

## 2018-03-12 ENCOUNTER — Telehealth: Payer: Self-pay | Admitting: Oncology

## 2018-03-12 NOTE — Telephone Encounter (Signed)
Called patietn per 10/2 sch message - left message for patient to call back and r/s

## 2018-03-12 NOTE — Progress Notes (Signed)
Called to check on patient after missed appointment on 12/22. Patient had various reeason for not coming. Patient rescheduled for 03/20/18 @ 3:15 PM with Ned Card.

## 2018-03-20 ENCOUNTER — Inpatient Hospital Stay: Payer: Medicaid Other | Admitting: Nurse Practitioner

## 2018-03-24 ENCOUNTER — Ambulatory Visit: Payer: Medicaid Other | Admitting: Oncology

## 2018-03-25 ENCOUNTER — Inpatient Hospital Stay: Payer: BLUE CROSS/BLUE SHIELD | Attending: Oncology | Admitting: Nurse Practitioner

## 2018-03-25 ENCOUNTER — Encounter: Payer: Self-pay | Admitting: *Deleted

## 2018-03-25 ENCOUNTER — Telehealth: Payer: Self-pay | Admitting: Oncology

## 2018-03-25 ENCOUNTER — Encounter: Payer: Self-pay | Admitting: Nurse Practitioner

## 2018-03-25 VITALS — BP 107/71 | HR 112 | Temp 98.2°F | Resp 18 | Ht 70.0 in | Wt 106.6 lb

## 2018-03-25 DIAGNOSIS — C787 Secondary malignant neoplasm of liver and intrahepatic bile duct: Secondary | ICD-10-CM | POA: Diagnosis not present

## 2018-03-25 DIAGNOSIS — Z9081 Acquired absence of spleen: Secondary | ICD-10-CM | POA: Diagnosis not present

## 2018-03-25 DIAGNOSIS — Z9221 Personal history of antineoplastic chemotherapy: Secondary | ICD-10-CM | POA: Diagnosis not present

## 2018-03-25 DIAGNOSIS — Z452 Encounter for adjustment and management of vascular access device: Secondary | ICD-10-CM | POA: Insufficient documentation

## 2018-03-25 DIAGNOSIS — C786 Secondary malignant neoplasm of retroperitoneum and peritoneum: Secondary | ICD-10-CM | POA: Insufficient documentation

## 2018-03-25 DIAGNOSIS — C181 Malignant neoplasm of appendix: Secondary | ICD-10-CM | POA: Diagnosis present

## 2018-03-25 DIAGNOSIS — C799 Secondary malignant neoplasm of unspecified site: Secondary | ICD-10-CM

## 2018-03-25 NOTE — Telephone Encounter (Signed)
Appts scheduled patient notified letter/calendar mailed per 11/5 los

## 2018-03-25 NOTE — Progress Notes (Addendum)
[place] OFFICE PROGRESS NOTE   Diagnosis:  Metastatic carcinoma  INTERVAL HISTORY:   Derek Lloyd returns for follow-up.  He completed cycle 6 FOLFOX 01/14/2018.  He underwent extensive cytoreductive surgery/HIPEC by Dr. Clovis Riley at [facility] on 02/24/2018.  He feels the recovery process is very slow.  He does feel that overall he is better than he was last week.  He has a poor appetite.  He is losing weight.  He notes an alteration in taste.  He has intermittent nausea/vomiting.  Bowels are moving.  Stools are semi-formed.  Abdominal pain is improving.  He is no longer taking pain medication.  Objective:  Vital signs in last 24 hours:  Blood pressure 107/71, pulse (!) 117, temperature 98.2 F (36.8 C), temperature source Oral, resp. rate 18, height 5' 10"  (1.778 m), weight 106 lb 9.6 oz (48.4 kg), SpO2 100 %.    HEENT: No thrush or ulcers. Resp: Lungs clear bilaterally. Cardio: Regular rate and rhythm. GI: Abdomen with mild generalized tenderness.  No apparent ascites.  Healed surgical incision. Vascular: No leg edema. Port-A-Cath without erythema.  Lab Results:  Lab Results  Component Value Date   WBC 7.6 01/14/2018   HGB 13.2 01/14/2018   HCT 39.3 01/14/2018   MCV 98.0 01/14/2018   PLT 188 01/14/2018   NEUTROABS 4.9 01/14/2018    Imaging:  No results found.  Medications: I have reviewed the patient's current medications.  Assessment/Plan: 1. Metastatic adenocarcinoma ? 10/02/2017 biopsy of an appendiceal orifice "polyp"revealed invasive adenocarcinoma ? CT abdomen/pelvis 09/26/2017 with evidence of carcinomatosis-omental caking and ascites ? Colonoscopy 10/02/2017 with multiple polyps-tubular adenomas ? Paracentesis 09/24/2017, 09/28/2017-no malignant cells identified ? Paracentesis 11/01/2017-malignant cells consistent with carcinoma ? Biopsy of left peritoneal mass 11/01/2017-poorly differentiated carcinoma ? CT chest 11/01/2017-? Small pleural nodules  versus atelectasis, no other evidence of metastatic disease in the chest ? Cycle 1 FOLFOX 11/04/2017 ? Cycle 2 FOLFOX 11/18/2017 ? Cycle 3 FOLFOX 12/02/2017 ? Cycle 4 FOLFOX 12/17/2017 ? Cycle 5 FOLFOX 12/30/2017 ? Cycle 6 FOLFOX 01/14/2018 ? [facility] at [facility] 01/15/2018- improved abdominal ascites.  Similar peritoneal metastatic burden with more discrete measurable peritoneal disease adjacent to the splenic flexure of the colon.  Findings of diffuse bowel serosal metastatic involvement. ? 02/24/2018- laparotomy with right diaphragm peritonectomy, splenectomy, right colectomy, sigmoid colectomy, hyperthermic intraperitoneal chemotherapy mitomycin-C, stent placement; R1 resection (invasive adenocarcinoma, poorly differentiated, arising from the appendix; carcinoma invades to the wall of the appendix, into the surrounding adipose tissue and directly invades into the wall of the terminal ileum.  Perineural invasion present.  Total of 22 benign lymph nodes.  Proximal distal margins free of tumor.  Metastatic adenocarcinoma to the omentum.  Benign spleen.  Left gutter stripping metastatic adenocarcinoma; metastatic adenocarcinoma to the peritoneum; right diaphragm stripping metastatic adenocarcinoma; debrided tumor metastatic adenocarcinoma to the peritoneum; round ligament of liver metastatic adenocarcinoma).  2.Abdominal pain and bloating secondary to #1-improved  3.Port-A-Cath placement 11/01/2017  4.Nausea following cycle 1 FOLFOX-Emend and Decadron prophylaxis added with cycle 2  Disposition: Derek Lloyd is slowly recovering from the cytoreductive surgery/HIPEC.  Dr. Benay Spice reviewed the pathology report with him at today's visit.  We discussed the possibility of chemotherapy in the future.  He understands that he is not a candidate for chemotherapy at this time due to his performance status.  He will continue to work on nutrition.  He declines follow-up with the [place]  dietitian.  The [facility] social worker will meet with him  at today's visit.  He will return for a Port-A-Cath flush and follow-up visit in the next 1 to 2 weeks.  He will contact the office in the interim with any problems.  Patient seen with Dr. Benay Spice.  25 minutes were spent face-to-face at today's visit with the majority of that time involved in counseling/coordination of care.    Ned Card ANP/GNP-BC   03/25/2018  9:36 AM   This was a shared visit with Ned Card.  Derek Lloyd was interviewed and examined.  He is recovering from the cytoreductive surgery.  We reviewed the operative note and pathology report.  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  We will consider delivering 4 additional cycles of FOLFOX if his performance status improves over the next several weeks.  I do not recommend further systemic therapy at present.  He will focus on improving his oral intake as tolerated.  He will return for an office visit and Port-A-Cath flush within the next 2 weeks.  Julieanne Manson, MD
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
## 2018-03-27 ENCOUNTER — Telehealth: Payer: Self-pay | Admitting: General Practice

## 2018-03-27 ENCOUNTER — Encounter: Payer: Self-pay | Admitting: General Practice

## 2018-03-27 NOTE — Progress Notes (Signed)
[place] CSW Progress Notes  Referral made to [facility] [phone] or [phone]) for assessment of needs and referral for services.  Patient may qualify for Peer Support services, provider will reach out to patient to offer services and attempt to engage/assist.  Provider aware that housing is patient's most critical need.  CSW also awaiting list of income based housing options from [facility], will share w patient when received.  Edwyna Shell, LCSW Clinical Social Worker Phone:  [phone]

## 2018-03-27 NOTE — Telephone Encounter (Signed)
[place] CSW Progress Notes  Spoke w patient, expressed frustration about unstable housing impacting his ability to cope effectively with cancer diagnosis and treatment.  States he has tried to gain access to multiple sources of help for people experiencing homelessness, including [employer], [employer] and other shelters.  Experiencing significant pain, doubts his ability to make appointments for counseling.  Wants agency that can provide practical help w needs like housing stability as well as increasing his ability to cope w stress of cancer diagnosis and treatment.  "I am dying and I dont have time for all that."  CSW working on finding agency that can help w both practical and psychological support - patient open to receiving calls from agencies willing to help him w his needs.  Patient does not like to talk on phone, wants to return to bed as he is in pain.    Edwyna Shell, LCSW Clinical Social Worker Phone:  [phone]

## 2018-04-02 ENCOUNTER — Encounter: Payer: Self-pay | Admitting: General Practice

## 2018-04-02 NOTE — Progress Notes (Signed)
[place] CSW Progress Notes  VI-SPIDAT completed w patient, referred to [employer] [employer] for consideratio for veteran housing voucher program.  Epimenio Sarin, agency representative, briefed on patient's medical need and history of homelessness.  Patient agreeable to referral.  Edwyna Shell, LCSW Clinical Social Worker Phone:  [phone]
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
## 2018-04-03 ENCOUNTER — Telehealth: Payer: Self-pay | Admitting: *Deleted
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
## 2018-04-04 ENCOUNTER — Inpatient Hospital Stay: Payer: BLUE CROSS/BLUE SHIELD

## 2018-04-04 ENCOUNTER — Inpatient Hospital Stay: Payer: BLUE CROSS/BLUE SHIELD | Admitting: Nurse Practitioner

## 2018-04-10 ENCOUNTER — Telehealth: Payer: Self-pay | Admitting: General Practice

## 2018-04-10 ENCOUNTER — Telehealth: Payer: Self-pay | Admitting: Nurse Practitioner

## 2018-04-10 ENCOUNTER — Encounter: Payer: Self-pay | Admitting: General Practice

## 2018-04-10 NOTE — Progress Notes (Signed)
[place] CSW Progress Notes  Email from [employer], caseworker Duffy Bruce will call him and discuss options.  If patient is agreeable, he can be referred to [facility] which is a [facility].  Agency will update CSW as indicated.  Edwyna Shell, LCSW Clinical Social Worker Phone:  [phone]

## 2018-04-10 NOTE — Progress Notes (Signed)
[place] CSW Progress Notes  Pt has been contacted by [employer] to discuss option of housing in transitional housing program.  Patient does not want to be in program, wants his own apartment.  Currently staying at  [facility].  "I need my own place, I've been saying that from day one."   Has declined to work with any program that "has rules."  Wants to wait until program that provides housing vouchers for veterans is possible.  Plans to stay at [facility] until housing voucher or individual housing is available to him.  Needed to reschedule prior appointment, wants to hear from [facility] about when his upcoming appointments for chemotherapy are being scheduled.  Wants address changed in transportation system - now at [facility] ([place]), requests that driver park by restaurant/store and call him so he can leave his room when ride arrives.  Notes made to transportation system, nurse navigator notified about concern re upcoming appointment schedule.  Edwyna Shell, LCSW Clinical Social Worker Phone:  [phone]

## 2018-04-10 NOTE — Telephone Encounter (Signed)
[place] CSW Progress Note  Patient has been referred to [employer] [employer] program for veterans - agency has referred to [facility] as patient has been in [employer].  Call to patient to check on progress, left VM asking him to return my call.  Edwyna Shell, LCSW Clinical Social Worker Phone:  [phone]

## 2018-04-10 NOTE — Telephone Encounter (Signed)
Tried calling patient but he picked up and then hung up the phone. The patient left a vm that he was upset no one had called him back.

## 2018-04-10 NOTE — Progress Notes (Signed)
  Oncology Nurse Navigator Documentation  Message from LCSW asking me to reach out to patient to confirm appointments. Appointment for 04/14/18 reviewed.

## 2018-04-11 ENCOUNTER — Encounter: Payer: Self-pay | Admitting: General Practice

## 2018-04-11 ENCOUNTER — Telehealth: Payer: Self-pay | Admitting: Nurse Practitioner

## 2018-04-11 NOTE — Progress Notes (Signed)
[place] CSW Progress Notes  Call from patient, frustrated about lack of housing available for him without going through "an institution."  Now that he is living in a motel, per patient he has been told that he does not qualify at this point for veterans housing programs because "he is off the street."  Now plans to stay in motel room for the winter, feels this will be more beneficial to him than trying to work with programs.  "I want my own place, somewhere that I can stay and not get kicked out."  CSW talked w patient about getting on wait lists for subsidized housing options while he continues to stay at [facility] - patient willing to consider that option.  Aware that he has an appt at [facility] on Monday, transportation coordinator has been notified of this need.  Edwyna Shell, LCSW Clinical Social Worker Phone:  [phone]

## 2018-04-11 NOTE — Telephone Encounter (Signed)
Left voicemail for patient regarding ride that was scheduled for appt on 11/25.

## 2018-04-14 ENCOUNTER — Inpatient Hospital Stay: Payer: BLUE CROSS/BLUE SHIELD | Admitting: Nurse Practitioner

## 2018-04-14 ENCOUNTER — Inpatient Hospital Stay: Payer: BLUE CROSS/BLUE SHIELD

## 2018-04-23 ENCOUNTER — Inpatient Hospital Stay: Payer: BLUE CROSS/BLUE SHIELD | Attending: Oncology

## 2018-04-23 ENCOUNTER — Encounter: Payer: Self-pay | Admitting: Nurse Practitioner

## 2018-04-23 ENCOUNTER — Inpatient Hospital Stay (HOSPITAL_BASED_OUTPATIENT_CLINIC_OR_DEPARTMENT_OTHER): Payer: BLUE CROSS/BLUE SHIELD | Admitting: Nurse Practitioner

## 2018-04-23 VITALS — BP 91/69 | HR 106 | Temp 98.4°F | Resp 18 | Ht 70.0 in | Wt 101.2 lb

## 2018-04-23 DIAGNOSIS — C786 Secondary malignant neoplasm of retroperitoneum and peritoneum: Secondary | ICD-10-CM | POA: Diagnosis not present

## 2018-04-23 DIAGNOSIS — R202 Paresthesia of skin: Secondary | ICD-10-CM | POA: Diagnosis not present

## 2018-04-23 DIAGNOSIS — C181 Malignant neoplasm of appendix: Secondary | ICD-10-CM

## 2018-04-23 DIAGNOSIS — R140 Abdominal distension (gaseous): Secondary | ICD-10-CM | POA: Diagnosis not present

## 2018-04-23 DIAGNOSIS — R197 Diarrhea, unspecified: Secondary | ICD-10-CM | POA: Insufficient documentation

## 2018-04-23 DIAGNOSIS — R110 Nausea: Secondary | ICD-10-CM | POA: Diagnosis not present

## 2018-04-23 DIAGNOSIS — C787 Secondary malignant neoplasm of liver and intrahepatic bile duct: Secondary | ICD-10-CM | POA: Insufficient documentation

## 2018-04-23 DIAGNOSIS — R109 Unspecified abdominal pain: Secondary | ICD-10-CM | POA: Insufficient documentation

## 2018-04-23 DIAGNOSIS — C799 Secondary malignant neoplasm of unspecified site: Secondary | ICD-10-CM

## 2018-04-23 DIAGNOSIS — Z95828 Presence of other vascular implants and grafts: Secondary | ICD-10-CM

## 2018-04-23 MED ORDER — PANTOPRAZOLE SODIUM 20 MG PO TBEC: 20.0000 mg | DELAYED_RELEASE_TABLET | Freq: Every day | ORAL | 1 refills | Status: DC

## 2018-04-23 MED ORDER — SODIUM CHLORIDE 0.9% FLUSH
10.0000 mL | Freq: Once | INTRAVENOUS | Status: AC
  Administered 2018-04-23: 10 mL
  Filled 2018-04-23: qty 10

## 2018-04-23 MED ORDER — HEPARIN SOD (PORK) LOCK FLUSH 100 UNIT/ML IV SOLN
500.0000 [IU] | Freq: Once | INTRAVENOUS | Status: AC
  Administered 2018-04-23: 500 [IU]
  Filled 2018-04-23: qty 5

## 2018-04-23 NOTE — Progress Notes (Addendum)
[place] OFFICE PROGRESS NOTE   Diagnosis: Metastatic carcinoma  INTERVAL HISTORY:   Mr. Massie returns for follow-up.  He reports abdominal pain and diarrhea after eating.  He is having 2-3 bowel movements a day.  Appetite overall is poor.  He thinks this is a "mental issue" due to the pain after eating.  He has intermittent nausea.  He denies bleeding.  He urinates after a bowel movement.  No back pain.  No leg weakness or numbness.  He has intermittent tingling in the toes.  Objective:  Vital signs in last 24 hours:  Blood pressure 91/69, pulse (!) 106, temperature 98.4 F (36.9 C), temperature source Oral, resp. rate 18, height 5' 10"  (1.778 m), weight 101 lb 3.2 oz (45.9 kg), SpO2 100 %.    HEENT: No thrush or ulcers. Resp: Lungs clear bilaterally. Cardio: Regular rate and rhythm. GI: Abdomen is soft, mild diffuse tenderness.  No hepatomegaly.  No mass. Vascular: No leg edema.  Port-A-Cath without erythema.  Lab Results:  Lab Results  Component Value Date   WBC 7.6 01/14/2018   HGB 13.2 01/14/2018   HCT 39.3 01/14/2018   MCV 98.0 01/14/2018   PLT 188 01/14/2018   NEUTROABS 4.9 01/14/2018    Imaging:  No results found.  Medications: I have reviewed the patient's current medications.  Assessment/Plan: 1. Metastatic adenocarcinoma ? 10/02/2017 biopsy of an appendiceal orifice "polyp"revealed invasive adenocarcinoma ? CT abdomen/pelvis 09/26/2017 with evidence of carcinomatosis-omental caking and ascites ? Colonoscopy 10/02/2017 with multiple polyps-tubular adenomas ? Paracentesis 09/24/2017, 09/28/2017-no malignant cells identified ? Paracentesis 11/01/2017-malignant cells consistent with carcinoma ? Biopsy of left peritoneal mass 11/01/2017-poorly differentiated carcinoma ? CT chest 11/01/2017-? Small pleural nodules versus atelectasis, no other evidence of metastatic disease in the chest ? Cycle 1 FOLFOX 11/04/2017 ? Cycle 2 FOLFOX  11/18/2017 ? Cycle 3 FOLFOX 12/02/2017 ? Cycle 4 FOLFOX 12/17/2017 ? Cycle 5 FOLFOX 12/30/2017 ? Cycle 6 FOLFOX 01/14/2018 ? [facility] CTs at [facility] 01/15/2018- improved abdominal ascites.  Similar peritoneal metastatic burden with more discrete measurable peritoneal disease adjacent to the splenic flexure of the colon.  Findings of diffuse bowel serosal metastatic involvement. ? 02/24/2018- laparotomy with right diaphragm peritonectomy, splenectomy, right colectomy, sigmoid colectomy, hyperthermic intraperitoneal chemotherapy mitomycin-C, stent placement; R1 resection (invasive adenocarcinoma, poorly differentiated, arising from the appendix; carcinoma invades to the wall of the appendix, into the surrounding adipose tissue and directly invades into the wall of the terminal ileum.  Perineural invasion present.  Total of 22 benign lymph nodes.  Proximal distal margins free of tumor.  Metastatic adenocarcinoma to the omentum.  Benign spleen.  Left gutter stripping metastatic adenocarcinoma; metastatic adenocarcinoma to the peritoneum; right diaphragm stripping metastatic adenocarcinoma; debrided tumor metastatic adenocarcinoma to the peritoneum; round ligament of liver metastatic adenocarcinoma).  2.Abdominal pain and bloating secondary to #1-improved  3.Port-A-Cath placement 11/01/2017  4.Nausea following cycle 1 FOLFOX-Emend and Decadron prophylaxis added with cycle 2   Disposition: Mr. Tallarico appears unchanged.  He continues to have a poor performance status.  He understands he is not a candidate for additional chemotherapy in his current condition.  He will begin Protonix 20 mg daily.  He will continue to work on nutrition.  He will return for labs to include a CBC, chemistry panel and CEA and follow-up visit on 05/02/2018.  He will contact the office in the interim with any problems.  Patient seen with Dr. Benay Spice.    Ned Card ANP/GNP-BC   04/23/2018  2:12 PM  This was a  follow-up visit with Ned Card.  Mr. Mollett was interviewed and examined.  He continues to recover from the cytoreductive surgery.  I suspect his symptoms are related to surgery.  He is not a candidate for further chemotherapy unless his performance status improves.  He will return for an office visit next week.  Julieanne Manson, MD

## 2018-05-02 ENCOUNTER — Encounter: Payer: Self-pay | Admitting: General Practice

## 2018-05-02 ENCOUNTER — Inpatient Hospital Stay: Payer: BLUE CROSS/BLUE SHIELD

## 2018-05-02 ENCOUNTER — Inpatient Hospital Stay (HOSPITAL_BASED_OUTPATIENT_CLINIC_OR_DEPARTMENT_OTHER): Payer: BLUE CROSS/BLUE SHIELD | Admitting: Oncology

## 2018-05-02 VITALS — BP 87/54 | HR 92 | Temp 98.2°F | Resp 18 | Wt 102.6 lb

## 2018-05-02 DIAGNOSIS — R109 Unspecified abdominal pain: Secondary | ICD-10-CM

## 2018-05-02 DIAGNOSIS — C787 Secondary malignant neoplasm of liver and intrahepatic bile duct: Secondary | ICD-10-CM

## 2018-05-02 DIAGNOSIS — R202 Paresthesia of skin: Secondary | ICD-10-CM

## 2018-05-02 DIAGNOSIS — C181 Malignant neoplasm of appendix: Secondary | ICD-10-CM | POA: Diagnosis not present

## 2018-05-02 DIAGNOSIS — R197 Diarrhea, unspecified: Secondary | ICD-10-CM

## 2018-05-02 DIAGNOSIS — C799 Secondary malignant neoplasm of unspecified site: Secondary | ICD-10-CM

## 2018-05-02 DIAGNOSIS — C786 Secondary malignant neoplasm of retroperitoneum and peritoneum: Secondary | ICD-10-CM | POA: Diagnosis not present

## 2018-05-02 DIAGNOSIS — Z95828 Presence of other vascular implants and grafts: Secondary | ICD-10-CM

## 2018-05-02 DIAGNOSIS — R140 Abdominal distension (gaseous): Secondary | ICD-10-CM

## 2018-05-02 LAB — CBC WITH DIFFERENTIAL (CANCER CENTER ONLY)
Abs Immature Granulocytes: 0.02 10*3/uL (ref 0.00–0.07)
BASOS ABS: 0.1 10*3/uL (ref 0.0–0.1)
BASOS PCT: 1 %
Eosinophils Absolute: 0.2 10*3/uL (ref 0.0–0.5)
Eosinophils Relative: 3 %
HCT: 38 % — ABNORMAL LOW (ref 39.0–52.0)
Hemoglobin: 12.1 g/dL — ABNORMAL LOW (ref 13.0–17.0)
Immature Granulocytes: 0 %
Lymphocytes Relative: 31 %
Lymphs Abs: 2.5 10*3/uL (ref 0.7–4.0)
MCH: 30.6 pg (ref 26.0–34.0)
MCHC: 31.8 g/dL (ref 30.0–36.0)
MCV: 96 fL (ref 80.0–100.0)
Monocytes Absolute: 0.9 10*3/uL (ref 0.1–1.0)
Monocytes Relative: 11 %
NRBC: 0 % (ref 0.0–0.2)
Neutro Abs: 4.4 10*3/uL (ref 1.7–7.7)
Neutrophils Relative %: 54 %
Platelet Count: 452 10*3/uL — ABNORMAL HIGH (ref 150–400)
RBC: 3.96 MIL/uL — AB (ref 4.22–5.81)
RDW: 15.2 % (ref 11.5–15.5)
WBC: 8 10*3/uL (ref 4.0–10.5)

## 2018-05-02 LAB — CMP (CANCER CENTER ONLY)
ALT: 33 U/L (ref 0–44)
AST: 26 U/L (ref 15–41)
Albumin: 3.3 g/dL — ABNORMAL LOW (ref 3.5–5.0)
Alkaline Phosphatase: 105 U/L (ref 38–126)
Anion gap: 9 (ref 5–15)
BUN: 6 mg/dL (ref 6–20)
CO2: 23 mmol/L (ref 22–32)
Calcium: 9.1 mg/dL (ref 8.9–10.3)
Chloride: 108 mmol/L (ref 98–111)
Creatinine: 0.71 mg/dL (ref 0.61–1.24)
GFR, Est AFR Am: >60 mL/min (ref >60)
Glucose, Bld: 109 mg/dL — ABNORMAL HIGH (ref 70–99)
Potassium: 3.6 mmol/L (ref 3.5–5.1)
Sodium: 140 mmol/L (ref 135–145)
Total Bilirubin: 0.5 mg/dL (ref 0.3–1.2)
Total Protein: 6.7 g/dL (ref 6.5–8.1)

## 2018-05-02 LAB — CEA (IN HOUSE-CHCC): CEA (CHCC-In House): 3.47 ng/mL (ref 0.00–5.00)

## 2018-05-02 MED ORDER — HEPARIN SOD (PORK) LOCK FLUSH 100 UNIT/ML IV SOLN
500.0000 [IU] | Freq: Once | INTRAVENOUS | Status: AC
  Administered 2018-05-02: 500 [IU]
  Filled 2018-05-02: qty 5

## 2018-05-02 MED ORDER — SODIUM CHLORIDE 0.9% FLUSH
10.0000 mL | Freq: Once | INTRAVENOUS | Status: AC
  Administered 2018-05-02: 10 mL
  Filled 2018-05-02: qty 10

## 2018-05-02 NOTE — Progress Notes (Signed)
[place] OFFICE PROGRESS NOTE   Diagnosis: Appendix cancer  INTERVAL HISTORY:   Derek Lloyd returns for a scheduled visit.  He reports feeling better compared to when we saw him last week.  He continues to have postprandial pain and soreness at the low abdomen.  No new complaint.  Objective:  Vital signs in last 24 hours:  Blood pressure (!) 87/54, pulse 92, temperature 98.2 F (36.8 C), temperature source Oral, resp. rate 18, weight 102 lb 9.6 oz (46.5 kg), SpO2 100 %.  Resp: Lungs clear bilaterally Cardio: Regular rate and rhythm GI: No hepatosplenomegaly, tender at the low mid abdomen, no mass, suture material or scar tissue to the left of midline in the low abdomen Vascular: No leg edema  Portacath/PICC-without erythema  Lab Results:  Lab Results  Component Value Date   WBC 8.0 05/02/2018   HGB 12.1 (L) 05/02/2018   HCT 38.0 (L) 05/02/2018   MCV 96.0 05/02/2018   PLT 452 (H) 05/02/2018   NEUTROABS 4.4 05/02/2018    CMP  Lab Results  Component Value Date   NA 140 05/02/2018   K 3.6 05/02/2018   CL 108 05/02/2018   CO2 23 05/02/2018   GLUCOSE 109 (H) 05/02/2018   BUN 6 05/02/2018   CREATININE 0.71 05/02/2018   CALCIUM 9.1 05/02/2018   PROT 6.7 05/02/2018   ALBUMIN 3.3 (L) 05/02/2018   AST 26 05/02/2018   ALT 33 05/02/2018   ALKPHOS 105 05/02/2018   BILITOT 0.5 05/02/2018   GFRNONAA >60 05/02/2018   GFRAA >60 05/02/2018    Lab Results  Component Value Date   CEA1 6.04 (H) 12/30/2017     Medications: I have reviewed the patient's current medications.   Assessment/Plan:  1. Metastatic adenocarcinoma of the appendix ? 10/02/2017 biopsy of an appendiceal orifice "polyp"revealed invasive adenocarcinoma ? CT abdomen/pelvis 09/26/2017 with evidence of carcinomatosis-omental caking and ascites ? Colonoscopy 10/02/2017 with multiple polyps-tubular adenomas ? Paracentesis 09/24/2017, 09/28/2017-no malignant cells identified ? Paracentesis  11/01/2017-malignant cells consistent with carcinoma ? Biopsy of left peritoneal mass 11/01/2017-poorly differentiated carcinoma ? CT chest 11/01/2017-? Small pleural nodules versus atelectasis, no other evidence of metastatic disease in the chest ? Cycle 1 FOLFOX 11/04/2017 ? Cycle 2 FOLFOX 11/18/2017 ? Cycle 3 FOLFOX 12/02/2017 ? Cycle 4 FOLFOX 12/17/2017 ? Cycle 5 FOLFOX 12/30/2017 ? Cycle 6 FOLFOX 01/14/2018 ? [facility] CTs at [facility] 01/15/2018- improved abdominal ascites.  Similar peritoneal metastatic burden with more discrete measurable peritoneal disease adjacent to the splenic flexure of the colon.  Findings of diffuse bowel serosal metastatic involvement. ? 02/24/2018- laparotomy with right diaphragm peritonectomy, splenectomy, right colectomy, sigmoid colectomy, hyperthermic intraperitoneal chemotherapy mitomycin-C, stent placement; R1 resection (invasive adenocarcinoma, poorly differentiated, arising from the appendix; carcinoma invades to the wall of the appendix, into the surrounding adipose tissue and directly invades into the wall of the terminal ileum.  Perineural invasion present.  Total of 22 benign lymph nodes.  Proximal distal margins free of tumor.  Metastatic adenocarcinoma to the omentum.  Benign spleen.  Left gutter stripping metastatic adenocarcinoma; metastatic adenocarcinoma to the peritoneum; right diaphragm stripping metastatic adenocarcinoma; debrided tumor metastatic adenocarcinoma to the peritoneum; round ligament of liver metastatic adenocarcinoma).  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  He is recovering from cytoreductive surgery.  His performance status has improved partially over the past  week.  He will try to eat frequent small meals and use nutrition  supplements.  He will return for an office visit in approximately 3 weeks.  15 minutes were spent with the patient today.  The majority of the time was used for counseling and coordination of care.  Betsy Coder, MD  05/02/2018  1:04 PM

## 2018-05-02 NOTE — Progress Notes (Signed)
[place] CSW Progress Notes  Samples of Ensure provided to  Christoper Allegra for distribution to patient at MD request.  Edwyna Shell, LCSW Clinical Social Worker Phone:  [phone]

## 2018-05-23 ENCOUNTER — Telehealth: Payer: Self-pay

## 2018-05-27 ENCOUNTER — Ambulatory Visit: Payer: Medicaid Other | Admitting: Nurse Practitioner

## 2018-05-27 ENCOUNTER — Other Ambulatory Visit: Payer: Medicaid Other

## 2018-05-29 NOTE — Telephone Encounter (Signed)
Error

## 2018-06-02 ENCOUNTER — Telehealth: Payer: Self-pay | Admitting: Oncology

## 2018-06-02 NOTE — Telephone Encounter (Signed)
Called patient per 01/07 voicemail log.  Rescheduled patient appointment for 01/17.  Patient aware of appointment.

## 2018-06-06 ENCOUNTER — Other Ambulatory Visit: Payer: Medicaid Other

## 2018-06-06 ENCOUNTER — Ambulatory Visit: Payer: Medicaid Other | Admitting: Nurse Practitioner

## 2018-07-10 ENCOUNTER — Telehealth: Payer: Self-pay | Admitting: Oncology

## 2018-07-10 ENCOUNTER — Telehealth: Payer: Self-pay

## 2018-07-10 ENCOUNTER — Telehealth: Payer: Self-pay | Admitting: General Practice

## 2018-07-10 ENCOUNTER — Encounter: Payer: Self-pay | Admitting: General Practice

## 2018-07-10 NOTE — Telephone Encounter (Signed)
[facility] CSW Progress Notes  Call from patient, states that he is "about to be evicted" from his lodging at [facility] because he is unable to pay his bills there.  "I will be out on the street, its cold, I have nowhere to go."  States he has no minutes on his cell phone, so asks that calls be made to him at [facility] ([phone] room [phone] or [phone]).  States he will be evicted on Sat 2/22.  Asked for referral letter to [employer] for emergency financial assistance, states letter needs to be sent to Tyra Clymer and Lillie Brown.  Letter prepared and sent at patient request, VM also left for Tyra.  Talked w Nurse Navigator to discuss current treatment situation, is a current patient however has no showed for last two appointments.  Patient states "my ride that you guys arranged never came, I waited outside for an hour in the cold and they never showed."  States he cannot ride [place] because he "poops on myself" unexpectedly - "they are going to throw me off because people complain."  Advised that patient has access to Medicaid transport -advised to call DSS to enroll in program.  Also can be referred to [facility], however these rides will incur a charge for each ride while Medicaid transport if free.  CSW reminded patient that he had been referred to community resources for unstably housed or homeless individuals - was working w Dolly (caseworker w [facility]) who was working on Rapid Rehousing options when he met the service definition of homelessness (couch surfing/staying w friends place to place).  Unfortunately, in the midst of this effort to help him find stable housing, patient rented a room himself at motel which made him ineligible for this program.  Dolly offered other options which were transitional housing in nature; patient refused these referrals as he did not like the behavioral expectations needed to remain in these programs.  Voiced significant frustration w his  situation "I'm dying and no one wants to help me."   Frustrated that "I waited an hour in the cold and the transport you guys arranged never came."  [facility] last two appointments at [facility], last seen in Dec 2019.   Has gotten increase in Food Stamps after going to DSS and asking for reconsideration as he is paying for his lodging.  Feels he cannot gain weight needed to resume chemotherapy if Food Stamp budget is inadequate.  Nurse Navigator informed of the above.  Per patient, he will be at [facility] until Sat 2/22 when he faces eviction - can be reached at [phone] Room [phone] or [phone].   , LCSW Clinical Social Worker Phone:  [phone]  

## 2018-07-10 NOTE — Telephone Encounter (Signed)
Scheduled appt per 2/20 sch message - pt is aware of appt .

## 2018-07-10 NOTE — Telephone Encounter (Signed)
Called patient to discuss his no show appointments and his requests for social support (see note dated 07/10/18 from [place]). Reviewed  the resources that have been provided for housing and the choices that he has made  that have derailed these options. At this point I suggested that he make every effort to get to next appointment on 07/18/18 @ 2:15 PM. Burman Nieves, transportation specialist, has already reached out to patient to arrange transportation. Patient has Alison's contact number if housing changes between now and and 07/18/18.

## 2018-07-10 NOTE — Telephone Encounter (Signed)
Called patient to let him know that his ride was scheduled for 2/28 - patient let me know that his phone is going to be cut off and that he will take the bus next week to ensure that he will arrive on time.

## 2018-07-17 ENCOUNTER — Encounter: Payer: Self-pay | Admitting: General Practice

## 2018-07-17 NOTE — Progress Notes (Signed)
[facility] CSW Progress Note  Patient was referred to [facility] for emergency financial assistance based on his report that he would lose his hotel room on 2/22.  [employer] worked [employer] to provide one weeks funding for hotel.    Edwyna Shell, LCSW Clinical Social Worker Phone:  [phone]

## 2018-07-18 ENCOUNTER — Inpatient Hospital Stay: Payer: Medicaid Other | Admitting: Nurse Practitioner

## 2018-07-18 NOTE — Progress Notes (Signed)
  Oncology Nurse Navigator Documentation  Navigator Location: [facility] (07/18/18 1122)   )Navigator Encounter Type: Telephone (07/18/18 1122) Telephone: Lahoma Crocker Call;Appt Confirmation/Clarification (07/18/18 1122)    Left VM advising of new appointment on 07/22/18 @ 12:30 with Dr. Benay Spice and that transportation coordinator would be scheduling his ride to [facility]. I advised him to reach out to Burman Nieves to provide his current address.                        Interventions: Coordination of Care (07/18/18 1122)            Acuity: Level 1 (07/18/18 1122)         Time Spent with Patient: 15 (07/18/18 1122)

## 2018-07-22 ENCOUNTER — Inpatient Hospital Stay: Payer: Medicaid Other | Attending: Oncology | Admitting: Oncology

## 2018-07-22 ENCOUNTER — Encounter: Payer: Self-pay | Admitting: Nutrition

## 2018-07-22 ENCOUNTER — Other Ambulatory Visit: Payer: Self-pay

## 2018-07-22 VITALS — BP 117/88 | HR 102 | Temp 98.1°F | Resp 20 | Ht 70.0 in | Wt 103.4 lb

## 2018-07-22 DIAGNOSIS — R109 Unspecified abdominal pain: Secondary | ICD-10-CM | POA: Diagnosis not present

## 2018-07-22 DIAGNOSIS — R159 Full incontinence of feces: Secondary | ICD-10-CM | POA: Insufficient documentation

## 2018-07-22 DIAGNOSIS — C181 Malignant neoplasm of appendix: Secondary | ICD-10-CM | POA: Diagnosis present

## 2018-07-22 DIAGNOSIS — R140 Abdominal distension (gaseous): Secondary | ICD-10-CM | POA: Insufficient documentation

## 2018-07-22 DIAGNOSIS — Z95828 Presence of other vascular implants and grafts: Secondary | ICD-10-CM

## 2018-07-22 DIAGNOSIS — C786 Secondary malignant neoplasm of retroperitoneum and peritoneum: Secondary | ICD-10-CM | POA: Insufficient documentation

## 2018-07-22 DIAGNOSIS — C787 Secondary malignant neoplasm of liver and intrahepatic bile duct: Secondary | ICD-10-CM | POA: Insufficient documentation

## 2018-07-22 MED ORDER — HEPARIN SOD (PORK) LOCK FLUSH 100 UNIT/ML IV SOLN
500.0000 [IU] | Freq: Once | INTRAVENOUS | Status: AC
  Administered 2018-07-22: 500 [IU] via INTRAVENOUS
  Filled 2018-07-22: qty 5

## 2018-07-22 MED ORDER — SODIUM CHLORIDE 0.9% FLUSH
10.0000 mL | Freq: Once | INTRAVENOUS | Status: AC
  Administered 2018-07-22: 10 mL via INTRAVENOUS
  Filled 2018-07-22: qty 10

## 2018-07-22 NOTE — Progress Notes (Signed)
[facility] OFFICE PROGRESS NOTE   Diagnosis: Appendiceal cancer  INTERVAL HISTORY:   Derek Lloyd returns after missing a scheduled follow-up appointments over the past few months.  He has multiple complaints including postprandial pain, frequent bowel movements with episodes of fecal incontinence, and pain at the suprapubic "mesh "area.  He has pain with bowel movements.  The bowel movements are semi-formed.  His appetite has improved.  Objective:  Vital signs in last 24 hours:  Blood pressure 117/88, pulse (!) 102, temperature 98.1 F (36.7 C), temperature source Oral, resp. rate 20, height 5' 10"  (1.778 m), weight 103 lb 6.4 oz (46.9 kg), SpO2 100 %.    HEENT: Neck without mass Lymphatics: No cervical or supraclavicular nodes.  "Shotty "bilateral axillary and inguinal nodes Resp: Lungs clear bilaterally Cardio: Regular rate and rhythm GI: No mass, no apparent ascites, no hepatosplenomegaly, tender in the mid upper abdomen and suprapubic areas Vascular: No leg edema Rectal: External exam reveals soft external hemorrhoids at the inferior anal verge  Portacath/PICC-without erythema  Lab Results:  Lab Results  Component Value Date   WBC 8.0 05/02/2018   HGB 12.1 (L) 05/02/2018   HCT 38.0 (L) 05/02/2018   MCV 96.0 05/02/2018   PLT 452 (H) 05/02/2018   NEUTROABS 4.4 05/02/2018    CMP  Lab Results  Component Value Date   NA 140 05/02/2018   K 3.6 05/02/2018   CL 108 05/02/2018   CO2 23 05/02/2018   GLUCOSE 109 (H) 05/02/2018   BUN 6 05/02/2018   CREATININE 0.71 05/02/2018   CALCIUM 9.1 05/02/2018   PROT 6.7 05/02/2018   ALBUMIN 3.3 (L) 05/02/2018   AST 26 05/02/2018   ALT 33 05/02/2018   ALKPHOS 105 05/02/2018   BILITOT 0.5 05/02/2018   GFRNONAA >60 05/02/2018   GFRAA >60 05/02/2018    Lab Results  Component Value Date   CEA1 3.47 05/02/2018     Medications: I have reviewed the patient's current  medications.   Assessment/Plan: 1. Metastatic adenocarcinoma of the appendix ? 10/02/2017 biopsy of an appendiceal orifice "polyp"revealed invasive adenocarcinoma ? CT abdomen/pelvis 09/26/2017 with evidence of carcinomatosis-omental caking and ascites ? Colonoscopy 10/02/2017 with multiple polyps-tubular adenomas ? Paracentesis 09/24/2017, 09/28/2017-no malignant cells identified ? Paracentesis 11/01/2017-malignant cells consistent with carcinoma ? Biopsy of left peritoneal mass 11/01/2017-poorly differentiated carcinoma ? CT chest 11/01/2017-? Small pleural nodules versus atelectasis, no other evidence of metastatic disease in the chest ? Cycle 1 FOLFOX 11/04/2017 ? Cycle 2 FOLFOX 11/18/2017 ? Cycle 3 FOLFOX 12/02/2017 ? Cycle 4 FOLFOX 12/17/2017 ? Cycle 5 FOLFOX 12/30/2017 ? Cycle 6 FOLFOX 01/14/2018 ? [facility] at [facility] 01/15/2018- improved abdominal ascites.  Similar peritoneal metastatic burden with more discrete measurable peritoneal disease adjacent to the splenic flexure of the colon.  Findings of diffuse bowel serosal metastatic involvement. ? 02/24/2018- laparotomy with right diaphragm peritonectomy, splenectomy, right colectomy, sigmoid colectomy, hyperthermic intraperitoneal chemotherapy mitomycin-C, stent placement; R1 resection (invasive adenocarcinoma, poorly differentiated, arising from the appendix; carcinoma invades to the wall of the appendix, into the surrounding adipose tissue and directly invades into the wall of the terminal ileum.  Perineural invasion present.  Total of 22 benign lymph nodes.  Proximal distal margins free of tumor.  Metastatic adenocarcinoma to the omentum.  Benign spleen.  Left gutter stripping metastatic adenocarcinoma; metastatic adenocarcinoma to the peritoneum; right diaphragm stripping metastatic adenocarcinoma; debrided tumor metastatic adenocarcinoma to the peritoneum; round ligament of liver metastatic adenocarcinoma).  2.Abdominal pain and bloating  secondary  to #1-improved  3.Port-A-Cath placement 11/01/2017  4.Nausea following cycle 1 FOLFOX-Emend and Decadron prophylaxis added with cycle 2    Disposition: Derek Lloyd has a history of metastatic appendiceal cancer.  He continues to recover from the cytoreductive surgery in October 2019.  There is no clinical evidence of disease progression.  He will try sitz bath and Anusol cream for the hemorrhoids.  We gave him samples of a nutrition supplement today.  The Port-A-Cath was flushed today.  He will return for an office visit and Port-A-Cath flush in 6 weeks.  I do not recommend systemic chemotherapy unless he develops clinical or x-ray evidence of disease progression.  He is scheduled for a follow-up appointment and restaging CTs with Dr. Clovis Riley in April.  Betsy Coder, MD  07/22/2018  1:23 PM

## 2018-07-22 NOTE — Progress Notes (Signed)
Obtained a case of vanilla Ensure from dietician and gave to patient.

## 2018-07-22 NOTE — Progress Notes (Signed)
Provided 1 complementary case of Ensure Enlive.

## 2018-07-23 ENCOUNTER — Telehealth: Payer: Self-pay | Admitting: Oncology

## 2018-07-23 NOTE — Telephone Encounter (Signed)
Scheduled appt per 3/3 los - called pt - unable to reach pt. Left message and sent reminder letter in the mail.

## 2018-08-18 ENCOUNTER — Telehealth: Payer: Self-pay | Admitting: *Deleted

## 2018-08-18 NOTE — Telephone Encounter (Signed)
Asking for appointment on 09/02/18 be moved to early May due to virus precautions and he is seeing Dr. Clovis Riley on 09/17/18. Informed him also that his port can be flushed in May as well. Scheduling message sent to move appointment. He is also asking to speak with disability specialist about his last disability claim. Was not able to connect him to [employer]. Sent in-basket message requesting she call patient.

## 2018-08-19 ENCOUNTER — Encounter: Payer: Self-pay | Admitting: General Practice

## 2018-08-19 ENCOUNTER — Telehealth: Payer: Self-pay | Admitting: General Practice

## 2018-08-19 NOTE — Telephone Encounter (Signed)
[place] CSW Progress Notes  Pt left VM, returned call.  Pt wants to connect w individual who helped him file his SSI claim - wants to be sure they have his current address.  Attempted to give patient contact information for [facility], who is the agency who assisted him w SSI application.  Pt states he does not have way to write down number and believes it is [employer].  Manuela Schwartz RN has asked L Bunton to call him.  If this is not who he needs to speak to, pls give him contact information for [facility] [phone]).  Pt can also correct his address online w [employer].  Edwyna Shell, LCSW Clinical Social Worker Phone:  [phone]

## 2018-08-19 NOTE — Progress Notes (Signed)
[place] CSW [place] Notes  Per [facility], "he needs to call [employer] himself. The number is [phone] The offices are closed to people coming in but clients can still call in. Most are working from home so he may have to leave a message. He can also go on their website. If he has not created a my social security account, tell him to go to [email or web address] and create an account. He can change his address that way at any time."  VM left for patient w this information.  Edwyna Shell, LCSW Clinical Social Worker Phone:  [phone]

## 2018-08-20 ENCOUNTER — Telehealth: Payer: Self-pay | Admitting: Oncology

## 2018-08-20 NOTE — Telephone Encounter (Signed)
Scheduled appt per 3/30 sch message - left message and sent reminder letter in the mail

## 2018-09-01 ENCOUNTER — Telehealth: Payer: Self-pay | Admitting: *Deleted

## 2018-09-01 NOTE — Telephone Encounter (Signed)
Called patient to follow up on his call to answering service on 08/30/18 with chest pain and sensation of getting ready to pass out. He was instructed to call 911 and did not follow this advice He reports the episode resolved after he got up and walked around and he passed gas and belched and pain was gone. Thinks it was GI. Appreciated the follow up call.

## 2018-09-02 ENCOUNTER — Ambulatory Visit: Payer: Medicaid Other | Admitting: Nurse Practitioner

## 2018-09-29 ENCOUNTER — Inpatient Hospital Stay: Payer: Medicaid Other

## 2018-09-29 ENCOUNTER — Telehealth: Payer: Self-pay | Admitting: *Deleted

## 2018-09-29 ENCOUNTER — Inpatient Hospital Stay: Payer: Medicaid Other | Admitting: Oncology

## 2018-09-29 NOTE — Telephone Encounter (Signed)
Called patient to follow up on his missed appointment today. He reports leaving a message earlier today that he is not coming. Wants to have his postop follow up with Dr. Clovis Riley before coming here. Was supposed to see him in April and his transportation fell through (uses [facility] and they are not transporting due to [place]).  Asking if Dr. Benay Spice could communicate with Dr. Clovis Riley as to his path results and maybe schedule his CT scan/labs locally since he can't get there. He wants to have port removed if he is not going to use it anymore. Transportation is not an issue at [facility] since [facility] Popper is provided. Will make MD aware of his request to coordinate with Dr. Clovis Riley.

## 2018-09-30 ENCOUNTER — Telehealth: Payer: Self-pay | Admitting: Oncology

## 2018-09-30 NOTE — Telephone Encounter (Signed)
Called pt per 5/11 sch message - unable to reach pt left message for patient to call back to r/s appt.

## 2018-10-03 ENCOUNTER — Telehealth: Payer: Self-pay | Admitting: *Deleted

## 2018-10-03 NOTE — Telephone Encounter (Signed)
Regarding conversation w/patient on 09/30/18: Dr. Benay Spice wants him to come in the next two weeks for lab/flush and OV. Will need CEA checked. Dr. Benay Spice will go over his path report with him in detail and is able to coordinate his CT scans to be done here and results can be forwarded to Dr. Clovis Riley at [facility]. Transportation will not be an issue for him in [place]. Left VM for patient to call next week to discuss MD response.

## 2018-10-07 ENCOUNTER — Telehealth: Payer: Self-pay | Admitting: *Deleted

## 2018-10-07 ENCOUNTER — Other Ambulatory Visit: Payer: Self-pay | Admitting: *Deleted

## 2018-10-07 DIAGNOSIS — C181 Malignant neoplasm of appendix: Secondary | ICD-10-CM

## 2018-10-07 DIAGNOSIS — C799 Secondary malignant neoplasm of unspecified site: Secondary | ICD-10-CM

## 2018-10-07 NOTE — Telephone Encounter (Signed)
Informed him that Dr. Benay Spice needs to see him in next two weeks with lab/flush and he will review his last scan and pathology with him. We can order his scan here and push results over to Dr. Clovis Riley, so his visit next month there can be a telephone visit instead of in person. He agrees to this. Scheduling message sent and sent message to transportation coordinator to set up his [employer] Popper transport that day.

## 2018-10-10 ENCOUNTER — Telehealth: Payer: Self-pay | Admitting: Oncology

## 2018-10-10 NOTE — Telephone Encounter (Signed)
Scheduled appts per sch msg. Called and left msg for patient

## 2018-10-16 ENCOUNTER — Telehealth: Payer: Self-pay | Admitting: Oncology

## 2018-10-16 NOTE — Telephone Encounter (Signed)
Patient called and I reschedule 6/2

## 2018-10-21 ENCOUNTER — Inpatient Hospital Stay: Payer: Medicaid Other | Attending: Oncology

## 2018-10-21 ENCOUNTER — Inpatient Hospital Stay: Payer: Medicaid Other

## 2018-10-21 ENCOUNTER — Other Ambulatory Visit: Payer: Medicaid Other

## 2018-10-21 ENCOUNTER — Inpatient Hospital Stay (HOSPITAL_BASED_OUTPATIENT_CLINIC_OR_DEPARTMENT_OTHER): Payer: Medicaid Other | Admitting: Oncology

## 2018-10-21 ENCOUNTER — Other Ambulatory Visit: Payer: Self-pay

## 2018-10-21 ENCOUNTER — Telehealth: Payer: Self-pay | Admitting: Oncology

## 2018-10-21 ENCOUNTER — Ambulatory Visit: Payer: Medicaid Other | Admitting: Oncology

## 2018-10-21 VITALS — BP 121/79 | HR 90 | Temp 98.4°F | Resp 18 | Ht 70.0 in | Wt 114.2 lb

## 2018-10-21 DIAGNOSIS — F17210 Nicotine dependence, cigarettes, uncomplicated: Secondary | ICD-10-CM

## 2018-10-21 DIAGNOSIS — C787 Secondary malignant neoplasm of liver and intrahepatic bile duct: Secondary | ICD-10-CM | POA: Insufficient documentation

## 2018-10-21 DIAGNOSIS — C786 Secondary malignant neoplasm of retroperitoneum and peritoneum: Secondary | ICD-10-CM

## 2018-10-21 DIAGNOSIS — R1013 Epigastric pain: Secondary | ICD-10-CM | POA: Insufficient documentation

## 2018-10-21 DIAGNOSIS — F419 Anxiety disorder, unspecified: Secondary | ICD-10-CM

## 2018-10-21 DIAGNOSIS — R140 Abdominal distension (gaseous): Secondary | ICD-10-CM | POA: Diagnosis not present

## 2018-10-21 DIAGNOSIS — C799 Secondary malignant neoplasm of unspecified site: Secondary | ICD-10-CM

## 2018-10-21 DIAGNOSIS — C181 Malignant neoplasm of appendix: Secondary | ICD-10-CM | POA: Insufficient documentation

## 2018-10-21 DIAGNOSIS — Z95828 Presence of other vascular implants and grafts: Secondary | ICD-10-CM

## 2018-10-21 LAB — CMP (CANCER CENTER ONLY)
ALT: 27 U/L (ref 0–44)
AST: 27 U/L (ref 15–41)
Albumin: 3.9 g/dL (ref 3.5–5.0)
Alkaline Phosphatase: 94 U/L (ref 38–126)
Anion gap: 12 (ref 5–15)
BUN: 10 mg/dL (ref 6–20)
CO2: 22 mmol/L (ref 22–32)
Calcium: 9.3 mg/dL (ref 8.9–10.3)
Chloride: 109 mmol/L (ref 98–111)
Creatinine: 0.82 mg/dL (ref 0.61–1.24)
GFR, Est AFR Am: >60 mL/min (ref >60)
GFR, Estimated: >60 mL/min (ref >60)
Glucose, Bld: 92 mg/dL (ref 70–99)
Potassium: 3.8 mmol/L (ref 3.5–5.1)
Sodium: 143 mmol/L (ref 135–145)
Total Bilirubin: 0.9 mg/dL (ref 0.3–1.2)
Total Protein: 7.3 g/dL (ref 6.5–8.1)

## 2018-10-21 LAB — CBC WITH DIFFERENTIAL (CANCER CENTER ONLY)
Abs Immature Granulocytes: 0.06 10*3/uL (ref 0.00–0.07)
Basophils Absolute: 0.1 10*3/uL (ref 0.0–0.1)
Basophils Relative: 1 %
Eosinophils Absolute: 0.2 10*3/uL (ref 0.0–0.5)
Eosinophils Relative: 2 %
HCT: 44.5 % (ref 39.0–52.0)
Hemoglobin: 14.5 g/dL (ref 13.0–17.0)
Immature Granulocytes: 1 %
Lymphocytes Relative: 19 %
Lymphs Abs: 2.4 10*3/uL (ref 0.7–4.0)
MCH: 31.4 pg (ref 26.0–34.0)
MCHC: 32.6 g/dL (ref 30.0–36.0)
MCV: 96.3 fL (ref 80.0–100.0)
Monocytes Absolute: 1.4 10*3/uL — ABNORMAL HIGH (ref 0.1–1.0)
Monocytes Relative: 12 %
Neutro Abs: 8.1 10*3/uL — ABNORMAL HIGH (ref 1.7–7.7)
Neutrophils Relative %: 65 %
Platelet Count: 424 10*3/uL — ABNORMAL HIGH (ref 150–400)
RBC: 4.62 MIL/uL (ref 4.22–5.81)
RDW: 15 % (ref 11.5–15.5)
WBC Count: 12.3 10*3/uL — ABNORMAL HIGH (ref 4.0–10.5)
nRBC: 0 % (ref 0.0–0.2)

## 2018-10-21 LAB — CEA (IN HOUSE-CHCC): CEA (CHCC-In House): 5.06 ng/mL — ABNORMAL HIGH (ref 0.00–5.00)

## 2018-10-21 MED ORDER — ALPRAZOLAM 0.25 MG PO TABS: 0.2500 mg | ORAL_TABLET | Freq: Two times a day (BID) | ORAL | 0 refills | Status: DC | PRN

## 2018-10-21 MED ORDER — HEPARIN SOD (PORK) LOCK FLUSH 100 UNIT/ML IV SOLN
500.0000 [IU] | Freq: Once | INTRAVENOUS | Status: AC
  Administered 2018-10-21: 500 [IU]
  Filled 2018-10-21: qty 5

## 2018-10-21 MED ORDER — SODIUM CHLORIDE 0.9% FLUSH
10.0000 mL | Freq: Once | INTRAVENOUS | Status: AC
  Administered 2018-10-21: 10 mL
  Filled 2018-10-21: qty 10

## 2018-10-21 NOTE — Patient Instructions (Signed)
[employer], Adult A central line is a thin, flexible tube (catheter) that is put in your vein. It can be used to:  Give you medicine.  Give you food and nutrients. Follow these instructions at home: Caring for the tube   Follow instructions from your doctor about: ? Flushing the tube with saline solution. ? Cleaning the tube and the area around it.  Only flush with clean (sterile) supplies. The supplies should be from your doctor, a pharmacy, or another place that your doctor recommends.  Before you flush the tube or clean the area around the tube: ? Wash your hands with soap and water. If you cannot use soap and water, use hand sanitizer. ? Clean the central line hub with rubbing alcohol. Caring for your skin  Keep the area where the tube was put in clean and dry.  Every day, and when changing the bandage, check the skin around the central line for: ? Redness, swelling, or pain. ? Fluid or blood. ? Warmth. ? Pus. ? A bad smell. General instructions  Keep the tube clamped, unless it is being used.  Keep your supplies in a clean, dry location.  If you or someone else accidentally pulls on the tube, make sure: ? The bandage (dressing) is okay. ? There is no bleeding. ? The tube has not been pulled out.  Do not use scissors or sharp objects near the tube.  Do not swim or let the tube soak in a tub.  Ask your doctor what activities are safe for you. Your doctor may tell you not to lift anything or move your arm too much.  Take over-the-counter and prescription medicines only as told by your doctor.  Change bandages as told by your doctor.  Keep your bandage dry. If a bandage gets wet, have it changed right away.  Keep all follow-up visits as told by your doctor. This is important. Throwing away supplies  Throw away any syringes in a trash (disposal) container that is only for sharp items (sharps container). You can buy a sharps container from a pharmacy, or you  can make one by using an empty hard plastic bottle with a cover.  Place any used bandages or infusion bags into a plastic bag. Throw that bag in the trash. Contact a doctor if:  You have any of these where the tube was put in: ? Redness, swelling, or pain. ? Fluid or blood. ? A warm feeling. ? Pus or a bad smell. Get help right away if:  You have: ? A fever. ? Chills. ? Trouble getting enough air (shortness of breath). ? Trouble breathing. ? Pain in your chest. ? Swelling in your neck, face, chest, or arm.  You are coughing.  You feel your heart beating fast or skipping beats.  You feel dizzy or you pass out (faint).  There are red lines coming from where the tube was put in.  The area where the tube was put in is bleeding and the bleeding will not stop.  Your tube is hard to flush.  You do not get a blood return from the tube.  The tube gets loose or comes out.  The tube has a hole or a tear.  The tube leaks. Summary  A central line is a thin, flexible tube (catheter) that is put in your vein. It can be used to take blood for lab tests or to give you medicine.  Follow instructions from your doctor about flushing and cleaning the  tube.  Keep the area where the tube was put in clean and dry.  Ask your doctor what activities are safe for you. This information is not intended to replace advice given to you by your health care provider. Make sure you discuss any questions you have with your health care provider. Document Released: 04/23/2012 Document Revised: 05/24/2016 Document Reviewed: 05/24/2016 Elsevier Interactive Patient Education  2019 [employer].

## 2018-10-21 NOTE — Telephone Encounter (Signed)
Scheduled appt per 6/2 los.  Called patient and was not able to reach patient.  Calendar will be mailed out.

## 2018-10-21 NOTE — Progress Notes (Signed)
[facility] OFFICE PROGRESS NOTE   Diagnosis: Appendiceal carcinoma  INTERVAL HISTORY:   Mr. Aubry reports a good appetite.  He has gained weight.  He has dyspepsia when he swallows "phlegm".  He is anxious about the COVID pandemic and current protest/riots.  Xanax has helped his anxiety in the past. He is unable to go to [facility] on 11/07/2018 secondary to lack of transportation.  He request having the restaging CTs here.  He has noted a red area at the lateral left toes.  He continues to have a burning discomfort in the lateral right thigh.  He continues smoking.  Objective:  Vital signs in last 24 hours:  Blood pressure 121/79, pulse 90, temperature 98.4 F (36.9 C), temperature source Oral, resp. rate 18, height 5' 10"  (1.778 m), weight 114 lb 3.2 oz (51.8 kg), SpO2 100 %.     Resp: Scattered end inspiratory rhonchi, no respiratory distress Cardio: Regular rate and rhythm GI: Soft and nontender, no mass, no apparent ascites, no hepatosplenomegaly Vascular: No leg edema  Skin: Erythema with flaking of skin at the base of the left fourth and fifth toes and between the toes  Portacath/PICC-without erythema  Lab Results:  Lab Results  Component Value Date   WBC 12.3 (H) 10/21/2018   HGB 14.5 10/21/2018   HCT 44.5 10/21/2018   MCV 96.3 10/21/2018   PLT 424 (H) 10/21/2018   NEUTROABS 8.1 (H) 10/21/2018    CMP  Lab Results  Component Value Date   NA 140 05/02/2018   K 3.6 05/02/2018   CL 108 05/02/2018   CO2 23 05/02/2018   GLUCOSE 109 (H) 05/02/2018   BUN 6 05/02/2018   CREATININE 0.71 05/02/2018   CALCIUM 9.1 05/02/2018   PROT 6.7 05/02/2018   ALBUMIN 3.3 (L) 05/02/2018   AST 26 05/02/2018   ALT 33 05/02/2018   ALKPHOS 105 05/02/2018   BILITOT 0.5 05/02/2018   GFRNONAA >60 05/02/2018   GFRAA >60 05/02/2018    Lab Results  Component Value Date   CEA1 3.47 05/02/2018   Medications: I have reviewed the patient's current medications.    Assessment/Plan:  1. Metastatic adenocarcinoma of the appendix ? 10/02/2017 biopsy of an appendiceal orifice "polyp"revealed invasive adenocarcinoma ? CT abdomen/pelvis 09/26/2017 with evidence of carcinomatosis-omental caking and ascites ? Colonoscopy 10/02/2017 with multiple polyps-tubular adenomas ? Paracentesis 09/24/2017, 09/28/2017-no malignant cells identified ? Paracentesis 11/01/2017-malignant cells consistent with carcinoma ? Biopsy of left peritoneal mass 11/01/2017-poorly differentiated carcinoma ? CT chest 11/01/2017-? Small pleural nodules versus atelectasis, no other evidence of metastatic disease in the chest ? Cycle 1 FOLFOX 11/04/2017 ? Cycle 2 FOLFOX 11/18/2017 ? Cycle 3 FOLFOX 12/02/2017 ? Cycle 4 FOLFOX 12/17/2017 ? Cycle 5 FOLFOX 12/30/2017 ? Cycle 6 FOLFOX 01/14/2018 ? [facility] at [facility] 01/15/2018- improved abdominal ascites.  Similar peritoneal metastatic burden with more discrete measurable peritoneal disease adjacent to the splenic flexure of the colon.  Findings of diffuse bowel serosal metastatic involvement. ? 02/24/2018- laparotomy with right diaphragm peritonectomy, splenectomy, right colectomy, sigmoid colectomy, hyperthermic intraperitoneal chemotherapy mitomycin-C, stent placement; R1 resection (invasive adenocarcinoma, poorly differentiated, arising from the appendix; carcinoma invades to the wall of the appendix, into the surrounding adipose tissue and directly invades into the wall of the terminal ileum.  Perineural invasion present.  Total of 22 benign lymph nodes.  Proximal distal margins free of tumor.  Metastatic adenocarcinoma to the omentum.  Benign spleen.  Left gutter stripping metastatic adenocarcinoma; metastatic adenocarcinoma to the peritoneum;  right diaphragm stripping metastatic adenocarcinoma; debrided tumor metastatic adenocarcinoma to the peritoneum; round ligament of liver metastatic adenocarcinoma).  2.Abdominal pain and bloating secondary to  #1-improved  3.Port-A-Cath placement 11/01/2017  4.Nausea following cycle 1 FOLFOX-Emend and Decadron prophylaxis added with cycle 2    Disposition: Mr. Peak appears well.  He has gained weight.  There is no clinical evidence of disease progression.  He is scheduled for restaging CTs and an office visit with Dr. Clovis Riley in approximately 3 weeks.  He is unable to go to [facility] secondary to lack of transportation.  We will schedule him for restaging CTs and an office visit here.  We checked a chemistry panel and CEA today.  I prescribed low-dose Xanax to use as needed for anxiety.  He will return for office visit after the CT scans.  Betsy Coder, MD  10/21/2018  9:23 AM

## 2018-11-06 ENCOUNTER — Telehealth: Payer: Self-pay | Admitting: *Deleted

## 2018-11-06 NOTE — Telephone Encounter (Signed)
Notified patient that CT was cancelled because his insurance company has not approved it. It is under medical review. Assured him we will follow up on this every day and if MD needs to do a peer-to-peer he will. Will cancel his appointment on 11/10/18 as well since there is no CT scan to review. He asks if he needs to return to [facility] to proceed with scan. Suggested he give the review process a chance. Going back to [facility] will just delay things more and reminded him that transportation was an issue for him there. He agrees to give it a few days.

## 2018-11-07 ENCOUNTER — Ambulatory Visit (HOSPITAL_COMMUNITY): Admission: RE | Admit: 2018-11-07 | Payer: Medicaid Other | Source: Ambulatory Visit

## 2018-11-10 ENCOUNTER — Ambulatory Visit: Payer: Medicaid Other | Admitting: Nurse Practitioner

## 2018-11-17 ENCOUNTER — Telehealth: Payer: Self-pay | Admitting: *Deleted

## 2018-11-17 NOTE — Telephone Encounter (Signed)
NP called insurance company regarding peer-to-peer review for CT scan C/A/P that was ordered. Determined that the reason for denial is that scan was already ordered and approved to be done at [facility]. Notified patient that he can call [facility] to get scan done there or call and request they remove the appointment and order w/authorization for it to be done here. He will think about it and call back in a couple days.

## 2018-11-20 ENCOUNTER — Telehealth: Payer: Self-pay | Admitting: *Deleted

## 2018-11-20 NOTE — Telephone Encounter (Signed)
Called to inform nurse that he wants to have CT scan done in [place]. Has no reliable transportation go get to [facility] [place] [phone]) and spoke with Vikki Ports who spoke with referral coordinator, Blain Pais. She will call [employer] today and withdraw the authorization request for scans at [facility] so they can be authorized and done in [place]. Have not been able to obtain authorization here due to still being in [place] system as being done at [facility]. Message sent to Darlena in managed care to F/U on this.

## 2018-11-25 ENCOUNTER — Telehealth: Payer: Self-pay | Admitting: *Deleted

## 2018-11-25 NOTE — Telephone Encounter (Signed)
Notified by Darlena in managed care that [facility] still has scan PA through [place] and left VM with details of conversation on 11/20/18 with [facility] and that Blain Pais was to take care of removing the request for scan and PA at [facility] w/[facility]. Provided my direct # to call when this is accomplished. Noted that patient is distressed that this is taking so long to be accomplished.

## 2018-11-27 ENCOUNTER — Telehealth: Payer: Self-pay | Admitting: *Deleted

## 2018-11-27 NOTE — Telephone Encounter (Addendum)
Provided patient with CT appointment at 1215/1230 at [facility] on 12/04/18. NPO 4 hours prior and oral contrast at 1030 and 1130. He prefers the water based contrast due to significant diarrhea w/other contrast. Instructed him he needs to arrive at 1015 to drink this contrast. Confirmed his address and notified transportation coordinator of his appointment. Transportation service will only provide ride to [employer]. Moved scan to 7/17 at 1:30 to arrive at 1130 to drink contrast and then see NP at 2:45 that day. Patient is aware and agrees to the change.

## 2018-12-04 ENCOUNTER — Ambulatory Visit (HOSPITAL_COMMUNITY): Payer: Medicaid Other

## 2018-12-05 ENCOUNTER — Encounter (HOSPITAL_COMMUNITY): Payer: Self-pay

## 2018-12-05 ENCOUNTER — Inpatient Hospital Stay: Payer: Medicaid Other | Attending: Oncology | Admitting: Nurse Practitioner

## 2018-12-05 ENCOUNTER — Other Ambulatory Visit: Payer: Self-pay

## 2018-12-05 ENCOUNTER — Encounter: Payer: Self-pay | Admitting: Nurse Practitioner

## 2018-12-05 ENCOUNTER — Ambulatory Visit (HOSPITAL_COMMUNITY)
Admission: RE | Admit: 2018-12-05 | Discharge: 2018-12-05 | Disposition: A | Discharge status: Home / Self Care | Payer: Medicaid Other | Source: Ambulatory Visit | Attending: Oncology | Admitting: Oncology

## 2018-12-05 VITALS — BP 91/64 | HR 80 | Temp 98.5°F | Resp 18 | Ht 70.0 in | Wt 113.2 lb

## 2018-12-05 DIAGNOSIS — C787 Secondary malignant neoplasm of liver and intrahepatic bile duct: Secondary | ICD-10-CM

## 2018-12-05 DIAGNOSIS — R634 Abnormal weight loss: Secondary | ICD-10-CM | POA: Insufficient documentation

## 2018-12-05 DIAGNOSIS — C786 Secondary malignant neoplasm of retroperitoneum and peritoneum: Secondary | ICD-10-CM | POA: Diagnosis not present

## 2018-12-05 DIAGNOSIS — F419 Anxiety disorder, unspecified: Secondary | ICD-10-CM

## 2018-12-05 DIAGNOSIS — J90 Pleural effusion, not elsewhere classified: Secondary | ICD-10-CM | POA: Insufficient documentation

## 2018-12-05 DIAGNOSIS — C181 Malignant neoplasm of appendix: Secondary | ICD-10-CM | POA: Diagnosis not present

## 2018-12-05 DIAGNOSIS — R109 Unspecified abdominal pain: Secondary | ICD-10-CM | POA: Insufficient documentation

## 2018-12-05 DIAGNOSIS — R1900 Intra-abdominal and pelvic swelling, mass and lump, unspecified site: Secondary | ICD-10-CM

## 2018-12-05 MED ORDER — SODIUM CHLORIDE (PF) 0.9 % IJ SOLN
INTRAMUSCULAR | Status: AC
  Filled 2018-12-05: qty 50

## 2018-12-05 MED ORDER — HEPARIN SOD (PORK) LOCK FLUSH 100 UNIT/ML IV SOLN
INTRAVENOUS | Status: AC
  Filled 2018-12-05: qty 5

## 2018-12-05 MED ORDER — HEPARIN SOD (PORK) LOCK FLUSH 100 UNIT/ML IV SOLN
500.0000 [IU] | Freq: Once | INTRAVENOUS | Status: AC
  Administered 2018-12-05: 500 [IU] via INTRAVENOUS

## 2018-12-05 MED ORDER — IOHEXOL 300 MG/ML  SOLN
100.0000 mL | Freq: Once | INTRAMUSCULAR | Status: AC | PRN
  Administered 2018-12-05: 100 mL via INTRAVENOUS

## 2018-12-05 MED ORDER — ALPRAZOLAM 0.5 MG PO TABS: 0.5000 mg | ORAL_TABLET | Freq: Two times a day (BID) | ORAL | 0 refills | Status: DC | PRN

## 2018-12-05 NOTE — Progress Notes (Addendum)
[place] OFFICE PROGRESS NOTE   Diagnosis: Appendiceal carcinoma  INTERVAL HISTORY:   Mr. Giroux returns for follow-up.  He reports a "high anxiety level".  He is taking Xanax 4 times a day.  Overall good appetite.  He notes "gas" with eating and sometimes has rectal pain with a bowel movement.  No nausea or vomiting.  Bowels are moving regularly.  No shortness of breath.  Objective:  Vital signs in last 24 hours:  Blood pressure 91/64, pulse 80, temperature 98.5 F (36.9 C), temperature source Temporal, resp. rate 18, height 5' 10"  (1.778 m), weight 113 lb 3.2 oz (51.3 kg), SpO2 99 %.    GI: Abdomen is mildly distended.  No hepatomegaly. Vascular: No leg edema. Neuro: Alert and oriented. Port-A-Cath without erythema.   Lab Results:  Lab Results  Component Value Date   WBC 12.3 (H) 10/21/2018   HGB 14.5 10/21/2018   HCT 44.5 10/21/2018   MCV 96.3 10/21/2018   PLT 424 (H) 10/21/2018   NEUTROABS 8.1 (H) 10/21/2018    Imaging:  Ct Chest W Contrast  Result Date: 12/05/2018 CLINICAL DATA:  History of appendiceal cancer. EXAM: CT CHEST, ABDOMEN, AND PELVIS WITH CONTRAST TECHNIQUE: Multidetector CT imaging of the chest, abdomen and pelvis was performed following the standard protocol during bolus administration of intravenous contrast. CONTRAST:  179m OMNIPAQUE IOHEXOL 300 MG/ML  SOLN COMPARISON:  CT scan 09/26/2017 FINDINGS: CT CHEST FINDINGS Cardiovascular: The heart is normal in size. No pericardial effusion. The aorta is normal in caliber. No dissection. The branch vessels are patent. The pulmonary arteries appear normal. Mediastinum/Nodes: No mediastinal or hilar mass or lymphadenopathy. Small scattered lymph nodes are stable. The esophagus is grossly normal. Lungs/Pleura: There is a moderate-sized right pleural effusion and a small left pleural effusion. Fairly extensive nodularity noted along the major fissures bilaterally and also along the right minor  fissure highly suspicious for diffuse pleural disease and likely accounting for the patient's effusions. There also appears to be involvement of the medial pleura. No worrisome pulmonary lesions. Musculoskeletal: No chest wall mass, supraclavicular or axillary adenopathy. The thyroid gland appears normal. No bone lesions. CT ABDOMEN PELVIS FINDINGS Hepatobiliary: No focal hepatic lesions or intrahepatic biliary dilatation. No obvious peritoneal surface disease. The portal and hepatic veins are patent. The gallbladder is unremarkable. No common bile duct dilatation. Pancreas: No mass, inflammation or ductal dilatation. Spleen: Status post splenectomy. Adrenals/Urinary Tract: Adrenal glands and kidneys are unremarkable. The bladder is grossly normal. Stomach/Bowel: The stomach,, duodenum, small bowel and colon are unremarkable. No acute inflammatory changes, mass lesions or obstructive findings. Stable surgical changes from a right colectomy and partial sigmoid colectomy. Vascular/Lymphatic: Age advanced atherosclerotic calcifications involving the aorta and iliac arteries but no aneurysm. The major venous structures are patent. Stable scattered retroperitoneal lymph nodes. No recurrent omental disease is demonstrated. There is a difficult to measure infiltrating soft tissue mass involving the sigmoid colon this appears to measure at least 5.3 x 4.9 cm. Difficult to measure on the prior study but it has definitely progressed. This is just below the anastomosis. No enlarged pelvic lymph nodes. No inguinal adenopathy. Reproductive: The prostate gland and seminal vesicles are unremarkable. Other: No free pelvic fluid collections. No inguinal mass and no abdominal wall hernia. No abdominal/pelvic ascites. Musculoskeletal: No significant bony findings. IMPRESSION: 1. New bilateral pleural effusions, moderate on the right and small on the left. There appears to be extensive nodularity along the pleural fissures on both  sides and also  along the medial pleura on the right. Findings most consistent with diffuse pleural disease. 2. No recurrent omental disease. 3. Progressive appearing ill-defined infiltrating soft tissue mass in the pelvis involving the rectosigmoid junction just below the anastomosis. Electronically Signed   By: Marijo Sanes M.D.   On: 12/05/2018 15:17   Ct Abdomen Pelvis W Contrast  Result Date: 12/05/2018 CLINICAL DATA:  History of appendiceal cancer. EXAM: CT CHEST, ABDOMEN, AND PELVIS WITH CONTRAST TECHNIQUE: Multidetector CT imaging of the chest, abdomen and pelvis was performed following the standard protocol during bolus administration of intravenous contrast. CONTRAST:  110m OMNIPAQUE IOHEXOL 300 MG/ML  SOLN COMPARISON:  CT scan 09/26/2017 FINDINGS: CT CHEST FINDINGS Cardiovascular: The heart is normal in size. No pericardial effusion. The aorta is normal in caliber. No dissection. The branch vessels are patent. The pulmonary arteries appear normal. Mediastinum/Nodes: No mediastinal or hilar mass or lymphadenopathy. Small scattered lymph nodes are stable. The esophagus is grossly normal. Lungs/Pleura: There is a moderate-sized right pleural effusion and a small left pleural effusion. Fairly extensive nodularity noted along the major fissures bilaterally and also along the right minor fissure highly suspicious for diffuse pleural disease and likely accounting for the patient's effusions. There also appears to be involvement of the medial pleura. No worrisome pulmonary lesions. Musculoskeletal: No chest wall mass, supraclavicular or axillary adenopathy. The thyroid gland appears normal. No bone lesions. CT ABDOMEN PELVIS FINDINGS Hepatobiliary: No focal hepatic lesions or intrahepatic biliary dilatation. No obvious peritoneal surface disease. The portal and hepatic veins are patent. The gallbladder is unremarkable. No common bile duct dilatation. Pancreas: No mass, inflammation or ductal dilatation.  Spleen: Status post splenectomy. Adrenals/Urinary Tract: Adrenal glands and kidneys are unremarkable. The bladder is grossly normal. Stomach/Bowel: The stomach,, duodenum, small bowel and colon are unremarkable. No acute inflammatory changes, mass lesions or obstructive findings. Stable surgical changes from a right colectomy and partial sigmoid colectomy. Vascular/Lymphatic: Age advanced atherosclerotic calcifications involving the aorta and iliac arteries but no aneurysm. The major venous structures are patent. Stable scattered retroperitoneal lymph nodes. No recurrent omental disease is demonstrated. There is a difficult to measure infiltrating soft tissue mass involving the sigmoid colon this appears to measure at least 5.3 x 4.9 cm. Difficult to measure on the prior study but it has definitely progressed. This is just below the anastomosis. No enlarged pelvic lymph nodes. No inguinal adenopathy. Reproductive: The prostate gland and seminal vesicles are unremarkable. Other: No free pelvic fluid collections. No inguinal mass and no abdominal wall hernia. No abdominal/pelvic ascites. Musculoskeletal: No significant bony findings. IMPRESSION: 1. New bilateral pleural effusions, moderate on the right and small on the left. There appears to be extensive nodularity along the pleural fissures on both sides and also along the medial pleura on the right. Findings most consistent with diffuse pleural disease. 2. No recurrent omental disease. 3. Progressive appearing ill-defined infiltrating soft tissue mass in the pelvis involving the rectosigmoid junction just below the anastomosis. Electronically Signed   By: PMarijo SanesM.D.   On: 12/05/2018 15:17    Medications: I have reviewed the patient's current medications.  Assessment/Plan: 1. Metastatic adenocarcinoma of the appendix ? 10/02/2017 biopsy of an appendiceal orifice "polyp"revealed invasive adenocarcinoma ? CT abdomen/pelvis 09/26/2017 with evidence of  carcinomatosis-omental caking and ascites ? Colonoscopy 10/02/2017 with multiple polyps-tubular adenomas ? Paracentesis 09/24/2017, 09/28/2017-no malignant cells identified ? Paracentesis 11/01/2017-malignant cells consistent with carcinoma ? Biopsy of left peritoneal mass 11/01/2017-poorly differentiated carcinoma ? CT chest 11/01/2017-? Small pleural nodules  versus atelectasis, no other evidence of metastatic disease in the chest ? Cycle 1 FOLFOX 11/04/2017 ? Cycle 2 FOLFOX 11/18/2017 ? Cycle 3 FOLFOX 12/02/2017 ? Cycle 4 FOLFOX 12/17/2017 ? Cycle 5 FOLFOX 12/30/2017 ? Cycle 6 FOLFOX 01/14/2018 ? [facility] CT[facility] at [facility] 01/15/2018- improved abdominal ascites. Similar peritoneal metastatic burden with more discrete measurable peritoneal disease adjacent to the splenic flexure of the colon. Findings of diffuse bowel serosal metastatic involvement. ? 02/24/2018- laparotomy with right diaphragm peritonectomy, splenectomy, right colectomy, sigmoid colectomy, hyperthermic intraperitoneal chemotherapy mitomycin-C, stent placement;R1 resection(invasive adenocarcinoma, poorly differentiated, arising from the appendix; carcinoma invades to the wall of the appendix, into the surrounding adipose tissue and directly invades into the wall of the terminal ileum. Perineural invasion present. Total of 22 benign lymph nodes. Proximal distal margins free of tumor. Metastatic adenocarcinoma to the omentum. Benign spleen. Left gutter stripping metastatic adenocarcinoma; metastatic adenocarcinoma to the peritoneum;right diaphragm stripping metastatic adenocarcinoma; debrided tumor metastatic adenocarcinoma to the peritoneum;round ligament of liver metastatic adenocarcinoma). ? CTs 12/05/2018- new bilateral pleural effusions, moderate on the right and small on the left.  Extensive nodularity along the pleural fissures on both sides and also along the medial pleura on the right.  No recurrent omental disease.  Progressive  appearing ill defined infiltrating soft tissue mass in the pelvis along the rectosigmoid junction just below the anastomosis.  2.Abdominal pain and bloating secondary to #1-improved  3.Port-A-Cath placement 11/01/2017  4.Nausea following cycle 1 FOLFOX-Emend and Decadron prophylaxis added with cycle 2    Disposition: Mr. Novosel appears stable.  He continues to have a good performance status.  He underwent restaging CTs earlier today.  We reviewed those results with him at today's visit to include new bilateral pleural effusions, nodularity along the pleural fissures and medial pleura on the right and a progressive infiltrating soft tissue mass in the pelvis.  We are referring him for a thoracentesis with fluid to be sent for cytology.  We will request foundation 1 testing on the tissue from his surgery October 2019.  He will return for additional discussion in approximately 2 weeks.  Patient seen with Dr. Benay Spice.  CT images reviewed on the computer with Mr. Spangler.  25 minutes were spent face-to-face at today's visit with the majority of that time involved in counseling/coordination of care.    Ned Card ANP/GNP-BC   12/05/2018  4:08 PM  This was a shared visit with Ned Card.  We reviewed the CT images with Mr. Kai.  He understands the pleural findings and pelvic mass are likely related to progression of the appendiceal carcinoma.  We will request molecular testing on the pathology from [facility].  He will be referred for a diagnostic/therapeutic right thoracentesis.  Mr. Pollack will return for an office visit and discussion of treatment options in approximately 2 weeks. Julieanne Manson, MD

## 2018-12-08 ENCOUNTER — Telehealth: Payer: Self-pay

## 2018-12-08 ENCOUNTER — Encounter: Payer: Self-pay | Admitting: *Deleted

## 2018-12-08 ENCOUNTER — Telehealth: Payer: Self-pay | Admitting: Nurse Practitioner

## 2018-12-08 NOTE — Progress Notes (Addendum)
Dr. Benay Spice requesting Foundation One testing on [facility] tissue from case [id number]. Called [facility] and was informed that Dr. Georgina Snell PA, [place] will send email to the [facility] coordinator with request. Received call from Bertha with [facility]. She will coordinate sending his sample out for testing. She will call patient to obtain consent and assess for need for financial assistance w/testing. 12/11/18: Per Bartholomew Crews at [facility]: tissue left today for Foundation One.

## 2018-12-08 NOTE — Telephone Encounter (Signed)
Called and spoke with patient. Confirmed date and time of appt

## 2018-12-12 ENCOUNTER — Encounter: Payer: Self-pay | Admitting: General Practice

## 2018-12-12 ENCOUNTER — Telehealth: Payer: Self-pay | Admitting: *Deleted

## 2018-12-12 ENCOUNTER — Encounter: Payer: Self-pay | Admitting: *Deleted

## 2018-12-12 NOTE — Telephone Encounter (Signed)
Called patient with appointments for COVID test and thoracentesis. He requested RN call back and leave all the information on his voice mail. He then verbalized his frustration with his medical, financial and living situation. Says he sometimes feels like giving up and wants to get out of [place], but feels trapped. Called back and left following information: COVID test on 12/16/18 at 12:10 at [facility] at [address] (there is a bus stop near there) outside under the awning. Thoracentesis at [facility] radiology on 12/17/18 at 10:30 am and he is to arrive at 10:15. OK to eat/drink prior to procedure. Requested he call back if he has any questions or issues with these appointments. He will need to take public transportation-bus due to no friends/family available to drive him. Left message for CSW to see if he would qualify for any transportation help since his condition is declining.

## 2018-12-12 NOTE — Progress Notes (Signed)
[place] CSW Progress Notes  Patient has no appropriate transportation for preprocedure COVID testing and procedure.  He must maintain strict quarantine between test administration and surgery, therefore he cannot ride [facility], [place] transport, Medicaid transport, public transport.  He states he has no family or friends available to transport him. He can maintain strict isolation post test administration and pre procedure as follows:  Tues 7/27 - COVID test  - pt rides city bus to test - [facility] ambulance transports back to home after test is administered in order to maintain isolation  Weds 7/28  - [facility] ambulance transports from home to [facility] for thoracentesis  - patient rides bus home as isolation is no longer an issue post procedure.  [place] RN has the physician certification of medical necessity form - when signed by Dr Benay Spice,  needs to submit one form for each transport to Isabelle Course at [place].  Paula's fax is on the form [phone]).  Call [facility] Monday afternoon to make sure arrangements are in place for ride home post test and ride to procedure on Weds.  [facility] # is [phone].  When patient is finished w test, either [facility] test site staff or [facility] staff can notify [facility] that patient is ready for transport home after test.    Edwyna Shell, LCSW Clinical Social Worker Phone:  [phone]

## 2018-12-15 ENCOUNTER — Telehealth: Payer: Self-pay | Admitting: *Deleted

## 2018-12-15 NOTE — Telephone Encounter (Signed)
Faxed Physician Certification Statement and made arrangements for his transport from [facility] to [place] on 7/28 and transport from motel to [facility] on 7/29 with [facility] [phone] with Liliane Channel. Notified patient of his transport home on 7/28 and transport to [facility] on 7/29 due to necessity to keep him isolated due to COVID restrictions on his thoracentesis procedure. He is not allowed to ride bus after COVID test until after the procedure on Wednesday. He understands and agrees.

## 2018-12-16 ENCOUNTER — Telehealth: Payer: Self-pay | Admitting: *Deleted

## 2018-12-16 ENCOUNTER — Other Ambulatory Visit (HOSPITAL_COMMUNITY)
Admission: RE | Admit: 2018-12-16 | Discharge: 2018-12-16 | Disposition: A | Discharge status: Home / Self Care | Payer: Medicaid Other | Source: Ambulatory Visit | Attending: Nurse Practitioner | Admitting: Nurse Practitioner

## 2018-12-16 DIAGNOSIS — Z20828 Contact with and (suspected) exposure to other viral communicable diseases: Secondary | ICD-10-CM | POA: Insufficient documentation

## 2018-12-16 LAB — SARS CORONAVIRUS 2 (TAT 6-24 HRS): SARS Coronavirus 2: NEGATIVE

## 2018-12-16 NOTE — Telephone Encounter (Signed)
Patient called to report he has a friend that is letting him use his car today to drive for his testing and return home. Will still need [facility] tomorrow am. [facility] and cancelled transport for today, but confirmed need for tomorrow.

## 2018-12-17 ENCOUNTER — Ambulatory Visit (HOSPITAL_COMMUNITY)
Admission: RE | Admit: 2018-12-17 | Discharge: 2018-12-17 | Disposition: A | Discharge status: Home / Self Care | Payer: Medicaid Other | Source: Ambulatory Visit | Attending: Nurse Practitioner | Admitting: Nurse Practitioner

## 2018-12-17 ENCOUNTER — Ambulatory Visit (HOSPITAL_COMMUNITY)
Admission: RE | Admit: 2018-12-17 | Discharge: 2018-12-17 | Disposition: A | Discharge status: Home / Self Care | Payer: Medicaid Other | Source: Ambulatory Visit | Attending: Student | Admitting: Student

## 2018-12-17 ENCOUNTER — Other Ambulatory Visit: Payer: Self-pay

## 2018-12-17 DIAGNOSIS — Z9889 Other specified postprocedural states: Secondary | ICD-10-CM

## 2018-12-17 DIAGNOSIS — J90 Pleural effusion, not elsewhere classified: Secondary | ICD-10-CM | POA: Insufficient documentation

## 2018-12-17 DIAGNOSIS — C181 Malignant neoplasm of appendix: Secondary | ICD-10-CM | POA: Diagnosis not present

## 2018-12-17 NOTE — Procedures (Signed)
PROCEDURE SUMMARY:  Successful US guided diagnostic and therapeutic right thoracentesis. Yielded 1.0 liters of yellow fluid. Pt tolerated procedure well. No immediate complications.  Specimen was sent for labs. CXR ordered.  EBL < 5 mL  Docia Barrier PA-C 12/17/2018 11:05 AM

## 2018-12-19 ENCOUNTER — Encounter: Payer: Self-pay | Admitting: Nurse Practitioner

## 2018-12-19 ENCOUNTER — Other Ambulatory Visit: Payer: Self-pay

## 2018-12-19 ENCOUNTER — Inpatient Hospital Stay (HOSPITAL_BASED_OUTPATIENT_CLINIC_OR_DEPARTMENT_OTHER): Payer: Medicaid Other | Admitting: Nurse Practitioner

## 2018-12-19 VITALS — BP 105/76 | HR 92 | Temp 98.7°F | Wt 106.7 lb

## 2018-12-19 DIAGNOSIS — R109 Unspecified abdominal pain: Secondary | ICD-10-CM

## 2018-12-19 DIAGNOSIS — C181 Malignant neoplasm of appendix: Secondary | ICD-10-CM

## 2018-12-19 DIAGNOSIS — R1900 Intra-abdominal and pelvic swelling, mass and lump, unspecified site: Secondary | ICD-10-CM

## 2018-12-19 DIAGNOSIS — J90 Pleural effusion, not elsewhere classified: Secondary | ICD-10-CM | POA: Diagnosis not present

## 2018-12-19 DIAGNOSIS — C786 Secondary malignant neoplasm of retroperitoneum and peritoneum: Secondary | ICD-10-CM | POA: Diagnosis not present

## 2018-12-19 DIAGNOSIS — F419 Anxiety disorder, unspecified: Secondary | ICD-10-CM

## 2018-12-19 DIAGNOSIS — C787 Secondary malignant neoplasm of liver and intrahepatic bile duct: Secondary | ICD-10-CM

## 2018-12-19 DIAGNOSIS — R634 Abnormal weight loss: Secondary | ICD-10-CM

## 2018-12-19 NOTE — Progress Notes (Addendum)
[place] OFFICE PROGRESS NOTE   Diagnosis: Appendiceal carcinoma  INTERVAL HISTORY:   [name] returns as scheduled.  He reports appetite is poor.  He is losing weight.  He has pain with bowel movements.  No bleeding.  No nausea or vomiting.  He becomes short of breath when he is "upset".  Anxiety is controlled with Xanax.  Objective:  Vital signs in last 24 hours:  Blood pressure 105/76, pulse 92, temperature 98.7 F (37.1 C), weight 106 lb 11.2 oz (48.4 kg), SpO2 98 %.   Limited physical examination due to COVID-19 distancing. Vascular: No leg edema. Neuro: Alert and oriented. Port-A-Cath without erythema.  Lab Results:  Lab Results  Component Value Date   WBC 12.3 (H) 10/21/2018   HGB 14.5 10/21/2018   HCT 44.5 10/21/2018   MCV 96.3 10/21/2018   PLT 424 (H) 10/21/2018   NEUTROABS 8.1 (H) 10/21/2018    Imaging:  No results found.  Medications: I have reviewed the patient's current medications.  Assessment/Plan: 1. Metastatic adenocarcinoma of the appendix ? 10/02/2017 biopsy of an appendiceal orifice "polyp"revealed invasive adenocarcinoma ? CT abdomen/pelvis 09/26/2017 with evidence of carcinomatosis-omental caking and ascites ? Colonoscopy 10/02/2017 with multiple polyps-tubular adenomas ? Paracentesis 09/24/2017, 09/28/2017-no malignant cells identified ? Paracentesis 11/01/2017-malignant cells consistent with carcinoma ? Biopsy of left peritoneal mass 11/01/2017-poorly differentiated carcinoma ? CT chest 11/01/2017-? Small pleural nodules versus atelectasis, no other evidence of metastatic disease in the chest ? Cycle 1 FOLFOX 11/04/2017 ? Cycle 2 FOLFOX 11/18/2017 ? Cycle 3 FOLFOX 12/02/2017 ? Cycle 4 FOLFOX 12/17/2017 ? Cycle 5 FOLFOX 12/30/2017 ? Cycle 6 FOLFOX 01/14/2018 ? [facility]aging CTs at [facility] 01/15/2018-improved abdominal ascites. Similar peritoneal metastatic burden with more discrete measurable peritoneal disease adjacent to the  splenic flexure of the colon. Findings of diffuse bowel serosal metastatic involvement. ? 02/24/2018-laparotomy with right diaphragm peritonectomy, splenectomy, right colectomy, sigmoid colectomy, hyperthermic intraperitoneal chemotherapy mitomycin-C, stent placement;R1 resection(invasive adenocarcinoma, poorly differentiated, arising from the appendix; carcinoma invades to the wall of the appendix, into the surrounding adipose tissue and directly invades into the wall of the terminal ileum. Perineural invasion present. Total of 22 benign lymph nodes. Proximal distal margins free of tumor. Metastatic adenocarcinoma to the omentum. Benign spleen. Left gutter stripping metastatic adenocarcinoma; metastatic adenocarcinoma to the peritoneum;right diaphragm stripping metastatic adenocarcinoma; debrided tumor metastatic adenocarcinoma to the peritoneum;round ligament of liver metastatic adenocarcinoma). ? CTs 12/05/2018- new bilateral pleural effusions, moderate on the right and small on the left.  Extensive nodularity along the pleural fissures on both sides and also along the medial pleura on the right.  No recurrent omental disease.  Progressive appearing ill defined infiltrating soft tissue mass in the pelvis along the rectosigmoid junction just below the anastomosis. ? Thoracentesis 12/17/2018- reactive mesothelial cells and acute inflammation  2.Abdominal pain and bloating secondary to #1-improved  3.Port-A-Cath placement 11/01/2017  4.Nausea following cycle 1 FOLFOX-Emend and Decadron prophylaxis added with cycle 2   Disposition: [name] appears unchanged.  The pleural fluid was negative for malignant cells.  We reviewed this result with [name] at today's visit.  He understands we remain concerned the pleural effusion is malignant.  He would like to proceed with a biopsy to confirm recurrent cancer prior to beginning systemic chemotherapy.  We will refer to interventional  radiology for a biopsy.  In anticipation of beginning chemotherapy we discussed the FOLFIRI regimen.  We reviewed potential toxicities associated with Irinotecan including bone marrow toxicity, hair loss, diarrhea (both acute  and delayed), nausea.  We reviewed potential toxicities associated with 5-fluorouracil including mouth sores, diarrhea, hand-foot syndrome, rash, increased sensitivity to sun.  He will return for lab, follow-up, FOLFIRI 01/01/2019.  He will undergo a biopsy in the interim.  Foundation 1 results are pending.  Patient seen with Dr. Benay Spice.  CT images were reviewed on the computer with Mr. Marucci.  25 minutes were spent face-to-face at today's visit with the majority of that time involved in counseling/coordination of care.    Ned Card ANP/GNP-BC   12/19/2018  3:15 PM This was a shared visit with Ned Card.  The thoracentesis was nondiagnostic.  We discussed the likelihood of progressive appendiceal carcinoma with [name].  We discussed treatment options.  I recommend a diagnostic biopsy and then FOLFIRI if metastatic carcinoma is confirmed.  I reviewed the case with interventional radiology.  They do not see a lesion amenable to percutaneous biopsy.  I will make referral to gastroenterology to consider a sigmoidoscopy.  We reviewed potential toxicities associated with the FOLFIRI regimen.  [name] agrees to proceed.  Julieanne Manson, MD

## 2018-12-19 NOTE — Progress Notes (Signed)
DISCONTINUE OFF PATHWAY REGIMEN - [Other Dx]   [id number] (q14d):   A cycle is every 14 days:     Oxaliplatin      Leucovorin      5-Fluorouracil      5-Fluorouracil   **Always confirm dose/schedule in your pharmacy ordering system**  REASON: Disease Progression PRIOR TREATMENT: FOLFOX (q14d) TREATMENT RESPONSE: Unable to Evaluate  START OFF PATHWAY REGIMEN - Other   [id number]:FOLFIRI (q14d):   A cycle is every 14 days:     Irinotecan      Leucovorin      Fluorouracil      Fluorouracil   **Always confirm dose/schedule in your pharmacy ordering system**  Patient Characteristics: Intent of Therapy: Non-Curative / Palliative Intent, Discussed with Patient

## 2018-12-22 ENCOUNTER — Telehealth: Payer: Self-pay

## 2018-12-22 DIAGNOSIS — C181 Malignant neoplasm of appendix: Secondary | ICD-10-CM

## 2018-12-22 NOTE — Telephone Encounter (Signed)
I have scheduled this pt for Flex on 8/7 at 3 pm in the [place].  I called the pt to go over the information as well as instructions.  He was very frustrated and said he had to much going on in his life and could not remember what I was saying.  I asked him if he had something to write with and I would be happy to wait for him to write everything down.  He tells me he has no paper or pencil.  I tried to explain he would need to purchase a fleet enema and 8 oz bottle of mag citrate and he tells me he has no money and is living in a hotel and that I woke him up and that is the way it always happens.  As soon as he goes to sleep someone calls and wakes him up.  I also tried to tell him he would need a care partner and he says " I have no one in my life"  He asked me to call back and leave all the information on his voice mail.  I did as asked and left a detailed message.  I will forward to [facility] and Dr Ardis Hughs for review.  Dawn there was a message that you are familiar with the pt and may be able to help with transportation.  He will also need a care partner.  Let me know if I can do anything to help.

## 2018-12-22 NOTE — Telephone Encounter (Signed)
Dawn, [place];  Are you guys able to help with this.  He definitely cannot have a procedure at our surgery center without a care partner.  The hospitals are more lenient about that rule but he still needs to complete a mini-prep to have an adequate examination.  I suppose he could be admitted for overnight observation only, prepped appropriately and then have a flex sig the next morning.  The only days that works for me are Thursdays and the first one possible at this point is Thursday August 20th.  The hospitalists would need to be involved for the admission.    Any other thoughts?

## 2018-12-22 NOTE — Telephone Encounter (Signed)
-----   Message from Milus Banister, MD sent at 12/20/2018  7:47 AM EDT ----- Leta Speller get him in.  Thanks  Lily Velasquez, He needs sigmoidoscopy in the next 5-7 days hopefully.  Diagnosis appendiceal cancer.  Sounds like transportation may be an issue for him.  He'll still need a care partner to come with per policy at [facility].   Thanks  ----- Message ----- From: Ladell Pier, MD Sent: 12/19/2018   6:24 PM EDT To: Milus Banister, MD  This is a young man with a history of appendiceal carcinoma, status post cytoreductive surgery and intraperitoneal chemotherapy October 2019.He now has a right pleural effusion-cytology nondiagnostic, and appears to have a mass involving the sigmoid colon.  Can you or a colleague get him in for a diagnostic sigmoidoscopy  The plan is to begin salvage FOLFIRI if he is confirmed to have recurrent disease  Dawn knows him well and may be able to help with transportation to a procedure  Thanks,  [employer]

## 2018-12-23 ENCOUNTER — Telehealth: Payer: Self-pay | Admitting: Oncology

## 2018-12-23 NOTE — Telephone Encounter (Signed)
Called and spoke with patient. Confirmed date and time of apt

## 2018-12-25 ENCOUNTER — Telehealth: Payer: Self-pay | Admitting: Gastroenterology

## 2018-12-25 NOTE — Telephone Encounter (Signed)
The pt called and we discussed the prep instructions for the flex tomorrow.  He had a chance to ask any questions.  He was advised to call with any further concerns.

## 2018-12-25 NOTE — Progress Notes (Signed)
Spoke with Derek Lloyd at [facility] [phone]) to f/u on status of [employer] testing sent on 12/11/18. She reports FO said based on tissue submitted there is a high probability it will fail testing. Can proceed w/testing if Dr. Benay Spice wishes. Currently [facility] pathologist is looking to see if there is another block that will yield better cellularity.  Per Dr. Benay Spice: Send off if there is a better sample, otherwise cancel the test. Can do [employer] [employer] here if necessary. [facility] did fax PDL1 score: TPS = 1.0

## 2018-12-25 NOTE — Telephone Encounter (Signed)
Pt is scheduled for flex Sig and would like to discuss prep.

## 2018-12-25 NOTE — Telephone Encounter (Signed)
I spoke to the pt today and he is planning on keeping appt for flex tomorrow.  All of his questions were answered.

## 2018-12-25 NOTE — Telephone Encounter (Signed)
Spoke with patient regarding Covid-19 screening questions. Covid-19 Screening Questions:  Do you now or have you had a fever in the last 14 days? no  Do you have any respiratory symptoms of shortness of breath or  cough now or in the last 14 days? no  Do you have any family members or close contacts with diagnosed or suspected Covid-19 in the past 14 days? no  Have you been tested for Covid-19 and found to be positive?  Yes, Negative  Pt made aware of that care partner may wait in the car or come up to the lobby during the procedure but will need to provide their own mask.

## 2018-12-26 ENCOUNTER — Telehealth: Payer: Self-pay | Admitting: Nurse Practitioner

## 2018-12-26 ENCOUNTER — Encounter: Payer: Self-pay | Admitting: Gastroenterology

## 2018-12-26 ENCOUNTER — Ambulatory Visit (AMBULATORY_SURGERY_CENTER): Payer: Medicaid Other | Admitting: Gastroenterology

## 2018-12-26 ENCOUNTER — Other Ambulatory Visit: Payer: Self-pay

## 2018-12-26 VITALS — BP 89/54 | HR 83 | Temp 98.3°F | Resp 31 | Ht 71.0 in | Wt 113.0 lb

## 2018-12-26 DIAGNOSIS — C181 Malignant neoplasm of appendix: Secondary | ICD-10-CM

## 2018-12-26 MED ORDER — SODIUM CHLORIDE 0.9 % IV SOLN: 500.0000 mL | Freq: Once | INTRAVENOUS | Status: DC

## 2018-12-26 NOTE — Patient Instructions (Signed)
YOU HAD AN ENDOSCOPIC PROCEDURE TODAY AT [facility]:   Refer to the procedure report that was given to you for any specific questions about what was found during the examination.  If the procedure report does not answer your questions, please call your gastroenterologist to clarify.  If you requested that your care partner not be given the details of your procedure findings, then the procedure report has been included in a sealed envelope for you to review at your convenience later.  YOU SHOULD EXPECT: Some feelings of bloating in the abdomen. Passage of more gas than usual.  Walking can help get rid of the air that was put into your GI tract during the procedure and reduce the bloating. If you had a lower endoscopy (such as a colonoscopy or flexible sigmoidoscopy) you may notice spotting of blood in your stool or on the toilet paper. If you underwent a bowel prep for your procedure, you may not have a normal bowel movement for a few days.  Please Note:  You might notice some irritation and congestion in your nose or some drainage.  This is from the oxygen used during your procedure.  There is no need for concern and it should clear up in a day or so.  SYMPTOMS TO REPORT IMMEDIATELY:   Following lower endoscopy (colonoscopy or flexible sigmoidoscopy):  Excessive amounts of blood in the stool  Significant tenderness or worsening of abdominal pains  Swelling of the abdomen that is new, acute  Fever of 100F or higher   For urgent or emergent issues, a gastroenterologist can be reached at any hour by calling [phone].   DIET:  We do recommend a small meal at first, but then you may proceed to your regular diet.  Drink plenty of fluids but you should avoid alcoholic beverages for 24 hours.  ACTIVITY:  You should plan to take it easy for the rest of today and you should NOT DRIVE or use heavy machinery until tomorrow (because of the sedation medicines used during the test).     FOLLOW UP: Our staff will call the number listed on your records 48-72 hours following your procedure to check on you and address any questions or concerns that you may have regarding the information given to you following your procedure. If we do not reach you, we will leave a message.  We will attempt to reach you two times.  During this call, we will ask if you have developed any symptoms of COVID 19. If you develop any symptoms (ie: fever, flu-like symptoms, shortness of breath, cough etc.) before then, please call [phone].  If you test positive for Covid 19 in the 2 weeks post procedure, please call and report this information to [place].    If any biopsies were taken you will be contacted by phone or by letter within the next 1-3 weeks.  Please call us at [phone] if you have not heard about the biopsies in 3 weeks.    SIGNATURES/CONFIDENTIALITY: You and/or your care partner have signed paperwork which will be entered into your electronic medical record.  These signatures attest to the fact that that the information above on your After Visit Summary has been reviewed and is understood.  Full responsibility of the confidentiality of this discharge information lies with you and/or your care-partner.

## 2018-12-26 NOTE — Op Note (Signed)
[facility] Patient Name: Derek Lloyd Procedure Date: 12/26/2018 2:16 PM MRN: [id number] Endoscopist: Milus Banister , MD Age: 48 Referring MD:  Date of Birth: [birth year] Gender: Male Account #: [id number] Procedure:                Flexible Sigmoidoscopy Indications:              known metastatic appendicial carcinoma (s/o right                            hemicolectomy, sigmoid resection and more 2019                            surgery). Now with mass in pelvis involving                            residual left colon. Medicines:                Monitored Anesthesia Care Procedure:                Pre-Anesthesia Assessment:                           - Prior to the procedure, a History and Physical                            was performed, and patient medications and                            allergies were reviewed. The patient's tolerance of                            previous anesthesia was also reviewed. The risks                            and benefits of the procedure and the sedation                            options and risks were discussed with the patient.                            All questions were answered, and informed consent                            was obtained. Prior Anticoagulants: The patient has                            taken no previous anticoagulant or antiplatelet                            agents. ASA Grade Assessment: II - A patient with                            mild systemic disease. After reviewing the risks  and benefits, the patient was deemed in                            satisfactory condition to undergo the procedure.                           After obtaining informed consent, the scope was                            passed under direct vision. The Colonoscope was                            introduced through the anus and advanced to the the                            splenic flexure. The flexible  sigmoidoscopy was                            accomplished without difficulty. The patient                            tolerated the procedure well. The patient tolerated                            the procedure well. The quality of the bowel                            preparation was good. Scope In: Scope Out: Findings:                 Very tortuous and anatomically abnormal left colon                            without any luminal masses, tumors.                           Sigmoid anastomosis noted about 20cm from the anus,                            associated with significant mucosal edema but not                            obvious tumors. Complications:            No immediate complications. Estimated blood loss:                            None. Estimated Blood Loss:     Estimated blood loss: none. Impression:               Very tortuous and anatomically abnormal left colon                            without any luminal masses, tumors.  Sigmoid anastomosis noted about 20cm from the anus,                            associated with significant mucosal edema but not                            obvious tumors.                           I suspect the pelvic mass lesion noted on the                            recent CT is due to extraluminal disease. Recommendation:           - Discharge patient to home.                           - Follow up with Dr. Suan Halter, MD 12/26/2018 2:52:03 PM This report has been signed electronically.

## 2018-12-26 NOTE — Progress Notes (Signed)
Report to PACU, RN, vss, BBS= Clear.  

## 2018-12-26 NOTE — Telephone Encounter (Signed)
r/s appt per 8/03 sch message - left message for patient with new appt date and time . Sent message to transportation to let the know

## 2018-12-26 NOTE — Progress Notes (Signed)
Temp-[facility] [place]

## 2018-12-28 ENCOUNTER — Other Ambulatory Visit: Payer: Self-pay | Admitting: Oncology

## 2018-12-29 ENCOUNTER — Encounter: Payer: Self-pay | Admitting: *Deleted

## 2018-12-29 ENCOUNTER — Telehealth: Payer: Self-pay | Admitting: *Deleted

## 2018-12-29 NOTE — Telephone Encounter (Signed)
Patient asking for CSW to call him to direct him towards how he can obtain legal advice (free if possible) in how to handle his living situation at the hotel he is staying in. He is concerned about the cleanliness of the room and what his rights are or how he can obtain alternate living arrangements till he can locate another place to live. Has already paid for the month of August where he is currently living. Forwarded request to [place] department as he requested.

## 2018-12-29 NOTE — Progress Notes (Signed)
[name]  Holiday representative received referral from [employer].   Patient reported issues at the hotel he is currently staying in.  CSW contacted patient by phone to offer support.  Patient reported hotel room was not up to acceptable cleanliness standards, including bed bugs.  Patient stated he spoke to the front office staff at the hotel and the issue is "being taken care of".  Patient stated they have replaced the bed and are having the carpets cleaned.  CSW encouraged patient to call if he has additional needs or concerns.   [name], MSW, LCSW, OSW-C Clinical Social Worker [facility] [phone]

## 2018-12-30 ENCOUNTER — Telehealth: Payer: Self-pay

## 2018-12-30 NOTE — Telephone Encounter (Signed)
  Follow up Call-  Call back number 12/26/2018  Post procedure Call Back phone  # [phone]  Permission to leave phone message Yes  Some recent data might be hidden     Patient questions:  Do you have a fever, pain , or abdominal swelling? No. Pain Score  0 *  Have you tolerated food without any problems? Yes.    Have you been able to return to your normal activities? Yes.    Do you have any questions about your discharge instructions: Diet   No. Medications  No. Follow up visit  No.  Do you have questions or concerns about your Care? Patient very upset that I woke him up at 7:37.  He does have pain, but it is the same pain he was having prior to flex sig. He is seeing his doctor tomorrow.  Actions: * If pain score is 4 or above: No action needed, pain <4. 1. Have you developed a fever since your procedure? no  2.   Have you had an respiratory symptoms (SOB or cough) since your procedure? no  3.   Have you tested positive for COVID 19 since your procedure no  4.   Have you had any family members/close contacts diagnosed with the COVID 19 since your procedure?  no   If yes to any of these questions please route to Joylene John, RN and Alphonsa Gin, Therapist, sports.

## 2018-12-31 ENCOUNTER — Inpatient Hospital Stay: Payer: Medicaid Other | Attending: Oncology

## 2018-12-31 ENCOUNTER — Encounter: Payer: Self-pay | Admitting: Nurse Practitioner

## 2018-12-31 ENCOUNTER — Inpatient Hospital Stay (HOSPITAL_BASED_OUTPATIENT_CLINIC_OR_DEPARTMENT_OTHER): Payer: Medicaid Other | Admitting: Nurse Practitioner

## 2018-12-31 ENCOUNTER — Inpatient Hospital Stay (HOSPITAL_BASED_OUTPATIENT_CLINIC_OR_DEPARTMENT_OTHER): Payer: Medicaid Other | Admitting: Medical

## 2018-12-31 ENCOUNTER — Inpatient Hospital Stay: Payer: Medicaid Other

## 2018-12-31 ENCOUNTER — Other Ambulatory Visit: Payer: Self-pay | Admitting: Medical

## 2018-12-31 ENCOUNTER — Other Ambulatory Visit: Payer: Self-pay

## 2018-12-31 ENCOUNTER — Other Ambulatory Visit: Payer: Self-pay | Admitting: *Deleted

## 2018-12-31 VITALS — BP 105/65 | HR 69 | Resp 18

## 2018-12-31 VITALS — BP 91/62 | HR 73 | Temp 97.8°F | Resp 18 | Ht 71.0 in | Wt 107.1 lb

## 2018-12-31 DIAGNOSIS — Z9081 Acquired absence of spleen: Secondary | ICD-10-CM | POA: Diagnosis not present

## 2018-12-31 DIAGNOSIS — R188 Other ascites: Secondary | ICD-10-CM | POA: Diagnosis not present

## 2018-12-31 DIAGNOSIS — Z79899 Other long term (current) drug therapy: Secondary | ICD-10-CM | POA: Diagnosis not present

## 2018-12-31 DIAGNOSIS — Z5111 Encounter for antineoplastic chemotherapy: Secondary | ICD-10-CM | POA: Diagnosis not present

## 2018-12-31 DIAGNOSIS — R05 Cough: Secondary | ICD-10-CM | POA: Insufficient documentation

## 2018-12-31 DIAGNOSIS — C181 Malignant neoplasm of appendix: Secondary | ICD-10-CM

## 2018-12-31 DIAGNOSIS — C799 Secondary malignant neoplasm of unspecified site: Secondary | ICD-10-CM

## 2018-12-31 DIAGNOSIS — R109 Unspecified abdominal pain: Secondary | ICD-10-CM | POA: Diagnosis not present

## 2018-12-31 DIAGNOSIS — J90 Pleural effusion, not elsewhere classified: Secondary | ICD-10-CM | POA: Diagnosis not present

## 2018-12-31 DIAGNOSIS — K219 Gastro-esophageal reflux disease without esophagitis: Secondary | ICD-10-CM

## 2018-12-31 DIAGNOSIS — R110 Nausea: Secondary | ICD-10-CM | POA: Insufficient documentation

## 2018-12-31 DIAGNOSIS — C787 Secondary malignant neoplasm of liver and intrahepatic bile duct: Secondary | ICD-10-CM | POA: Insufficient documentation

## 2018-12-31 DIAGNOSIS — C786 Secondary malignant neoplasm of retroperitoneum and peritoneum: Secondary | ICD-10-CM | POA: Insufficient documentation

## 2018-12-31 DIAGNOSIS — Z95828 Presence of other vascular implants and grafts: Secondary | ICD-10-CM

## 2018-12-31 LAB — CBC WITH DIFFERENTIAL (CANCER CENTER ONLY)
Abs Immature Granulocytes: 0.03 10*3/uL (ref 0.00–0.07)
Basophils Absolute: 0.1 10*3/uL (ref 0.0–0.1)
Basophils Relative: 1 %
Eosinophils Absolute: 0.2 10*3/uL (ref 0.0–0.5)
Eosinophils Relative: 1 %
HCT: 43.5 % (ref 39.0–52.0)
Hemoglobin: 14.4 g/dL (ref 13.0–17.0)
Immature Granulocytes: 0 %
Lymphocytes Relative: 16 %
Lymphs Abs: 2.2 10*3/uL (ref 0.7–4.0)
MCH: 31.1 pg (ref 26.0–34.0)
MCHC: 33.1 g/dL (ref 30.0–36.0)
MCV: 94 fL (ref 80.0–100.0)
Monocytes Absolute: 1.4 10*3/uL — ABNORMAL HIGH (ref 0.1–1.0)
Monocytes Relative: 10 %
Neutro Abs: 9.6 10*3/uL — ABNORMAL HIGH (ref 1.7–7.7)
Neutrophils Relative %: 72 %
Platelet Count: 509 10*3/uL — ABNORMAL HIGH (ref 150–400)
RBC: 4.63 MIL/uL (ref 4.22–5.81)
RDW: 13.9 % (ref 11.5–15.5)
WBC Count: 13.4 10*3/uL — ABNORMAL HIGH (ref 4.0–10.5)
nRBC: 0 % (ref 0.0–0.2)

## 2018-12-31 LAB — CMP (CANCER CENTER ONLY)
ALT: 27 U/L (ref 0–44)
AST: 28 U/L (ref 15–41)
Albumin: 3.6 g/dL (ref 3.5–5.0)
Alkaline Phosphatase: 118 U/L (ref 38–126)
Anion gap: 11 (ref 5–15)
BUN: 10 mg/dL (ref 6–20)
CO2: 24 mmol/L (ref 22–32)
Calcium: 9.5 mg/dL (ref 8.9–10.3)
Chloride: 104 mmol/L (ref 98–111)
Creatinine: 0.83 mg/dL (ref 0.61–1.24)
GFR, Est AFR Am: >60 mL/min (ref >60)
GFR, Estimated: >60 mL/min (ref >60)
Glucose, Bld: 92 mg/dL (ref 70–99)
Potassium: 4.1 mmol/L (ref 3.5–5.1)
Sodium: 139 mmol/L (ref 135–145)
Total Bilirubin: 0.5 mg/dL (ref 0.3–1.2)
Total Protein: 7.1 g/dL (ref 6.5–8.1)

## 2018-12-31 LAB — CEA (IN HOUSE-CHCC): CEA (CHCC-In House): 4.19 ng/mL (ref 0.00–5.00)

## 2018-12-31 MED ORDER — SODIUM CHLORIDE 0.9 % IV SOLN
2400.0000 mg/m2 | INTRAVENOUS | Status: DC
  Administered 2018-12-31: 3700 mg via INTRAVENOUS
  Filled 2018-12-31: qty 74

## 2018-12-31 MED ORDER — ONDANSETRON HCL 8 MG PO TABS: 8.0000 mg | ORAL_TABLET | Freq: Three times a day (TID) | ORAL | 0 refills | Status: DC | PRN

## 2018-12-31 MED ORDER — SODIUM CHLORIDE 0.9% FLUSH
10.0000 mL | INTRAVENOUS | Status: DC | PRN
  Filled 2018-12-31: qty 10

## 2018-12-31 MED ORDER — FLUOROURACIL CHEMO INJECTION 2.5 GM/50ML
400.0000 mg/m2 | Freq: Once | INTRAVENOUS | Status: AC
  Administered 2018-12-31: 600 mg via INTRAVENOUS
  Filled 2018-12-31: qty 12

## 2018-12-31 MED ORDER — ALUM & MAG HYDROXIDE-SIMETH 200-200-20 MG/5ML PO SUSP
ORAL | Status: AC
  Filled 2018-12-31: qty 30

## 2018-12-31 MED ORDER — ATROPINE SULFATE 0.4 MG/ML IJ SOLN
INTRAMUSCULAR | Status: AC
  Filled 2018-12-31: qty 1

## 2018-12-31 MED ORDER — OMEPRAZOLE 20 MG PO CPDR: 20.0000 mg | DELAYED_RELEASE_CAPSULE | Freq: Every day | ORAL | 5 refills | Status: DC

## 2018-12-31 MED ORDER — LEUCOVORIN CALCIUM INJECTION 350 MG
400.0000 mg/m2 | Freq: Once | INTRAVENOUS | Status: AC
  Administered 2018-12-31: 13:00:00 620 mg via INTRAVENOUS
  Filled 2018-12-31: qty 31

## 2018-12-31 MED ORDER — HEPARIN SOD (PORK) LOCK FLUSH 100 UNIT/ML IV SOLN
500.0000 [IU] | Freq: Once | INTRAVENOUS | Status: DC | PRN
  Filled 2018-12-31: qty 5

## 2018-12-31 MED ORDER — PROCHLORPERAZINE MALEATE 10 MG PO TABS: 10.0000 mg | ORAL_TABLET | Freq: Four times a day (QID) | ORAL | 0 refills | Status: DC | PRN

## 2018-12-31 MED ORDER — LIDOCAINE VISCOUS HCL 2 % MT SOLN
15.0000 mL | Freq: Once | OROMUCOSAL | Status: AC
  Administered 2018-12-31: 15 mL via ORAL
  Filled 2018-12-31: qty 15

## 2018-12-31 MED ORDER — ALPRAZOLAM 0.5 MG PO TABS: 0.5000 mg | ORAL_TABLET | Freq: Two times a day (BID) | ORAL | 0 refills | Status: DC | PRN

## 2018-12-31 MED ORDER — PALONOSETRON HCL INJECTION 0.25 MG/5ML
INTRAVENOUS | Status: AC
  Filled 2018-12-31: qty 5

## 2018-12-31 MED ORDER — SODIUM CHLORIDE 0.9% FLUSH
10.0000 mL | Freq: Once | INTRAVENOUS | Status: AC
  Administered 2018-12-31: 10:00:00 10 mL
  Filled 2018-12-31: qty 10

## 2018-12-31 MED ORDER — IRINOTECAN HCL CHEMO INJECTION 100 MG/5ML
180.0000 mg/m2 | Freq: Once | INTRAVENOUS | Status: AC
  Administered 2018-12-31: 280 mg via INTRAVENOUS
  Filled 2018-12-31: qty 14

## 2018-12-31 MED ORDER — DEXAMETHASONE SODIUM PHOSPHATE 10 MG/ML IJ SOLN
INTRAMUSCULAR | Status: AC
  Filled 2018-12-31: qty 1

## 2018-12-31 MED ORDER — SODIUM CHLORIDE 0.9 % IV SOLN
Freq: Once | INTRAVENOUS | Status: AC
  Administered 2018-12-31: 13:00:00 via INTRAVENOUS
  Filled 2018-12-31: qty 250

## 2018-12-31 MED ORDER — FAMOTIDINE IN NACL 20-0.9 MG/50ML-% IV SOLN
20.0000 mg | Freq: Once | INTRAVENOUS | Status: AC
  Administered 2018-12-31: 20 mg via INTRAVENOUS

## 2018-12-31 MED ORDER — ATROPINE SULFATE 1 MG/ML IJ SOLN
INTRAMUSCULAR | Status: AC
  Filled 2018-12-31: qty 1

## 2018-12-31 MED ORDER — SODIUM CHLORIDE 0.9 % IV SOLN
Freq: Once | INTRAVENOUS | Status: AC
  Administered 2018-12-31: 12:00:00 via INTRAVENOUS
  Filled 2018-12-31: qty 250

## 2018-12-31 MED ORDER — DEXAMETHASONE SODIUM PHOSPHATE 10 MG/ML IJ SOLN
10.0000 mg | Freq: Once | INTRAMUSCULAR | Status: AC
  Administered 2018-12-31: 10 mg via INTRAVENOUS

## 2018-12-31 MED ORDER — ATROPINE SULFATE 1 MG/ML IJ SOLN: 0.5000 mg | Freq: Once | INTRAMUSCULAR | Status: DC | PRN

## 2018-12-31 MED ORDER — LOPERAMIDE HCL 2 MG PO CAPS: 4.0000 mg | ORAL_CAPSULE | ORAL | 0 refills | Status: DC | PRN

## 2018-12-31 MED ORDER — ALUM & MAG HYDROXIDE-SIMETH 200-200-20 MG/5ML PO SUSP
30.0000 mL | Freq: Once | ORAL | Status: AC
  Administered 2018-12-31: 30 mL via ORAL

## 2018-12-31 MED ORDER — PALONOSETRON HCL INJECTION 0.25 MG/5ML
0.2500 mg | Freq: Once | INTRAVENOUS | Status: AC
  Administered 2018-12-31: 0.25 mg via INTRAVENOUS

## 2018-12-31 MED FILL — ANTI-DIARRHEAL 2 MG CAPLET: 2 | 3 days supply | Qty: 24 | Fill #0

## 2018-12-31 MED FILL — ONDANSETRON HCL 8 MG TABLET: 8 | 10 days supply | Qty: 30 | Fill #0

## 2018-12-31 MED FILL — OMEPRAZOLE 20 MG CAP: 20 | 30 days supply | Qty: 30 | Fill #0

## 2018-12-31 MED FILL — PROCHLORPERAZINE 10 MG TAB: 10 | 7 days supply | Qty: 30 | Fill #0

## 2018-12-31 MED FILL — ALPRAZolam 0.5 MG TABS: 0.5 | 30 days supply | Qty: 60 | Fill #0

## 2018-12-31 NOTE — Progress Notes (Signed)
Spoke with patient in treatment area and provided copy of scripts for Zofran, Compazine and Imodium AD that have been called to [facility]. Made him aware that according to managed care he still has a $675 balance on his [employer] fund. Inquired if he would like the alprazolam moved to [facility] as well and he said "yes". This was done for him as well.

## 2018-12-31 NOTE — Patient Instructions (Signed)
[facility] Discharge Instructions for Patients Receiving Chemotherapy  Today you received the following chemotherapy agents leucovorin; irenotecan; 5-FU  To help prevent nausea and vomiting after your treatment, we encourage you to take your nausea medication as directed. If you develop nausea and vomiting that is not controlled by your nausea medication, call the clinic.   BELOW ARE SYMPTOMS THAT SHOULD BE REPORTED IMMEDIATELY:  *FEVER GREATER THAN 100.5 F  *CHILLS WITH OR WITHOUT FEVER  NAUSEA AND VOMITING THAT IS NOT CONTROLLED WITH YOUR NAUSEA MEDICATION  *UNUSUAL SHORTNESS OF BREATH  *UNUSUAL BRUISING OR BLEEDING  TENDERNESS IN MOUTH AND THROAT WITH OR WITHOUT PRESENCE OF ULCERS  *URINARY PROBLEMS  *BOWEL PROBLEMS  UNUSUAL RASH Items with * indicate a potential emergency and should be followed up as soon as possible.  Feel free to call the clinic should you have any questions or concerns. The clinic phone number is [phone].  Please show the [facility] at check-in to the Emergency Department and triage nurse.  Fluorouracil, 5-FU injection What is this medicine? FLUOROURACIL, 5-FU (flure oh YOOR a sil) is a chemotherapy drug. It slows the growth of cancer cells. This medicine is used to treat many types of cancer like breast cancer, colon or rectal cancer, pancreatic cancer, and stomach cancer. This medicine may be used for other purposes; ask your health care provider or pharmacist if you have questions. COMMON BRAND NAME(S): Adrucil What should I tell my health care provider before I take this medicine? They need to know if you have any of these conditions:  blood disorders  dihydropyrimidine dehydrogenase (DPD) deficiency  infection (especially a virus infection such as chickenpox, cold sores, or herpes)  kidney disease  liver disease  malnourished, poor nutrition  recent or ongoing radiation therapy  an unusual or allergic  reaction to fluorouracil, other chemotherapy, other medicines, foods, dyes, or preservatives  pregnant or trying to get pregnant  breast-feeding How should I use this medicine? This drug is given as an infusion or injection into a vein. It is administered in a hospital or clinic by a specially trained health care professional. Talk to your pediatrician regarding the use of this medicine in children. Special care may be needed. Overdosage: If you think you have taken too much of this medicine contact a poison control center or emergency room at once. NOTE: This medicine is only for you. Do not share this medicine with others. What if I miss a dose? It is important not to miss your dose. Call your doctor or health care professional if you are unable to keep an appointment. What may interact with this medicine?  allopurinol  cimetidine  dapsone  digoxin  hydroxyurea  leucovorin  levamisole  medicines for seizures like ethotoin, fosphenytoin, phenytoin  medicines to increase blood counts like filgrastim, pegfilgrastim, sargramostim  medicines that treat or prevent blood clots like warfarin, enoxaparin, and dalteparin  methotrexate  metronidazole  pyrimethamine  some other chemotherapy drugs like busulfan, cisplatin, estramustine, vinblastine  trimethoprim  trimetrexate  vaccines Talk to your doctor or health care professional before taking any of these medicines:  acetaminophen  aspirin  ibuprofen  ketoprofen  naproxen This list may not describe all possible interactions. Give your health care provider a list of all the medicines, herbs, non-prescription drugs, or dietary supplements you use. Also tell them if you smoke, drink alcohol, or use illegal drugs. Some items may interact with your medicine. What should I watch for while using this  medicine? Visit your doctor for checks on your progress. This drug may make you feel generally unwell. This is not  uncommon, as chemotherapy can affect healthy cells as well as cancer cells. Report any side effects. Continue your course of treatment even though you feel ill unless your doctor tells you to stop. In some cases, you may be given additional medicines to help with side effects. Follow all directions for their use. Call your doctor or health care professional for advice if you get a fever, chills or sore throat, or other symptoms of a cold or flu. Do not treat yourself. This drug decreases your body's ability to fight infections. Try to avoid being around people who are sick. This medicine may increase your risk to bruise or bleed. Call your doctor or health care professional if you notice any unusual bleeding. Be careful brushing and flossing your teeth or using a toothpick because you may get an infection or bleed more easily. If you have any dental work done, tell your dentist you are receiving this medicine. Avoid taking products that contain aspirin, acetaminophen, ibuprofen, naproxen, or ketoprofen unless instructed by your doctor. These medicines may hide a fever. Do not become pregnant while taking this medicine. Women should inform their doctor if they wish to become pregnant or think they might be pregnant. There is a potential for serious side effects to an unborn child. Talk to your health care professional or pharmacist for more information. Do not breast-feed an infant while taking this medicine. Men should inform their doctor if they wish to father a child. This medicine may lower sperm counts. Do not treat diarrhea with over the counter products. Contact your doctor if you have diarrhea that lasts more than 2 days or if it is severe and watery. This medicine can make you more sensitive to the sun. Keep out of the sun. If you cannot avoid being in the sun, wear protective clothing and use sunscreen. Do not use sun lamps or tanning beds/booths. What side effects may I notice from receiving this  medicine? Side effects that you should report to your doctor or health care professional as soon as possible:  allergic reactions like skin rash, itching or hives, swelling of the face, lips, or tongue  low blood counts - this medicine may decrease the number of white blood cells, red blood cells and platelets. You may be at increased risk for infections and bleeding.  signs of infection - fever or chills, cough, sore throat, pain or difficulty passing urine  signs of decreased platelets or bleeding - bruising, pinpoint red spots on the skin, black, tarry stools, blood in the urine  signs of decreased red blood cells - unusually weak or tired, fainting spells, lightheadedness  breathing problems  changes in vision  chest pain  mouth sores  nausea and vomiting  pain, swelling, redness at site where injected  pain, tingling, numbness in the hands or feet  redness, swelling, or sores on hands or feet  stomach pain  unusual bleeding Side effects that usually do not require medical attention (report to your doctor or health care professional if they continue or are bothersome):  changes in finger or toe nails  diarrhea  dry or itchy skin  hair loss  headache  loss of appetite  sensitivity of eyes to the light  stomach upset  unusually teary eyes This list may not describe all possible side effects. Call your doctor for medical advice about side effects. You may  report side effects to FDA at 1-800-FDA-1088. Where should I keep my medicine? This drug is given in a hospital or clinic and will not be stored at home. NOTE: This sheet is a summary. It may not cover all possible information. If you have questions about this medicine, talk to your doctor, pharmacist, or health care provider.  2020 Elsevier/Gold Standard (2007-09-10 13:53:16)  Leucovorin injection What is this medicine? LEUCOVORIN (loo koe VOR in) is used to prevent or treat the harmful effects of some  medicines. This medicine is used to treat anemia caused by a low amount of folic acid in the body. It is also used with 5-fluorouracil (5-FU) to treat colon cancer. This medicine may be used for other purposes; ask your health care provider or pharmacist if you have questions. What should I tell my health care provider before I take this medicine? They need to know if you have any of these conditions:  anemia from low levels of vitamin B-12 in the blood  an unusual or allergic reaction to leucovorin, folic acid, other medicines, foods, dyes, or preservatives  pregnant or trying to get pregnant  breast-feeding How should I use this medicine? This medicine is for injection into a muscle or into a vein. It is given by a health care professional in a hospital or clinic setting. Talk to your pediatrician regarding the use of this medicine in children. Special care may be needed. Overdosage: If you think you have taken too much of this medicine contact a poison control center or emergency room at once. NOTE: This medicine is only for you. Do not share this medicine with others. What if I miss a dose? This does not apply. What may interact with this medicine?  capecitabine  fluorouracil  phenobarbital  phenytoin  primidone  trimethoprim-sulfamethoxazole This list may not describe all possible interactions. Give your health care provider a list of all the medicines, herbs, non-prescription drugs, or dietary supplements you use. Also tell them if you smoke, drink alcohol, or use illegal drugs. Some items may interact with your medicine. What should I watch for while using this medicine? Your condition will be monitored carefully while you are receiving this medicine. This medicine may increase the side effects of 5-fluorouracil, 5-FU. Tell your doctor or health care professional if you have diarrhea or mouth sores that do not get better or that get worse. What side effects may I notice  from receiving this medicine? Side effects that you should report to your doctor or health care professional as soon as possible:  allergic reactions like skin rash, itching or hives, swelling of the face, lips, or tongue  breathing problems  fever, infection  mouth sores  unusual bleeding or bruising  unusually weak or tired Side effects that usually do not require medical attention (report to your doctor or health care professional if they continue or are bothersome):  constipation or diarrhea  loss of appetite  nausea, vomiting This list may not describe all possible side effects. Call your doctor for medical advice about side effects. You may report side effects to FDA at 1-800-FDA-1088. Where should I keep my medicine? This drug is given in a hospital or clinic and will not be stored at home. NOTE: This sheet is a summary. It may not cover all possible information. If you have questions about this medicine, talk to your doctor, pharmacist, or health care provider.  2020 Elsevier/Gold Standard (2007-11-11 16:50:29)  Irinotecan injection What is this medicine? IRINOTECAN (ir  in oh TEE kan ) is a chemotherapy drug. It is used to treat colon and rectal cancer. This medicine may be used for other purposes; ask your health care provider or pharmacist if you have questions. COMMON BRAND NAME(S): Camptosar What should I tell my health care provider before I take this medicine? They need to know if you have any of these conditions:  dehydration  diarrhea  infection (especially a virus infection such as chickenpox, cold sores, or herpes)  liver disease  low blood counts, like low white cell, platelet, or red cell counts  low levels of calcium, magnesium, or potassium in the blood  recent or ongoing radiation therapy  an unusual or allergic reaction to irinotecan, other medicines, foods, dyes, or preservatives  pregnant or trying to get pregnant  breast-feeding How  should I use this medicine? This drug is given as an infusion into a vein. It is administered in a hospital or clinic by a specially trained health care professional. Talk to your pediatrician regarding the use of this medicine in children. Special care may be needed. Overdosage: If you think you have taken too much of this medicine contact a poison control center or emergency room at once. NOTE: This medicine is only for you. Do not share this medicine with others. What if I miss a dose? It is important not to miss your dose. Call your doctor or health care professional if you are unable to keep an appointment. What may interact with this medicine? This medicine may interact with the following medications:  antiviral medicines for HIV or AIDS  certain antibiotics like rifampin or rifabutin  certain medicines for fungal infections like itraconazole, ketoconazole, posaconazole, and voriconazole  certain medicines for seizures like carbamazepine, phenobarbital, phenotoin  clarithromycin  gemfibrozil  nefazodone  St. John's Wort This list may not describe all possible interactions. Give your health care provider a list of all the medicines, herbs, non-prescription drugs, or dietary supplements you use. Also tell them if you smoke, drink alcohol, or use illegal drugs. Some items may interact with your medicine. What should I watch for while using this medicine? Your condition will be monitored carefully while you are receiving this medicine. You will need important blood work done while you are taking this medicine. This drug may make you feel generally unwell. This is not uncommon, as chemotherapy can affect healthy cells as well as cancer cells. Report any side effects. Continue your course of treatment even though you feel ill unless your doctor tells you to stop. In some cases, you may be given additional medicines to help with side effects. Follow all directions for their use. You may  get drowsy or dizzy. Do not drive, use machinery, or do anything that needs mental alertness until you know how this medicine affects you. Do not stand or sit up quickly, especially if you are an older patient. This reduces the risk of dizzy or fainting spells. Call your health care professional for advice if you get a fever, chills, or sore throat, or other symptoms of a cold or flu. Do not treat yourself. This medicine decreases your body's ability to fight infections. Try to avoid being around people who are sick. Avoid taking products that contain aspirin, acetaminophen, ibuprofen, naproxen, or ketoprofen unless instructed by your doctor. These medicines may hide a fever. This medicine may increase your risk to bruise or bleed. Call your doctor or health care professional if you notice any unusual bleeding. Be careful brushing and  flossing your teeth or using a toothpick because you may get an infection or bleed more easily. If you have any dental work done, tell your dentist you are receiving this medicine. Do not become pregnant while taking this medicine or for 6 months after stopping it. Women should inform their health care professional if they wish to become pregnant or think they might be pregnant. Men should not father a child while taking this medicine and for 3 months after stopping it. There is potential for serious side effects to an unborn child. Talk to your health care professional for more information. Do not breast-feed an infant while taking this medicine or for 7 days after stopping it. This medicine has caused ovarian failure in some women. This medicine may make it more difficult to get pregnant. Talk to your health care professional if you are concerned about your fertility. This medicine has caused decreased sperm counts in some men. This may make it more difficult to father a child. Talk to your health care professional if you are concerned about your fertility. What side effects  may I notice from receiving this medicine? Side effects that you should report to your doctor or health care professional as soon as possible:  allergic reactions like skin rash, itching or hives, swelling of the face, lips, or tongue  chest pain  diarrhea  flushing, runny nose, sweating during infusion  low blood counts - this medicine may decrease the number of white blood cells, red blood cells and platelets. You may be at increased risk for infections and bleeding.  nausea, vomiting  pain, swelling, warmth in the leg  signs of decreased platelets or bleeding - bruising, pinpoint red spots on the skin, black, tarry stools, blood in the urine  signs of infection - fever or chills, cough, sore throat, pain or difficulty passing urine  signs of decreased red blood cells - unusually weak or tired, fainting spells, lightheadedness Side effects that usually do not require medical attention (report to your doctor or health care professional if they continue or are bothersome):  constipation  hair loss  headache  loss of appetite  mouth sores  stomach pain This list may not describe all possible side effects. Call your doctor for medical advice about side effects. You may report side effects to FDA at 1-800-FDA-1088. Where should I keep my medicine? This drug is given in a hospital or clinic and will not be stored at home. NOTE: This sheet is a summary. It may not cover all possible information. If you have questions about this medicine, talk to your doctor, pharmacist, or health care provider.  2020 Elsevier/Gold Standard (2018-06-27 10:09:17)

## 2018-12-31 NOTE — Progress Notes (Addendum)
[facility] OFFICE PROGRESS NOTE   Diagnosis: Appendiceal carcinoma  INTERVAL HISTORY:   [name] returns as scheduled.  He continues to have abdominal pain after eating and with bowel movements.  No shortness of breath.  No fever.  Occasional cough.  Objective:  Vital signs in last 24 hours:  Blood pressure 91/62, pulse 73, temperature 97.8 F (36.6 C), temperature source Temporal, resp. rate 18, height 5' 11"  (1.803 m), weight 107 lb 1 oz (48.6 kg), SpO2 99 %.    GI: No hepatomegaly.  No apparent ascites.  Tender at the left abdomen. Vascular: No leg edema. Neuro: Alert and oriented. Skin: Palms without erythema. Port-A-Cath without erythema.   Lab Results:  Lab Results  Component Value Date   WBC 13.4 (H) 12/31/2018   HGB 14.4 12/31/2018   HCT 43.5 12/31/2018   MCV 94.0 12/31/2018   PLT 509 (H) 12/31/2018   NEUTROABS 9.6 (H) 12/31/2018    Imaging:  No results found.  Medications: I have reviewed the patient's current medications.  Assessment/Plan: 1. Metastatic adenocarcinoma of the appendix ? 10/02/2017 biopsy of an appendiceal orifice "polyp"revealed invasive adenocarcinoma ? CT abdomen/pelvis 09/26/2017 with evidence of carcinomatosis-omental caking and ascites ? Colonoscopy 10/02/2017 with multiple polyps-tubular adenomas ? Paracentesis 09/24/2017, 09/28/2017-no malignant cells identified ? Paracentesis 11/01/2017-malignant cells consistent with carcinoma ? Biopsy of left peritoneal mass 11/01/2017-poorly differentiated carcinoma ? CT chest 11/01/2017-? Small pleural nodules versus atelectasis, no other evidence of metastatic disease in the chest ? Cycle 1 FOLFOX 11/04/2017 ? Cycle 2 FOLFOX 11/18/2017 ? Cycle 3 FOLFOX 12/02/2017 ? Cycle 4 FOLFOX 12/17/2017 ? Cycle 5 FOLFOX 12/30/2017 ? Cycle 6 FOLFOX 01/14/2018 ? [facility] at [facility] 01/15/2018-improved abdominal ascites. Similar peritoneal metastatic burden with more discrete measurable  peritoneal disease adjacent to the splenic flexure of the colon. Findings of diffuse bowel serosal metastatic involvement. ? 02/24/2018-laparotomy with right diaphragm peritonectomy, splenectomy, right colectomy, sigmoid colectomy, hyperthermic intraperitoneal chemotherapy mitomycin-C, stent placement;R1 resection(invasive adenocarcinoma, poorly differentiated, arising from the appendix; carcinoma invades to the wall of the appendix, into the surrounding adipose tissue and directly invades into the wall of the terminal ileum. Perineural invasion present. Total of 22 benign lymph nodes. Proximal distal margins free of tumor. Metastatic adenocarcinoma to the omentum. Benign spleen. Left gutter stripping metastatic adenocarcinoma; metastatic adenocarcinoma to the peritoneum;right diaphragm stripping metastatic adenocarcinoma; debrided tumor metastatic adenocarcinoma to the peritoneum;round ligament of liver metastatic adenocarcinoma). ? CTs 12/05/2018-new bilateral pleural effusions, moderate on the right and small on the left. Extensive nodularity along the pleural fissures on both sides and also along the medial pleura on the right. No recurrent omental disease. Progressive appearing ill defined infiltrating soft tissue mass in the pelvis along the rectosigmoid junction just below the anastomosis. ? Thoracentesis 12/17/2018- reactive mesothelial cells and acute inflammation ? 12/26/2018 flexible sigmoidoscopy- tortuous and anatomically abnormal left colon without any luminal masses, tumors.  Sigmoid anastomosis noted about 20 cm from the anus associated with significant mucosal edema but not obvious tumors. ? Cycle 1 FOLFIRI 12/31/2018  2.Abdominal pain and bloating secondary to #1-improved  3.Port-A-Cath placement 11/01/2017  4.Nausea following cycle 1 FOLFOX-Emend and Decadron prophylaxis added with cycle 2    Disposition: Derek Lloyd appears unchanged.  He understands the  likelihood of progressive appendiceal carcinoma and agrees to proceed with FOLFIRI.  We again reviewed potential toxicities.  Prescription sent to his pharmacy for Compazine and Zofran.  Plan to proceed with cycle 1 FOLFIRI today as scheduled.  We reviewed the  CBC from today.  Counts adequate to proceed with treatment.  He will return for lab, follow-up, cycle 2 FOLFIRI in 2 weeks.  He will contact the office in the interim with any problems.  Patient seen with Dr. Benay Spice.  Ned Card ANP/GNP-BC   12/31/2018  11:22 AM  This was a shared visit with Ned Card.  Derek Lloyd had no evidence of intrinsic tumor on the sigmoidoscopy.  He would like to proceed with FOLFIRI chemotherapy.  He will complete cycle 1 today. Julieanne Manson, MD

## 2018-12-31 NOTE — Progress Notes (Signed)
At 1340 patient c/o burning sensation in the chest feeling like "acid reflux" he also felt some tightness in the throat. Infusion was stopped and fluids were ran. Vitals remained stable. Sandi Mealy was called. See MAR for medications administered.  The patients symptoms resolved around 1400. The patient had no further complaints.

## 2019-01-01 ENCOUNTER — Ambulatory Visit: Payer: Medicaid Other

## 2019-01-01 ENCOUNTER — Ambulatory Visit: Payer: Medicaid Other | Admitting: Nurse Practitioner

## 2019-01-01 ENCOUNTER — Telehealth: Payer: Self-pay | Admitting: Nurse Practitioner

## 2019-01-01 ENCOUNTER — Other Ambulatory Visit: Payer: Medicaid Other

## 2019-01-01 NOTE — Progress Notes (Signed)
Derek Lloyd is a 48 year old with appendiceal carcinoma who is managed by Dr. Benay Spice. He was seen in infusion as he was receiving cycle #1 of FOLFIRI. He developed esophageal burning and pressure. He was given a GI cocktail with improvement of his symptoms. Additionally, he was given a prescription for Prilosec 20 mg once daily.  Sandi Mealy, MHS, PA-C Physician Assistant

## 2019-01-01 NOTE — Telephone Encounter (Signed)
Called and spoke with patient. Confirmed 8/27 appt

## 2019-01-02 ENCOUNTER — Inpatient Hospital Stay (HOSPITAL_COMMUNITY)
Admission: EM | Admit: 2019-01-02 | Discharge: 2019-01-03 | DRG: 287 | Disposition: A | Discharge status: Home / Self Care | Payer: Medicaid Other | Attending: Cardiovascular Disease | Admitting: Cardiovascular Disease

## 2019-01-02 ENCOUNTER — Inpatient Hospital Stay (HOSPITAL_COMMUNITY)
Admission: EM | Disposition: A | Discharge status: Home / Self Care | Payer: Self-pay | Source: Home / Self Care | Attending: Cardiovascular Disease

## 2019-01-02 ENCOUNTER — Encounter (HOSPITAL_COMMUNITY): Payer: Self-pay | Admitting: Cardiovascular Disease

## 2019-01-02 ENCOUNTER — Other Ambulatory Visit: Payer: Self-pay

## 2019-01-02 ENCOUNTER — Inpatient Hospital Stay: Payer: Medicaid Other

## 2019-01-02 ENCOUNTER — Inpatient Hospital Stay (HOSPITAL_COMMUNITY): Payer: Medicaid Other

## 2019-01-02 ENCOUNTER — Ambulatory Visit (HOSPITAL_COMMUNITY): Admit: 2019-01-02 | Payer: Medicaid Other | Admitting: Cardiovascular Disease

## 2019-01-02 DIAGNOSIS — Z8042 Family history of malignant neoplasm of prostate: Secondary | ICD-10-CM | POA: Diagnosis not present

## 2019-01-02 DIAGNOSIS — F17210 Nicotine dependence, cigarettes, uncomplicated: Secondary | ICD-10-CM | POA: Diagnosis present

## 2019-01-02 DIAGNOSIS — I471 Supraventricular tachycardia, unspecified: Secondary | ICD-10-CM | POA: Diagnosis present

## 2019-01-02 DIAGNOSIS — C189 Malignant neoplasm of colon, unspecified: Secondary | ICD-10-CM | POA: Diagnosis present

## 2019-01-02 DIAGNOSIS — I959 Hypotension, unspecified: Secondary | ICD-10-CM | POA: Diagnosis present

## 2019-01-02 DIAGNOSIS — Z20828 Contact with and (suspected) exposure to other viral communicable diseases: Secondary | ICD-10-CM | POA: Diagnosis present

## 2019-01-02 DIAGNOSIS — I200 Unstable angina: Secondary | ICD-10-CM | POA: Diagnosis present

## 2019-01-02 DIAGNOSIS — Z9081 Acquired absence of spleen: Secondary | ICD-10-CM | POA: Diagnosis not present

## 2019-01-02 DIAGNOSIS — C181 Malignant neoplasm of appendix: Secondary | ICD-10-CM | POA: Diagnosis present

## 2019-01-02 DIAGNOSIS — F411 Generalized anxiety disorder: Secondary | ICD-10-CM | POA: Diagnosis present

## 2019-01-02 DIAGNOSIS — Z79899 Other long term (current) drug therapy: Secondary | ICD-10-CM

## 2019-01-02 DIAGNOSIS — C7989 Secondary malignant neoplasm of other specified sites: Secondary | ICD-10-CM | POA: Diagnosis present

## 2019-01-02 DIAGNOSIS — F1290 Cannabis use, unspecified, uncomplicated: Secondary | ICD-10-CM | POA: Diagnosis present

## 2019-01-02 DIAGNOSIS — I420 Dilated cardiomyopathy: Secondary | ICD-10-CM | POA: Diagnosis not present

## 2019-01-02 DIAGNOSIS — I428 Other cardiomyopathies: Secondary | ICD-10-CM | POA: Diagnosis present

## 2019-01-02 DIAGNOSIS — R079 Chest pain, unspecified: Secondary | ICD-10-CM

## 2019-01-02 DIAGNOSIS — K56609 Unspecified intestinal obstruction, unspecified as to partial versus complete obstruction: Secondary | ICD-10-CM | POA: Diagnosis present

## 2019-01-02 DIAGNOSIS — Z888 Allergy status to other drugs, medicaments and biological substances status: Secondary | ICD-10-CM

## 2019-01-02 HISTORY — PX: LEFT HEART CATH AND CORONARY ANGIOGRAPHY: CATH118249

## 2019-01-02 LAB — BASIC METABOLIC PANEL
Anion gap: 13 (ref 5–15)
BUN: 13 mg/dL (ref 6–20)
CO2: 21 mmol/L — ABNORMAL LOW (ref 22–32)
Calcium: 9.6 mg/dL (ref 8.9–10.3)
Chloride: 106 mmol/L (ref 98–111)
Creatinine, Ser: 0.83 mg/dL (ref 0.61–1.24)
GFR calc Af Amer: >60 mL/min (ref >60)
GFR calc non Af Amer: >60 mL/min (ref >60)
Glucose, Bld: 111 mg/dL — ABNORMAL HIGH (ref 70–99)
Potassium: 4.3 mmol/L (ref 3.5–5.1)
Sodium: 140 mmol/L (ref 135–145)

## 2019-01-02 LAB — LIPID PANEL
Cholesterol: 178 mg/dL (ref 0–200)
HDL: 53 mg/dL (ref >40)
LDL Cholesterol: 108 mg/dL — ABNORMAL HIGH (ref 0–99)
Total CHOL/HDL Ratio: 3.4 RATIO
Triglycerides: 83 mg/dL (ref <150)
VLDL: 17 mg/dL (ref 0–40)

## 2019-01-02 LAB — ECHOCARDIOGRAM COMPLETE

## 2019-01-02 LAB — CBC
HCT: 52.7 % — ABNORMAL HIGH (ref 39.0–52.0)
Hemoglobin: 17.4 g/dL — ABNORMAL HIGH (ref 13.0–17.0)
MCH: 31.8 pg (ref 26.0–34.0)
MCHC: 33 g/dL (ref 30.0–36.0)
MCV: 96.3 fL (ref 80.0–100.0)
Platelets: 577 10*3/uL — ABNORMAL HIGH (ref 150–400)
RBC: 5.47 MIL/uL (ref 4.22–5.81)
RDW: 14 % (ref 11.5–15.5)
WBC: 13 10*3/uL — ABNORMAL HIGH (ref 4.0–10.5)
nRBC: 0 % (ref 0.0–0.2)

## 2019-01-02 LAB — SARS CORONAVIRUS 2 BY RT PCR (HOSPITAL ORDER, PERFORMED IN ~~LOC~~ HOSPITAL LAB): SARS Coronavirus 2: NEGATIVE

## 2019-01-02 LAB — TROPONIN I (HIGH SENSITIVITY)
Troponin I (High Sensitivity): 27 ng/L — ABNORMAL HIGH (ref <18)
Troponin I (High Sensitivity): 62 ng/L — ABNORMAL HIGH (ref <18)
Troponin I (High Sensitivity): 76 ng/L — ABNORMAL HIGH (ref <18)

## 2019-01-02 LAB — HEMOGLOBIN A1C
Hgb A1c MFr Bld: 5.5 % (ref 4.8–5.6)
Mean Plasma Glucose: 111.15 mg/dL

## 2019-01-02 LAB — MRSA PCR SCREENING: MRSA by PCR: NEGATIVE

## 2019-01-02 SURGERY — LEFT HEART CATH AND CORONARY ANGIOGRAPHY
Anesthesia: LOCAL

## 2019-01-02 MED ORDER — METOPROLOL TARTRATE 5 MG/5ML IV SOLN
INTRAVENOUS | Status: AC
  Filled 2019-01-02: qty 5

## 2019-01-02 MED ORDER — FENTANYL CITRATE (PF) 100 MCG/2ML IJ SOLN
INTRAMUSCULAR | Status: AC
  Filled 2019-01-02: qty 2

## 2019-01-02 MED ORDER — ONDANSETRON HCL 4 MG/2ML IJ SOLN: 4.0000 mg | Freq: Four times a day (QID) | INTRAMUSCULAR | Status: DC | PRN

## 2019-01-02 MED ORDER — HEPARIN SOD (PORK) LOCK FLUSH 100 UNIT/ML IV SOLN
500.0000 [IU] | INTRAVENOUS | Status: DC
  Filled 2019-01-02: qty 5

## 2019-01-02 MED ORDER — NOREPINEPHRINE BITARTRATE 1 MG/ML IV SOLN
INTRAVENOUS | Status: AC | PRN
  Administered 2019-01-02: 10 ug/min via INTRAVENOUS

## 2019-01-02 MED ORDER — LIDOCAINE HCL (PF) 1 % IJ SOLN
INTRAMUSCULAR | Status: DC | PRN
  Administered 2019-01-02: 2 mL via SUBCUTANEOUS

## 2019-01-02 MED ORDER — HEPARIN SODIUM (PORCINE) 1000 UNIT/ML IJ SOLN
INTRAMUSCULAR | Status: DC | PRN
  Administered 2019-01-02: 3000 [IU] via INTRAVENOUS

## 2019-01-02 MED ORDER — NOREPINEPHRINE 4 MG/250ML-% IV SOLN
INTRAVENOUS | Status: AC
  Filled 2019-01-02: qty 250

## 2019-01-02 MED ORDER — SODIUM CHLORIDE 0.9 % IV SOLN: INTRAVENOUS | Status: AC

## 2019-01-02 MED ORDER — PERFLUTREN LIPID MICROSPHERE
INTRAVENOUS | Status: AC
  Administered 2019-01-02: 16:00:00 2 mL
  Filled 2019-01-02: qty 10

## 2019-01-02 MED ORDER — METOPROLOL TARTRATE 5 MG/5ML IV SOLN
INTRAVENOUS | Status: DC | PRN
  Administered 2019-01-02: 2.5 mg via INTRAVENOUS

## 2019-01-02 MED ORDER — SODIUM CHLORIDE 0.9 % IV SOLN: 250.0000 mL | INTRAVENOUS | Status: DC | PRN

## 2019-01-02 MED ORDER — MIDAZOLAM HCL 2 MG/2ML IJ SOLN
INTRAMUSCULAR | Status: DC | PRN
  Administered 2019-01-02: 1 mg via INTRAVENOUS

## 2019-01-02 MED ORDER — ACETAMINOPHEN 325 MG PO TABS: 650.0000 mg | ORAL_TABLET | ORAL | Status: DC | PRN

## 2019-01-02 MED ORDER — SODIUM CHLORIDE 0.9% FLUSH
3.0000 mL | Freq: Two times a day (BID) | INTRAVENOUS | Status: DC
  Administered 2019-01-02 – 2019-01-03 (×2): 3 mL via INTRAVENOUS

## 2019-01-02 MED ORDER — HEPARIN (PORCINE) IN NACL 1000-0.9 UT/500ML-% IV SOLN
INTRAVENOUS | Status: AC
  Filled 2019-01-02: qty 500

## 2019-01-02 MED ORDER — ONDANSETRON HCL 4 MG PO TABS: 8.0000 mg | ORAL_TABLET | Freq: Three times a day (TID) | ORAL | Status: DC | PRN

## 2019-01-02 MED ORDER — HEPARIN (PORCINE) IN NACL 1000-0.9 UT/500ML-% IV SOLN
INTRAVENOUS | Status: DC | PRN
  Administered 2019-01-02 (×2): 500 mL

## 2019-01-02 MED ORDER — HEPARIN SODIUM (PORCINE) 1000 UNIT/ML IJ SOLN
INTRAMUSCULAR | Status: AC
  Filled 2019-01-02: qty 1

## 2019-01-02 MED ORDER — ALPRAZOLAM 0.5 MG PO TABS: 0.5000 mg | ORAL_TABLET | Freq: Two times a day (BID) | ORAL | Status: DC | PRN

## 2019-01-02 MED ORDER — SODIUM CHLORIDE 0.9 % IV SOLN
INTRAVENOUS | Status: AC | PRN
  Administered 2019-01-02: 500 mL
  Administered 2019-01-02: 10 mL/h via INTRAVENOUS

## 2019-01-02 MED ORDER — IOHEXOL 350 MG/ML SOLN
INTRAVENOUS | Status: DC | PRN
  Administered 2019-01-02: 11:00:00 45 mL via INTRA_ARTERIAL

## 2019-01-02 MED ORDER — PERFLUTREN LIPID MICROSPHERE
1.0000 mL | INTRAVENOUS | Status: AC | PRN
  Administered 2019-01-02: 16:00:00 2 mL via INTRAVENOUS
  Filled 2019-01-02: qty 10

## 2019-01-02 MED ORDER — MIDAZOLAM HCL 2 MG/2ML IJ SOLN
INTRAMUSCULAR | Status: AC
  Filled 2019-01-02: qty 2

## 2019-01-02 MED ORDER — HEPARIN SOD (PORK) LOCK FLUSH 100 UNIT/ML IV SOLN
500.0000 [IU] | INTRAVENOUS | Status: DC | PRN
  Administered 2019-01-02: 13:00:00 500 [IU]
  Filled 2019-01-02 (×2): qty 5

## 2019-01-02 MED ORDER — PANTOPRAZOLE SODIUM 40 MG PO TBEC
40.0000 mg | DELAYED_RELEASE_TABLET | Freq: Every day | ORAL | Status: DC
  Administered 2019-01-03: 10:00:00 40 mg via ORAL
  Filled 2019-01-02: qty 1

## 2019-01-02 MED ORDER — LIDOCAINE HCL (PF) 1 % IJ SOLN
INTRAMUSCULAR | Status: AC
  Filled 2019-01-02: qty 30

## 2019-01-02 MED ORDER — PROCHLORPERAZINE MALEATE 10 MG PO TABS
10.0000 mg | ORAL_TABLET | Freq: Four times a day (QID) | ORAL | Status: DC | PRN
  Administered 2019-01-02: 23:00:00 10 mg via ORAL
  Filled 2019-01-02 (×3): qty 1

## 2019-01-02 MED ORDER — VERAPAMIL HCL 2.5 MG/ML IV SOLN
INTRAVENOUS | Status: DC | PRN
  Administered 2019-01-02: 10 mL via INTRA_ARTERIAL

## 2019-01-02 MED ORDER — SODIUM CHLORIDE 0.9% FLUSH: 3.0000 mL | INTRAVENOUS | Status: DC | PRN

## 2019-01-02 MED ORDER — NITROGLYCERIN 1 MG/10 ML FOR IR/CATH LAB
INTRA_ARTERIAL | Status: AC
  Filled 2019-01-02: qty 10

## 2019-01-02 MED ORDER — LOPERAMIDE HCL 2 MG PO CAPS: 4.0000 mg | ORAL_CAPSULE | ORAL | Status: DC | PRN

## 2019-01-02 MED ORDER — FENTANYL CITRATE (PF) 100 MCG/2ML IJ SOLN
INTRAMUSCULAR | Status: DC | PRN
  Administered 2019-01-02: 25 ug via INTRAVENOUS

## 2019-01-02 SURGICAL SUPPLY — 12 items
CATH INFINITI 5FR JK (CATHETERS) ×1 IMPLANT
DEVICE RAD COMP TR BAND LRG (VASCULAR PRODUCTS) ×1 IMPLANT
DEVICE RAD TR BAND REGULAR (VASCULAR PRODUCTS) ×1 IMPLANT
GLIDESHEATH SLEND SS 6F .021 (SHEATH) ×1 IMPLANT
GUIDEWIRE INQWIRE 1.5J.035X260 (WIRE) IMPLANT
INQWIRE 1.5J .035X260CM (WIRE) ×2
KIT ENCORE 26 ADVANTAGE (KITS) ×1 IMPLANT
KIT HEART LEFT (KITS) ×2 IMPLANT
PACK CARDIAC CATHETERIZATION (CUSTOM PROCEDURE TRAY) ×2 IMPLANT
SHEATH PROBE COVER 6X72 (BAG) ×1 IMPLANT
TRANSDUCER W/STOPCOCK (MISCELLANEOUS) ×2 IMPLANT
TUBING CIL FLEX 10 FLL-RA (TUBING) ×2 IMPLANT

## 2019-01-02 NOTE — ED Triage Notes (Signed)
Per [facility], pt from home with complaint of CP with shob and palpitations that began last night at 2000. Pt is now CP free. Given 324 asa in route. Pt noted to be in a-fib with rvr, code stremi activated in field. Pt roomed for short time, covid swabbed, blood drawn and then taken to cath lab by this RN and Education administrator. Axox4. Pt has 1 IV and on radiolucent zoll pads.

## 2019-01-02 NOTE — H&P (Addendum)
Cardiology Admission History and Physical:   Patient ID: Derek Lloyd MRN: [id number]; DOB: 03-16-1971   Admission date: 01/02/2019  Primary Care Provider: Patient, [name] Primary Cardiologist: No primary care provider on file.  Primary Electrophysiologist:  None   Chief Complaint:  Chest Pain  Patient Profile:   Derek Lloyd is a 48 y.o. male with a history of appendiceal carcinoma with abdominal mets and current tobacco use but no known cardiac history who presented to the ED via EMS for further evaluation of chest pain. EKG in the field showed atrial fibrillation with rate of 164 bpm and possible ST elevations in inferior and lateral leads. CODE STEMI was called.   History of Present Illness:   Derek Lloyd is a 48 year old male with a history of appendiceal carcinoma with abdominal mets and current tobacco use but no known cardiac history. Patient diagnosed with appendiceal carcinoma 2019. Patient s/p right diaphragm peritonectomy, splenectomy, right colectomy, sigmoid colectomy in 02/2018. Currently, receiving chemo. Patient states last session was on 12/31/2018.  Patient reports sudden onset of substernal chest pain yesterday evening around 8pm with radiation to neck and associated shortness of breath, palpitations, and diaphoresis. Pain would come and go about every hour. Patient initially thought it may just be side effects from chemo. Patient report chemo regimen recently changed and last session was day prior to onset of pain. He has had palpitations following chemo sessions before but has never had any chest pain like this.   Patient has a long history of tobacco use but states he has been trying to cut back. Currently smokes about 10 cigarettes per day. Also reports marijuana use to help with his appetite. No alcohol or cocaine use. No known family history of heart disease.  Patient received Aspirin 368m from EMS. However, he was not started on Heparin.   Patient was  tested for COVID-19 in the ED and then immediately transported to the cath lab for emergent cardiac catheterization.   Heart Pathway Score:     Past Medical History:  Diagnosis Date  . Abdominal pain 09/2017  . Anxiety attack   . apendix ca dx'd 09/2018   with intra abd mets  . Headache    "frequently" (08/14/2017)  . Heart murmur    "when I was a kid" (08/14/2017)  . [place] bowel obstruction ([place] 08/13/2017    Past Surgical History:  Procedure Laterality Date  . BIOPSY  10/02/2017   Procedure: BIOPSY;  Surgeon: KRonnette Juniper MD;  [facility];  Service: Gastroenterology;;  . COLONOSCOPY WITH PROPOFOL N/A 10/02/2017   Procedure: COLONOSCOPY WITH PROPOFOL;  Surgeon: KRonnette Juniper MD;  [place]  Service: Gastroenterology;  Laterality: N/A;  . FINGER SURGERY Left    "thumb"  . IR IMAGING GUIDED PORT INSERTION  11/01/2017  . IR PARACENTESIS  09/24/2017  . IR [place] ACC[place] RIGHT  11/01/2017  . POLYPECTOMY N/A 10/02/2017   Procedure: POLYPECTOMY;  Surgeon: KRonnette Juniper MD;  [facility] [facility]  Service: Gastroenterology;  Laterality: N/A;  . SUBMUCOSAL INJECTION  10/02/2017   Procedure: SUBMUCOSAL INJECTION;  Surgeon: KRonnette Juniper MD;  [facility];  Service: Gastroenterology;;  . TYMPANOSTOMY TUBE PLACEMENT Bilateral      Medications Prior to Admission: Prior to Admission medications   Medication Sig Start Date End Date Taking? Authorizing Provider  ALPRAZolam (Duanne Moron 0.5 MG tablet Take 1 tablet (0.5 mg total) by mouth 2 (two) times daily as needed for anxiety. 12/31/18   Sherrill,  Izola Price, MD  loperamide (IMODIUM) 2 MG capsule Take 2 capsules (4 mg total) by mouth as needed for diarrhea or loose stools. Maximum of 8 capsules/day 12/31/18   Icard, Leory Plowman L, DO  omeprazole (PRILOSEC) 20 MG capsule Take 1 capsule (20 mg total) by mouth daily. 12/31/18   Tanner, Lyndon Code., PA-C  ondansetron (ZOFRAN) 8 MG tablet Take 1 tablet (8 mg total) by mouth every 8  (eight) hours as needed for nausea or vomiting. 12/31/18   Icard, Octavio Graves, DO  prochlorperazine (COMPAZINE) 10 MG tablet Take 1 tablet (10 mg total) by mouth every 6 (six) hours as needed for nausea. 12/31/18   Garner Nash, DO     Allergies:    Allergies  Allergen Reactions  . Piperacillin-Tazobactam In Dex Rash    Noted on May 2019 admission.  Tolerates Cephalosporins.    Social History:   Social History   Socioeconomic History  . Marital status: Single    Spouse name: Not on file  . Number of children: Not on file  . Years of education: Not on file  . Highest education level: Not on file  Occupational History  . Not on file  Social Needs  . Financial resource strain: Not on file  . Food insecurity    Worry: Not on file    Inability: Not on file  . Transportation needs    Medical: Not on file    Non-medical: Not on file  Tobacco Use  . Smoking status: Current Some Day Smoker    Packs/day: 1.00    Types: Cigarettes    Last attempt to quit: 09/28/2016    Years since quitting: 2.2  . Smokeless tobacco: Never Used  . Tobacco comment: 08/14/2017 "I quit; can't tell you when I quit or when I started"  Substance and Sexual Activity  . Alcohol use: Yes    Comment: 08/14/2017 "I drink when I feel like it; nothing recently"  . Drug use: Yes    Types: Marijuana  . Sexual activity: Not on file  Lifestyle  . Physical activity    Days per week: Not on file    Minutes per session: Not on file  . Stress: Not on file  Relationships  . Social Herbalist on phone: Not on file    Gets together: Not on file    Attends religious service: Not on file    Active member of club or organization: Not on file    Attends meetings of clubs or organizations: Not on file    Relationship status: Not on file  . Intimate partner violence    Fear of current or ex partner: Not on file    Emotionally abused: Not on file    Physically abused: Not on file    Forced sexual activity:  Not on file  Other Topics Concern  . Not on file  Social History Narrative  . Not on file    Family History:   The patient's family history includes Prostate cancer in his paternal grandfather. There is no history of Crohn's disease.    ROS:  Please see the history of present illness.  Unable to obtain more detailed ROS due to need for emergency cardiac catheterization.    Physical Exam/Data:  There were no vitals filed for this visit. No intake or output data in the 24 hours ending 01/02/19 1029 Last 3 Weights 12/31/2018 12/26/2018 12/19/2018  Weight (lbs) 107 lb 1 oz 113 lb 106  lb 11.2 oz  Weight (kg) 48.563 kg 51.256 kg 48.399 kg     There is no height or weight on file to calculate BMI.  General: 48 y.o. thin male resting comfortably in no acute distress. Mildly diaphoretic. HEENT: Normocephalic and atraumatic. Sclera clear.  Neck: Supple. No JVD. Heart: Tachycardic with irregular irregular rhythm. Distinct S1 and S2. No murmurs, gallops, or rubs. Radial and distal pedal pulses 2+ and equal bilaterally. Port-a-Cath present. Lungs: No increased work of breathing. Clear to ausculation bilaterally. No wheezes, rhonchi, or rales.  Abdomen: Soft, non-distended, and non-tender to palpation. Bowel sounds present. MSK: Normal strength and tone for age. Extremities: No clubbing, cyanosis, or edema.    Skin: Warm and mildly diaphoretic. Neuro: Alert and oriented x3. No focal deficits. Psych: Normal affect. Responds appropriately.    EKG:  The ECG that was done was personally reviewed and demonstrates possible atrial fibrillation, rate 164 bpm, with mild ST elevation in inferior and lateral leads. However, this was also seen on prior EKG in 11/2017. Prior EKGs consistent with early repolarization changes.   Relevant CV Studies: None.  Laboratory Data:  High Sensitivity Troponin:  No results for input(s): TROPONINIHS in the last 720 hours.    Cardiac EnzymesNo results for input(s):  TROPONINI in the last 168 hours. No results for input(s): TROPIPOC in the last 168 hours.  Chemistry Recent Labs  Lab 12/31/18 1027  NA 139  K 4.1  CL 104  CO2 24  GLUCOSE 92  BUN 10  CREATININE 0.83  CALCIUM 9.5  GFRNONAA >60  GFRAA >60  ANIONGAP 11    Recent Labs  Lab 12/31/18 1027  PROT 7.1  ALBUMIN 3.6  AST 28  ALT 27  ALKPHOS 118  BILITOT 0.5   Hematology Recent Labs  Lab 12/31/18 1027  WBC 13.4*  RBC 4.63  HGB 14.4  HCT 43.5  MCV 94.0  MCH 31.1  MCHC 33.1  RDW 13.9  PLT 509*   BNPNo results for input(s): BNP, PROBNP in the last 168 hours.  DDimer No results for input(s): DDIMER in the last 168 hours.   Radiology/Studies:  No results found.  Assessment and Plan:   Possible STEMI - Patient presented with chest pain at rest with shorntess of breath, palpitations, and diaphoresis. - EKG shows possible atrial fibrillation, rate 164 bpm, with mild ST elevation in inferior and lateral leads. However, this was also seen on prior EKG in 11/2017. Prior EKGs consistent with early repolarization changes.  - COVID-19 pending. - Will check lipid panel and hemoglobin A1c. - Will check Echo. - Patient was taken immediately to cath lab for emergency cardiac catheterization. Cath showed normal coronaries. Full report pending. Further recommendations per Dr. Fletcher Anon.  Possible Atrial Fibrillation - Initial EKG showed possible atrial fibrillation vs. SVT. Currently in sinus rhythm. - Patient given IV Metoprol 2.52m in the cath lab. - CHA2DS2-VASc = 0. No need for anticoagulation.  Appendiceal Cancer - Patient diagnosed with appendiceal carcinoma 2019. Patient s/p right diaphragm peritonectomy, splenectomy, right colectomy, sigmoid colectomy in 02/2018. Currently, receiving chemo.    Severity of Illness: The appropriate patient status for this patient is INPATIENT. Inpatient status is judged to be reasonable and necessary in order to provide the required intensity  of service to ensure the patient's safety. The patient's presenting symptoms, physical exam findings, and initial radiographic and laboratory data in the context of their chronic comorbidities is felt to place them at high risk for further clinical deterioration. Furthermore,  it is not anticipated that the patient will be medically stable for discharge from the hospital within 2 midnights of admission. The following factors support the patient status of inpatient.   " The patient's presenting symptoms include chest pain, shortness of breath, palpitation. " The worrisome physical exam findings as above. " The initial radiographic and laboratory data are worrisome because of mild ST elevations in EKG. " The chronic co-morbidities include appendiceal carcinoma.   * I certify that at the point of admission it is my clinical judgment that the patient will require inpatient hospital care spanning beyond 2 midnights from the point of admission due to high intensity of service, high risk for further deterioration and high frequency of surveillance required.*    For questions or updates, please contact [place] Please consult [email or web address] for contact info under        Signed, Darreld Mclean, PA-C  01/02/2019 10:29 AM

## 2019-01-02 NOTE — ED Provider Notes (Signed)
[facility] CATH LAB Provider Note   CSN: [id number] Arrival date & time: 01/02/19  1016     History   Chief Complaint Chief Complaint  Patient presents with  . Code STEMI    HPI Derek Lloyd is a 48 y.o. male.  He has a history of colon cancer and is on active chemotherapy.  He said he started a new medication by infusion on Wednesday 2 days ago.  He is complaining of palpitations rapid heart rate irregular heart rate diaphoresis chest pain or shortness of breath on and off since 8 PM last night.  He called EMS today and they noted ST elevations and activated him for a STEMI.  On arrival here he said he is having 0 out of 10 chest pain but still having palpitations.  He does smoke denies cocaine no prior history of coronary disease.     The history is provided by the patient.  Palpitations Palpitations quality:  Fast Onset quality:  Sudden Duration:  15 hours Timing:  Intermittent Progression:  Unchanged Chronicity:  New Relieved by:  None tried Worsened by:  Nothing Ineffective treatments:  None tried Associated symptoms: chest pain, chest pressure, diaphoresis, dizziness, malaise/fatigue, nausea, near-syncope and shortness of breath   Associated symptoms: no lower extremity edema     Past Medical History:  Diagnosis Date  . Abdominal pain 09/2017  . Anxiety attack   . apendix ca dx'd 09/2018   with intra abd mets  . Headache    "frequently" (08/14/2017)  . Heart murmur    "when I was a kid" (08/14/2017)  . Small bowel obstruction ([place]) 08/13/2017    Patient Active Problem List   Diagnosis Date Noted  . SVT (supraventricular tachycardia) ([place]) 01/02/2019  . Unstable angina ([place])   . Appendix carcinoma ([place]) 12/19/2018  . Port-A-Cath in place 12/02/2017  . Primary appendiceal adenocarcinoma ([place]) 11/27/2017  . Metastatic carcinoma ([place]) 10/24/2017  . Goals of care, counseling/discussion 10/24/2017  . Abdominal pain 09/24/2017  .  Ascites 09/24/2017  . SBO (small bowel obstruction) ([place]) 08/13/2017  . Right lower quadrant abdominal abscess ([place]) 01/26/2017    Past Surgical History:  Procedure Laterality Date  . BIOPSY  10/02/2017   Procedure: BIOPSY;  Surgeon: [name], MD;  [facility];  Service: Gastroenterology;;  . COLONOSCOPY WITH PROPOFOL N/A 10/02/2017   Procedure: COLONOSCOPY WITH PROPOFOL;  Surgeon: [name], MD;  Location: [place];  Service: Gastroenterology;  Laterality: N/A;  . FINGER SURGERY Left    "thumb"  . IR IMAGING GUIDED PORT INSERTION  11/01/2017  . IR PARACENTESIS  09/24/2017  . IR US GUIDE VASC ACCESS RIGHT  11/01/2017  . POLYPECTOMY N/A 10/02/2017   Procedure: POLYPECTOMY;  Surgeon: [name], MD;  Location: [place];  Service: Gastroenterology;  Laterality: N/A;  . SUBMUCOSAL INJECTION  10/02/2017   Procedure: SUBMUCOSAL INJECTION;  Surgeon: [name], MD;  [facility];  Service: Gastroenterology;;  . TYMPANOSTOMY TUBE PLACEMENT Bilateral         Home Medications    Prior to Admission medications   Medication Sig Start Date End Date Taking? Authorizing Provider  ALPRAZolam Duanne Moron) 0.5 MG tablet Take 1 tablet (0.5 mg total) by mouth 2 (two) times daily as needed for anxiety. 12/31/18   Ladell Pier, MD  loperamide (IMODIUM) 2 MG capsule Take 2 capsules (4 mg total) by mouth as needed for diarrhea or loose stools. Maximum of 8 capsules/day 12/31/18   June Leap  L, DO  omeprazole (PRILOSEC) 20 MG capsule Take 1 capsule (20 mg total) by mouth daily. 12/31/18   Tanner, Lyndon Code., PA-C  ondansetron (ZOFRAN) 8 MG tablet Take 1 tablet (8 mg total) by mouth every 8 (eight) hours as needed for nausea or vomiting. 12/31/18   Icard, Octavio Graves, DO  prochlorperazine (COMPAZINE) 10 MG tablet Take 1 tablet (10 mg total) by mouth every 6 (six) hours as needed for nausea. 12/31/18   Garner Nash, DO    Family History Family History  Problem Relation Age of  Onset  . Prostate cancer Paternal Grandfather   . Crohn's disease Neg Hx     Social History Social History   Tobacco Use  . Smoking status: Current Some Day Smoker    Packs/day: 1.00    Types: Cigarettes    Last attempt to quit: 09/28/2016    Years since quitting: 2.2  . Smokeless tobacco: Never Used  . Tobacco comment: 08/14/2017 "I quit; can't tell you when I quit or when I started"  Substance Use Topics  . Alcohol use: Yes    Comment: 08/14/2017 "I drink when I feel like it; nothing recently"  . Drug use: Yes    Types: Marijuana     Allergies   Piperacillin-tazobactam in dex   Review of Systems Review of Systems  Constitutional: Positive for diaphoresis and malaise/fatigue. Negative for fever.  HENT: Negative for sore throat.   Eyes: Negative for visual disturbance.  Respiratory: Positive for shortness of breath.   Cardiovascular: Positive for chest pain, palpitations and near-syncope.  Gastrointestinal: Positive for nausea.  Genitourinary: Negative for dysuria.  Musculoskeletal: Negative for neck pain.  Skin: Negative for rash.  Neurological: Positive for dizziness.     Physical Exam Updated Vital Signs BP 93/66   Pulse 87   Temp 98 F (36.7 C) (Oral)   Resp (!) 24   Ht 5' 11"  (1.803 m)   Wt 48.6 kg   SpO2 95%   BMI 14.94 kg/m   Physical Exam Vitals signs and nursing note reviewed.  Constitutional:      Appearance: He is well-developed.  HENT:     Head: Normocephalic and atraumatic.  Eyes:     Conjunctiva/sclera: Conjunctivae normal.  Neck:     Musculoskeletal: Neck supple.  Cardiovascular:     Rate and Rhythm: Tachycardia present. Rhythm irregular.     Pulses: Normal pulses.     Heart sounds: No murmur.  Pulmonary:     Effort: Pulmonary effort is normal. No respiratory distress.     Breath sounds: Normal breath sounds.     Comments: Patient has a port in his right upper chest with no surrounding erythema. Abdominal:     Palpations: Abdomen  is soft.     Tenderness: There is no abdominal tenderness.     Comments: Patient has multiple well-healed surgical incisions over his abdomen.  Musculoskeletal: Normal range of motion.     Right lower leg: No edema.     Left lower leg: No edema.  Skin:    General: Skin is warm and dry.     Capillary Refill: Capillary refill takes less than 2 seconds.  Neurological:     General: No focal deficit present.     Mental Status: He is alert and oriented to person, place, and time.      ED Treatments / Results  Labs (all labs ordered are listed, but only abnormal results are displayed) Labs Reviewed  CBC - Abnormal;  Notable for the following components:      Result Value   WBC 13.0 (*)    Hemoglobin 17.4 (*)    HCT 52.7 (*)    Platelets 577 (*)    All other components within normal limits  SARS CORONAVIRUS 2 (HOSPITAL ORDER, [facility])  LIPID PANEL  HEMOGLOBIN A1C    EKG None  Radiology No results found.  Procedures Procedures (including critical care time)  Medications Ordered in ED Medications  metoprolol tartrate (LOPRESSOR) injection (2.5 mg Intravenous Given 01/02/19 1028)  lidocaine (PF) (XYLOCAINE) 1 % injection (2 mLs Subcutaneous Given 01/02/19 1034)  fentaNYL (SUBLIMAZE) injection (25 mcg Intravenous Given 01/02/19 1034)  midazolam (VERSED) injection (1 mg Intravenous Given 01/02/19 1035)  Radial Cocktail/Verapamil only (10 mLs Intra-arterial Given 01/02/19 1040)  norepinephrine (LEVOPHED) 4 mg in dextrose 5 % 250 mL (0.016 mg/mL) infusion (10 mcg/min Intravenous New Bag/Given 01/02/19 1045)  heparin injection (3,000 Units Intravenous Given 01/02/19 1040)  0.9 %  sodium chloride infusion (500 mLs  Other (enter comment in med admin window) 01/02/19 1045)  Heparin (Porcine) in NaCl 1000-0.9 UT/500ML-% SOLN (500 mLs  Given 01/02/19 1100)  metoprolol tartrate (LOPRESSOR) 5 MG/5ML injection (has no administration in time range)  nitroGLYCERIN 100  mcg/mL intra-arterial injection (has no administration in time range)  heparin 1000 UNIT/ML injection (has no administration in time range)  Heparin (Porcine) in NaCl 1000-0.9 UT/500ML-% SOLN (has no administration in time range)  Heparin (Porcine) in NaCl 1000-0.9 UT/500ML-% SOLN (has no administration in time range)  midazolam (VERSED) 2 MG/2ML injection (has no administration in time range)  fentaNYL (SUBLIMAZE) 100 MCG/2ML injection (has no administration in time range)  lidocaine (PF) (XYLOCAINE) 1 % injection (has no administration in time range)  norepinephrine (LEVOPHED) 4-5 MG/250ML-% infusion SOLN (has no administration in time range)     Initial Impression / Assessment and Plan / ED Course  I have reviewed the triage vital signs and the nursing notes.  Pertinent labs & imaging results that were available during my care of the patient were reviewed by me and considered in my medical decision making (see chart for details).  Clinical Course as of Jan 02 1132  Fri Jan 02, 2019  1132 Patient's prehospital EKG shows what appears to be atrial tachycardia possibly A. fib rapid rate 150s with ST elevations inferiorly and also possibly anteriorly.  No prior EKGs available for comparison.   [MB]  1132 Patient was seen by me and Covid test was ordered.  1 of the PAs from the cardiology interventional team came down to evaluate the patient and escorted him up to the Cath Lab.   [MB]    Clinical Course User Index [MB] Hayden Rasmussen, MD         Final Clinical Impressions(s) / ED Diagnoses   Final diagnoses:  Atrial tachycardia [facility])  Chest pain of uncertain etiology    ED Discharge Orders    None       Hayden Rasmussen, MD 01/03/19 [phone]

## 2019-01-02 NOTE — Progress Notes (Signed)
This chaplain phoned ED and spoke to Derek Lloyd.  The chaplain understands spiritual care is not needed at this time, the Pt. is transitioning to the Cath Lab with no family present.  The chaplain is available for F/U spiritual care as needed.

## 2019-01-02 NOTE — Progress Notes (Signed)
  Echocardiogram 2D Echocardiogram has been performed.  Derek Lloyd 01/02/2019, 4:38 PM

## 2019-01-03 DIAGNOSIS — I420 Dilated cardiomyopathy: Secondary | ICD-10-CM

## 2019-01-03 MED ORDER — SODIUM CHLORIDE 0.9 % IV SOLN
INTRAVENOUS | Status: AC
  Administered 2019-01-03: 125 mL/h via INTRAVENOUS

## 2019-01-03 MED ORDER — LOPERAMIDE HCL 2 MG PO CAPS: 4.0000 mg | ORAL_CAPSULE | ORAL | 0 refills | Status: DC | PRN

## 2019-01-03 MED ORDER — CARVEDILOL 3.125 MG PO TABS: 3.1250 mg | ORAL_TABLET | Freq: Two times a day (BID) | ORAL | 6 refills | Status: AC

## 2019-01-03 MED ORDER — ASPIRIN EC 81 MG PO TBEC: 81.0000 mg | DELAYED_RELEASE_TABLET | Freq: Every day | ORAL | 2 refills | Status: DC

## 2019-01-03 MED ORDER — ONDANSETRON HCL 8 MG PO TABS: 8.0000 mg | ORAL_TABLET | Freq: Three times a day (TID) | ORAL | 0 refills | Status: DC | PRN

## 2019-01-03 MED ORDER — CARVEDILOL 3.125 MG PO TABS
3.1250 mg | ORAL_TABLET | Freq: Two times a day (BID) | ORAL | Status: DC
  Administered 2019-01-03 (×2): 3.125 mg via ORAL
  Filled 2019-01-03 (×2): qty 1

## 2019-01-03 MED ORDER — ENSURE ENLIVE PO LIQD
237.0000 mL | Freq: Three times a day (TID) | ORAL | Status: DC
  Administered 2019-01-03 (×2): 237 mL via ORAL

## 2019-01-03 MED ORDER — ADULT MULTIVITAMIN W/MINERALS CH
1.0000 | ORAL_TABLET | Freq: Every day | ORAL | Status: DC
  Administered 2019-01-03: 1 via ORAL
  Filled 2019-01-03: qty 1

## 2019-01-03 MED ORDER — OMEPRAZOLE 20 MG PO CPDR: 20.0000 mg | DELAYED_RELEASE_CAPSULE | Freq: Every day | ORAL | 5 refills | Status: DC

## 2019-01-03 MED ORDER — ASPIRIN 81 MG PO CHEW
81.0000 mg | CHEWABLE_TABLET | Freq: Every day | ORAL | Status: DC
  Administered 2019-01-03: 10:00:00 81 mg via ORAL
  Filled 2019-01-03: qty 1

## 2019-01-03 NOTE — Plan of Care (Signed)
  Problem: Education: Goal: Understanding of CV disease, CV risk reduction, and recovery process will improve Outcome: Adequate for Discharge Goal: Individualized Educational Video(s) Outcome: Adequate for Discharge   Problem: Activity: Goal: Ability to return to baseline activity level will improve Outcome: Adequate for Discharge   Problem: Cardiovascular: Goal: Ability to achieve and maintain adequate cardiovascular perfusion will improve Outcome: Adequate for Discharge Goal: Vascular access site(s) Level 0-1 will be maintained Outcome: Adequate for Discharge   Problem: Health Behavior/Discharge Planning: Goal: Ability to safely manage health-related needs after discharge will improve Outcome: Adequate for Discharge   Problem: Health Behavior/Discharge Planning: Goal: Ability to manage health-related needs will improve Outcome: Adequate for Discharge   Problem: Clinical Measurements: Goal: Ability to maintain clinical measurements within normal limits will improve Outcome: Adequate for Discharge Goal: Will remain free from infection Outcome: Adequate for Discharge Goal: Diagnostic test results will improve Outcome: Adequate for Discharge Goal: Respiratory complications will improve Outcome: Adequate for Discharge Goal: Cardiovascular complication will be avoided Outcome: Adequate for Discharge   Problem: Nutrition: Goal: Adequate nutrition will be maintained Outcome: Adequate for Discharge   Problem: Coping: Goal: Level of anxiety will decrease Outcome: Adequate for Discharge   Problem: Elimination: Goal: Will not experience complications related to bowel motility Outcome: Adequate for Discharge Goal: Will not experience complications related to urinary retention Outcome: Adequate for Discharge   Problem: Pain Managment: Goal: General experience of comfort will improve Outcome: Adequate for Discharge   Problem: Safety: Goal: Ability to remain free from injury  will improve Outcome: Adequate for Discharge   Problem: Skin Integrity: Goal: Risk for impaired skin integrity will decrease Outcome: Adequate for Discharge

## 2019-01-03 NOTE — Progress Notes (Signed)
Initial Nutrition Assessment  DOCUMENTATION CODES:   Underweight(likely chronic severe malnutrition)  INTERVENTION:    Ensure Enlive po TID, each supplement provides 350 kcal and 20 grams of protein  MVI daily  NUTRITION DIAGNOSIS:   Increased nutrient needs related to cancer and cancer related treatments as evidenced by estimated needs.  GOAL:   Patient will meet greater than or equal to 90% of their needs   MONITOR:   PO intake, Supplement acceptance, Weight trends, Labs, I & O's  REASON FOR ASSESSMENT:   Malnutrition Screening Tool    ASSESSMENT:   Patient with PMH significant for appendiceal carcinoma with abdominal mets s/p right diaphragm peritonectomy, splenectomy, R colectomy, sigmoid colectomy in 02/2018 and currently on chemotherapy. Presents this admission with possible STEMI.   RD working remotely.  Attempted to speak with pt via phone. He requests for RD to call back at later date as he is expecting a phone call from family. Pt has been followed by cancer center RDs in the past. He was diagnosed with severe malnutrition. Suspects this continues. Pt previously provided Ensure, will continue this admission. Will need to obtain nutrition/weight history if possible.   Per chart records, pt weighed 57.2 kg one year ago and 48.6 kg this admission (15% wt loss in one year, insignificant for time frame). Per RD note one year ago pt was starting to gain weight back.   I/O: +993 ml since admit UOP: 400 ml x 24 hrs  Medications: reviewed  Labs: CBG 92-111  Diet Order:   Diet Order            Diet Heart Room service appropriate? Yes; Fluid consistency: Thin  Diet effective now              EDUCATION NEEDS:   Not appropriate for education at this time  Skin:  Skin Assessment: Reviewed RN Assessment  Last BM:  PTA  Height:   Ht Readings from Last 1 Encounters:  01/02/19 5' 11"  (1.803 m)    Weight:   Wt Readings from Last 1 Encounters:  01/02/19  48.6 kg    Ideal Body Weight:  78.2 kg  BMI:  Body mass index is 14.94 kg/m.  Estimated Nutritional Needs:   Kcal:  1650-1850 kcal  Protein:  80-95 grams  Fluid:  >/= 1.6 L/day   Mariana Single RD, LDN Clinical Nutrition Pager # - [phone]

## 2019-01-03 NOTE — Progress Notes (Signed)
EKG CRITICAL VALUE     12 lead EKG performed.  Critical value noted. Judson Roch, RN notified.   Loucille Takach L, CCT 01/03/2019 9:46 AM

## 2019-01-03 NOTE — Progress Notes (Signed)
Dr. Georgette Shell was notified about the patient's SBP being in the 70-80 and MAPs in the 70s all night. Patient is asymptomatic.

## 2019-01-03 NOTE — Progress Notes (Addendum)
PM coreg dose given to patient before d/c. Patient educated. Discharge instructions reviewed. Patient belongings at bedside going home with patient. CSW consulted for cab voucher. [employer] used. Patient was picked up from the [place] [facility] entrance by [place] with [employer] in car 31. Patient returning to home address via [place] taxi services.   Alma Friendly RN

## 2019-01-03 NOTE — Progress Notes (Addendum)
Progress Note  Patient Name: Derek Lloyd Date of Encounter: 01/03/2019  Primary Cardiologist: Dr Fletcher Anon  Subjective   No CP or dyspnea  Inpatient Medications    Scheduled Meds: . heparin lock flush  500 Units Intracatheter Q30 days  . pantoprazole  40 mg Oral Daily  . sodium chloride flush  3 mL Intravenous Q12H   Continuous Infusions: . sodium chloride     PRN Meds: sodium chloride, acetaminophen, ALPRAZolam, heparin lock flush **AND** heparin lock flush, loperamide, ondansetron (ZOFRAN) IV, ondansetron, prochlorperazine, sodium chloride flush   Vital Signs    Vitals:   01/03/19 0500 01/03/19 0600 01/03/19 0700 01/03/19 0814  BP: (!) 89/69 91/67 93/66    Pulse: 86 87 87   Resp: (!) 27 (!) 25 (!) 24   Temp:    98 F (36.7 C)  TempSrc:    Oral  SpO2: 96% 95% 95%   Weight:      Height:        Intake/Output Summary (Last 24 hours) at 01/03/2019 0905 Last data filed at 01/03/2019 0700 Gross per 24 hour  Intake 1492.8 ml  Output 500 ml  Net 992.8 ml   Last 3 Weights 01/02/2019 12/31/2018 12/26/2018  Weight (lbs) 107 lb 2.3 oz 107 lb 1 oz 113 lb  Weight (kg) 48.6 kg 48.563 kg 51.256 kg      Telemetry    Sinus - Personally Reviewed   Physical Exam   GEN: No acute distress.  Thin Neck: No JVD Cardiac: RRR, no murmurs, rubs, or gallops.  Respiratory: Clear to auscultation bilaterally. GI: Soft, nontender, non-distended  MS: No edema; radial cath site with no hematoma Neuro:  Nonfocal  Psych: Normal affect   Labs    High Sensitivity Troponin:   Recent Labs  Lab 01/02/19 1057 01/02/19 1250 01/02/19 1502  TROPONINIHS 27* 62* 76*      Chemistry Recent Labs  Lab 12/31/18 1027 01/02/19 1250  NA 139 140  K 4.1 4.3  CL 104 106  CO2 24 21*  GLUCOSE 92 111*  BUN 10 13  CREATININE 0.83 0.83  CALCIUM 9.5 9.6  PROT 7.1  --   ALBUMIN 3.6  --   AST 28  --   ALT 27  --   ALKPHOS 118  --   BILITOT 0.5  --   GFRNONAA >60 >60  GFRAA >60 >60   ANIONGAP 11 13     Hematology Recent Labs  Lab 12/31/18 1027 01/02/19 1018  WBC 13.4* 13.0*  RBC 4.63 5.47  HGB 14.4 17.4*  HCT 43.5 52.7*  MCV 94.0 96.3  MCH 31.1 31.8  MCHC 33.1 33.0  RDW 13.9 14.0  PLT 509* 577*    Patient Profile     48 y.o. male with metastatic appendiceal cancer status post chemotherapy admitted with atrial fibrillation versus SVT and diffuse ST elevation.  Cardiac catheterization revealed no coronary disease.  Echocardiogram showed severely reduced LV function with ejection fraction 25 to 30%.  Left ventricular end-diastolic pressure 5.  Assessment & Plan    1 atrial fibrillation versus supraventricular tachycardia-electrocardiogram is not available for review.  He is in sinus rhythm this morning.  I will add low-dose carvedilol 3.125 mg twice daily to see if he tolerates.  We will continue with aspirin at this point.  2 nonischemic cardiomyopathy-cardiac catheterization revealed no coronary disease.  He has no recent alcohol abuse although does remotely.  May be related to chemotherapy.  He has severely reduced LV function.  He was hypotensive yesterday but blood pressure has improved following hydration.  I will add low-dose carvedilol 3.125 mg twice daily.  Can increase or add ARB as an outpatient.  Blood pressure likely will not tolerate Entresto.  Patient not a candidate for aggressive intervention such as ICD given metastatic cancer.  3 metastatic appendiceal cancer-Per oncology.  I have requested that patient remain in hospital today for further hydration and monitoring of rhythm given initial presentation.  However he is adamant that he would like to be discharged.  He stated "I am not afraid to die" and I do not want to spend what little time I have left in the hospital.  He understands he has metastatic appendiceal carcinoma and his life expectancy is short.  We will honor his request.  He understands the risk of early discharge.  >30 min PA and  physician time D2  For questions or updates, please contact [place] Please consult [email or web address] for contact info under        Signed, Kirk Ruths, MD  01/03/2019, 9:05 AM

## 2019-01-03 NOTE — Discharge Summary (Signed)
Discharge Summary    Patient ID: Derek Lloyd MRN: [id number]; DOB: December 02, 1970  Admit date: 01/02/2019 Discharge date: 01/03/2019  Primary Care Provider: Patient, [name]  Primary Cardiologist: Dr. Stanford Breed  Discharge Diagnoses    Active Problems:   SVT (supraventricular tachycardia) ([place])   Unstable angina ([facility])   Atrial tachycardia ([place])   NICM   Metastatic appendiceal cancer  Allergies Allergies  Allergen Reactions   Piperacillin-Tazobactam In Dex Rash    Noted on May 2019 admission.  Tolerates Cephalosporins.    Diagnostic Studies/Procedures    Echo 01/02/2019 1. The left ventricle has severely reduced systolic function, with an ejection fraction of 20-25%. The cavity size was normal. Left ventricular diastolic function could not be evaluated due to indeterminate diastolic function. Elevated left ventricular  end-diastolic pressure.  2. The right ventricle has moderately reduced systolic function. The cavity was normal. There is no increase in right ventricular wall thickness. Right ventricular systolic pressure could not be assessed.  3. The aortic valve is grossly normal.  4. The aorta is normal unless otherwise noted.  5. Definity contrast agent was given IV to delineate the left ventricular endocardial borders. Possible early forming LV thrombus in the LV apex.  LEFT HEART CATH AND CORONARY ANGIOGRAPHY 01/02/2019  Conclusion    There is moderate to severe left ventricular systolic dysfunction.  LV end diastolic pressure is normal.   1.  Normal coronary arteries. 2.  Moderately to severely reduced LV systolic function with an EF of 30 to 35%. 3.  Low left ventricular end-diastolic pressure.  Recommendations: I suspect that the patient had a early repolarization in his EKG in the setting of significant tachycardia.  He responded well to IV metoprolol and was in sinus rhythm during the procedure.  However, he developed hypotension after metoprolol and  sedation.  He was given IV fluid boluses and was started on small dose norepinephrine drip.  He was hemodynamically stable by the end of the case. I suspect that the patient is volume depleted but he also has underlying cardiomyopathy which is nonischemic.  I ordered an echocardiogram to reevaluate his ejection fraction given that left-ventricular angiography was performed when he was hypotensive.    History of Present Illness     Derek Lloyd is a 48 y.o. male with a history of appendiceal carcinoma with abdominal mets and current tobacco use but no known cardiac history who presented to the ED via EMS for further evaluation of chest pain. EKG in the field showed atrial fibrillation with rate of 164 bpm and possible ST elevations in inferior and lateral leads. CODE STEMI was called.   Patient diagnosed with appendiceal carcinoma 2019. Patient s/p right diaphragm peritonectomy, splenectomy, right colectomy, sigmoid colectomy in 02/2018.  He was started 2 days ago on a new cycle of FOLFIRI chemotherapy.  After the first cycle, experienced abdominal pain and some other GI symptoms.  He presented with palpitations and substernal chest tightness and heaviness.  He was noted to be significantly tachycardic on presentation possibly due to A. fib with RVR or SVT.  In addition, he had diffuse inferior and anterolateral ST elevation.  Given his symptoms and EKG changes, I code STEMI was activated.  Hospital Course     Consultants: None   He was taken to cath lab given possible STEMI which showed normal coronaries. patient was given 2.5 mg of IV metoprolol to slow down his tachycardia.  With that and with IV sedation, the patient's blood pressure dropped during  catheterization and he was given a 500 mL of normal saline bolus and was started on small dose norepinephrine drip.  He was noted to have an LVEDP of 5. His presentation was more consistent with volume depletion and tachyarrhythmias. He was  spontaneously converted to sinus at controlled rate. BP soft low. Tolerated addition of low dose coreg at 3.127m BID. CHASDVASCs score of 0. Plan to continue ASA 84mdaily. Echo with LVEF of 20-25% and elevated LVEDP. Felt chemotherapy induced. Can increase or add ARB as an outpatient.  Blood pressure likely will not tolerate Entresto.  Patient not a candidate for aggressive intervention such as ICD given metastatic cancer. Recommended another day to say in hospital however presented wished to go home. See progress note. Ambulated well prior to discharge.    The patient been seen by Dr. CrStanford Breedoday and deemed ready for discharge home. All follow-up appointments have been scheduled. Discharge medications are listed below.    Discharge Vitals Blood pressure 98/66, pulse 72, temperature 98 F (36.7 C), temperature source Oral, resp. rate (!) 24, height 5' 11"  (1.803 m), weight 48.6 kg, SpO2 95 %.  Filed Weights   01/02/19 1200  Weight: 48.6 kg    Labs & Radiologic Studies    CBC Recent Labs    01/02/19 1018  WBC 13.0*  HGB 17.4*  HCT 52.7*  MCV 96.3  PLT 57417  Basic Metabolic Panel Recent Labs    01/02/19 1250  NA 140  K 4.3  CL 106  CO2 21*  GLUCOSE 111*  BUN 13  CREATININE 0.83  CALCIUM 9.6   Hemoglobin A1C Recent Labs    01/02/19 1250  HGBA1C 5.5   Fasting Lipid Panel Recent Labs    01/02/19 1057  CHOL 178  HDL 53  LDLCALC 108*  TRIG 83  CHOLHDL 3.4   _____________  Dg Chest 1 View  Result Date: 12/17/2018 CLINICAL DATA:  Right pleural effusion. Status post thoracentesis. EXAM: CHEST  1 VIEW COMPARISON:  CT scan dated 12/05/2018 and chest x-ray dated 12/04/2017 FINDINGS: There is no pneumothorax after right thoracentesis. Small residual right effusion. Tiny left effusion. Heart size and vascularity are normal. No consolidative infiltrates. Power port appears in good position with the tip above the cavoatrial junction. Bones appear normal. IMPRESSION: No  pneumothorax after right thoracentesis. Small residual right effusion. Tiny left effusion. Electronically Signed   By: JaLorriane Shire.D.   On: 12/17/2018 11:42   Ct Chest W Contrast  Result Date: 12/05/2018 CLINICAL DATA:  History of appendiceal cancer. EXAM: CT CHEST, ABDOMEN, AND PELVIS WITH CONTRAST TECHNIQUE: Multidetector CT imaging of the chest, abdomen and pelvis was performed following the standard protocol during bolus administration of intravenous contrast. CONTRAST:  10042mMNIPAQUE IOHEXOL 300 MG/ML  SOLN COMPARISON:  CT scan 09/26/2017 FINDINGS: CT CHEST FINDINGS Cardiovascular: The heart is normal in size. No pericardial effusion. The aorta is normal in caliber. No dissection. The branch vessels are patent. The pulmonary arteries appear normal. Mediastinum/Nodes: No mediastinal or hilar mass or lymphadenopathy. Small scattered lymph nodes are stable. The esophagus is grossly normal. Lungs/Pleura: There is a moderate-sized right pleural effusion and a small left pleural effusion. Fairly extensive nodularity noted along the major fissures bilaterally and also along the right minor fissure highly suspicious for diffuse pleural disease and likely accounting for the patient's effusions. There also appears to be involvement of the medial pleura. No worrisome pulmonary lesions. Musculoskeletal: No chest wall mass, supraclavicular or axillary adenopathy. The  thyroid gland appears normal. No bone lesions. CT ABDOMEN PELVIS FINDINGS Hepatobiliary: No focal hepatic lesions or intrahepatic biliary dilatation. No obvious peritoneal surface disease. The portal and hepatic veins are patent. The gallbladder is unremarkable. No common bile duct dilatation. Pancreas: No mass, inflammation or ductal dilatation. Spleen: Status post splenectomy. Adrenals/Urinary Tract: Adrenal glands and kidneys are unremarkable. The bladder is grossly normal. Stomach/Bowel: The stomach,, duodenum, small bowel and colon are  unremarkable. No acute inflammatory changes, mass lesions or obstructive findings. Stable surgical changes from a right colectomy and partial sigmoid colectomy. Vascular/Lymphatic: Age advanced atherosclerotic calcifications involving the aorta and iliac arteries but no aneurysm. The major venous structures are patent. Stable scattered retroperitoneal lymph nodes. No recurrent omental disease is demonstrated. There is a difficult to measure infiltrating soft tissue mass involving the sigmoid colon this appears to measure at least 5.3 x 4.9 cm. Difficult to measure on the prior study but it has definitely progressed. This is just below the anastomosis. No enlarged pelvic lymph nodes. No inguinal adenopathy. Reproductive: The prostate gland and seminal vesicles are unremarkable. Other: No free pelvic fluid collections. No inguinal mass and no abdominal wall hernia. No abdominal/pelvic ascites. Musculoskeletal: No significant bony findings. IMPRESSION: 1. New bilateral pleural effusions, moderate on the right and small on the left. There appears to be extensive nodularity along the pleural fissures on both sides and also along the medial pleura on the right. Findings most consistent with diffuse pleural disease. 2. No recurrent omental disease. 3. Progressive appearing ill-defined infiltrating soft tissue mass in the pelvis involving the rectosigmoid junction just below the anastomosis. Electronically Signed   By: Marijo Sanes M.D.   On: 12/05/2018 15:17   Ct Abdomen Pelvis W Contrast  Result Date: 12/05/2018 CLINICAL DATA:  History of appendiceal cancer. EXAM: CT CHEST, ABDOMEN, AND PELVIS WITH CONTRAST TECHNIQUE: Multidetector CT imaging of the chest, abdomen and pelvis was performed following the standard protocol during bolus administration of intravenous contrast. CONTRAST:  14m OMNIPAQUE IOHEXOL 300 MG/ML  SOLN COMPARISON:  CT scan 09/26/2017 FINDINGS: CT CHEST FINDINGS Cardiovascular: The heart is normal  in size. No pericardial effusion. The aorta is normal in caliber. No dissection. The branch vessels are patent. The pulmonary arteries appear normal. Mediastinum/Nodes: No mediastinal or hilar mass or lymphadenopathy. Small scattered lymph nodes are stable. The esophagus is grossly normal. Lungs/Pleura: There is a moderate-sized right pleural effusion and a small left pleural effusion. Fairly extensive nodularity noted along the major fissures bilaterally and also along the right minor fissure highly suspicious for diffuse pleural disease and likely accounting for the patient's effusions. There also appears to be involvement of the medial pleura. No worrisome pulmonary lesions. Musculoskeletal: No chest wall mass, supraclavicular or axillary adenopathy. The thyroid gland appears normal. No bone lesions. CT ABDOMEN PELVIS FINDINGS Hepatobiliary: No focal hepatic lesions or intrahepatic biliary dilatation. No obvious peritoneal surface disease. The portal and hepatic veins are patent. The gallbladder is unremarkable. No common bile duct dilatation. Pancreas: No mass, inflammation or ductal dilatation. Spleen: Status post splenectomy. Adrenals/Urinary Tract: Adrenal glands and kidneys are unremarkable. The bladder is grossly normal. Stomach/Bowel: The stomach,, duodenum, small bowel and colon are unremarkable. No acute inflammatory changes, mass lesions or obstructive findings. Stable surgical changes from a right colectomy and partial sigmoid colectomy. Vascular/Lymphatic: Age advanced atherosclerotic calcifications involving the aorta and iliac arteries but no aneurysm. The major venous structures are patent. Stable scattered retroperitoneal lymph nodes. No recurrent omental disease is demonstrated. There is a  difficult to measure infiltrating soft tissue mass involving the sigmoid colon this appears to measure at least 5.3 x 4.9 cm. Difficult to measure on the prior study but it has definitely progressed. This is  just below the anastomosis. No enlarged pelvic lymph nodes. No inguinal adenopathy. Reproductive: The prostate gland and seminal vesicles are unremarkable. Other: No free pelvic fluid collections. No inguinal mass and no abdominal wall hernia. No abdominal/pelvic ascites. Musculoskeletal: No significant bony findings. IMPRESSION: 1. New bilateral pleural effusions, moderate on the right and small on the left. There appears to be extensive nodularity along the pleural fissures on both sides and also along the medial pleura on the right. Findings most consistent with diffuse pleural disease. 2. No recurrent omental disease. 3. Progressive appearing ill-defined infiltrating soft tissue mass in the pelvis involving the rectosigmoid junction just below the anastomosis. Electronically Signed   By: Marijo Sanes M.D.   On: 12/05/2018 15:17   US Thoracentesis Asp Pleural Space W/img Guide  Result Date: 12/17/2018 INDICATION: Patient with history of appendiceal cancer, right pleural effusion. Request is made for diagnostic and therapeutic right thoracentesis. EXAM: ULTRASOUND GUIDED DIAGNOSTIC AND THERAPEUTIC RIGHT THORACENTESIS MEDICATIONS: 10 mL 1% lidocaine COMPLICATIONS: None immediate. PROCEDURE: An ultrasound guided thoracentesis was thoroughly discussed with the patient and questions answered. The benefits, risks, alternatives and complications were also discussed. The patient understands and wishes to proceed with the procedure. Written consent was obtained. Ultrasound was performed to localize and mark an adequate pocket of fluid in the right chest. The area was then prepped and draped in the normal sterile fashion. 1% Lidocaine was used for local anesthesia. Under ultrasound guidance a 6 Fr Safe-T-Centesis catheter was introduced. Thoracentesis was performed. The catheter was removed and a dressing applied. FINDINGS: A total of approximately 1.0 L of clear, yellow fluid was removed. Samples were sent to the  laboratory as requested by the clinical team. IMPRESSION: Successful ultrasound guided diagnostic and therapeutic right thoracentesis yielding 1.0 L of pleural fluid. Read by: Brynda Greathouse PA-C Electronically Signed   By: Jacqulynn Cadet M.D.   On: 12/17/2018 17:08   Disposition   Pt is being discharged home today in good condition.  Follow-up Plans & Appointments    Follow-up Information    Lelon Perla, MD Follow up.   Specialty: Cardiology Why: office will call  you with time and date of appointment  Contact information: [address] [place] [address] [phone]          Discharge Instructions    Diet - low sodium heart healthy   Complete by: As directed    Discharge instructions   Complete by: As directed    No driving for 48 hours. No lifting over 5 lbs for 1 week. No sexual activity for 1 week. Keep procedure site clean & dry. If you notice increased pain, swelling, bleeding or pus, call/return!  You may shower, but no soaking baths/hot tubs/pools for 1 week.   Increase activity slowly   Complete by: As directed       Discharge Medications   Allergies as of 01/03/2019      Reactions   Piperacillin-tazobactam In Dex Rash   Noted on May 2019 admission.  Tolerates Cephalosporins.      Medication List    TAKE these medications   ALPRAZolam 0.5 MG tablet Commonly known as: XANAX Take 1 tablet (0.5 mg total) by mouth 2 (two) times daily as needed for anxiety.   aspirin EC  81 MG tablet Take 1 tablet (81 mg total) by mouth daily.   carvedilol 3.125 MG tablet Commonly known as: COREG Take 1 tablet (3.125 mg total) by mouth 2 (two) times daily with a meal.   loperamide 2 MG capsule Commonly known as: IMODIUM Take 2 capsules (4 mg total) by mouth as needed for diarrhea or loose stools. Maximum of 8 capsules/day   omeprazole 20 MG capsule Commonly known as: PRILOSEC Take 1 capsule (20 mg total) by mouth daily.   ondansetron 8 MG  tablet Commonly known as: ZOFRAN Take 1 tablet (8 mg total) by mouth every 8 (eight) hours as needed for nausea or vomiting.   prochlorperazine 10 MG tablet Commonly known as: COMPAZINE Take 1 tablet (10 mg total) by mouth every 6 (six) hours as needed for nausea.        Acute coronary syndrome (MI, NSTEMI, STEMI, etc) this admission?:  No.  The elevated Troponin was due to the acute medical illness or demand ischemia.    Outstanding Labs/Studies   None  Duration of Discharge Encounter   Greater than 30 minutes including physician time.  Jarrett Soho, PA 01/03/2019, 12:15 PM

## 2019-01-05 MED FILL — Nitroglycerin IV Soln 100 MCG/ML in D5W: INTRA_ARTERIAL | Qty: 10 | Status: AC

## 2019-01-06 ENCOUNTER — Encounter (HOSPITAL_COMMUNITY): Payer: Self-pay | Admitting: Cardiovascular Disease

## 2019-01-07 ENCOUNTER — Telehealth: Payer: Self-pay | Admitting: *Deleted

## 2019-01-07 NOTE — Telephone Encounter (Signed)
Called requesting Dr. Benay Spice call him this week to discuss what happened after last chemo in regards to the hospitalization and how he felt in general. Is considering not having any further treatment.

## 2019-01-08 ENCOUNTER — Telehealth: Payer: Self-pay | Admitting: *Deleted

## 2019-01-08 NOTE — Telephone Encounter (Signed)
Left message that cardiology not able to see him until 01/20/2019. Dr. Benay Spice notified and said this is OK. Should still be able to treat him next week. If he needs to discuss further before next week, call back and he will call him again. Left this response on patient's voice mail.

## 2019-01-09 ENCOUNTER — Encounter: Payer: Self-pay | Admitting: *Deleted

## 2019-01-09 NOTE — Progress Notes (Signed)
Dr. Benay Spice spoke with patient again regarding his concerns about having next chemo cycle prior to his cardiology visit on 01/20/19. Scheduling message sent to move all appointments of 8/25 to 01/22/19. Dr. Benay Spice talked w/Dr. Stanford Breed and he agrees with continuing chemo. Currently unsure as to what has caused his heart failure. Cardiology will see him on 9/1 and adjust medications.

## 2019-01-13 ENCOUNTER — Ambulatory Visit: Payer: Medicaid Other

## 2019-01-13 ENCOUNTER — Ambulatory Visit: Payer: Medicaid Other | Admitting: Oncology

## 2019-01-13 ENCOUNTER — Telehealth: Payer: Self-pay | Admitting: Oncology

## 2019-01-13 ENCOUNTER — Other Ambulatory Visit: Payer: Medicaid Other

## 2019-01-13 NOTE — Telephone Encounter (Signed)
Called and confirmed Sept. Appt

## 2019-01-14 ENCOUNTER — Telehealth: Payer: Self-pay | Admitting: Nurse Practitioner

## 2019-01-14 NOTE — Telephone Encounter (Signed)
Called to f/u with patient - he is aware of appts moved from 8/27 to 9/03 -

## 2019-01-15 ENCOUNTER — Inpatient Hospital Stay: Payer: Medicaid Other

## 2019-01-15 ENCOUNTER — Other Ambulatory Visit: Payer: Medicaid Other

## 2019-01-15 ENCOUNTER — Ambulatory Visit: Payer: Medicaid Other | Admitting: Nurse Practitioner

## 2019-01-19 ENCOUNTER — Encounter: Payer: Self-pay | Admitting: *Deleted

## 2019-01-19 NOTE — Progress Notes (Addendum)
Cardiology Clinic Note   Patient Name: Derek Lloyd Date of Encounter: 01/20/2019  Primary Care Provider:  Patient, [name] Primary Cardiologist:  Kirk Ruths, MD  Patient Profile    Derek Lloyd 48 year old male presents to the clinic today for follow-up for his chest pain and dyspnea.  Past Medical History    Past Medical History:  Diagnosis Date  . Abdominal pain 09/2017  . Anxiety attack   . apendix ca dx'd 09/2018   with intra abd mets  . Headache    "frequently" (08/14/2017)  . Heart murmur    "when I was a kid" (08/14/2017)  . Small bowel obstruction ([place]) 08/13/2017   Past Surgical History:  Procedure Laterality Date  . BIOPSY  10/02/2017   Procedure: BIOPSY;  Surgeon: [name], MD;  Location: [facility] ENDOSCOPY;  Service: Gastroenterology;;  . COLONOSCOPY WITH PROPOFOL N/A 10/02/2017   Procedure: COLONOSCOPY WITH PROPOFOL;  Surgeon: [name], MD;  Location: [place];  Service: Gastroenterology;  Laterality: N/A;  . FINGER SURGERY Left    "thumb"  . IR IMAGING GUIDED PORT INSERTION  11/01/2017  . IR PARACENTESIS  09/24/2017  . IR US GUIDE VASC ACCESS RIGHT  11/01/2017  . LEFT HEART CATH AND CORONARY ANGIOGRAPHY N/A 01/02/2019   Procedure: LEFT HEART CATH AND CORONARY ANGIOGRAPHY;  Surgeon: Wellington Hampshire, MD;  Location: [place] CV LAB;  Service: Cardiovascular;  Laterality: N/A;  . POLYPECTOMY N/A 10/02/2017   Procedure: POLYPECTOMY;  Surgeon: [name], MD;  Location: [place];  Service: Gastroenterology;  Laterality: N/A;  . SUBMUCOSAL INJECTION  10/02/2017   Procedure: SUBMUCOSAL INJECTION;  Surgeon: [name], MD;  Location: [facility] ENDOSCOPY;  Service: Gastroenterology;;  . TYMPANOSTOMY TUBE PLACEMENT Bilateral     Allergies  Allergies  Allergen Reactions  . Piperacillin-Tazobactam In Dex Rash    Noted on May 2019 admission.  Tolerates Cephalosporins.    History of Present Illness    [name] was last seen by Dr. Stanford Breed on  01/03/2019.  He has metastatic appendiceal cancer status post chemotherapy and had been admitted to the hospital with a bout of atrial fibrillation vs SVT and diffuse ST elevation on 01/02/2019.  His cardiac catheterization showed no coronary artery disease and his echocardiogram showed severely reduced LVEF of 25 to 30%.  His left ventricular end-diastolic pressure was 5.  His PMH also includes SVT, unstable angina, atrial tachycardia, small bowel obstruction, primary appendiceal adenocarcinoma, and ascites.  He presents to the clinic today and states he is feeling physically much better.  He has not had any more episodes of heart racing or palpitations.  He states he has been eating better trying to gain weight with his chemotherapy. However, he thinks he has about 3 more months of chemotherapy and feels that this round is going to be more taxing due to the increased potency of the medication.  He states he did not take his carvedilol medication because he read the package insert and went online to research the medication and felt his blood pressure was already too low.  He states that he was told in the hospital that his blood pressure was low and he was confused as to why he should take another medication that would continue to lower his blood pressure.  He denies chest pain, shortness of breath, lower extremity edema, fatigue, palpitations, melena, hematuria, hemoptysis, diaphoresis, weakness, presyncope, syncope, orthopnea, and PND.   Home Medications    Prior to Admission medications   Medication Sig  Start Date End Date Taking? Authorizing Provider  ALPRAZolam Duanne Moron) 0.5 MG tablet Take 1 tablet (0.5 mg total) by mouth 2 (two) times daily as needed for anxiety. 12/31/18   Ladell Pier, MD  aspirin EC 81 MG tablet Take 1 tablet (81 mg total) by mouth daily. 01/03/19 01/03/20  Leanor Kail, PA  carvedilol (COREG) 3.125 MG tablet Take 1 tablet (3.125 mg total) by mouth 2 (two) times daily  with a meal. 01/03/19   Bhagat, Bhavinkumar, PA  loperamide (IMODIUM) 2 MG capsule Take 2 capsules (4 mg total) by mouth as needed for diarrhea or loose stools. Maximum of 8 capsules/day 01/03/19   Bhagat, Crista Luria, PA  omeprazole (PRILOSEC) 20 MG capsule Take 1 capsule (20 mg total) by mouth daily. 01/03/19   Bhagat, Crista Luria, PA  ondansetron (ZOFRAN) 8 MG tablet Take 1 tablet (8 mg total) by mouth every 8 (eight) hours as needed for nausea or vomiting. 01/03/19   Bhagat, Bhavinkumar, PA  prochlorperazine (COMPAZINE) 10 MG tablet Take 1 tablet (10 mg total) by mouth every 6 (six) hours as needed for nausea. 12/31/18   Garner Nash, DO    Family History    Family History  Problem Relation Age of Onset  . Prostate cancer Paternal Grandfather   . Crohn's disease Neg Hx    He indicated that his mother is deceased. He indicated that his father is deceased. He indicated that his maternal grandmother is deceased. He indicated that his maternal grandfather is deceased. He indicated that his paternal grandmother is deceased. He indicated that his paternal grandfather is deceased. He indicated that the status of his neg hx is unknown.  Social History    Social History   Socioeconomic History  . Marital status: Single    Spouse name: Not on file  . Number of children: Not on file  . Years of education: Not on file  . Highest education level: Not on file  Occupational History  . Not on file  Social Needs  . Financial resource strain: Not on file  . Food insecurity    Worry: Not on file    Inability: Not on file  . Transportation needs    Medical: Not on file    Non-medical: Not on file  Tobacco Use  . Smoking status: Current Some Day Smoker    Packs/day: 1.00    Types: Cigarettes    Last attempt to quit: 09/28/2016    Years since quitting: 2.3  . Smokeless tobacco: Never Used  . Tobacco comment: 08/14/2017 "I quit; can't tell you when I quit or when I started"  Substance and  Sexual Activity  . Alcohol use: Yes    Comment: 08/14/2017 "I drink when I feel like it; nothing recently"  . Drug use: Yes    Types: Marijuana  . Sexual activity: Not on file  Lifestyle  . Physical activity    Days per week: Not on file    Minutes per session: Not on file  . Stress: Not on file  Relationships  . Social Herbalist on phone: Not on file    Gets together: Not on file    Attends religious service: Not on file    Active member of club or organization: Not on file    Attends meetings of clubs or organizations: Not on file    Relationship status: Not on file  . Intimate partner violence    Fear of current or ex partner: Not on  file    Emotionally abused: Not on file    Physically abused: Not on file    Forced sexual activity: Not on file  Other Topics Concern  . Not on file  Social History Narrative  . Not on file     Review of Systems    General:  No chills, fever, night sweats or weight changes.  Cardiovascular:  No chest pain, dyspnea on exertion, edema, orthopnea, palpitations, paroxysmal nocturnal dyspnea. Dermatological: No rash, lesions/masses Respiratory: No cough, dyspnea Urologic: No hematuria, dysuria Abdominal:   No nausea, vomiting, diarrhea, bright red blood per rectum, melena, or hematemesis Neurologic:  No visual changes, wkns, changes in mental status. All other systems reviewed and are otherwise negative except as noted above.  Physical Exam    VS:  BP (!) 108/58   Pulse 98   Ht 5' 10"  (1.778 m)   Wt 109 lb (49.4 kg)   BMI 15.64 kg/m  , BMI Body mass index is 15.64 kg/m. GEN: Well nourished, well developed, in no acute distress. HEENT: normal. Neck: Supple, no JVD, carotid bruits, or masses. Cardiac: RRR, no murmurs, rubs, or gallops. No clubbing, cyanosis, edema.  Radials/DP/PT 2+ and equal bilaterally.  Respiratory:  Respirations regular and unlabored, clear to auscultation bilaterally. GI: Soft, nontender, nondistended,  BS + x 4. MS: no deformity or atrophy. Skin: warm and dry, no rash. Right radial catheterization site clean, dry, and intact. No drainage noted.  Neuro:  Strength and sensation are intact. Psych: Suspicious/ mild anger affect.  Accessory Clinical Findings    ECG personally reviewed by me today-normal sinus rhythm 98 bpm- No acute changes  Echocardiogram 01/02/2019  IMPRESSIONS    1. The left ventricle has severely reduced systolic function, with an ejection fraction of 20-25%. The cavity size was normal. Left ventricular diastolic function could not be evaluated due to indeterminate diastolic function. Elevated left ventricular  end-diastolic pressure.  2. The right ventricle has moderately reduced systolic function. The cavity was normal. There is no increase in right ventricular wall thickness. Right ventricular systolic pressure could not be assessed.  3. The aortic valve is grossly normal.  4. The aorta is normal unless otherwise noted.  5. Definity contrast agent was given IV to delineate the left ventricular endocardial borders. Possible early forming LV thrombus in the LV apex.  Cardiac catheterization 01/02/2019   There is moderate to severe left ventricular systolic dysfunction.  LV end diastolic pressure is normal.   1.  Normal coronary arteries. 2.  Moderately to severely reduced LV systolic function with an EF of 30 to 35%. 3.  Low left ventricular end-diastolic pressure.  Recommendations: I suspect that the patient had a early repolarization in his EKG in the setting of significant tachycardia.  He responded well to IV metoprolol and was in sinus rhythm during the procedure.  However, he developed hypotension after metoprolol and sedation.  He was given IV fluid boluses and was started on small dose norepinephrine drip.  He was hemodynamically stable by the end of the case. I suspect that the patient is volume depleted but he also has underlying cardiomyopathy which  is nonischemic.  I ordered an echocardiogram to reevaluate his ejection fraction given that left-ventricular angiography was performed when he was hypotensive.  Assessment & Plan   1.  Atrial fibrillation vs SVT-EKG today normal sinus rhythm 98 bpm.  He    presented to the ED after a new cycle of his FOLFIRI chemotherapy for his appendiceal carcinoma  with a atrial fib. Rate of 164.  Continue Aspirin 81 mg daily Start taking carvedilol 3.125 mg twice daily This patients CHA2DS2-VASc Score and unadjusted Ischemic Stroke Rate (% per year) is equal to 0.6 % stroke rate/year from a score of 1(CHF)   2.  Nonischemic cardiomyopathy- Activity tolerance has improved and he feels he is less SOB. Start taking carvedilol 3.125 milligrams BID- educated about benefits of medication/cardiomyopathy Continue Aspirin 81 mg daily Low sodium heart healthy diet. Increase physical activity as tolerated Heart healthy diet  3.  Metastatic appendiceal cancer-starting third round of chemotherapy this Thursday (01/22/2019).  May have 3 more rounds of chemotherapy over the next 3 months.   Followed by Oncology.  All medications reviewed and explained.  Patient expressed understanding and all questions were answered.  Disposition: Follow-up with APP in 1 month.  Deberah Pelton, NP-C 01/20/2019, 11:51 AM

## 2019-01-19 NOTE — Progress Notes (Signed)
Derek Lloyd  Patient contacted CSW requesting a letter stating his treatment status.  Patient plans to use letter to assist with community resources/housing.  CSW created letter and will leave at the front desk for patient to pick up.  Patient plans to pick letter up tomorrow 01/20/2019.  [name], MSW, LCSW, OSW-C Clinical Social Worker [facility] [phone]

## 2019-01-20 ENCOUNTER — Encounter: Payer: Self-pay | Admitting: Adult Health

## 2019-01-20 ENCOUNTER — Ambulatory Visit (INDEPENDENT_AMBULATORY_CARE_PROVIDER_SITE_OTHER): Payer: Medicaid Other | Admitting: General Practice

## 2019-01-20 ENCOUNTER — Other Ambulatory Visit: Payer: Self-pay

## 2019-01-20 VITALS — BP 108/58 | HR 98 | Ht 70.0 in | Wt 109.0 lb

## 2019-01-20 DIAGNOSIS — I428 Other cardiomyopathies: Secondary | ICD-10-CM

## 2019-01-20 DIAGNOSIS — I480 Paroxysmal atrial fibrillation: Secondary | ICD-10-CM

## 2019-01-20 DIAGNOSIS — C799 Secondary malignant neoplasm of unspecified site: Secondary | ICD-10-CM

## 2019-01-20 NOTE — Patient Instructions (Addendum)
Medication Instructions:  Continue current medications  If you need a refill on your cardiac medications before your next appointment, please call your pharmacy.  Labwork: None Ordered   Testing/Procedures: None ordered  Follow-Up: . Your physician recommends that you schedule a follow-up appointment in: 1 Month October 6th @ 10:15 am   At [facility], you and your health needs are our priority.  As part of our continuing mission to provide you with exceptional heart care, we have created designated Provider Care Teams.  These Care Teams include your primary Cardiologist (physician) and Advanced Practice Providers (APPs -  Physician Assistants and Nurse Practitioners) who all work together to provide you with the care you need, when you need it.  Thank you for choosing [facility]!!

## 2019-01-21 ENCOUNTER — Telehealth: Payer: Self-pay | Admitting: *Deleted

## 2019-01-21 NOTE — Telephone Encounter (Signed)
Called to inquire about appointment tomorrow and what will happen: He was informed that Dr. Benay Lloyd plans on giving him the chemotherapy. He and Dr. Stanford Lloyd have spoken and do not feel the chemo caused his cardiac issue. He is concerned about getting dehydrated again--instructed him to drink water and Gatoraid or any other sport drink he enjoys. Can always give him a liter of fluid when he is here for pump d/c on Saturday. He would like to do this. Also wanted MD aware he will need a refill on his xanax before next treatment and he is interested in trying marinol for his appetite. Informed him with medicaid, he may be required to try a couple other options before they will pay for marinol. Will discuss these issues with him tomorrow.

## 2019-01-22 ENCOUNTER — Inpatient Hospital Stay: Payer: Medicaid Other

## 2019-01-22 ENCOUNTER — Other Ambulatory Visit: Payer: Self-pay

## 2019-01-22 ENCOUNTER — Inpatient Hospital Stay (HOSPITAL_BASED_OUTPATIENT_CLINIC_OR_DEPARTMENT_OTHER): Payer: Medicaid Other | Admitting: Nurse Practitioner

## 2019-01-22 ENCOUNTER — Inpatient Hospital Stay: Payer: Medicaid Other | Attending: Oncology

## 2019-01-22 ENCOUNTER — Encounter: Payer: Self-pay | Admitting: Nurse Practitioner

## 2019-01-22 VITALS — BP 90/65 | HR 88 | Temp 99.1°F | Resp 18 | Ht 70.0 in | Wt 109.5 lb

## 2019-01-22 DIAGNOSIS — Z5111 Encounter for antineoplastic chemotherapy: Secondary | ICD-10-CM | POA: Diagnosis not present

## 2019-01-22 DIAGNOSIS — C799 Secondary malignant neoplasm of unspecified site: Secondary | ICD-10-CM

## 2019-01-22 DIAGNOSIS — Z95828 Presence of other vascular implants and grafts: Secondary | ICD-10-CM

## 2019-01-22 DIAGNOSIS — R079 Chest pain, unspecified: Secondary | ICD-10-CM | POA: Insufficient documentation

## 2019-01-22 DIAGNOSIS — Z79899 Other long term (current) drug therapy: Secondary | ICD-10-CM | POA: Insufficient documentation

## 2019-01-22 DIAGNOSIS — C786 Secondary malignant neoplasm of retroperitoneum and peritoneum: Secondary | ICD-10-CM | POA: Insufficient documentation

## 2019-01-22 DIAGNOSIS — C787 Secondary malignant neoplasm of liver and intrahepatic bile duct: Secondary | ICD-10-CM | POA: Diagnosis not present

## 2019-01-22 DIAGNOSIS — R531 Weakness: Secondary | ICD-10-CM | POA: Insufficient documentation

## 2019-01-22 DIAGNOSIS — C181 Malignant neoplasm of appendix: Secondary | ICD-10-CM

## 2019-01-22 DIAGNOSIS — R1900 Intra-abdominal and pelvic swelling, mass and lump, unspecified site: Secondary | ICD-10-CM | POA: Diagnosis not present

## 2019-01-22 DIAGNOSIS — R109 Unspecified abdominal pain: Secondary | ICD-10-CM | POA: Diagnosis not present

## 2019-01-22 DIAGNOSIS — K5900 Constipation, unspecified: Secondary | ICD-10-CM | POA: Diagnosis not present

## 2019-01-22 DIAGNOSIS — J90 Pleural effusion, not elsewhere classified: Secondary | ICD-10-CM | POA: Diagnosis not present

## 2019-01-22 DIAGNOSIS — I4891 Unspecified atrial fibrillation: Secondary | ICD-10-CM | POA: Diagnosis not present

## 2019-01-22 DIAGNOSIS — Z23 Encounter for immunization: Secondary | ICD-10-CM | POA: Insufficient documentation

## 2019-01-22 DIAGNOSIS — R110 Nausea: Secondary | ICD-10-CM | POA: Diagnosis not present

## 2019-01-22 DIAGNOSIS — Z9081 Acquired absence of spleen: Secondary | ICD-10-CM | POA: Insufficient documentation

## 2019-01-22 DIAGNOSIS — R600 Localized edema: Secondary | ICD-10-CM | POA: Diagnosis not present

## 2019-01-22 LAB — CBC WITH DIFFERENTIAL (CANCER CENTER ONLY)
Abs Immature Granulocytes: 0.03 10*3/uL (ref 0.00–0.07)
Basophils Absolute: 0.1 10*3/uL (ref 0.0–0.1)
Basophils Relative: 1 %
Eosinophils Absolute: 0.2 10*3/uL (ref 0.0–0.5)
Eosinophils Relative: 2 %
HCT: 40.4 % (ref 39.0–52.0)
Hemoglobin: 13.5 g/dL (ref 13.0–17.0)
Immature Granulocytes: 0 %
Lymphocytes Relative: 26 %
Lymphs Abs: 2.6 10*3/uL (ref 0.7–4.0)
MCH: 31.2 pg (ref 26.0–34.0)
MCHC: 33.4 g/dL (ref 30.0–36.0)
MCV: 93.3 fL (ref 80.0–100.0)
Monocytes Absolute: 1.5 10*3/uL — ABNORMAL HIGH (ref 0.1–1.0)
Monocytes Relative: 15 %
Neutro Abs: 5.6 10*3/uL (ref 1.7–7.7)
Neutrophils Relative %: 56 %
Platelet Count: 567 10*3/uL — ABNORMAL HIGH (ref 150–400)
RBC: 4.33 MIL/uL (ref 4.22–5.81)
RDW: 14.9 % (ref 11.5–15.5)
WBC Count: 10 10*3/uL (ref 4.0–10.5)
nRBC: 0 % (ref 0.0–0.2)

## 2019-01-22 LAB — CMP (CANCER CENTER ONLY)
ALT: 25 U/L (ref 0–44)
AST: 24 U/L (ref 15–41)
Albumin: 3.6 g/dL (ref 3.5–5.0)
Alkaline Phosphatase: 116 U/L (ref 38–126)
Anion gap: 9 (ref 5–15)
BUN: 9 mg/dL (ref 6–20)
CO2: 25 mmol/L (ref 22–32)
Calcium: 9.1 mg/dL (ref 8.9–10.3)
Chloride: 107 mmol/L (ref 98–111)
Creatinine: 0.8 mg/dL (ref 0.61–1.24)
GFR, Est AFR Am: >60 mL/min (ref >60)
GFR, Estimated: >60 mL/min (ref >60)
Glucose, Bld: 84 mg/dL (ref 70–99)
Potassium: 4.2 mmol/L (ref 3.5–5.1)
Sodium: 141 mmol/L (ref 135–145)
Total Bilirubin: 0.6 mg/dL (ref 0.3–1.2)
Total Protein: 7 g/dL (ref 6.5–8.1)

## 2019-01-22 MED ORDER — HEPARIN SOD (PORK) LOCK FLUSH 100 UNIT/ML IV SOLN
500.0000 [IU] | Freq: Once | INTRAVENOUS | Status: AC
  Administered 2019-01-22: 500 [IU]
  Filled 2019-01-22: qty 5

## 2019-01-22 MED ORDER — SODIUM CHLORIDE 0.9% FLUSH
10.0000 mL | Freq: Once | INTRAVENOUS | Status: AC
  Administered 2019-01-22: 11:00:00 10 mL
  Filled 2019-01-22: qty 10

## 2019-01-22 MED ORDER — SODIUM CHLORIDE 0.9% FLUSH
10.0000 mL | Freq: Once | INTRAVENOUS | Status: AC
  Administered 2019-01-22: 10 mL
  Filled 2019-01-22: qty 10

## 2019-01-22 NOTE — Progress Notes (Addendum)
[place] OFFICE PROGRESS NOTE   Diagnosis:  Appendiceal carcinoma  INTERVAL Derek:   Derek Lloyd returns as scheduled.  He completed cycle 1 FOLFIRI 12/31/2018.  He was hospitalized 01/02/2019 with chest pain.  Initial EKG showed atrial fibrillation with a rate of 164 and possible ST elevations in the inferior and lateral leads.  Echo showed the left ventricle with severely reduced systolic function with an ejection fraction of 20 to 25%.  Catheterization with moderate to severe left ventricular systolic dysfunction, low left ventricular end-diastolic pressure.  Cardiology suspected he was volume depleted but also has underlying cardiomyopathy which is nonischemic.  He was started on Coreg.  He was discharged home 01/03/2019.  He has had no further episodes of chest pain.  No nausea or vomiting.  No mouth sores.  No diarrhea.  He recounts a similar episode of chest pain when he was taking FOLFOX chemotherapy.  Objective:  Vital signs in last 24 hours:  Blood pressure 90/65, pulse 88, temperature 99.1 F (37.3 C), temperature source Oral, resp. rate 18, height 5' 10"  (1.778 m), weight 109 lb 8 oz (49.7 kg), SpO2 98 %.    HEENT: No thrush or ulcers. Resp: Lungs clear bilaterally. Cardio: Regular rate and rhythm. GI: Abdomen soft and nontender.  No hepatomegaly. Vascular: No leg edema. Neuro: Alert and oriented. Port-A-Cath without erythema.   Lab Results:  Lab Results  Component Value Date   WBC 10.0 01/22/2019   HGB 13.5 01/22/2019   HCT 40.4 01/22/2019   MCV 93.3 01/22/2019   PLT 567 (H) 01/22/2019   NEUTROABS 5.6 01/22/2019    Imaging:  No results found.  Medications: I have reviewed the patient's current medications.  Assessment/Plan: 1. Metastatic adenocarcinoma of the appendix ? 10/02/2017 biopsy of an appendiceal orifice "polyp"revealed invasive adenocarcinoma ? CT abdomen/pelvis 09/26/2017 with evidence of carcinomatosis-omental caking and  ascites ? Colonoscopy 10/02/2017 with multiple polyps-tubular adenomas ? Paracentesis 09/24/2017, 09/28/2017-no malignant cells identified ? Paracentesis 11/01/2017-malignant cells consistent with carcinoma ? Biopsy of left peritoneal mass 11/01/2017-poorly differentiated carcinoma ? CT chest 11/01/2017-? Small pleural nodules versus atelectasis, no other evidence of metastatic disease in the chest ? Cycle 1 FOLFOX 11/04/2017 ? Cycle 2 FOLFOX 11/18/2017 ? Cycle 3 FOLFOX 12/02/2017 ? Cycle 4 FOLFOX 12/17/2017 ? Cycle 5 FOLFOX 12/30/2017 ? Cycle 6 FOLFOX 01/14/2018 ? [facility] at [facility] 01/15/2018-improved abdominal ascites. Similar peritoneal metastatic burden with more discrete measurable peritoneal disease adjacent to the splenic flexure of the colon. Findings of diffuse bowel serosal metastatic involvement. ? 02/24/2018-laparotomy with right diaphragm peritonectomy, splenectomy, right colectomy, sigmoid colectomy, hyperthermic intraperitoneal chemotherapy mitomycin-C, stent placement;R1 resection(invasive adenocarcinoma, poorly differentiated, arising from the appendix; carcinoma invades to the wall of the appendix, into the surrounding adipose tissue and directly invades into the wall of the terminal ileum. Perineural invasion present. Total of 22 benign lymph nodes. Proximal distal margins free of tumor. Metastatic adenocarcinoma to the omentum. Benign spleen. Left gutter stripping metastatic adenocarcinoma; metastatic adenocarcinoma to the peritoneum;right diaphragm stripping metastatic adenocarcinoma; debrided tumor metastatic adenocarcinoma to the peritoneum;round ligament of liver metastatic adenocarcinoma). ? CTs 12/05/2018-new bilateral pleural effusions, moderate on the right and small on the left. Extensive nodularity along the pleural fissures on both sides and also along the medial pleura on the right. No recurrent omental disease. Progressive appearing ill defined infiltrating  soft tissue mass in the pelvis along the rectosigmoid junction just below the anastomosis. ? Thoracentesis 12/17/2018-reactive mesothelial cells and acute inflammation ? 12/26/2018 flexible sigmoidoscopy- tortuous  and anatomically abnormal left colon without any luminal masses, tumors.  Sigmoid anastomosis noted about 20 cm from the anus associated with significant mucosal edema but not obvious tumors. ? Cycle 1 FOLFIRI 12/31/2018  2.Abdominal pain and bloating secondary to #1-improved  3.Port-A-Cath placement 11/01/2017  4.Nausea following cycle 1 FOLFOX-Emend and Decadron prophylaxis added with cycle 2  5.  Hospitalization 01/02/2019 through 01/03/2019 presenting with chest pain, EKG with atrial fibrillation rate 164; Echo showed left ventricle with severely reduced systolic function with an ejection fraction of 20 to 25%.  Catheterization with moderate to severe left ventricular systolic dysfunction, low left ventricular end-diastolic pressure.  On Coreg.  Now followed by Dr. Stanford Breed.   Disposition: Derek Lloyd appears stable.  He has completed 1 cycle of FOLFIRI.  He developed chest pain on day 3, EKG showed atrial fibrillation with rate 164.  Echo with ejection fraction 20 to 25%; catheterization with moderate to severe left ventricular systolic dysfunction.  Cardiology suspected underlying nonischemic cardiomyopathy.  Derek Lloyd reports no further episodes of chest pain.  He does recount a similar episode while he was receiving FOLFOX chemotherapy.  We discussed the possibility of coronary vasospasm associated with 5-fluorouracil.  We are holding today's treatment.  We will submit guardant testing.  He will return for a follow-up visit in 2 weeks.  He will contact the office in the interim with any problems.  Patient seen with Dr. Benay Spice.  25 minutes were spent face-to-face at today's visit with the majority of that time involved in counseling/coordination of care.    Ned Card ANP/GNP-BC   01/22/2019  11:23 AM This was a shared visit with Ned Card.  Derek Lloyd appears well.  He was admitted with chest pain and tachycardia after the first cycle of FOLFIRI.  He had similar symptoms following a previous cycle of FOLFOX.  I discussed the case with cardiology last week.  He was found to have significant nonischemic cardiomyopathy.  The etiology of the cardiomyopathy clear.  It is possible chest pain and tachycardia were related to 5-FU induced ischemia.  We decided to hold FOLFIRI today.  We will submit peripheral blood for Guardant 360 testing.  Julieanne Manson, MD

## 2019-01-24 ENCOUNTER — Inpatient Hospital Stay: Payer: Medicaid Other

## 2019-01-27 ENCOUNTER — Other Ambulatory Visit: Payer: Self-pay | Admitting: Nurse Practitioner

## 2019-01-27 ENCOUNTER — Telehealth: Payer: Self-pay | Admitting: *Deleted

## 2019-01-27 DIAGNOSIS — C181 Malignant neoplasm of appendix: Secondary | ICD-10-CM

## 2019-01-27 MED ORDER — ALPRAZOLAM 0.5 MG PO TABS: 0.5000 mg | ORAL_TABLET | Freq: Two times a day (BID) | ORAL | 0 refills | Status: DC | PRN

## 2019-01-27 NOTE — Telephone Encounter (Signed)
Requesting refill on his xanax. Called back and notified him that refill has been sent.

## 2019-01-28 ENCOUNTER — Inpatient Hospital Stay: Payer: Medicaid Other | Admitting: Oncology

## 2019-01-28 ENCOUNTER — Other Ambulatory Visit: Payer: Self-pay | Admitting: *Deleted

## 2019-01-28 ENCOUNTER — Inpatient Hospital Stay: Payer: Medicaid Other

## 2019-01-28 DIAGNOSIS — C181 Malignant neoplasm of appendix: Secondary | ICD-10-CM

## 2019-01-28 MED ORDER — ALPRAZOLAM 0.5 MG PO TABS: 0.5000 mg | ORAL_TABLET | Freq: Two times a day (BID) | ORAL | 0 refills | Status: DC | PRN

## 2019-01-28 NOTE — Progress Notes (Signed)
Patient reports xanax sent to [facility]. He has no transportation there and needs it sent to [facility]. Called [facility] to cancel and called script to [facility] as requested.

## 2019-01-30 ENCOUNTER — Inpatient Hospital Stay: Payer: Medicaid Other

## 2019-02-06 ENCOUNTER — Telehealth: Payer: Self-pay | Admitting: *Deleted

## 2019-02-06 NOTE — Telephone Encounter (Signed)
Patient called very anxiously needing to know what is happening on Monday's appointments. Says he needs to prepare for his appointments here and know ahead of time what the plan is. Informed him he is being seen by MD for f/u to see how he is doing and that no treatment is planned that day. Also expressed concern that he has not heard from transportation yet and he does not want to miss his ride, because he has been put on warning that he will no longer be provided transportation if he does not show up for driver. He reports feeling more pain and thinks his cancer is growing. Informed him that the nurse will contact transportation to ensure he is lined up for Monday for his 0800 appointment. He is also asking to schedule future appointments later in the day. Tells RN that for a 0800 appointment he has to get up at ~ 1 am to prepare himself physically and mentally.

## 2019-02-09 ENCOUNTER — Inpatient Hospital Stay (HOSPITAL_BASED_OUTPATIENT_CLINIC_OR_DEPARTMENT_OTHER): Payer: Medicaid Other | Admitting: Oncology

## 2019-02-09 ENCOUNTER — Other Ambulatory Visit: Payer: Self-pay

## 2019-02-09 VITALS — BP 110/69 | HR 75 | Temp 98.9°F | Resp 17 | Ht 70.0 in | Wt 112.1 lb

## 2019-02-09 DIAGNOSIS — Z5111 Encounter for antineoplastic chemotherapy: Secondary | ICD-10-CM | POA: Diagnosis not present

## 2019-02-09 DIAGNOSIS — C181 Malignant neoplasm of appendix: Secondary | ICD-10-CM | POA: Diagnosis not present

## 2019-02-09 MED ORDER — ASPIRIN EC 81 MG PO TBEC: 81.0000 mg | DELAYED_RELEASE_TABLET | Freq: Every day | ORAL | 3 refills | Status: DC

## 2019-02-09 NOTE — Progress Notes (Signed)
[facility] OFFICE PROGRESS NOTE   Diagnosis: Appendiceal cancer  INTERVAL HISTORY:   Derek Lloyd returns as scheduled.  He continues to have discomfort in the left lower abdomen.  He is having bowel movements.  He reports [facility] has submitted molecular testing on the resected tumor.  Objective:  Vital signs in last 24 hours:  Blood pressure 110/69, pulse 75, temperature 98.9 F (37.2 C), temperature source Oral, resp. rate 17, height 5' 10"  (1.778 m), weight 112 lb 1.6 oz (50.8 kg), SpO2 100 %.     Resp: Good air movement bilaterally, end inspiratory rhonchi at the posterior chest, no dullness of the posterior chest Cardio: Regular rate and rhythm GI: No hepatosplenomegaly, tender in the left lower abdomen, no mass Vascular: No leg edema   Portacath/PICC-without erythema  Lab Results:  Lab Results  Component Value Date   WBC 10.0 01/22/2019   HGB 13.5 01/22/2019   HCT 40.4 01/22/2019   MCV 93.3 01/22/2019   PLT 567 (H) 01/22/2019   NEUTROABS 5.6 01/22/2019    CMP  Lab Results  Component Value Date   NA 141 01/22/2019   K 4.2 01/22/2019   CL 107 01/22/2019   CO2 25 01/22/2019   GLUCOSE 84 01/22/2019   BUN 9 01/22/2019   CREATININE 0.80 01/22/2019   CALCIUM 9.1 01/22/2019   PROT 7.0 01/22/2019   ALBUMIN 3.6 01/22/2019   AST 24 01/22/2019   ALT 25 01/22/2019   ALKPHOS 116 01/22/2019   BILITOT 0.6 01/22/2019   GFRNONAA >60 01/22/2019   GFRAA >60 01/22/2019    Lab Results  Component Value Date   CEA1 4.19 12/31/2018     Medications: I have reviewed the patient's current medications.   Assessment/Plan: 1. Metastatic adenocarcinoma of the appendix ? 10/02/2017 biopsy of an appendiceal orifice "polyp"revealed invasive adenocarcinoma ? CT abdomen/pelvis 09/26/2017 with evidence of carcinomatosis-omental caking and ascites ? Colonoscopy 10/02/2017 with multiple polyps-tubular adenomas ? Paracentesis 09/24/2017, 09/28/2017-no malignant  cells identified ? Paracentesis 11/01/2017-malignant cells consistent with carcinoma ? Biopsy of left peritoneal mass 11/01/2017-poorly differentiated carcinoma ? CT chest 11/01/2017-? Small pleural nodules versus atelectasis, no other evidence of metastatic disease in the chest ? Cycle 1 FOLFOX 11/04/2017 ? Cycle 2 FOLFOX 11/18/2017 ? Cycle 3 FOLFOX 12/02/2017 ? Cycle 4 FOLFOX 12/17/2017 ? Cycle 5 FOLFOX 12/30/2017 ? Cycle 6 FOLFOX 01/14/2018 ? [facility] at [facility] 01/15/2018-improved abdominal ascites. Similar peritoneal metastatic burden with more discrete measurable peritoneal disease adjacent to the splenic flexure of the colon. Findings of diffuse bowel serosal metastatic involvement. ? 02/24/2018-laparotomy with right diaphragm peritonectomy, splenectomy, right colectomy, sigmoid colectomy, hyperthermic intraperitoneal chemotherapy mitomycin-C, stent placement;R1 resection(invasive adenocarcinoma, poorly differentiated, arising from the appendix; carcinoma invades to the wall of the appendix, into the surrounding adipose tissue and directly invades into the wall of the terminal ileum. Perineural invasion present. Total of 22 benign lymph nodes. Proximal distal margins free of tumor. Metastatic adenocarcinoma to the omentum. Benign spleen. Left gutter stripping metastatic adenocarcinoma; metastatic adenocarcinoma to the peritoneum;right diaphragm stripping metastatic adenocarcinoma; debrided tumor metastatic adenocarcinoma to the peritoneum;round ligament of liver metastatic adenocarcinoma). ? CTs 12/05/2018-new bilateral pleural effusions, moderate on the right and small on the left. Extensive nodularity along the pleural fissures on both sides and also along the medial pleura on the right. No recurrent omental disease. Progressive appearing ill defined infiltrating soft tissue mass in the pelvis along the rectosigmoid junction just below the anastomosis. ? Thoracentesis  12/17/2018-reactive mesothelial cells and acute inflammation ?  12/26/2018 flexible sigmoidoscopy- tortuous and anatomically abnormal left colon without any luminal masses, tumors.  Sigmoid anastomosis noted about 20 cm from the anus associated with significant mucosal edema but not obvious tumors. ? Cycle 1 FOLFIRI 12/31/2018  2.Abdominal pain and bloating secondary to #1-improved  3.Port-A-Cath placement 11/01/2017  4.Nausea following cycle 1 FOLFOX-Emend and Decadron prophylaxis added with cycle 2  5.  Hospitalization 01/02/2019 through 01/03/2019 presenting with chest pain, EKG with atrial fibrillation rate 164; Echo showed left ventricle with severely reduced systolic function with an ejection fraction of 20 to 25%.  Catheterization with moderate to severe left ventricular systolic dysfunction, low left ventricular end-diastolic pressure.  On Coreg.  Now followed by Dr. Stanford Breed.   Disposition: Derek Lloyd appears stable.  We submitted peripheral blood for Guardant 360 testing on 01/22/2019.  No alterations were detected, potentially related to low levels of circulating tumor DNA.  We will follow-up on results from Foundation 1 testing at [facility].  If they have not submitted testing on the resected tumor we will request this.  I discussed treatment options with Derek Lloyd.  The etiology of the chest pain and reduced ejection fraction is unclear, but I learned this could be related to 5-fluorouracil.  He reports similar symptoms when he received FOLFOX last year.  We decided to proceed with a cycle of single agent irinotecan next week.  It is more likely cardiac toxicity could be associated with 5-fluorouracil as compared to irinotecan.  He will be scheduled for an office visit and irinotecan in 1 week.  Betsy Coder, MD  02/09/2019  9:09 AM

## 2019-02-10 ENCOUNTER — Telehealth: Payer: Self-pay | Admitting: Oncology

## 2019-02-10 NOTE — Telephone Encounter (Signed)
Called and spoke with patient. Confirmed appt  °

## 2019-02-16 ENCOUNTER — Other Ambulatory Visit: Payer: Self-pay | Admitting: *Deleted

## 2019-02-16 DIAGNOSIS — Z23 Encounter for immunization: Secondary | ICD-10-CM

## 2019-02-17 ENCOUNTER — Inpatient Hospital Stay: Payer: Medicaid Other

## 2019-02-17 ENCOUNTER — Other Ambulatory Visit: Payer: Self-pay

## 2019-02-17 ENCOUNTER — Inpatient Hospital Stay (HOSPITAL_BASED_OUTPATIENT_CLINIC_OR_DEPARTMENT_OTHER): Payer: Medicaid Other | Admitting: Nurse Practitioner

## 2019-02-17 ENCOUNTER — Encounter: Payer: Self-pay | Admitting: Nurse Practitioner

## 2019-02-17 VITALS — BP 93/61 | HR 78 | Temp 98.9°F | Resp 17 | Ht 70.0 in | Wt 108.9 lb

## 2019-02-17 DIAGNOSIS — C181 Malignant neoplasm of appendix: Secondary | ICD-10-CM

## 2019-02-17 DIAGNOSIS — C799 Secondary malignant neoplasm of unspecified site: Secondary | ICD-10-CM

## 2019-02-17 DIAGNOSIS — Z23 Encounter for immunization: Secondary | ICD-10-CM

## 2019-02-17 DIAGNOSIS — Z5111 Encounter for antineoplastic chemotherapy: Secondary | ICD-10-CM | POA: Diagnosis not present

## 2019-02-17 DIAGNOSIS — Z95828 Presence of other vascular implants and grafts: Secondary | ICD-10-CM

## 2019-02-17 LAB — CMP (CANCER CENTER ONLY)
ALT: 29 U/L (ref 0–44)
AST: 26 U/L (ref 15–41)
Albumin: 3.7 g/dL (ref 3.5–5.0)
Alkaline Phosphatase: 143 U/L — ABNORMAL HIGH (ref 38–126)
Anion gap: 11 (ref 5–15)
BUN: 8 mg/dL (ref 6–20)
CO2: 24 mmol/L (ref 22–32)
Calcium: 9.6 mg/dL (ref 8.9–10.3)
Chloride: 106 mmol/L (ref 98–111)
Creatinine: 0.74 mg/dL (ref 0.61–1.24)
GFR, Est AFR Am: >60 mL/min (ref >60)
GFR, Estimated: >60 mL/min (ref >60)
Glucose, Bld: 89 mg/dL (ref 70–99)
Potassium: 4.2 mmol/L (ref 3.5–5.1)
Sodium: 141 mmol/L (ref 135–145)
Total Bilirubin: 0.8 mg/dL (ref 0.3–1.2)
Total Protein: 7.5 g/dL (ref 6.5–8.1)

## 2019-02-17 LAB — CBC WITH DIFFERENTIAL (CANCER CENTER ONLY)
Abs Immature Granulocytes: 0.04 10*3/uL (ref 0.00–0.07)
Basophils Absolute: 0.1 10*3/uL (ref 0.0–0.1)
Basophils Relative: 1 %
Eosinophils Absolute: 0.3 10*3/uL (ref 0.0–0.5)
Eosinophils Relative: 2 %
HCT: 42.2 % (ref 39.0–52.0)
Hemoglobin: 14.1 g/dL (ref 13.0–17.0)
Immature Granulocytes: 0 %
Lymphocytes Relative: 21 %
Lymphs Abs: 2.6 10*3/uL (ref 0.7–4.0)
MCH: 30.9 pg (ref 26.0–34.0)
MCHC: 33.4 g/dL (ref 30.0–36.0)
MCV: 92.3 fL (ref 80.0–100.0)
Monocytes Absolute: 1.4 10*3/uL — ABNORMAL HIGH (ref 0.1–1.0)
Monocytes Relative: 11 %
Neutro Abs: 8 10*3/uL — ABNORMAL HIGH (ref 1.7–7.7)
Neutrophils Relative %: 65 %
Platelet Count: 658 10*3/uL — ABNORMAL HIGH (ref 150–400)
RBC: 4.57 MIL/uL (ref 4.22–5.81)
RDW: 15 % (ref 11.5–15.5)
WBC Count: 12.4 10*3/uL — ABNORMAL HIGH (ref 4.0–10.5)
nRBC: 0 % (ref 0.0–0.2)

## 2019-02-17 LAB — CEA (IN HOUSE-CHCC): CEA (CHCC-In House): 3.7 ng/mL (ref 0.00–5.00)

## 2019-02-17 LAB — MAGNESIUM: Magnesium: 1.7 mg/dL (ref 1.7–2.4)

## 2019-02-17 MED ORDER — PALONOSETRON HCL INJECTION 0.25 MG/5ML
0.2500 mg | Freq: Once | INTRAVENOUS | Status: AC
  Administered 2019-02-17: 0.25 mg via INTRAVENOUS

## 2019-02-17 MED ORDER — SODIUM CHLORIDE 0.9% FLUSH
10.0000 mL | INTRAVENOUS | Status: DC | PRN
  Administered 2019-02-17: 14:00:00 10 mL
  Filled 2019-02-17: qty 10

## 2019-02-17 MED ORDER — FAMOTIDINE IN NACL 20-0.9 MG/50ML-% IV SOLN: 20.0000 mg | Freq: Once | INTRAVENOUS | Status: DC

## 2019-02-17 MED ORDER — INFLUENZA VAC SPLIT QUAD 0.5 ML IM SUSY
0.5000 mL | PREFILLED_SYRINGE | Freq: Once | INTRAMUSCULAR | Status: AC
  Administered 2019-02-17: 11:00:00 0.5 mL via INTRAMUSCULAR

## 2019-02-17 MED ORDER — SODIUM CHLORIDE 0.9% FLUSH
10.0000 mL | Freq: Once | INTRAVENOUS | Status: AC
  Administered 2019-02-17: 09:00:00 10 mL
  Filled 2019-02-17: qty 10

## 2019-02-17 MED ORDER — ATROPINE SULFATE 1 MG/ML IJ SOLN
0.5000 mg | Freq: Once | INTRAMUSCULAR | Status: AC | PRN
  Administered 2019-02-17: 0.5 mg via INTRAVENOUS

## 2019-02-17 MED ORDER — HEPARIN SOD (PORK) LOCK FLUSH 100 UNIT/ML IV SOLN
500.0000 [IU] | Freq: Once | INTRAVENOUS | Status: AC | PRN
  Administered 2019-02-17: 14:00:00 500 [IU]
  Filled 2019-02-17: qty 5

## 2019-02-17 MED ORDER — PALONOSETRON HCL INJECTION 0.25 MG/5ML
INTRAVENOUS | Status: AC
  Filled 2019-02-17: qty 5

## 2019-02-17 MED ORDER — DEXAMETHASONE SODIUM PHOSPHATE 10 MG/ML IJ SOLN
10.0000 mg | Freq: Once | INTRAMUSCULAR | Status: AC
  Administered 2019-02-17: 10:00:00 10 mg via INTRAVENOUS

## 2019-02-17 MED ORDER — FAMOTIDINE IN NACL 20-0.9 MG/50ML-% IV SOLN
INTRAVENOUS | Status: AC
  Filled 2019-02-17: qty 50

## 2019-02-17 MED ORDER — INFLUENZA VAC SPLIT QUAD 0.5 ML IM SUSY
PREFILLED_SYRINGE | INTRAMUSCULAR | Status: AC
  Filled 2019-02-17: qty 0.5

## 2019-02-17 MED ORDER — IRINOTECAN HCL CHEMO INJECTION 100 MG/5ML
180.0000 mg/m2 | Freq: Once | INTRAVENOUS | Status: AC
  Administered 2019-02-17: 280 mg via INTRAVENOUS
  Filled 2019-02-17: qty 14

## 2019-02-17 MED ORDER — SODIUM CHLORIDE 0.9 % IV SOLN
Freq: Once | INTRAVENOUS | Status: AC
  Administered 2019-02-17: 10:00:00 via INTRAVENOUS
  Filled 2019-02-17: qty 250

## 2019-02-17 MED ORDER — DEXAMETHASONE SODIUM PHOSPHATE 10 MG/ML IJ SOLN
INTRAMUSCULAR | Status: AC
  Filled 2019-02-17: qty 1

## 2019-02-17 MED ORDER — ATROPINE SULFATE 1 MG/ML IJ SOLN
INTRAMUSCULAR | Status: AC
  Filled 2019-02-17: qty 1

## 2019-02-17 MED ORDER — FAMOTIDINE IN NACL 20-0.9 MG/50ML-% IV SOLN
20.0000 mg | Freq: Once | INTRAVENOUS | Status: AC
  Administered 2019-02-17: 11:00:00 20 mg via INTRAVENOUS

## 2019-02-17 NOTE — Progress Notes (Addendum)
[place] OFFICE PROGRESS NOTE   Diagnosis: Appendiceal cancer  INTERVAL HISTORY:   Derek Lloyd returns as scheduled.  Appetite varies.  He was constipated up until a few days ago.  He discontinued Prilosec and Xanax and wonders if 1 of these was causing the constipation.  He had a bowel movement yesterday and today.  He has nausea with bowel movements, also crampy abdominal pain.  At present he is not taking a laxative or stool softener.  After showering he notes weakness at his "hips".   Objective:  Vital signs in last 24 hours:  Blood pressure 93/61, pulse 78, temperature 98.9 F (37.2 C), temperature source Oral, resp. rate 17, height 5' 10"  (1.778 m), weight 108 lb 14.4 oz (49.4 kg), SpO2 100 %.    GI: Abdomen soft and nontender.  No hepatomegaly. Vascular: No leg edema. Neuro: Alert and oriented. Skin: Palms without erythema. Port-A-Cath without erythema.   Lab Results:  Lab Results  Component Value Date   WBC 12.4 (H) 02/17/2019   HGB 14.1 02/17/2019   HCT 42.2 02/17/2019   MCV 92.3 02/17/2019   PLT 658 (H) 02/17/2019   NEUTROABS 8.0 (H) 02/17/2019    Imaging:  No results found.  Medications: I have reviewed the patient's current medications.  Assessment/Plan: 1. Metastatic adenocarcinoma of the appendix ? 10/02/2017 biopsy of an appendiceal orifice "polyp"revealed invasive adenocarcinoma ? CT abdomen/pelvis 09/26/2017 with evidence of carcinomatosis-omental caking and ascites ? Colonoscopy 10/02/2017 with multiple polyps-tubular adenomas ? Paracentesis 09/24/2017, 09/28/2017-no malignant cells identified ? Paracentesis 11/01/2017-malignant cells consistent with carcinoma ? Biopsy of left peritoneal mass 11/01/2017-poorly differentiated carcinoma ? CT chest 11/01/2017-? Small pleural nodules versus atelectasis, no other evidence of metastatic disease in the chest ? Cycle 1 FOLFOX 11/04/2017 ? Cycle 2 FOLFOX 11/18/2017 ? Cycle 3 FOLFOX 12/02/2017  ? Cycle 4 FOLFOX 12/17/2017 ? Cycle 5 FOLFOX 12/30/2017 ? Cycle 6 FOLFOX 01/14/2018 ? [facility] at [facility] 01/15/2018-improved abdominal ascites. Similar peritoneal metastatic burden with more discrete measurable peritoneal disease adjacent to the splenic flexure of the colon. Findings of diffuse bowel serosal metastatic involvement. ? 02/24/2018-laparotomy with right diaphragm peritonectomy, splenectomy, right colectomy, sigmoid colectomy, hyperthermic intraperitoneal chemotherapy mitomycin-C, stent placement;R1 resection(invasive adenocarcinoma, poorly differentiated, arising from the appendix; carcinoma invades to the wall of the appendix, into the surrounding adipose tissue and directly invades into the wall of the terminal ileum. Perineural invasion present. Total of 22 benign lymph nodes. Proximal distal margins free of tumor. Metastatic adenocarcinoma to the omentum. Benign spleen. Left gutter stripping metastatic adenocarcinoma; metastatic adenocarcinoma to the peritoneum;right diaphragm stripping metastatic adenocarcinoma; debrided tumor metastatic adenocarcinoma to the peritoneum;round ligament of liver metastatic adenocarcinoma). ? CTs 12/05/2018-new bilateral pleural effusions, moderate on the right and small on the left. Extensive nodularity along the pleural fissures on both sides and also along the medial pleura on the right. No recurrent omental disease. Progressive appearing ill defined infiltrating soft tissue mass in the pelvis along the rectosigmoid junction just below the anastomosis. ? Thoracentesis 12/17/2018-reactive mesothelial cells and acute inflammation ? 12/26/2018 flexible sigmoidoscopy-tortuous and anatomically abnormal left colon without any luminal masses, tumors. Sigmoid anastomosis noted about 20 cm from the anus associated with significant mucosal edema but not obvious tumors. ? Cycle 1 FOLFIRI 12/31/2018 ? 01/22/2019 guardant 360- no tumor related somatic  alterations were detected in patient's sample ? Cycle 1 Irinotecan 02/17/2019  2.Abdominal pain and bloating secondary to #1-improved  3.Port-A-Cath placement 11/01/2017  4.Nausea following cycle 1 FOLFOX-Emend and Decadron prophylaxis  added with cycle 2  5.  Hospitalization 01/02/2019 through 01/03/2019 presenting with chest pain, EKG with atrial fibrillation rate 164; Echo showed left ventricle with severely reduced systolic function with an ejection fraction of 20 to 25%.  Catheterization with moderate to severe left ventricular systolic dysfunction, low left ventricular end-diastolic pressure.  On Coreg.  Now followed by Dr. Stanford Breed.   Disposition: Derek Lloyd appears unchanged.  Plan to proceed with cycle 1 single agent Irinotecan today as scheduled.  We again reviewed potential toxicities.  He agrees with the above plan.  We reviewed the CBC from today.  Counts are adequate for treatment.  He will return for lab, follow-up, cycle 2 Irinotecan in 2 weeks.  He will contact the office in the interim with any problems.  We specifically discussed chest pain.  Patient seen with Dr. Benay Spice.    Ned Card ANP/GNP-BC   02/17/2019  9:37 AM This was a shared visit with Ned Card.  We discussed treatment plan with Derek Lloyd.  Molecular testing is not available.  Foundation 1 testing at [facility] did not yield significant DNA.  The plan is to proceed with irinotecan chemotherapy today. Julieanne Manson, MD

## 2019-02-17 NOTE — Progress Notes (Signed)
Add Pepcid 42m IV and infuse Irinotecan over 2 hours due to previous reaction.  BHardie Pulley PharmD, BCPS, BCOP

## 2019-02-17 NOTE — Patient Instructions (Signed)
[facility] Discharge Instructions for Patients Receiving Chemotherapy  Today you received the following chemotherapy agents:  Irinotecan  To help prevent nausea and vomiting after your treatment, we encourage you to take your nausea medication as prescribed.   If you develop nausea and vomiting that is not controlled by your nausea medication, call the clinic.   BELOW ARE SYMPTOMS THAT SHOULD BE REPORTED IMMEDIATELY:  *FEVER GREATER THAN 100.5 F  *CHILLS WITH OR WITHOUT FEVER  NAUSEA AND VOMITING THAT IS NOT CONTROLLED WITH YOUR NAUSEA MEDICATION  *UNUSUAL SHORTNESS OF BREATH  *UNUSUAL BRUISING OR BLEEDING  TENDERNESS IN MOUTH AND THROAT WITH OR WITHOUT PRESENCE OF ULCERS  *URINARY PROBLEMS  *BOWEL PROBLEMS  UNUSUAL RASH Items with * indicate a potential emergency and should be followed up as soon as possible.  Feel free to call the clinic should you have any questions or concerns. The clinic phone number is [phone].  Please show the [place] at check-in to the Emergency Department and triage nurse.

## 2019-02-19 ENCOUNTER — Telehealth: Payer: Self-pay | Admitting: Oncology

## 2019-02-19 NOTE — Telephone Encounter (Signed)
Called and spoke with patient. Confirmed appt  °

## 2019-02-23 ENCOUNTER — Telehealth: Payer: Self-pay | Admitting: *Deleted

## 2019-02-23 LAB — GUARDANT 360

## 2019-02-23 NOTE — Progress Notes (Deleted)
Cardiology Office Note   Date:  02/23/2019   ID:  Derek Lloyd, DOB 17-Jul-1970, MRN [id number]  PCP:  Patient, [name]  Cardiologist:  Dr.Crenshaw  No chief complaint on file.    History of Present Illness: Derek Lloyd is a 48 y.o. male who presents for ongoing assessment and management of A. fib, recent hospitalization on 01/02/2019 with A. fib versus SVT with diffuse ST elevation.  Catheterization revealed no coronary artery disease, echocardiogram revealed severely reduced EF of 25% to 30%.  LVEDP was 5.  Other history includes unstable angina, primary appendiceal adenocarcinoma, status post chemotherapy, and ascites.  He was last seen in the office by Coletta Memos, NP, on 01/20/2019.  At that time he was without any complaints of palpitation or chest discomfort and was feeling physically some better.  He had not been taking carvedilol due to hypotension.  He was restarted on carvedilol at lower dose at 3.125 mg twice daily, he was continued on aspirin 81 mg daily.   Past Medical History:  Diagnosis Date  . Abdominal pain 09/2017  . Anxiety attack   . apendix ca dx'd 09/2018   with intra abd mets  . Headache    "frequently" (08/14/2017)  . Heart murmur    "when I was a kid" (08/14/2017)  . Small bowel obstruction ([place]) 08/13/2017    Past Surgical History:  Procedure Laterality Date  . BIOPSY  10/02/2017   Procedure: BIOPSY;  Surgeon: [name], MD;  Location[facility] ENDOSCOPY;  Service: Gastroenterology;;  . COLONOSCOPY WITH PROPOFOL N/A 10/02/2017   Procedure: COLONOSCOPY WITH PROPOFOL;  Surgeon: [name], MD;  Location: [facility];  Service: Gastroenterology;  Laterality: N/A;  . FINGER SURGERY Left    "thumb"  . IR IMAGING GUIDED PORT INSERTION  11/01/2017  . IR PARACENTESIS  09/24/2017  . IR US GUIDE VASC ACCESS RIGHT  11/01/2017  . LEFT HEART CATH AND CORONARY ANGIOGRAPHY N/A 01/02/2019   Procedure: LEFT HEART CATH AND CORONARY ANGIOGRAPHY;  Surgeon: Wellington Hampshire,  MD;  Location: [facility] CV LAB;  Service: Cardiovascular;  Laterality: N/A;  . POLYPECTOMY N/A 10/02/2017   Procedure: POLYPECTOMY;  Surgeon: [name], MD;  Location: [facility];  Service: Gastroenterology;  Laterality: N/A;  . SUBMUCOSAL INJECTION  10/02/2017   Procedure: SUBMUCOSAL INJECTION;  Surgeon: [name], MD;  Location: [facility] ENDOSCOPY;  Service: Gastroenterology;;  . TYMPANOSTOMY TUBE PLACEMENT Bilateral      Current Outpatient Medications  Medication Sig Dispense Refill  . ALPRAZolam (XANAX) 0.5 MG tablet Take 1 tablet (0.5 mg total) by mouth 2 (two) times daily as needed for anxiety. 60 tablet 0  . aspirin EC 81 MG tablet Take 1 tablet (81 mg total) by mouth daily. 30 tablet 3  . carvedilol (COREG) 3.125 MG tablet Take 1 tablet (3.125 mg total) by mouth 2 (two) times daily with a meal. 60 tablet 6  . loperamide (IMODIUM) 2 MG capsule Take 2 capsules (4 mg total) by mouth as needed for diarrhea or loose stools. Maximum of 8 capsules/day 24 capsule 0  . Multiple Vitamins-Minerals (SPECTRAVITE) TABS Take by mouth.    Marland Kitchen omeprazole (PRILOSEC) 20 MG capsule Take 1 capsule (20 mg total) by mouth daily. 30 capsule 5  . ondansetron (ZOFRAN) 8 MG tablet Take 1 tablet (8 mg total) by mouth every 8 (eight) hours as needed for nausea or vomiting. (Patient not taking: Reported on 02/09/2019) 30 tablet 0  . prochlorperazine (COMPAZINE) 10 MG tablet Take 1 tablet (  10 mg total) by mouth every 6 (six) hours as needed for nausea. (Patient not taking: Reported on 02/09/2019) 30 tablet 0   No current facility-administered medications for this visit.     Allergies:   Piperacillin-tazobactam in dex    Social History:  The patient  reports that he has been smoking cigarettes. He has been smoking about 1.00 pack per day. He has never used smokeless tobacco. He reports current alcohol use. He reports current drug use. Drug: Marijuana.   Family History:  The patient's family history includes  Prostate cancer in his paternal grandfather.    ROS: All other systems are reviewed and negative. Unless otherwise mentioned in H&P    PHYSICAL EXAM: VS:  There were no vitals taken for this visit. , BMI There is no height or weight on file to calculate BMI. GEN: Well nourished, well developed, in no acute distress HEENT: normal Neck: no JVD, carotid bruits, or masses Cardiac: ***RRR; no murmurs, rubs, or gallops,no edema  Respiratory:  Clear to auscultation bilaterally, normal work of breathing GI: soft, nontender, nondistended, + BS MS: no deformity or atrophy Skin: warm and dry, no rash Neuro:  Strength and sensation are intact Psych: euthymic mood, full affect   EKG:  EKG {ACTION; IS/IS UTM:[id number]} ordered today. The ekg ordered today demonstrates ***   Recent Labs: 02/17/2019: ALT 29; BUN 8; Creatinine 0.74; Hemoglobin 14.1; Magnesium 1.7; Platelet Count 658; Potassium 4.2; Sodium 141    Lipid Panel    Component Value Date/Time   CHOL 178 01-30-19 1057   TRIG 83 2019-01-30 1057   HDL 53 Jan 30, 2019 1057   CHOLHDL 3.4 2019/01/30 1057   VLDL 17 01-30-2019 1057   LDLCALC 108 (H) 01-30-2019 1057      Wt Readings from Last 3 Encounters:  02/17/19 108 lb 14.4 oz (49.4 kg)  02/09/19 112 lb 1.6 oz (50.8 kg)  01/22/19 109 lb 8 oz (49.7 kg)      Other studies Reviewed: Echocardiogram 01/30/2019  IMPRESSIONS   1. The left ventricle has severely reduced systolic function, with an ejection fraction of 20-25%. The cavity size was normal. Left ventricular diastolic function could not be evaluated due to indeterminate diastolic function. Elevated left ventricular  end-diastolic pressure. 2. The right ventricle has moderately reduced systolic function. The cavity was normal. There is no increase in right ventricular wall thickness. Right ventricular systolic pressure could not be assessed. 3. The aortic valve is grossly normal. 4. The aorta is normal unless  otherwise noted. 5. Definity contrast agent was given IV to delineate the left ventricular endocardial borders. Possible early forming LV thrombus in the LV apex.  Cardiac catheterization 2019-01-30   There is moderate to severe left ventricular systolic dysfunction.  LV end diastolic pressure is normal.  1. Normal coronary arteries. 2. Moderately to severely reduced LV systolic function with an EF of 30 to 35%. 3. Low left ventricular end-diastolic pressure.  Recommendations: I suspect that the patient had a early repolarization in his EKG in the setting of significant tachycardia. He responded well to IV metoprolol and was in sinus rhythm during the procedure. However, he developed hypotension after metoprolol and sedation. He was given IV fluid boluses and was started on small dose norepinephrine drip. He was hemodynamically stable by the end of the case. I suspect that the patient is volume depleted but he also has underlying cardiomyopathy which is nonischemic. I ordered an echocardiogram to reevaluate his ejection fraction given that left-ventricular angiography was performed  when he was hypotensive.    ASSESSMENT AND PLAN:  1.  ***   Current medicines are reviewed at length with the patient today.    Labs/ tests ordered today include: *** Phill Myron. West Pugh, ANP, AACC   02/23/2019 8:33 AM    [facility] [place] [address] Office [phone] Fax [phone]

## 2019-02-23 NOTE — Telephone Encounter (Signed)
Reports he has not had a bowel movement since Thursday, 02/19/19. Reports his abdomen is distended and firm. Vomited X 1 over weekend. Did pass small hard balls yesterday. He adds that he does not have much money on hand to buy something. Suggested the try generic form of MOM and take dose asap and repeat in few hours. Call if no results. We are willing to see him if he wishes. He wants to try the MOM first and will call if he needs to come in.

## 2019-02-24 ENCOUNTER — Ambulatory Visit: Payer: Medicaid Other | Admitting: Adult Health

## 2019-02-27 ENCOUNTER — Telehealth: Payer: Self-pay | Admitting: *Deleted

## 2019-02-27 DIAGNOSIS — C181 Malignant neoplasm of appendix: Secondary | ICD-10-CM

## 2019-02-27 MED ORDER — ALPRAZOLAM 0.5 MG PO TABS: 0.5000 mg | ORAL_TABLET | Freq: Two times a day (BID) | ORAL | 0 refills | Status: DC | PRN

## 2019-02-27 NOTE — Telephone Encounter (Signed)
Requesting refill for his xanax (last filled 01/28/19). Informed him that is due this weekend, so script will be called in for him.

## 2019-03-01 ENCOUNTER — Other Ambulatory Visit: Payer: Self-pay | Admitting: Oncology

## 2019-03-03 ENCOUNTER — Telehealth: Payer: Self-pay | Admitting: Oncology

## 2019-03-03 ENCOUNTER — Other Ambulatory Visit: Payer: Medicaid Other

## 2019-03-03 ENCOUNTER — Inpatient Hospital Stay: Payer: Medicaid Other

## 2019-03-03 ENCOUNTER — Telehealth: Payer: Self-pay | Admitting: *Deleted

## 2019-03-03 ENCOUNTER — Inpatient Hospital Stay: Payer: Medicaid Other | Admitting: Oncology

## 2019-03-03 NOTE — Telephone Encounter (Signed)
Returned patient's phone call regarding rescheduling an appointment, left a voicemail. 

## 2019-03-03 NOTE — Telephone Encounter (Signed)
Left VM that he is stuck out of town and wants to cancel appointments today and reschedule for next week. Scheduling message sent.

## 2019-03-03 NOTE — Telephone Encounter (Signed)
R/s appt per 10/13 sch message - pt is aware of appt date and time

## 2019-03-08 ENCOUNTER — Other Ambulatory Visit: Payer: Self-pay | Admitting: Oncology

## 2019-03-09 ENCOUNTER — Inpatient Hospital Stay: Payer: Medicaid Other

## 2019-03-09 ENCOUNTER — Inpatient Hospital Stay (HOSPITAL_BASED_OUTPATIENT_CLINIC_OR_DEPARTMENT_OTHER): Payer: Medicaid Other | Admitting: Nurse Practitioner

## 2019-03-09 ENCOUNTER — Encounter: Payer: Self-pay | Admitting: Nurse Practitioner

## 2019-03-09 ENCOUNTER — Other Ambulatory Visit: Payer: Self-pay

## 2019-03-09 ENCOUNTER — Inpatient Hospital Stay: Payer: Medicaid Other | Attending: Oncology | Admitting: Nutrition

## 2019-03-09 VITALS — BP 90/61 | HR 99 | Temp 98.0°F | Resp 17 | Ht 70.0 in | Wt 108.5 lb

## 2019-03-09 DIAGNOSIS — I4891 Unspecified atrial fibrillation: Secondary | ICD-10-CM | POA: Insufficient documentation

## 2019-03-09 DIAGNOSIS — K219 Gastro-esophageal reflux disease without esophagitis: Secondary | ICD-10-CM | POA: Diagnosis not present

## 2019-03-09 DIAGNOSIS — Z9081 Acquired absence of spleen: Secondary | ICD-10-CM | POA: Insufficient documentation

## 2019-03-09 DIAGNOSIS — K5900 Constipation, unspecified: Secondary | ICD-10-CM | POA: Insufficient documentation

## 2019-03-09 DIAGNOSIS — C799 Secondary malignant neoplasm of unspecified site: Secondary | ICD-10-CM | POA: Diagnosis not present

## 2019-03-09 DIAGNOSIS — Z79899 Other long term (current) drug therapy: Secondary | ICD-10-CM | POA: Diagnosis not present

## 2019-03-09 DIAGNOSIS — C181 Malignant neoplasm of appendix: Secondary | ICD-10-CM

## 2019-03-09 DIAGNOSIS — C786 Secondary malignant neoplasm of retroperitoneum and peritoneum: Secondary | ICD-10-CM | POA: Insufficient documentation

## 2019-03-09 DIAGNOSIS — Z95828 Presence of other vascular implants and grafts: Secondary | ICD-10-CM

## 2019-03-09 DIAGNOSIS — R1900 Intra-abdominal and pelvic swelling, mass and lump, unspecified site: Secondary | ICD-10-CM | POA: Insufficient documentation

## 2019-03-09 DIAGNOSIS — R600 Localized edema: Secondary | ICD-10-CM | POA: Insufficient documentation

## 2019-03-09 DIAGNOSIS — R112 Nausea with vomiting, unspecified: Secondary | ICD-10-CM | POA: Insufficient documentation

## 2019-03-09 DIAGNOSIS — R109 Unspecified abdominal pain: Secondary | ICD-10-CM | POA: Insufficient documentation

## 2019-03-09 DIAGNOSIS — C787 Secondary malignant neoplasm of liver and intrahepatic bile duct: Secondary | ICD-10-CM | POA: Diagnosis not present

## 2019-03-09 DIAGNOSIS — R197 Diarrhea, unspecified: Secondary | ICD-10-CM | POA: Diagnosis not present

## 2019-03-09 DIAGNOSIS — Z5111 Encounter for antineoplastic chemotherapy: Secondary | ICD-10-CM | POA: Diagnosis present

## 2019-03-09 LAB — CMP (CANCER CENTER ONLY)
ALT: 16 U/L (ref 0–44)
AST: 16 U/L (ref 15–41)
Albumin: 2.9 g/dL — ABNORMAL LOW (ref 3.5–5.0)
Alkaline Phosphatase: 152 U/L — ABNORMAL HIGH (ref 38–126)
Anion gap: 12 (ref 5–15)
BUN: 8 mg/dL (ref 6–20)
CO2: 23 mmol/L (ref 22–32)
Calcium: 9.1 mg/dL (ref 8.9–10.3)
Chloride: 106 mmol/L (ref 98–111)
Creatinine: 0.76 mg/dL (ref 0.61–1.24)
GFR, Est AFR Am: >60 mL/min (ref >60)
GFR, Estimated: >60 mL/min (ref >60)
Glucose, Bld: 147 mg/dL — ABNORMAL HIGH (ref 70–99)
Potassium: 3.7 mmol/L (ref 3.5–5.1)
Sodium: 141 mmol/L (ref 135–145)
Total Bilirubin: 0.3 mg/dL (ref 0.3–1.2)
Total Protein: 6.8 g/dL (ref 6.5–8.1)

## 2019-03-09 LAB — CBC WITH DIFFERENTIAL (CANCER CENTER ONLY)
Abs Immature Granulocytes: 0.02 10*3/uL (ref 0.00–0.07)
Basophils Absolute: 0.1 10*3/uL (ref 0.0–0.1)
Basophils Relative: 1 %
Eosinophils Absolute: 0.2 10*3/uL (ref 0.0–0.5)
Eosinophils Relative: 2 %
HCT: 37.3 % — ABNORMAL LOW (ref 39.0–52.0)
Hemoglobin: 12.5 g/dL — ABNORMAL LOW (ref 13.0–17.0)
Immature Granulocytes: 0 %
Lymphocytes Relative: 19 %
Lymphs Abs: 1.9 10*3/uL (ref 0.7–4.0)
MCH: 30.7 pg (ref 26.0–34.0)
MCHC: 33.5 g/dL (ref 30.0–36.0)
MCV: 91.6 fL (ref 80.0–100.0)
Monocytes Absolute: 1.5 10*3/uL — ABNORMAL HIGH (ref 0.1–1.0)
Monocytes Relative: 15 %
Neutro Abs: 6.6 10*3/uL (ref 1.7–7.7)
Neutrophils Relative %: 63 %
Platelet Count: 743 10*3/uL — ABNORMAL HIGH (ref 150–400)
RBC: 4.07 MIL/uL — ABNORMAL LOW (ref 4.22–5.81)
RDW: 14.8 % (ref 11.5–15.5)
WBC Count: 10.3 10*3/uL (ref 4.0–10.5)
nRBC: 0 % (ref 0.0–0.2)

## 2019-03-09 LAB — MAGNESIUM: Magnesium: 1.6 mg/dL — ABNORMAL LOW (ref 1.7–2.4)

## 2019-03-09 MED ORDER — DEXAMETHASONE SODIUM PHOSPHATE 10 MG/ML IJ SOLN
INTRAMUSCULAR | Status: AC
  Filled 2019-03-09: qty 1

## 2019-03-09 MED ORDER — IRINOTECAN HCL CHEMO INJECTION 100 MG/5ML
180.0000 mg/m2 | Freq: Once | INTRAVENOUS | Status: AC
  Administered 2019-03-09: 280 mg via INTRAVENOUS
  Filled 2019-03-09: qty 14

## 2019-03-09 MED ORDER — FAMOTIDINE IN NACL 20-0.9 MG/50ML-% IV SOLN
20.0000 mg | Freq: Once | INTRAVENOUS | Status: AC
  Administered 2019-03-09: 12:00:00 20 mg via INTRAVENOUS

## 2019-03-09 MED ORDER — DIPHENHYDRAMINE HCL 50 MG/ML IJ SOLN
INTRAMUSCULAR | Status: AC
  Filled 2019-03-09: qty 1

## 2019-03-09 MED ORDER — ATROPINE SULFATE 1 MG/ML IJ SOLN
0.5000 mg | Freq: Once | INTRAMUSCULAR | Status: AC | PRN
  Administered 2019-03-09: 14:00:00 0.5 mg via INTRAVENOUS

## 2019-03-09 MED ORDER — DEXAMETHASONE SODIUM PHOSPHATE 10 MG/ML IJ SOLN
10.0000 mg | Freq: Once | INTRAMUSCULAR | Status: AC
  Administered 2019-03-09: 10 mg via INTRAVENOUS

## 2019-03-09 MED ORDER — HEPARIN SOD (PORK) LOCK FLUSH 100 UNIT/ML IV SOLN
500.0000 [IU] | Freq: Once | INTRAVENOUS | Status: AC | PRN
  Administered 2019-03-09: 15:00:00 500 [IU]
  Filled 2019-03-09: qty 5

## 2019-03-09 MED ORDER — SODIUM CHLORIDE 0.9% FLUSH
10.0000 mL | INTRAVENOUS | Status: DC | PRN
  Administered 2019-03-09: 10 mL
  Filled 2019-03-09: qty 10

## 2019-03-09 MED ORDER — SODIUM CHLORIDE 0.9 % IV SOLN
Freq: Once | INTRAVENOUS | Status: AC
  Administered 2019-03-09: 11:00:00 via INTRAVENOUS
  Filled 2019-03-09: qty 250

## 2019-03-09 MED ORDER — PALONOSETRON HCL INJECTION 0.25 MG/5ML
INTRAVENOUS | Status: AC
  Filled 2019-03-09: qty 5

## 2019-03-09 MED ORDER — PALONOSETRON HCL INJECTION 0.25 MG/5ML
0.2500 mg | Freq: Once | INTRAVENOUS | Status: AC
  Administered 2019-03-09: 11:00:00 0.25 mg via INTRAVENOUS

## 2019-03-09 MED ORDER — FAMOTIDINE IN NACL 20-0.9 MG/50ML-% IV SOLN
INTRAVENOUS | Status: AC
  Filled 2019-03-09: qty 50

## 2019-03-09 MED ORDER — ATROPINE SULFATE 1 MG/ML IJ SOLN
INTRAMUSCULAR | Status: AC
  Filled 2019-03-09: qty 1

## 2019-03-09 MED ORDER — SODIUM CHLORIDE 0.9% FLUSH
10.0000 mL | Freq: Once | INTRAVENOUS | Status: AC
  Administered 2019-03-09: 10 mL
  Filled 2019-03-09: qty 10

## 2019-03-09 NOTE — Patient Instructions (Signed)
[facility] Discharge Instructions for Patients Receiving Chemotherapy  Today you received the following chemotherapy agents:  Irinotecan  To help prevent nausea and vomiting after your treatment, we encourage you to take your nausea medication as prescribed.   If you develop nausea and vomiting that is not controlled by your nausea medication, call the clinic.   BELOW ARE SYMPTOMS THAT SHOULD BE REPORTED IMMEDIATELY:  *FEVER GREATER THAN 100.5 F  *CHILLS WITH OR WITHOUT FEVER  NAUSEA AND VOMITING THAT IS NOT CONTROLLED WITH YOUR NAUSEA MEDICATION  *UNUSUAL SHORTNESS OF BREATH  *UNUSUAL BRUISING OR BLEEDING  TENDERNESS IN MOUTH AND THROAT WITH OR WITHOUT PRESENCE OF ULCERS  *URINARY PROBLEMS  *BOWEL PROBLEMS  UNUSUAL RASH Items with * indicate a potential emergency and should be followed up as soon as possible.  Feel free to call the clinic should you have any questions or concerns. The clinic phone number is [phone].  Please show the [place] at check-in to the Emergency Department and triage nurse.

## 2019-03-09 NOTE — Progress Notes (Signed)
Atropine not given due to pt reports diarrhea very minimal day after treatment but then followed by several days of constipation.   About 1.5 hours in to Irinotecan infusion pt had episode of nausea and diarrhea.  Infusion paused and Atropine given.

## 2019-03-09 NOTE — Progress Notes (Signed)
Nutrition follow-up completed with patient during infusion for appendiceal cancer.  Patient is receiving irinotecan.  Weight was documented as 108.5 pounds decreased from 125.8 pounds in August 2019.  BMI indicates patient underweight at 15.57. Patient reports his appetite comes and goes. His bowels alternate between constipation and diarrhea. He will drink oral nutrition supplements but does not always have the finances to purchase. Nutrition focused physical exam was declined.  Nutrition diagnosis: Severe malnutrition likely continues.  Intervention: Educated patient to continue strategies for eating smaller amounts more often focusing on high-calorie, high-protein foods. Provided 1 complementary case of Ensure Enlive. Reviewed strategies for bowel regimen. Fact sheets were given to patient.  Questions were answered.  Contact information provided.  Monitoring, evaluation, goals: Patient will tolerate increased calories and protein to minimize weight loss throughout treatment.  Next visit: Tuesday, October 27 during infusion.  **Disclaimer: This note was dictated with voice recognition software. Similar sounding words can inadvertently be transcribed and this note may contain transcription errors which may not have been corrected upon publication of note.**

## 2019-03-09 NOTE — Progress Notes (Signed)
[facility] OFFICE PROGRESS NOTE   Diagnosis: Appendiceal cancer  INTERVAL HISTORY:   Mr. Tarnow returns as scheduled.  He completed a cycle of irinotecan 02/17/2019.  He developed mild diarrhea following chemotherapy.  This was followed by constipation.  He had an episode of nausea/vomiting while constipated.  No mouth sores.  He does note a "fever blister".  He has intermittent acid reflux.  No chest pain after chemotherapy.  He discontinued Coreg about 2 weeks ago and wonders if he needs to follow-up with cardiology.  Objective:  Vital signs in last 24 hours:  Blood pressure 90/61, pulse 99, temperature 98 F (36.7 C), temperature source Temporal, resp. rate 17, height 5' 10"  (1.778 m), weight 108 lb 8 oz (49.2 kg), SpO2 100 %.    HEENT: No thrush or ulcers.  Tiny blisterlike lesion left lower lip. GI: Abdomen soft, mildly distended.  No hepatomegaly. Vascular: No leg edema. Neuro: Alert and oriented. Skin: No rash. Port-A-Cath without erythema.   Lab Results:  Lab Results  Component Value Date   WBC 10.3 03/09/2019   HGB 12.5 (L) 03/09/2019   HCT 37.3 (L) 03/09/2019   MCV 91.6 03/09/2019   PLT 743 (H) 03/09/2019   NEUTROABS 6.6 03/09/2019    Imaging:  No results found.  Medications: I have reviewed the patient's current medications.  Assessment/Plan: 1. Metastatic adenocarcinoma of the appendix ? 10/02/2017 biopsy of an appendiceal orifice "polyp"revealed invasive adenocarcinoma ? CT abdomen/pelvis 09/26/2017 with evidence of carcinomatosis-omental caking and ascites ? Colonoscopy 10/02/2017 with multiple polyps-tubular adenomas ? Paracentesis 09/24/2017, 09/28/2017-no malignant cells identified ? Paracentesis 11/01/2017-malignant cells consistent with carcinoma ? Biopsy of left peritoneal mass 11/01/2017-poorly differentiated carcinoma ? CT chest 11/01/2017-? Small pleural nodules versus atelectasis, no other evidence of metastatic disease in the  chest ? Cycle 1 FOLFOX 11/04/2017 ? Cycle 2 FOLFOX 11/18/2017 ? Cycle 3 FOLFOX 12/02/2017 ? Cycle 4 FOLFOX 12/17/2017 ? Cycle 5 FOLFOX 12/30/2017 ? Cycle 6 FOLFOX 01/14/2018 ? [facility] at [facility] 01/15/2018-improved abdominal ascites. Similar peritoneal metastatic burden with more discrete measurable peritoneal disease adjacent to the splenic flexure of the colon. Findings of diffuse bowel serosal metastatic involvement. ? 02/24/2018-laparotomy with right diaphragm peritonectomy, splenectomy, right colectomy, sigmoid colectomy, hyperthermic intraperitoneal chemotherapy mitomycin-C, stent placement;R1 resection(invasive adenocarcinoma, poorly differentiated, arising from the appendix; carcinoma invades to the wall of the appendix, into the surrounding adipose tissue and directly invades into the wall of the terminal ileum. Perineural invasion present. Total of 22 benign lymph nodes. Proximal distal margins free of tumor. Metastatic adenocarcinoma to the omentum. Benign spleen. Left gutter stripping metastatic adenocarcinoma; metastatic adenocarcinoma to the peritoneum;right diaphragm stripping metastatic adenocarcinoma; debrided tumor metastatic adenocarcinoma to the peritoneum;round ligament of liver metastatic adenocarcinoma). ? CTs 12/05/2018-new bilateral pleural effusions, moderate on the right and small on the left. Extensive nodularity along the pleural fissures on both sides and also along the medial pleura on the right. No recurrent omental disease. Progressive appearing ill defined infiltrating soft tissue mass in the pelvis along the rectosigmoid junction just below the anastomosis. ? Thoracentesis 12/17/2018-reactive mesothelial cells and acute inflammation ? 12/26/2018 flexible sigmoidoscopy-tortuous and anatomically abnormal left colon without any luminal masses, tumors. Sigmoid anastomosis noted about 20 cm from the anus associated with significant mucosal edema but not obvious  tumors. ? Cycle 1 FOLFIRI 12/31/2018 ? 01/22/2019 guardant 360- no tumor related somatic alterations were detected in patient's sample ? Cycle 1 Irinotecan 02/17/2019 ? Cycle 2 Irinotecan 03/09/2019  2.Abdominal pain and bloating secondary  to #1-improved  3.Port-A-Cath placement 11/01/2017  4.Nausea following cycle 1 FOLFOX-Emend and Decadron prophylaxis added with cycle 2  5. Hospitalization 01/02/2019 through 01/03/2019 presenting with chest pain, EKG with atrial fibrillation rate 164; Echo showed left ventricle with severely reduced systolic function with an ejection fraction of 20 to 25%. Catheterization with moderate to severe left ventricular systolic dysfunction, low left ventricular end-diastolic pressure.On Coreg. Now followed by Dr. Stanford Breed.   Disposition: Mr. Sanderson appears unchanged.  He has completed 1 cycle of Irinotecan.  Overall he tolerated well.  Plan to proceed with cycle 2 today as scheduled.  We reviewed the CBC from today.  Counts adequate to proceed with treatment.  I encouraged him to resume Coreg and follow-up with Dr. Stanford Breed as scheduled.  He will return for lab, follow-up, cycle 3 irinotecan in 2 weeks.  He will contact the office in the interim with any problems.    Ned Card ANP/GNP-BC   03/09/2019  10:10 AM

## 2019-03-11 ENCOUNTER — Telehealth: Payer: Self-pay | Admitting: Oncology

## 2019-03-11 NOTE — Telephone Encounter (Signed)
Confirmed 11/2 visit with patient. Patient cannot come 11/3 due to he needs appointments to be after 10 am.

## 2019-03-17 ENCOUNTER — Inpatient Hospital Stay: Payer: Medicaid Other

## 2019-03-17 ENCOUNTER — Inpatient Hospital Stay: Payer: Medicaid Other | Admitting: Nurse Practitioner

## 2019-03-19 ENCOUNTER — Telehealth: Payer: Self-pay | Admitting: *Deleted

## 2019-03-19 NOTE — Telephone Encounter (Signed)
Left VM that he wants to speak with Dr. Benay Spice today about "serious issues". States he is sick a lot, losing weight. Not feeling well and wants to discuss w/MD and not the nurse. Called back and left VM that MD may not be able to call him until late this afternoon.

## 2019-03-23 ENCOUNTER — Other Ambulatory Visit: Payer: Self-pay

## 2019-03-23 ENCOUNTER — Inpatient Hospital Stay (HOSPITAL_BASED_OUTPATIENT_CLINIC_OR_DEPARTMENT_OTHER): Payer: Medicaid Other | Admitting: Oncology

## 2019-03-23 ENCOUNTER — Telehealth: Payer: Self-pay | Admitting: Oncology

## 2019-03-23 ENCOUNTER — Inpatient Hospital Stay: Payer: Medicaid Other | Attending: Oncology

## 2019-03-23 ENCOUNTER — Inpatient Hospital Stay: Payer: Medicaid Other

## 2019-03-23 ENCOUNTER — Inpatient Hospital Stay: Payer: Medicaid Other | Admitting: Nutrition

## 2019-03-23 VITALS — BP 93/60 | HR 87 | Temp 98.2°F | Resp 16 | Ht 70.0 in | Wt 110.2 lb

## 2019-03-23 DIAGNOSIS — C181 Malignant neoplasm of appendix: Secondary | ICD-10-CM | POA: Diagnosis not present

## 2019-03-23 DIAGNOSIS — Z95828 Presence of other vascular implants and grafts: Secondary | ICD-10-CM

## 2019-03-23 DIAGNOSIS — J90 Pleural effusion, not elsewhere classified: Secondary | ICD-10-CM | POA: Diagnosis not present

## 2019-03-23 DIAGNOSIS — I700 Atherosclerosis of aorta: Secondary | ICD-10-CM | POA: Insufficient documentation

## 2019-03-23 DIAGNOSIS — R197 Diarrhea, unspecified: Secondary | ICD-10-CM | POA: Insufficient documentation

## 2019-03-23 DIAGNOSIS — R112 Nausea with vomiting, unspecified: Secondary | ICD-10-CM | POA: Diagnosis not present

## 2019-03-23 DIAGNOSIS — Z9049 Acquired absence of other specified parts of digestive tract: Secondary | ICD-10-CM | POA: Diagnosis not present

## 2019-03-23 DIAGNOSIS — K5900 Constipation, unspecified: Secondary | ICD-10-CM | POA: Insufficient documentation

## 2019-03-23 DIAGNOSIS — C799 Secondary malignant neoplasm of unspecified site: Secondary | ICD-10-CM

## 2019-03-23 DIAGNOSIS — Z79899 Other long term (current) drug therapy: Secondary | ICD-10-CM | POA: Insufficient documentation

## 2019-03-23 DIAGNOSIS — I4891 Unspecified atrial fibrillation: Secondary | ICD-10-CM | POA: Diagnosis not present

## 2019-03-23 DIAGNOSIS — C787 Secondary malignant neoplasm of liver and intrahepatic bile duct: Secondary | ICD-10-CM | POA: Insufficient documentation

## 2019-03-23 DIAGNOSIS — C786 Secondary malignant neoplasm of retroperitoneum and peritoneum: Secondary | ICD-10-CM | POA: Insufficient documentation

## 2019-03-23 DIAGNOSIS — R1900 Intra-abdominal and pelvic swelling, mass and lump, unspecified site: Secondary | ICD-10-CM | POA: Insufficient documentation

## 2019-03-23 DIAGNOSIS — R600 Localized edema: Secondary | ICD-10-CM | POA: Insufficient documentation

## 2019-03-23 DIAGNOSIS — R1110 Vomiting, unspecified: Secondary | ICD-10-CM | POA: Diagnosis not present

## 2019-03-23 DIAGNOSIS — R109 Unspecified abdominal pain: Secondary | ICD-10-CM | POA: Insufficient documentation

## 2019-03-23 DIAGNOSIS — Z9081 Acquired absence of spleen: Secondary | ICD-10-CM | POA: Diagnosis not present

## 2019-03-23 LAB — CMP (CANCER CENTER ONLY)
ALT: 20 U/L (ref 0–44)
AST: 18 U/L (ref 15–41)
Albumin: 2.9 g/dL — ABNORMAL LOW (ref 3.5–5.0)
Alkaline Phosphatase: 153 U/L — ABNORMAL HIGH (ref 38–126)
Anion gap: 10 (ref 5–15)
BUN: 7 mg/dL (ref 6–20)
CO2: 25 mmol/L (ref 22–32)
Calcium: 8.9 mg/dL (ref 8.9–10.3)
Chloride: 105 mmol/L (ref 98–111)
Creatinine: 0.71 mg/dL (ref 0.61–1.24)
GFR, Est AFR Am: >60 mL/min (ref >60)
GFR, Estimated: >60 mL/min (ref >60)
Glucose, Bld: 90 mg/dL (ref 70–99)
Potassium: 4 mmol/L (ref 3.5–5.1)
Sodium: 140 mmol/L (ref 135–145)
Total Bilirubin: 0.3 mg/dL (ref 0.3–1.2)
Total Protein: 6.9 g/dL (ref 6.5–8.1)

## 2019-03-23 LAB — CBC WITH DIFFERENTIAL (CANCER CENTER ONLY)
Abs Immature Granulocytes: 0.01 10*3/uL (ref 0.00–0.07)
Basophils Absolute: 0.1 10*3/uL (ref 0.0–0.1)
Basophils Relative: 1 %
Eosinophils Absolute: 0.2 10*3/uL (ref 0.0–0.5)
Eosinophils Relative: 2 %
HCT: 35.3 % — ABNORMAL LOW (ref 39.0–52.0)
Hemoglobin: 11.7 g/dL — ABNORMAL LOW (ref 13.0–17.0)
Immature Granulocytes: 0 %
Lymphocytes Relative: 34 %
Lymphs Abs: 2.8 10*3/uL (ref 0.7–4.0)
MCH: 31 pg (ref 26.0–34.0)
MCHC: 33.1 g/dL (ref 30.0–36.0)
MCV: 93.4 fL (ref 80.0–100.0)
Monocytes Absolute: 1.1 10*3/uL — ABNORMAL HIGH (ref 0.1–1.0)
Monocytes Relative: 13 %
Neutro Abs: 4.2 10*3/uL (ref 1.7–7.7)
Neutrophils Relative %: 50 %
Platelet Count: 753 10*3/uL — ABNORMAL HIGH (ref 150–400)
RBC: 3.78 MIL/uL — ABNORMAL LOW (ref 4.22–5.81)
RDW: 14.3 % (ref 11.5–15.5)
WBC Count: 8.3 10*3/uL (ref 4.0–10.5)
nRBC: 0 % (ref 0.0–0.2)

## 2019-03-23 LAB — CEA (IN HOUSE-CHCC): CEA (CHCC-In House): 4.8 ng/mL (ref 0.00–5.00)

## 2019-03-23 LAB — MAGNESIUM: Magnesium: 1.6 mg/dL — ABNORMAL LOW (ref 1.7–2.4)

## 2019-03-23 MED ORDER — SODIUM CHLORIDE 0.9% FLUSH
10.0000 mL | INTRAVENOUS | Status: DC | PRN
  Administered 2019-03-23: 10 mL via INTRAVENOUS
  Filled 2019-03-23: qty 10

## 2019-03-23 MED ORDER — SODIUM CHLORIDE 0.9% FLUSH
10.0000 mL | Freq: Once | INTRAVENOUS | Status: AC
  Administered 2019-03-23: 10 mL
  Filled 2019-03-23: qty 10

## 2019-03-23 MED ORDER — HEPARIN SOD (PORK) LOCK FLUSH 100 UNIT/ML IV SOLN
500.0000 [IU] | Freq: Once | INTRAVENOUS | Status: AC
  Administered 2019-03-23: 500 [IU] via INTRAVENOUS
  Filled 2019-03-23: qty 5

## 2019-03-23 NOTE — Patient Instructions (Signed)

## 2019-03-23 NOTE — Telephone Encounter (Signed)
Scheduled per los. Called and spoke with patient. Confirmed appt 

## 2019-03-23 NOTE — Progress Notes (Signed)
[facility] OFFICE PROGRESS NOTE   Diagnosis: Appendiceal carcinoma  INTERVAL HISTORY:   [name] completed another treatment with irinotecan on 03/09/2019.  He reports the onset of diarrhea 2 to 3 days following chemotherapy.  He took Imodium and had constipation.  The diarrhea has now resolved.  He had intermittent episodes of vomiting over the past 2 weeks.  This has improved.  His appetite has improved over the past few days.  No dyspnea or chest pain.  He has "gas pain" in the subxiphoid area.  Objective:  Vital signs in last 24 hours:  Blood pressure 93/60, pulse 87, temperature 98.2 F (36.8 C), temperature source Temporal, resp. rate 16, height 5' 10"  (1.778 m), weight 110 lb 3.2 oz (50 kg), SpO2 100 %.    HEENT: No thrush or ulcers Resp: Inspiratory/expiratory rhonchi at the left anterior greater than posterior chest, no respiratory distress Cardio: Regular rate and rhythm GI: Mildly distended, no mass, tender in the right lower abdomen, no hepatomegaly Vascular: No leg edema    Portacath/PICC-without erythema  Lab Results:  Lab Results  Component Value Date   WBC 8.3 03/23/2019   HGB 11.7 (L) 03/23/2019   HCT 35.3 (L) 03/23/2019   MCV 93.4 03/23/2019   PLT 753 (H) 03/23/2019   NEUTROABS 4.2 03/23/2019    CMP  Lab Results  Component Value Date   NA 140 03/23/2019   K 4.0 03/23/2019   CL 105 03/23/2019   CO2 25 03/23/2019   GLUCOSE 90 03/23/2019   BUN 7 03/23/2019   CREATININE 0.71 03/23/2019   CALCIUM 8.9 03/23/2019   PROT 6.9 03/23/2019   ALBUMIN 2.9 (L) 03/23/2019   AST 18 03/23/2019   ALT 20 03/23/2019   ALKPHOS 153 (H) 03/23/2019   BILITOT 0.3 03/23/2019   GFRNONAA >60 03/23/2019   GFRAA >60 03/23/2019    Lab Results  Component Value Date   CEA1 3.70 02/17/2019     Medications: I have reviewed the patient's current medications.   Assessment/Plan: 1. Metastatic adenocarcinoma of the appendix ? 10/02/2017 biopsy of  an appendiceal orifice "polyp"revealed invasive adenocarcinoma ? CT abdomen/pelvis 09/26/2017 with evidence of carcinomatosis-omental caking and ascites ? Colonoscopy 10/02/2017 with multiple polyps-tubular adenomas ? Paracentesis 09/24/2017, 09/28/2017-no malignant cells identified ? Paracentesis 11/01/2017-malignant cells consistent with carcinoma ? Biopsy of left peritoneal mass 11/01/2017-poorly differentiated carcinoma ? CT chest 11/01/2017-? Small pleural nodules versus atelectasis, no other evidence of metastatic disease in the chest ? Cycle 1 FOLFOX 11/04/2017 ? Cycle 2 FOLFOX 11/18/2017 ? Cycle 3 FOLFOX 12/02/2017 ? Cycle 4 FOLFOX 12/17/2017 ? Cycle 5 FOLFOX 12/30/2017 ? Cycle 6 FOLFOX 01/14/2018 ? Restaging CTs at [facility] 01/15/2018-improved abdominal ascites. Similar peritoneal metastatic burden with more discrete measurable peritoneal disease adjacent to the splenic flexure of the colon. Findings of diffuse bowel serosal metastatic involvement. ? 02/24/2018-laparotomy with right diaphragm peritonectomy, splenectomy, right colectomy, sigmoid colectomy, hyperthermic intraperitoneal chemotherapy mitomycin-C, stent placement;R1 resection(invasive adenocarcinoma, poorly differentiated, arising from the appendix; carcinoma invades to the wall of the appendix, into the surrounding adipose tissue and directly invades into the wall of the terminal ileum. Perineural invasion present. Total of 22 benign lymph nodes. Proximal distal margins free of tumor. Metastatic adenocarcinoma to the omentum. Benign spleen. Left gutter stripping metastatic adenocarcinoma; metastatic adenocarcinoma to the peritoneum;right diaphragm stripping metastatic adenocarcinoma; debrided tumor metastatic adenocarcinoma to the peritoneum;round ligament of liver metastatic adenocarcinoma). ? CTs 12/05/2018-new bilateral pleural effusions, moderate on the right and small on the left. Extensive nodularity  along the pleural  fissures on both sides and also along the medial pleura on the right. No recurrent omental disease. Progressive appearing ill defined infiltrating soft tissue mass in the pelvis along the rectosigmoid junction just below the anastomosis. ? Thoracentesis 12/17/2018-reactive mesothelial cells and acute inflammation ? 12/26/2018 flexible sigmoidoscopy-tortuous and anatomically abnormal left colon without any luminal masses, tumors. Sigmoid anastomosis noted about 20 cm from the anus associated with significant mucosal edema but not obvious tumors. ? Cycle 1 FOLFIRI 12/31/2018 ? 01/22/2019 guardant 360- no tumor related somatic alterations were detected in patient's sample ? Cycle 1 Irinotecan 02/17/2019 ? Cycle 2 Irinotecan 03/09/2019  2.Abdominal pain and bloating secondary to #1-improved  3.Port-A-Cath placement 11/01/2017  4.Nausea following cycle 1 FOLFOX-Emend and Decadron prophylaxis added with cycle 2  5. Hospitalization 01/02/2019 through 01/03/2019 presenting with chest pain, EKG with atrial fibrillation rate 164; Echo showed left ventricle with severely reduced systolic function with an ejection fraction of 20 to 25%. Catheterization with moderate to severe left ventricular systolic dysfunction, low left ventricular end-diastolic pressure.On Coreg. Now followed by Dr. Stanford Lloyd.  Disposition: [name] has completed 2 cycles of single agent irinotecan.  The most recent cycle of chemotherapy was complicated by diarrhea and nausea/vomiting.  The symptoms are likely related to chemotherapy, but he may have tumor progression.  We decided to hold irinotecan today.  He will be scheduled for restaging CTs and return for an office visit in 1 week.  I encouraged him to follow-up with Dr. Stanford Lloyd for management of heart failure.  Derek Coder, MD  03/23/2019  11:18 AM

## 2019-03-24 ENCOUNTER — Other Ambulatory Visit: Payer: Medicaid Other

## 2019-03-24 ENCOUNTER — Ambulatory Visit: Payer: Medicaid Other

## 2019-03-24 ENCOUNTER — Ambulatory Visit: Payer: Medicaid Other | Admitting: Oncology

## 2019-03-26 ENCOUNTER — Telehealth: Payer: Self-pay | Admitting: *Deleted

## 2019-03-26 ENCOUNTER — Telehealth: Payer: Self-pay | Admitting: Oncology

## 2019-03-26 NOTE — Telephone Encounter (Signed)
Patient not able to make the CT scan on 11/6 at 0800 since he needs to start water based oral contrast at 0600. Rescheduled scan to 11/9 at 1 pm with arrival at 1045 to check in and start oral contrast. Instructed to be NPO after 0900 that day. He will then be seen at 2:15 pm by NP. Chemo will be rescheduled for 11/10. Scheduling message sent. Confirmed with transportation, speaking with [place] the time he needs to be picked up on Monday to be at radiology at 1045.  Derek Lloyd agrees to the changes to his schedule.

## 2019-03-26 NOTE — Telephone Encounter (Signed)
R/s appt per 11/5 sch message - pt is aware of new apts date and time

## 2019-03-27 ENCOUNTER — Ambulatory Visit (HOSPITAL_COMMUNITY): Payer: Medicaid Other

## 2019-03-29 ENCOUNTER — Other Ambulatory Visit: Payer: Self-pay | Admitting: Oncology

## 2019-03-30 ENCOUNTER — Encounter: Payer: Self-pay | Admitting: Nurse Practitioner

## 2019-03-30 ENCOUNTER — Inpatient Hospital Stay: Payer: Medicaid Other

## 2019-03-30 ENCOUNTER — Encounter: Payer: Medicaid Other | Admitting: Nutrition

## 2019-03-30 ENCOUNTER — Inpatient Hospital Stay: Payer: Medicaid Other | Admitting: Nutrition

## 2019-03-30 ENCOUNTER — Inpatient Hospital Stay: Payer: Medicaid Other | Admitting: Nurse Practitioner

## 2019-03-30 ENCOUNTER — Inpatient Hospital Stay (HOSPITAL_BASED_OUTPATIENT_CLINIC_OR_DEPARTMENT_OTHER): Payer: Medicaid Other | Admitting: Nurse Practitioner

## 2019-03-30 ENCOUNTER — Telehealth: Payer: Self-pay | Admitting: *Deleted

## 2019-03-30 ENCOUNTER — Ambulatory Visit (HOSPITAL_COMMUNITY)
Admission: RE | Admit: 2019-03-30 | Discharge: 2019-03-30 | Disposition: A | Discharge status: Home / Self Care | Payer: Medicaid Other | Source: Ambulatory Visit | Attending: Oncology | Admitting: Oncology

## 2019-03-30 ENCOUNTER — Other Ambulatory Visit: Payer: Self-pay

## 2019-03-30 VITALS — BP 114/52 | HR 97 | Temp 99.1°F | Resp 17 | Ht 70.0 in | Wt 106.2 lb

## 2019-03-30 DIAGNOSIS — C800 Disseminated malignant neoplasm, unspecified: Secondary | ICD-10-CM | POA: Diagnosis not present

## 2019-03-30 DIAGNOSIS — C181 Malignant neoplasm of appendix: Secondary | ICD-10-CM | POA: Insufficient documentation

## 2019-03-30 MED ORDER — IOHEXOL 300 MG/ML  SOLN
100.0000 mL | Freq: Once | INTRAMUSCULAR | Status: AC | PRN
  Administered 2019-03-30: 100 mL via INTRAVENOUS

## 2019-03-30 MED ORDER — IOHEXOL 300 MG/ML  SOLN
30.0000 mL | Freq: Once | INTRAMUSCULAR | Status: AC | PRN
  Administered 2019-03-30: 30 mL via ORAL

## 2019-03-30 MED ORDER — ALPRAZOLAM 0.5 MG PO TABS: 0.5000 mg | ORAL_TABLET | Freq: Two times a day (BID) | ORAL | 0 refills | Status: DC | PRN

## 2019-03-30 MED ORDER — SODIUM CHLORIDE (PF) 0.9 % IJ SOLN
INTRAMUSCULAR | Status: AC
  Filled 2019-03-30: qty 50

## 2019-03-30 MED ORDER — HYDROCODONE-ACETAMINOPHEN 5-325 MG PO TABS: 1.0000 | ORAL_TABLET | Freq: Four times a day (QID) | ORAL | 0 refills | Status: AC | PRN

## 2019-03-30 MED FILL — ALPRAZolam 0.5 MG TABS: 0.5 | 30 days supply | Qty: 60 | Fill #0

## 2019-03-30 MED FILL — HYDROCODON-APAP 5-325: 5-325 | 8 days supply | Qty: 60 | Fill #0

## 2019-03-30 NOTE — Progress Notes (Addendum)
[place] OFFICE PROGRESS NOTE   Diagnosis: Appendiceal carcinoma  INTERVAL HISTORY:   Derek Lloyd returns as scheduled.  He reports increased abdominal pain.  Bowels are moving.  He had a recent episode of nausea/vomiting/diarrhea.  No fever, cough, shortness of breath.  Objective:  Vital signs in last 24 hours:  Blood pressure (!) 114/52, pulse 97, temperature 99.1 F (37.3 C), temperature source Temporal, resp. rate 17, height 5' 10"  (1.778 m), weight 106 lb 3.2 oz (48.2 kg), SpO2 100 %.    HEENT: No thrush or ulcers. GI: Abdomen is distended, tender. Vascular: No leg edema. Neuro: Alert and oriented. Skin: Palms without erythema. Port-A-Cath without erythema.   Lab Results:  Lab Results  Component Value Date   WBC 8.3 03/23/2019   HGB 11.7 (L) 03/23/2019   HCT 35.3 (L) 03/23/2019   MCV 93.4 03/23/2019   PLT 753 (H) 03/23/2019   NEUTROABS 4.2 03/23/2019    Imaging:  Ct Chest W Contrast  Result Date: 03/30/2019 CLINICAL DATA:  Restaging carcinoma of the appendix. EXAM: CT CHEST, ABDOMEN, AND PELVIS WITH CONTRAST TECHNIQUE: Multidetector CT imaging of the chest, abdomen and pelvis was performed following the standard protocol during bolus administration of intravenous contrast. CONTRAST:  132m OMNIPAQUE IOHEXOL 300 MG/ML SOLN, 389mOMNIPAQUE IOHEXOL 300 MG/ML SOLN COMPARISON:  12/05/2018 FINDINGS: CT CHEST FINDINGS Cardiovascular: Normal heart size identified. No pericardial effusion. Mediastinum/Nodes: Normal appearance of the thyroid gland. The trachea appears patent and is midline. Normal appearance of the esophagus. No enlarged mediastinal or hilar lymph nodes. No axillary or supraclavicular adenopathy. Lungs/Pleura: Significant decrease in volume of right pleural effusion. Moderate left pleural effusion is noted, increased from previous exam. Again seen is bilateral perifissural nodularity and mild pleural nodularity concerning for pleural spread of  disease. With the exception of differing volumes of the pleural effusions the appearance of perifissural nodularity is unchanged. Musculoskeletal: No suspicious or acute osseous abnormality identified. CT ABDOMEN PELVIS FINDINGS Hepatobiliary: No suspicious liver parenchyma lesion identified. Gallbladder unremarkable. No biliary dilatation. Pancreas: Unremarkable. No pancreatic ductal dilatation or surrounding inflammatory changes. Spleen: Splenectomy. Adrenals/Urinary Tract: Normal appearance of the adrenal glands. There is bilateral pelvocaliectasis, increased from previous exam. No kidney mass. The urinary bladder is unremarkable. Stomach/Bowel: Stomach is unremarkable. Status post right hemicolectomy. Partial sigmoid colectomy also noted. The small bowel loops are increased in caliber measuring up to 3.6 cm, image 101/2. Previously 3.1 cm. The distal most small bowel loops just before the enterocolonic anastomosis are decreased in caliber. The scratch set there is increase caliber of the distal transverse colon, descending colon and proximal sigmoid colon. Significant decrease in luminal diameter at the level of the colorectal anastomosis is noted. Here, there is abnormal masslike mural thickening which measures 3.9 x 4.0 cm, image 70/4. Vascular/Lymphatic: Aortic atherosclerosis. No aneurysm. No retroperitoneal, pelvic or inguinal adenopathy. Reproductive: Prostate is unremarkable. Other: Interval development of large volume of loculated ascites within the abdomen. Musculoskeletal: No acute or significant osseous findings. IMPRESSION: 1. Interval development of large volume loculated fluid within the abdomen. Findings concerning for progression/recurrence of peritoneal carcinomatosis. 2. Persistent soft tissue mass involving the rectosigmoid anastomosis concerning for residual/recurrent tumor. Not significantly changed compared with previous exam. 3. Increase caliber of the small bowel loops with transition to  distal caliber small bowel proximal to the enterocolonic anastomosis. Findings concerning for partial small bowel obstruction. 4. Decrease volume of right pleural effusion with increased volume of left effusion. Persistent, similar perifissural nodularity within both lungs concerning  for pleural spread of disease. 5. Increase in bilateral pelvocaliectasis. Electronically Signed   By: Kerby Moors M.D.   On: 03/30/2019 14:10   Ct Abdomen Pelvis W Contrast  Result Date: 03/30/2019 CLINICAL DATA:  Restaging carcinoma of the appendix. EXAM: CT CHEST, ABDOMEN, AND PELVIS WITH CONTRAST TECHNIQUE: Multidetector CT imaging of the chest, abdomen and pelvis was performed following the standard protocol during bolus administration of intravenous contrast. CONTRAST:  118m OMNIPAQUE IOHEXOL 300 MG/ML SOLN, 334mOMNIPAQUE IOHEXOL 300 MG/ML SOLN COMPARISON:  12/05/2018 FINDINGS: CT CHEST FINDINGS Cardiovascular: Normal heart size identified. No pericardial effusion. Mediastinum/Nodes: Normal appearance of the thyroid gland. The trachea appears patent and is midline. Normal appearance of the esophagus. No enlarged mediastinal or hilar lymph nodes. No axillary or supraclavicular adenopathy. Lungs/Pleura: Significant decrease in volume of right pleural effusion. Moderate left pleural effusion is noted, increased from previous exam. Again seen is bilateral perifissural nodularity and mild pleural nodularity concerning for pleural spread of disease. With the exception of differing volumes of the pleural effusions the appearance of perifissural nodularity is unchanged. Musculoskeletal: No suspicious or acute osseous abnormality identified. CT ABDOMEN PELVIS FINDINGS Hepatobiliary: No suspicious liver parenchyma lesion identified. Gallbladder unremarkable. No biliary dilatation. Pancreas: Unremarkable. No pancreatic ductal dilatation or surrounding inflammatory changes. Spleen: Splenectomy. Adrenals/Urinary Tract: Normal  appearance of the adrenal glands. There is bilateral pelvocaliectasis, increased from previous exam. No kidney mass. The urinary bladder is unremarkable. Stomach/Bowel: Stomach is unremarkable. Status post right hemicolectomy. Partial sigmoid colectomy also noted. The small bowel loops are increased in caliber measuring up to 3.6 cm, image 101/2. Previously 3.1 cm. The distal most small bowel loops just before the enterocolonic anastomosis are decreased in caliber. The scratch set there is increase caliber of the distal transverse colon, descending colon and proximal sigmoid colon. Significant decrease in luminal diameter at the level of the colorectal anastomosis is noted. Here, there is abnormal masslike mural thickening which measures 3.9 x 4.0 cm, image 70/4. Vascular/Lymphatic: Aortic atherosclerosis. No aneurysm. No retroperitoneal, pelvic or inguinal adenopathy. Reproductive: Prostate is unremarkable. Other: Interval development of large volume of loculated ascites within the abdomen. Musculoskeletal: No acute or significant osseous findings. IMPRESSION: 1. Interval development of large volume loculated fluid within the abdomen. Findings concerning for progression/recurrence of peritoneal carcinomatosis. 2. Persistent soft tissue mass involving the rectosigmoid anastomosis concerning for residual/recurrent tumor. Not significantly changed compared with previous exam. 3. Increase caliber of the small bowel loops with transition to distal caliber small bowel proximal to the enterocolonic anastomosis. Findings concerning for partial small bowel obstruction. 4. Decrease volume of right pleural effusion with increased volume of left effusion. Persistent, similar perifissural nodularity within both lungs concerning for pleural spread of disease. 5. Increase in bilateral pelvocaliectasis. Electronically Signed   By: TaKerby Moors.D.   On: 03/30/2019 14:10    Medications: I have reviewed the patient's current  medications.  Assessment/Plan: 1. Metastatic adenocarcinoma of the appendix ? 10/02/2017 biopsy of an appendiceal orifice "polyp"revealed invasive adenocarcinoma ? CT abdomen/pelvis 09/26/2017 with evidence of carcinomatosis-omental caking and ascites ? Colonoscopy 10/02/2017 with multiple polyps-tubular adenomas ? Paracentesis 09/24/2017, 09/28/2017-no malignant cells identified ? Paracentesis 11/01/2017-malignant cells consistent with carcinoma ? Biopsy of left peritoneal mass 11/01/2017-poorly differentiated carcinoma ? CT chest 11/01/2017-? Small pleural nodules versus atelectasis, no other evidence of metastatic disease in the chest ? Cycle 1 FOLFOX 11/04/2017 ? Cycle 2 FOLFOX 11/18/2017 ? Cycle 3 FOLFOX 12/02/2017 ? Cycle 4 FOLFOX 12/17/2017 ? Cycle 5 FOLFOX 12/30/2017 ? Cycle  6 FOLFOX 01/14/2018 ? [facility]aging CT[facility] at [facility] 01/15/2018-improved abdominal ascites. Similar peritoneal metastatic burden with more discrete measurable peritoneal disease adjacent to the splenic flexure of the colon. Findings of diffuse bowel serosal metastatic involvement. ? 02/24/2018-laparotomy with right diaphragm peritonectomy, splenectomy, right colectomy, sigmoid colectomy, hyperthermic intraperitoneal chemotherapy mitomycin-C, stent placement;R1 resection(invasive adenocarcinoma, poorly differentiated, arising from the appendix; carcinoma invades to the wall of the appendix, into the surrounding adipose tissue and directly invades into the wall of the terminal ileum. Perineural invasion present. Total of 22 benign lymph nodes. Proximal distal margins free of tumor. Metastatic adenocarcinoma to the omentum. Benign spleen. Left gutter stripping metastatic adenocarcinoma; metastatic adenocarcinoma to the peritoneum;right diaphragm stripping metastatic adenocarcinoma; debrided tumor metastatic adenocarcinoma to the peritoneum;round ligament of liver metastatic adenocarcinoma). ? CTs 12/05/2018-new bilateral pleural  effusions, moderate on the right and small on the left. Extensive nodularity along the pleural fissures on both sides and also along the medial pleura on the right. No recurrent omental disease. Progressive appearing ill defined infiltrating soft tissue mass in the pelvis along the rectosigmoid junction just below the anastomosis. ? Thoracentesis 12/17/2018-reactive mesothelial cells and acute inflammation ? 12/26/2018 flexible sigmoidoscopy-tortuous and anatomically abnormal left colon without any luminal masses, tumors. Sigmoid anastomosis noted about 20 cm from the anus associated with significant mucosal edema but not obvious tumors. ? Cycle 1 FOLFIRI 12/31/2018 ? 01/22/2019 guardant 360-no tumor related somatic alterations were detected in patient's sample ? Cycle 1 Irinotecan9/29/2020 ? Cycle 2 Irinotecan 03/09/2019 ? CTs 03/30/2019-interval development of large volume loculated fluid within the abdomen.  Persistent soft tissue mass involving the rectosigmoid anastomosis concerning for residual/recurrent tumor.  Not significantly changed.  Increased caliber of small bowel loops with transition to distal caliber small bowel proximal to the enterocolonic anastomosis, concerning for partial small bowel obstruction.  Decreased volume of right pleural effusion with increased volume of left effusion.  Persistent similar perifissural nodularity within both lungs concerning for pleural spread of disease.  2.Abdominal pain and bloating secondary to #1-improved  3.Port-A-Cath placement 11/01/2017  4.Nausea following cycle 1 FOLFOX-Emend and Decadron prophylaxis added with cycle 2  5. Hospitalization 01/02/2019 through 01/03/2019 presenting with chest pain, EKG with atrial fibrillation rate 164; Echo showed left ventricle with severely reduced systolic function with an ejection fraction of 20 to 25%. Catheterization with moderate to severe left ventricular systolic dysfunction, low left  ventricular end-diastolic pressure.On Coreg. Now followed by Dr. Stanford Breed.   Disposition: Derek Lloyd appears unchanged.  He has completed 3 cycles of systemic therapy with no improvement in his overall condition.  He continues to have abdominal pain.  Restaging CTs from earlier today show an increased left pleural effusion, increased ascites and findings concerning for a partial small bowel obstruction.    Further chemotherapy is on hold.  We discussed a liquid diet.  We will contact Dr. Clovis Riley at [facility] regarding the above.  For pain he will try hydrocodone 1 to 2 tablets every 6 hours as needed.  He will return for a follow-up visit in approximately 1 week.  Patient seen with Dr. Benay Spice.    Ned Card ANP/GNP-BC   03/30/2019  2:27 PM  This was a shared visit with Ned Card.  Derek Lloyd was interviewed and examined.  We reviewed the CT images with him.  He appears to have progressive disease with an increased left pleural effusion, increased ascites, and evidence of a partial small bowel obstruction.  We decided to place irinotecan on hold.  He understands the bowel  obstruction is likely related to progressive carcinomatosis, though it is possible the obstruction secondary to adhesions.  He will begin a liquid diet.  I will contact Dr. Clovis Riley for his recommendations regarding management of the obstruction.  Systemic treatment options are limited.  We have been unable to obtain tissue for molecular testing.  We will contact [facility] to see if there is adequate tissue for molecular testing.   Julieanne Manson, MD

## 2019-03-30 NOTE — Telephone Encounter (Signed)
Patient called to report the transportation representative (male) he spoke with on Friday arranged him transportation to arrive at 12:45 prior to his 1 pm scan. He tried to explain that he needed to be there 2 hours early to drink the contrast, without success. He aslo requests refill on his xanax at [facility] since he will be here already. @ 0940 This RN called Di Kindle with transportation. She will reach out to patient now.

## 2019-03-31 ENCOUNTER — Other Ambulatory Visit: Payer: Medicaid Other

## 2019-03-31 ENCOUNTER — Ambulatory Visit: Payer: Medicaid Other

## 2019-04-01 ENCOUNTER — Telehealth: Payer: Self-pay | Admitting: Oncology

## 2019-04-01 ENCOUNTER — Telehealth: Payer: Self-pay | Admitting: *Deleted

## 2019-04-01 NOTE — Telephone Encounter (Signed)
Called and spoke with patient. Confirmed appt  °

## 2019-04-01 NOTE — Telephone Encounter (Addendum)
Patient left VM requesting return call. Called back and left VM that Dr. Clovis Riley is out all this week. Will try to get him an appointment to be seen next week. Will he be able to get transportation? Spoke with patient and he agrees to see Dr. Clovis Riley. Prefers to wait till 11/19, 11/20 or 11/23 due to needing to have time to find transportation. Will call tomorrow to attempt to schedule an appointment for him.

## 2019-04-03 ENCOUNTER — Telehealth: Payer: Self-pay | Admitting: *Deleted

## 2019-04-03 NOTE — Telephone Encounter (Signed)
Spoke with scheduling at [facility] for Dr. Clovis Riley [phone]) and 1st available to be seen is 11/20 at [place], otherwise 1st available is 05/06/19. Discussed with patient he declines the 11/20 due to transportation and it being too early in the day. Rescheduled for 05/06/19 at 10:15 am. Patient called back at 1045 and wants to delay visit to Dr. Clovis Riley to after January 1st. States all his appointments must be after 10-11 am to be able to get here. Will discuss further at next appointment and encourage patient to call and make his appointment with Dr. Clovis Riley.

## 2019-04-07 ENCOUNTER — Inpatient Hospital Stay (HOSPITAL_BASED_OUTPATIENT_CLINIC_OR_DEPARTMENT_OTHER): Payer: Medicaid Other | Admitting: Nurse Practitioner

## 2019-04-07 ENCOUNTER — Encounter: Payer: Self-pay | Admitting: Nurse Practitioner

## 2019-04-07 ENCOUNTER — Telehealth: Payer: Self-pay | Admitting: Nurse Practitioner

## 2019-04-07 ENCOUNTER — Other Ambulatory Visit: Payer: Self-pay

## 2019-04-07 VITALS — BP 102/66 | HR 98 | Temp 98.3°F | Resp 18 | Ht 70.0 in | Wt 108.2 lb

## 2019-04-07 DIAGNOSIS — C181 Malignant neoplasm of appendix: Secondary | ICD-10-CM

## 2019-04-07 DIAGNOSIS — C800 Disseminated malignant neoplasm, unspecified: Secondary | ICD-10-CM | POA: Diagnosis not present

## 2019-04-07 NOTE — Progress Notes (Signed)
[facility] OFFICE PROGRESS NOTE   Diagnosis: Appendiceal carcinoma  INTERVAL HISTORY:   Derek Lloyd returns as scheduled.  Bowels are moving.  He describes stool as "solid".  He is not taking a laxative at present.  He notes less pain with bowel movements.  A few days ago he had an episode of nausea/vomiting.  Diet is mainly liquids, soft solids.  Objective:  Vital signs in last 24 hours:  Blood pressure 102/66, pulse 98, temperature 98.3 F (36.8 C), resp. rate 18, height 5' 10"  (1.778 m), weight 108 lb 3.2 oz (49.1 kg), SpO2 100 %.    HEENT: No thrush or ulcers. GI: Abdomen is distended, tender. Vascular: No leg edema. Neuro: Alert and oriented. Port-A-Cath without erythema.  Lab Results:  Lab Results  Component Value Date   WBC 8.3 03/23/2019   HGB 11.7 (L) 03/23/2019   HCT 35.3 (L) 03/23/2019   MCV 93.4 03/23/2019   PLT 753 (H) 03/23/2019   NEUTROABS 4.2 03/23/2019    Imaging:  No results found.  Medications: I have reviewed the patient's current medications.  Assessment/Plan: 1. Metastatic adenocarcinoma of the appendix ? 10/02/2017 biopsy of an appendiceal orifice "polyp"revealed invasive adenocarcinoma ? CT abdomen/pelvis 09/26/2017 with evidence of carcinomatosis-omental caking and ascites ? Colonoscopy 10/02/2017 with multiple polyps-tubular adenomas ? Paracentesis 09/24/2017, 09/28/2017-no malignant cells identified ? Paracentesis 11/01/2017-malignant cells consistent with carcinoma ? Biopsy of left peritoneal mass 11/01/2017-poorly differentiated carcinoma ? CT chest 11/01/2017-? Small pleural nodules versus atelectasis, no other evidence of metastatic disease in the chest ? Cycle 1 FOLFOX 11/04/2017 ? Cycle 2 FOLFOX 11/18/2017 ? Cycle 3 FOLFOX 12/02/2017 ? Cycle 4 FOLFOX 12/17/2017 ? Cycle 5 FOLFOX 12/30/2017 ? Cycle 6 FOLFOX 01/14/2018 ? Restaging CTs at [facility] 01/15/2018-improved abdominal ascites. Similar peritoneal metastatic burden with  more discrete measurable peritoneal disease adjacent to the splenic flexure of the colon. Findings of diffuse bowel serosal metastatic involvement. ? 02/24/2018-laparotomy with right diaphragm peritonectomy, splenectomy, right colectomy, sigmoid colectomy, hyperthermic intraperitoneal chemotherapy mitomycin-C, stent placement;R1 resection(invasive adenocarcinoma, poorly differentiated, arising from the appendix; carcinoma invades to the wall of the appendix, into the surrounding adipose tissue and directly invades into the wall of the terminal ileum. Perineural invasion present. Total of 22 benign lymph nodes. Proximal distal margins free of tumor. Metastatic adenocarcinoma to the omentum. Benign spleen. Left gutter stripping metastatic adenocarcinoma; metastatic adenocarcinoma to the peritoneum;right diaphragm stripping metastatic adenocarcinoma; debrided tumor metastatic adenocarcinoma to the peritoneum;round ligament of liver metastatic adenocarcinoma). ? CTs 12/05/2018-new bilateral pleural effusions, moderate on the right and small on the left. Extensive nodularity along the pleural fissures on both sides and also along the medial pleura on the right. No recurrent omental disease. Progressive appearing ill defined infiltrating soft tissue mass in the pelvis along the rectosigmoid junction just below the anastomosis. ? Thoracentesis 12/17/2018-reactive mesothelial cells and acute inflammation ? 12/26/2018 flexible sigmoidoscopy-tortuous and anatomically abnormal left colon without any luminal masses, tumors. Sigmoid anastomosis noted about 20 cm from the anus associated with significant mucosal edema but not obvious tumors. ? Cycle 1 FOLFIRI 12/31/2018 ? 01/22/2019 guardant 360-no tumor related somatic alterations were detected in patient's sample ? Cycle 1 Irinotecan9/29/2020 ? Cycle 2 Irinotecan 03/09/2019 ? CTs 03/30/2019-interval development of large volume loculated fluid within the  abdomen.  Persistent soft tissue mass involving the rectosigmoid anastomosis concerning for residual/recurrent tumor.  Not significantly changed.  Increased caliber of small bowel loops with transition to distal caliber small bowel proximal to the enterocolonic anastomosis, concerning for  partial small bowel obstruction.  Decreased volume of right pleural effusion with increased volume of left effusion.  Persistent similar perifissural nodularity within both lungs concerning for pleural spread of disease.  2.Abdominal pain and bloating secondary to #1-improved  3.Port-A-Cath placement 11/01/2017  4.Nausea following cycle 1 FOLFOX-Emend and Decadron prophylaxis added with cycle 2  5. Hospitalization 01/02/2019 through 01/03/2019 presenting with chest pain, EKG with atrial fibrillation rate 164; Echo showed left ventricle with severely reduced systolic function with an ejection fraction of 20 to 25%. Catheterization with moderate to severe left ventricular systolic dysfunction, low left ventricular end-diastolic pressure.On Coreg. Now followed by Dr. Stanford Breed.   Disposition: Derek Lloyd appears stable.  Bowels have been moving regularly and he is having less pain.  No significant issues with nausea/vomiting.  He is tolerating a liquid/soft solid diet.  He will continue the same.  We discussed signs/symptoms of bowel obstruction.  He understands to contact the office should he develop these.  We will contact pathology at [facility] to request foundation 1 testing on the appendiceal mass.  Dr. Benay Spice recommends he resume irinotecan.  He would like to resume chemotherapy the week of 04/20/2019. We will see him in follow-up that day.  He will contact the office in the interim as outlined above or with any other problems.  Patient seen with Dr. Benay Spice.   Ned Card ANP/GNP-BC   04/07/2019  11:04 AM  This was a shared visit with Ned Card.  Derek Lloyd unable to attend an  appointment with Dr. Clovis Riley.  He does not wish to resume therapy until the week of 04/20/2019.  The obstructive symptoms have improved.  We will again attempt NGS testing on the resected appendiceal tumor.  Julieanne Manson, MD

## 2019-04-07 NOTE — Telephone Encounter (Signed)
Scheduled appt per 11/17 los.  Printed calendar and avs.

## 2019-04-08 ENCOUNTER — Telehealth: Payer: Self-pay | Admitting: *Deleted

## 2019-04-08 NOTE — Telephone Encounter (Signed)
Called to report he decided to keep the appointment with Dr. Clovis Riley for 05/06/19 at 10:15. Will work on transportation.

## 2019-04-19 ENCOUNTER — Other Ambulatory Visit: Payer: Self-pay | Admitting: Oncology

## 2019-04-22 ENCOUNTER — Telehealth: Payer: Self-pay | Admitting: *Deleted

## 2019-04-22 NOTE — Telephone Encounter (Signed)
Confirmed w/patient he has had an intermittent low grade fever up to 100.3 w/chills over the past 2 days. Occasional vomiting and loose stools w/abdominal cramping. Also has runny nose and sneezing. Thinks he has a bad cold along with the bowel issues he is going to see Dr. Clovis Riley about on 05/06/19. He has had no taste alterations, cough or change in shortness of breath. Confirms he has meds to use for symptom relief and knows to go to ER if his condition worsens. Informed due to precautions, he will need to have COVID test or be symptom free for 14 days before coming back in the office. He understands and agrees. He is requesting to wait and come after January 1st. Will f/u with him after his visit at [facility] on 05/06/19.

## 2019-04-23 ENCOUNTER — Inpatient Hospital Stay: Payer: Medicaid Other

## 2019-04-23 ENCOUNTER — Telehealth: Payer: Self-pay | Admitting: Oncology

## 2019-04-23 ENCOUNTER — Encounter: Payer: Medicaid Other | Admitting: Nutrition

## 2019-04-23 ENCOUNTER — Inpatient Hospital Stay: Payer: Medicaid Other | Admitting: Nurse Practitioner

## 2019-04-23 NOTE — Telephone Encounter (Signed)
Returned patient's phone call regarding rescheduling 12/03 appointments, per providers nurse approval appointment has moved to 01/04.

## 2019-04-27 ENCOUNTER — Other Ambulatory Visit: Payer: Self-pay | Admitting: *Deleted

## 2019-04-27 DIAGNOSIS — C181 Malignant neoplasm of appendix: Secondary | ICD-10-CM

## 2019-04-27 MED ORDER — ALPRAZOLAM 0.5 MG PO TABS: 0.5000 mg | ORAL_TABLET | Freq: Two times a day (BID) | ORAL | 0 refills | Status: DC | PRN

## 2019-05-05 ENCOUNTER — Telehealth: Payer: Self-pay | Admitting: *Deleted

## 2019-05-05 NOTE — Telephone Encounter (Signed)
Called reporting he had to move his visit with Dr. Clovis Riley from 12/16 due to concern over weather. Now has appointment on 06/03/2019. Says he is not doing well. Stools go from severe constipation and hard with significant lower abdominal pain to liquid so sudden he can't control it. Can't tolerate food--only water and warm beverages stay down. More nausea with vomiting. Too painful to get up much to do anything-spends most of time in bed. He reports being less than 100 lb now. Is afraid he will die before anything can be done for him surgically. Thinks having chemo will "kill me". He is requesting a telephone visit to talk with Dr. Benay Spice about everything.

## 2019-05-06 ENCOUNTER — Other Ambulatory Visit: Payer: Self-pay | Admitting: Oncology

## 2019-05-06 MED ORDER — OXYCODONE-ACETAMINOPHEN 5-325 MG PO TABS: 1.0000 | ORAL_TABLET | ORAL | 0 refills | Status: DC | PRN

## 2019-05-06 NOTE — Telephone Encounter (Signed)
Provided patient choice of telephone visit with Dr. Benay Spice and he has chosen 12/18 at 12:00. Appointment scheduled. Reports he still needs pain med refill and says the hydrocodone is really not effective any longer. Continues to express his feelings of helplessness and pain and inability to eat. Spends most of his time in bed. MD notified of need for refill and request for stronger pain medication.

## 2019-05-08 ENCOUNTER — Inpatient Hospital Stay: Payer: Medicaid Other | Attending: Oncology | Admitting: Oncology

## 2019-05-08 DIAGNOSIS — R188 Other ascites: Secondary | ICD-10-CM | POA: Insufficient documentation

## 2019-05-08 DIAGNOSIS — C787 Secondary malignant neoplasm of liver and intrahepatic bile duct: Secondary | ICD-10-CM | POA: Insufficient documentation

## 2019-05-08 DIAGNOSIS — Z9081 Acquired absence of spleen: Secondary | ICD-10-CM | POA: Insufficient documentation

## 2019-05-08 DIAGNOSIS — C786 Secondary malignant neoplasm of retroperitoneum and peritoneum: Secondary | ICD-10-CM | POA: Insufficient documentation

## 2019-05-08 DIAGNOSIS — Z79899 Other long term (current) drug therapy: Secondary | ICD-10-CM | POA: Insufficient documentation

## 2019-05-08 DIAGNOSIS — R109 Unspecified abdominal pain: Secondary | ICD-10-CM | POA: Insufficient documentation

## 2019-05-08 DIAGNOSIS — K649 Unspecified hemorrhoids: Secondary | ICD-10-CM | POA: Insufficient documentation

## 2019-05-08 DIAGNOSIS — C181 Malignant neoplasm of appendix: Secondary | ICD-10-CM | POA: Insufficient documentation

## 2019-05-08 DIAGNOSIS — J90 Pleural effusion, not elsewhere classified: Secondary | ICD-10-CM | POA: Insufficient documentation

## 2019-05-08 DIAGNOSIS — I4891 Unspecified atrial fibrillation: Secondary | ICD-10-CM | POA: Insufficient documentation

## 2019-05-08 DIAGNOSIS — R112 Nausea with vomiting, unspecified: Secondary | ICD-10-CM | POA: Insufficient documentation

## 2019-05-08 NOTE — Progress Notes (Signed)
Derek Lloyd OFFICE VISIT PROGRESS NOTE  I connected with Derek Lloyd on 05/08/19 at 1:00 PM EST by  and verified that I am speaking with the correct person using two identifiers.   I discussed the limitations, risks, security and privacy concerns of performing an evaluation and management service by telemedicine and the availability of in-person appointments. I also discussed with the patient that there may be a patient responsible charge related to this service. The patient expressed understanding and agreed to proceed.   Patient's location: Home Provider's location: Office   Diagnosis: Appendiceal cancer  INTERVAL HISTORY:   Derek Lloyd is seen today for a telehealth visit.  This is secondary to the Covid pandemic.  He was scheduled to see Derek Lloyd earlier this week, but the appointment was canceled secondary to the projected poor weather.  He reports improvement in his appetite beginning approximately 3 weeks ago, but he developed regurgitation and stopped eating.  He changed to a liquid diet and began to have bowel movements and had improvement in the nausea.  He has pain at the left side of his abdomen when he has bowel movements.  Oxycodone has helped his pain.  He notes difficulty breathing when he takes oxycodone.   Lab Results:  Lab Results  Component Value Date   WBC 8.3 03/23/2019   HGB 11.7 (L) 03/23/2019   HCT 35.3 (L) 03/23/2019   MCV 93.4 03/23/2019   PLT 753 (H) 03/23/2019   NEUTROABS 4.2 03/23/2019    Medications: I have reviewed the patient's current medications.  Assessment/Plan: 1. Metastatic adenocarcinoma of the appendix ? 10/02/2017 biopsy of an appendiceal orifice "polyp"revealed invasive adenocarcinoma ? CT abdomen/pelvis 09/26/2017 with evidence of carcinomatosis-omental caking and ascites ? Colonoscopy 10/02/2017 with multiple polyps-tubular adenomas ? Paracentesis 09/24/2017, 09/28/2017-no  malignant cells identified ? Paracentesis 11/01/2017-malignant cells consistent with carcinoma ? Biopsy of left peritoneal mass 11/01/2017-poorly differentiated carcinoma ? CT chest 11/01/2017-? Small pleural nodules versus atelectasis, no other evidence of metastatic disease in the chest ? Cycle 1 FOLFOX 11/04/2017 ? Cycle 2 FOLFOX 11/18/2017 ? Cycle 3 FOLFOX 12/02/2017 ? Cycle 4 FOLFOX 12/17/2017 ? Cycle 5 FOLFOX 12/30/2017 ? Cycle 6 FOLFOX 01/14/2018 ? [facility] 01/15/2018-improved abdominal ascites. Similar peritoneal metastatic burden with more discrete measurable peritoneal disease adjacent to the splenic flexure of the colon. Findings of diffuse bowel serosal metastatic involvement. ? 02/24/2018-laparotomy with right diaphragm peritonectomy, splenectomy, right colectomy, sigmoid colectomy, hyperthermic intraperitoneal chemotherapy mitomycin-C, stent placement;R1 resection(invasive adenocarcinoma, poorly differentiated, arising from the appendix; carcinoma invades to the wall of the appendix, into the surrounding adipose tissue and directly invades into the wall of the terminal ileum. Perineural invasion present. Total of 22 benign lymph nodes. Proximal distal margins free of tumor. Metastatic adenocarcinoma to the omentum. Benign spleen. Left gutter stripping metastatic adenocarcinoma; metastatic adenocarcinoma to the peritoneum;right diaphragm stripping metastatic adenocarcinoma; debrided tumor metastatic adenocarcinoma to the peritoneum;round ligament of liver metastatic adenocarcinoma). ? CTs 12/05/2018-new bilateral pleural effusions, moderate on the right and small on the left. Extensive nodularity along the pleural fissures on both sides and also along the medial pleura on the right. No recurrent omental disease. Progressive appearing ill defined infiltrating soft tissue mass in the pelvis along the rectosigmoid junction just below the anastomosis. ? Thoracentesis  12/17/2018-reactive mesothelial cells and acute inflammation ? 12/26/2018 flexible sigmoidoscopy-tortuous and anatomically abnormal left colon without any luminal masses, tumors. Sigmoid anastomosis noted about 20 cm from the anus associated with  significant mucosal edema but not obvious tumors. ? Cycle 1 FOLFIRI 12/31/2018 ? 01/22/2019 guardant 360-no tumor related somatic alterations were detected in patient's sample ? Cycle 1 Irinotecan9/29/2020 ? Cycle 2 Irinotecan 03/09/2019 ? CTs 03/30/2019-interval development of large volume loculated fluid within the abdomen. Persistent soft tissue mass involving the rectosigmoid anastomosis concerning for residual/recurrent tumor. Not significantly changed. Increased caliber of small bowel loops with transition to distal caliber small bowel proximal to the enterocolonic anastomosis, concerning for partial small bowel obstruction. Decreased volume of right pleural effusion with increased volume of left effusion. Persistent similar perifissural nodularity within both lungs concerning for pleural spread of disease.  2.Abdominal pain and bloating secondary to #1-improved  3.Port-A-Cath placement 11/01/2017  4.Nausea following cycle 1 FOLFOX-Emend and Decadron prophylaxis added with cycle 2  5. Hospitalization 01/02/2019 through 01/03/2019 presenting with chest pain, EKG with atrial fibrillation rate 164; Echo showed left ventricle with severely reduced systolic function with an ejection fraction of 20 to 25%. Catheterization with moderate to severe left ventricular systolic dysfunction, low left ventricular end-diastolic pressure.On Coreg. Now followed by Dr. Stanford Breed.    Disposition: Derek Lloyd has appendiceal carcinoma.  He has carcinomatosis that has experienced obstructive symptoms over the past month.  I recommended he continue a liquid/mechanical soft diet.  He will begin a bowel regimen with MiraLAX and Colace.  He was scheduled to  see Derek Lloyd earlier this week, but the appointment was canceled due to the weather.  He has rescheduled an appointment in approximately 3 weeks.  Derek Lloyd agrees to a follow-up here with a plain abdomen x-ray on 05/12/2019.  He requested a psychiatry evaluation.  We will asked the Cancer center social worker to meet with him on 05/12/2019.  He will continue oxycodone as needed for pain.  Systemic treatment options for the cancer are limited.  We have been unable to obtain molecular testing on the resected tissue.  Peripheral blood for guardant 360 testing did not reveal adequate DNA.   I discussed the assessment and treatment plan with the patient. The patient was provided an opportunity to ask questions and all were answered. The patient agreed with the plan and demonstrated an understanding of the instructions.   The patient was advised to call back or seek an in-person evaluation if the symptoms worsen or if the condition fails to improve as anticipated.  I provided 22 minutes of telephone, chart review, and documentation time during this encounter, and > 50% was spent counseling as documented under my assessment & plan.  Betsy Coder ANP/GNP-BC   05/08/2019 1:00 PM

## 2019-05-11 ENCOUNTER — Other Ambulatory Visit: Payer: Self-pay | Admitting: *Deleted

## 2019-05-11 ENCOUNTER — Telehealth: Payer: Self-pay | Admitting: *Deleted

## 2019-05-11 DIAGNOSIS — C799 Secondary malignant neoplasm of unspecified site: Secondary | ICD-10-CM

## 2019-05-11 NOTE — Progress Notes (Signed)
Referral placed for CSW to see and assess patient on 05/12/19 visit. Expressing depression and feelings of giving up.

## 2019-05-11 NOTE — Telephone Encounter (Signed)
Calling to report that he has not heard from scheduler regarding his appointment tomorrow or if transportation was arranged. Informed him the appointment is at 2:15 and this RN notified transportation service that he needed a ride and to arrive early to have xray prior to visit. He reports that he can't afford the MiraLax powder and Dulcolax made him vomit. Encouraged him to continue his soft, liquid diet for now. Write down all his questions for MD visit tomorrow. Will ask if dietician can provide him a case of Ensure (vanilla), as he likes this flavor. Also informed him we have requested the CSW to see him as well regarding his depression and anxiety.

## 2019-05-12 ENCOUNTER — Other Ambulatory Visit: Payer: Self-pay

## 2019-05-12 ENCOUNTER — Encounter: Payer: Self-pay | Admitting: *Deleted

## 2019-05-12 ENCOUNTER — Inpatient Hospital Stay (HOSPITAL_BASED_OUTPATIENT_CLINIC_OR_DEPARTMENT_OTHER): Payer: Medicaid Other | Admitting: Nurse Practitioner

## 2019-05-12 ENCOUNTER — Encounter: Payer: Self-pay | Admitting: Nurse Practitioner

## 2019-05-12 ENCOUNTER — Ambulatory Visit (HOSPITAL_COMMUNITY)
Admission: RE | Admit: 2019-05-12 | Discharge: 2019-05-12 | Disposition: A | Discharge status: Home / Self Care | Payer: Medicaid Other | Source: Ambulatory Visit | Attending: Oncology | Admitting: Oncology

## 2019-05-12 VITALS — BP 94/68 | HR 102 | Temp 98.9°F | Resp 19 | Ht 70.0 in | Wt 104.2 lb

## 2019-05-12 DIAGNOSIS — C181 Malignant neoplasm of appendix: Secondary | ICD-10-CM | POA: Diagnosis not present

## 2019-05-12 DIAGNOSIS — R109 Unspecified abdominal pain: Secondary | ICD-10-CM | POA: Diagnosis not present

## 2019-05-12 DIAGNOSIS — F32A Depression, unspecified: Secondary | ICD-10-CM

## 2019-05-12 DIAGNOSIS — I4891 Unspecified atrial fibrillation: Secondary | ICD-10-CM | POA: Diagnosis not present

## 2019-05-12 DIAGNOSIS — R112 Nausea with vomiting, unspecified: Secondary | ICD-10-CM | POA: Diagnosis not present

## 2019-05-12 DIAGNOSIS — C799 Secondary malignant neoplasm of unspecified site: Secondary | ICD-10-CM | POA: Diagnosis not present

## 2019-05-12 DIAGNOSIS — Z79899 Other long term (current) drug therapy: Secondary | ICD-10-CM | POA: Diagnosis not present

## 2019-05-12 DIAGNOSIS — J90 Pleural effusion, not elsewhere classified: Secondary | ICD-10-CM | POA: Diagnosis not present

## 2019-05-12 DIAGNOSIS — C787 Secondary malignant neoplasm of liver and intrahepatic bile duct: Secondary | ICD-10-CM | POA: Diagnosis not present

## 2019-05-12 DIAGNOSIS — C786 Secondary malignant neoplasm of retroperitoneum and peritoneum: Secondary | ICD-10-CM | POA: Diagnosis not present

## 2019-05-12 DIAGNOSIS — R188 Other ascites: Secondary | ICD-10-CM | POA: Diagnosis not present

## 2019-05-12 DIAGNOSIS — Z9081 Acquired absence of spleen: Secondary | ICD-10-CM | POA: Diagnosis not present

## 2019-05-12 DIAGNOSIS — K649 Unspecified hemorrhoids: Secondary | ICD-10-CM | POA: Diagnosis not present

## 2019-05-12 DIAGNOSIS — F329 Major depressive disorder, single episode, unspecified: Secondary | ICD-10-CM

## 2019-05-12 MED ORDER — HYDROCORTISONE (PERIANAL) 2.5 % EX CREA: 1.0000 "application " | TOPICAL_CREAM | Freq: Two times a day (BID) | CUTANEOUS | 0 refills | Status: DC

## 2019-05-12 NOTE — Progress Notes (Signed)
[place] Work  Clinical Social Work received referral from medical oncology and patient request for psych referral and emotional support.  CSW met with patient in the exam room to offer support and assess for needs.  Patient expressed the need for additional mental health support for depression associated with his diagnosis and treatment.  Patient stated he contacted [facility]- ACT Team, and is scheduled for an intake interview on 05/19/19 with Janine Ores.  Patient stated he may need additional information from [facility] to complete intake process.  CSW provided patient with contact information and fax number.  ACT Team staff can call or fax request for information.  CSW and patient discussed additional support in the community. Patient was agreeable to a referral to [facility] outpatient clinic for medication managment and therapy.  CSW shared request with RN who completed referral through epic.  CSW encouraged patient to call if he has any difficulties scheduling an appointment [facility] and/or with questions or concerns.  [name], MSW, LCSW, OSW-C Clinical Social Worker [facility] [phone]

## 2019-05-12 NOTE — Progress Notes (Addendum)
[place] OFFICE PROGRESS NOTE   Diagnosis: Appendiceal cancer  INTERVAL HISTORY:   Mr. Keirsey returns for follow-up.  He vomited after taking Dulcolax.  Bowels are moving.  He notes stools are "thin and different shapes".  He has increased pain at the left abdomen, especially after eating.  He has periodic nausea/vomiting.  He thinks he vomited "bowel" 3 times.  He feels hungry.  He is having discomfort at the rectum.  He notes an abnormality at the rectum.  Objective:  Vital signs in last 24 hours:  Blood pressure 94/68, pulse (!) 102, temperature 98.9 F (37.2 C), temperature source Temporal, resp. rate 19, height 5' 10"  (1.778 m), weight 104 lb 3.2 oz (47.3 kg), SpO2 98 %.   Cachectic appearance.  No acute distress. GI: External hemorrhoids.  None appear thrombosed.  No rectal mass. Vascular: No leg edema. Port-A-Cath without erythema.  Lab Results:  Lab Results  Component Value Date   WBC 8.3 03/23/2019   HGB 11.7 (L) 03/23/2019   HCT 35.3 (L) 03/23/2019   MCV 93.4 03/23/2019   PLT 753 (H) 03/23/2019   NEUTROABS 4.2 03/23/2019    Imaging:  No results found.  Medications: I have reviewed the patient's current medications.  Assessment/Plan: 1. Metastatic adenocarcinoma of the appendix ? 10/02/2017 biopsy of an appendiceal orifice "polyp"revealed invasive adenocarcinoma ? CT abdomen/pelvis 09/26/2017 with evidence of carcinomatosis-omental caking and ascites ? Colonoscopy 10/02/2017 with multiple polyps-tubular adenomas ? Paracentesis 09/24/2017, 09/28/2017-no malignant cells identified ? Paracentesis 11/01/2017-malignant cells consistent with carcinoma ? Biopsy of left peritoneal mass 11/01/2017-poorly differentiated carcinoma ? CT chest 11/01/2017-? Small pleural nodules versus atelectasis, no other evidence of metastatic disease in the chest ? Cycle 1 FOLFOX 11/04/2017 ? Cycle 2 FOLFOX 11/18/2017 ? Cycle 3 FOLFOX 12/02/2017 ? Cycle 4 FOLFOX  12/17/2017 ? Cycle 5 FOLFOX 12/30/2017 ? Cycle 6 FOLFOX 01/14/2018 ? [facility] CT[facility] at [facility] 01/15/2018-improved abdominal ascites. Similar peritoneal metastatic burden with more discrete measurable peritoneal disease adjacent to the splenic flexure of the colon. Findings of diffuse bowel serosal metastatic involvement. ? 02/24/2018-laparotomy with right diaphragm peritonectomy, splenectomy, right colectomy, sigmoid colectomy, hyperthermic intraperitoneal chemotherapy mitomycin-C, stent placement;R1 resection(invasive adenocarcinoma, poorly differentiated, arising from the appendix; carcinoma invades to the wall of the appendix, into the surrounding adipose tissue and directly invades into the wall of the terminal ileum. Perineural invasion present. Total of 22 benign lymph nodes. Proximal distal margins free of tumor. Metastatic adenocarcinoma to the omentum. Benign spleen. Left gutter stripping metastatic adenocarcinoma; metastatic adenocarcinoma to the peritoneum;right diaphragm stripping metastatic adenocarcinoma; debrided tumor metastatic adenocarcinoma to the peritoneum;round ligament of liver metastatic adenocarcinoma). ? CTs 12/05/2018-new bilateral pleural effusions, moderate on the right and small on the left. Extensive nodularity along the pleural fissures on both sides and also along the medial pleura on the right. No recurrent omental disease. Progressive appearing ill defined infiltrating soft tissue mass in the pelvis along the rectosigmoid junction just below the anastomosis. ? Thoracentesis 12/17/2018-reactive mesothelial cells and acute inflammation ? 12/26/2018 flexible sigmoidoscopy-tortuous and anatomically abnormal left colon without any luminal masses, tumors. Sigmoid anastomosis noted about 20 cm from the anus associated with significant mucosal edema but not obvious tumors. ? Cycle 1 FOLFIRI 12/31/2018 ? 01/22/2019 guardant 360-no tumor related somatic alterations were  detected in patient's sample ? Cycle 1 Irinotecan9/29/2020 ? Cycle 2 Irinotecan 03/09/2019 ? CTs 03/30/2019-interval development of large volume loculated fluid within the abdomen. Persistent soft tissue mass involving the rectosigmoid anastomosis concerning for residual/recurrent tumor. Not  significantly changed. Increased caliber of small bowel loops with transition to distal caliber small bowel proximal to the enterocolonic anastomosis, concerning for partial small bowel obstruction. Decreased volume of right pleural effusion with increased volume of left effusion. Persistent similar perifissural nodularity within both lungs concerning for pleural spread of disease.  2.Abdominal pain and bloating secondary to #1-improved  3.Port-A-Cath placement 11/01/2017  4.Nausea following cycle 1 FOLFOX-Emend and Decadron prophylaxis added with cycle 2  5. Hospitalization 01/02/2019 through 01/03/2019 presenting with chest pain, EKG with atrial fibrillation rate 164; Echo showed left ventricle with severely reduced systolic function with an ejection fraction of 20 to 25%. Catheterization with moderate to severe left ventricular systolic dysfunction, low left ventricular end-diastolic pressure.On Coreg. Now followed by Dr. Stanford Breed.   Disposition: Mr. Hochstetler appears unchanged.  He continues to have intermittent obstructive symptoms.  He will continue a liquid/low residue diet.  We provided him with additional information on this diet today.  He will begin MiraLAX daily and Colace twice daily.  For the painful hemorrhoids he will try Anusol HC twice daily.  He understands he is not a candidate for chemotherapy in his current condition.  He has an appointment with Dr. Clovis Riley 06/02/2018.  We will see him in follow-up here as scheduled on 05/24/2018.  He will contact the office in the interim with any problems.  Patient seen with Dr. Benay Spice.  25 minutes were spent face-to-face at today's  visit with the majority of that time involved in counseling/coordination of care.    Ned Card ANP/GNP-BC   05/12/2019  3:04 PM  This was a shared visit with Ned Card.  Mr. Chiarelli was interviewed and examined.  I reviewed the abdominal x-ray images.  He continues to have obstructive symptoms, likely from tumor involving the left colon.  We encouraged him to follow a low residual diet and continue a stool softener/laxative regimen.  He will see Dr. Clovis Riley in approximately 3 weeks.  Julieanne Manson, MD

## 2019-05-12 NOTE — Patient Instructions (Signed)
Low-Fiber Eating Plan Fiber is found in fruits, vegetables, whole grains, and beans. Eating a diet low in fiber helps to reduce how often you have bowel movements and how much you produce during a bowel movement. A low-fiber eating plan may help your digestive system heal if:  You have certain conditions, such as Crohn's disease or diverticulitis.  You recently had radiation therapy on your pelvis or bowel.  You recently had intestinal surgery.  You have a new surgical opening in your abdomen (colostomy or ileostomy).  Your intestine is narrowed (stricture). Your health care provider will determine how long you need to stay on this diet. Your health care provider may recommend that you work with a diet and nutrition specialist (dietitian). What are tips for following this plan? General guidelines  Follow recommendations from your dietitian about how much fiber you should have each day.  Most people on this eating plan should try to eat less than 10 grams (g) of fiber each day. Your daily fiber goal is _________________ g.  Take vitamin and mineral supplements as told by your health care provider or dietitian. Chewable or liquid forms are best when on this eating plan. Reading food labels  Check food labels for the amount of dietary fiber.  Choose foods that have less than 2 grams of fiber in one serving. Cooking  Use white flour and other allowed grains for baking and cooking.  Cook meat using methods that keep it tender, such as braising or poaching.  Cook eggs until the yolk is completely solid.  Cook with healthy oils, such as olive oil or canola oil. Meal planning   Eat 5-6 small meals throughout the day instead of 3 large meals.  If you are lactose intolerant: ? Choose low-lactose dairy foods. ? Do not eat dairy foods, if told by your dietitian.  Limit fat and oils to less than 8 teaspoons a day.  Eat small portions of desserts. What foods are allowed? The items  listed below may not be a complete list. Talk with your dietitian about what dietary choices are best for you. Grains All bread and crackers made with white flour. Waffles, pancakes, and [place] toast. Bagels. Pretzels. Melba toast, zwieback, and matzoh. Cooked and dried cereals that do not contain whole grains, added fiber, seeds, or dried fruit. CornmealDomenick Gong. Hot and cold cereals made with refined corn, wheat, rice, or oats. Plain pasta and noodles. White rice. Vegetables Well-cooked or canned vegetables without skin, seeds, or stems. Cooked potatoes without skins. Vegetable juice. Fruits Soft-cooked or canned fruits without skin and seeds. Peeled ripe banana. Applesauce. Fruit juice without pulp. Meats and other protein foods Ground meat. Tender cuts of meat or poultry. Eggs. Fish, seafood, and shellfish. Smooth nut butters. Tofu. Dairy All milk products and drinks. Lactose-free milks, including rice, soy, and almond milks. Yogurt without fruit, nuts, chocolate, or granola mix-ins. Sour cream. Cottage cheese. Cheese. Beverages Decaf coffee. Fruit and vegetable juices or smoothies (in small amounts, with no pulp or skins, and with fruits from allowed list). Sports drinks. Herbal tea. Fats and oils Olive oil, canola oil, sunflower oil, flaxseed oil, and grapeseed oil. Mayonnaise. Cream cheese. Margarine. Butter. Sweets and desserts Plain cakes and cookies. Cream pies and pies made with allowed fruits. Pudding. Custard. Fruit gelatin. Sherbet. Popsicles. Ice cream without nuts. Plain hard candy. Honey. Jelly. Molasses. Syrups, including chocolate syrup. Chocolate. Marshmallows. Gumdrops. Seasoning and other foods Bouillon. Broth. Cream soups made from allowed foods. Strained soup. Casseroles made  with allowed foods. Ketchup. Mild mustard. Mild salad dressings. Plain gravies. Vinegar. Spices in moderation. Salt. Sugar. What foods are not allowed? The items listed below may not be a complete  list. Talk with your dietitian about what dietary choices are best for you. Grains Whole wheat and whole grain breads and crackers. Multigrain breads and crackers. Rye bread. Whole grain or multigrain cereals. Cereals with nuts, raisins, or coconut. Bran. Coarse wheat cereals. Granola. High-fiber cereals. Cornmeal or corn bread. Whole grain pasta. Wild or brown rice. Quinoa. Popcorn. Buckwheat. Wheat germ. Vegetables Potato skins. Raw or undercooked vegetables. All beans and bean sprouts. Cooked greens. Corn. Peas. Cabbage. Beets. Broccoli. Brussels sprouts. Cauliflower. Mushrooms. Onions. Peppers. Parsnips. Okra. Sauerkraut. Fruit Raw or dried fruit. Berries. Fruit juice with pulp. Prune juice. Meats and other protein foods Tough, fibrous meats with gristle. Fatty meat. Poultry with skin. Fried meat, Sales executive, or fish. Deli or lunch meats. Sausage, bacon, and hot dogs. Nuts and chunky nut butter. Dried peas, beans, and lentils. Dairy Yogurt with fruit, nuts, chocolate, or granola mix-ins. Beverages Caffeinated coffee and teas. Fats and oils Avocado. Coconut. Sweets and desserts Desserts, cookies, or candies that contain nuts or coconut. Dried fruit. Jams and preserves with seeds. Marmalade. Any dessert made with fruits or grains that are not allowed. Seasoning and other foods Corn tortilla chips. Soups made with vegetables or grains that are not allowed. Relish. Horseradish. Derek Lloyd. Olives. Summary  Most people on a low-fiber eating plan should eat less than 10 grams of fiber a day. Follow recommendations from your dietitian about how much fiber you should have each day.  Always check food labels to see the dietary fiber content of packaged foods. In general, a low-fiber food will have fewer than 2 grams of fiber per serving.  In general, try to avoid whole grains, raw fruits and vegetables, dried fruit, tough cuts of meat, nuts, and seeds.  Take a vitamin and mineral supplement as told  by your health care provider or dietitian. This information is not intended to replace advice given to you by your health care provider. Make sure you discuss any questions you have with your health care provider. Document Released: 10/27/2001 Document Revised: 08/29/2018 Document Reviewed: 07/10/2016 Elsevier Patient Education  2020 [employer].

## 2019-05-13 ENCOUNTER — Encounter: Payer: Self-pay | Admitting: Nutrition

## 2019-05-13 NOTE — Progress Notes (Signed)
Provided 1 complementary case of Ensure Enlive.

## 2019-05-25 ENCOUNTER — Other Ambulatory Visit: Payer: Medicaid Other

## 2019-05-25 ENCOUNTER — Encounter: Payer: Self-pay | Admitting: Nurse Practitioner

## 2019-05-25 ENCOUNTER — Inpatient Hospital Stay: Payer: Medicaid Other | Attending: Oncology

## 2019-05-25 ENCOUNTER — Ambulatory Visit: Payer: Medicaid Other

## 2019-05-25 ENCOUNTER — Inpatient Hospital Stay (HOSPITAL_BASED_OUTPATIENT_CLINIC_OR_DEPARTMENT_OTHER): Payer: Medicaid Other | Admitting: Nurse Practitioner

## 2019-05-25 ENCOUNTER — Other Ambulatory Visit: Payer: Self-pay

## 2019-05-25 VITALS — BP 106/69 | HR 95 | Temp 99.2°F | Resp 20 | Wt 105.4 lb

## 2019-05-25 DIAGNOSIS — R600 Localized edema: Secondary | ICD-10-CM | POA: Diagnosis not present

## 2019-05-25 DIAGNOSIS — C786 Secondary malignant neoplasm of retroperitoneum and peritoneum: Secondary | ICD-10-CM | POA: Insufficient documentation

## 2019-05-25 DIAGNOSIS — Z9081 Acquired absence of spleen: Secondary | ICD-10-CM | POA: Diagnosis not present

## 2019-05-25 DIAGNOSIS — R109 Unspecified abdominal pain: Secondary | ICD-10-CM | POA: Insufficient documentation

## 2019-05-25 DIAGNOSIS — C787 Secondary malignant neoplasm of liver and intrahepatic bile duct: Secondary | ICD-10-CM | POA: Diagnosis not present

## 2019-05-25 DIAGNOSIS — C799 Secondary malignant neoplasm of unspecified site: Secondary | ICD-10-CM

## 2019-05-25 DIAGNOSIS — R1900 Intra-abdominal and pelvic swelling, mass and lump, unspecified site: Secondary | ICD-10-CM | POA: Diagnosis not present

## 2019-05-25 DIAGNOSIS — C181 Malignant neoplasm of appendix: Secondary | ICD-10-CM | POA: Insufficient documentation

## 2019-05-25 DIAGNOSIS — Z79899 Other long term (current) drug therapy: Secondary | ICD-10-CM | POA: Diagnosis not present

## 2019-05-25 DIAGNOSIS — I4891 Unspecified atrial fibrillation: Secondary | ICD-10-CM | POA: Diagnosis not present

## 2019-05-25 DIAGNOSIS — R140 Abdominal distension (gaseous): Secondary | ICD-10-CM | POA: Diagnosis not present

## 2019-05-25 DIAGNOSIS — K649 Unspecified hemorrhoids: Secondary | ICD-10-CM | POA: Diagnosis not present

## 2019-05-25 DIAGNOSIS — R112 Nausea with vomiting, unspecified: Secondary | ICD-10-CM | POA: Insufficient documentation

## 2019-05-25 DIAGNOSIS — Z95828 Presence of other vascular implants and grafts: Secondary | ICD-10-CM

## 2019-05-25 MED ORDER — SODIUM CHLORIDE 0.9% FLUSH
10.0000 mL | Freq: Once | INTRAVENOUS | Status: AC
  Administered 2019-05-25: 12:00:00 10 mL
  Filled 2019-05-25: qty 10

## 2019-05-25 MED ORDER — HEPARIN SOD (PORK) LOCK FLUSH 100 UNIT/ML IV SOLN
500.0000 [IU] | Freq: Once | INTRAVENOUS | Status: AC
  Administered 2019-05-25: 500 [IU]
  Filled 2019-05-25: qty 5

## 2019-05-25 MED ORDER — ALPRAZOLAM 0.5 MG PO TABS: 0.5000 mg | ORAL_TABLET | Freq: Two times a day (BID) | ORAL | 0 refills | Status: DC | PRN

## 2019-05-25 MED ORDER — OXYCODONE-ACETAMINOPHEN 5-325 MG PO TABS: 1.0000 | ORAL_TABLET | ORAL | 0 refills | Status: DC | PRN
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
## 2019-05-25 NOTE — Patient Instructions (Signed)

## 2019-05-27 ENCOUNTER — Telehealth: Payer: Self-pay | Admitting: Oncology

## 2019-05-27 NOTE — Telephone Encounter (Signed)
Scheduled per los. Called and spoke with patient. Confirmed appt 

## 2019-05-29 ENCOUNTER — Telehealth: Payer: Self-pay | Admitting: *Deleted
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
## 2019-06-09 ENCOUNTER — Telehealth: Payer: Self-pay | Admitting: *Deleted

## 2019-06-09 NOTE — Telephone Encounter (Signed)
At visit with Dr. Clovis Riley, he wanted CT scan in 3 weeks at [facility]. Patient requesting scan to be done locally due to no transportation to [facility]. Does he need to wait for 3 weeks or could it be done on 06/11/2019 when he is here? Either way, he wants everything done on same day in [place]. Will f/u with [facility] to see if there is something specific regarding timing of scan.

## 2019-06-10 ENCOUNTER — Telehealth: Payer: Self-pay | Admitting: *Deleted

## 2019-06-10 DIAGNOSIS — C181 Malignant neoplasm of appendix: Secondary | ICD-10-CM

## 2019-06-10 NOTE — Telephone Encounter (Addendum)
Left VM for Dr. Georgina Snell office triage nurse to get OK to do scan in [place] and then push it over to them to view. Per Dr. Benay Spice: OK for CT scan here instead of [facility] and can push images over to Dr. Clovis Riley to view. Will cancel his visit tomorrow--patient wants scan and OV on same day for transportation reasons. Message to managed care to work on [place]. Left VM for patient that we will arrange for scan to be done at [facility]--awaiting PA to schedule. Will cancel his OV for 06/11/19 and alerted transportation as well. Requested he return call to let nurse know if he wants to use his port for the scan for IV contrast? Will need labs that day as well. Patient called back and would like his port used for CT scan and agrees to cancel appointment for 1/21.

## 2019-06-11 ENCOUNTER — Inpatient Hospital Stay: Payer: Medicaid Other | Admitting: Oncology

## 2019-06-12 ENCOUNTER — Telehealth: Payer: Self-pay | Admitting: *Deleted

## 2019-06-12 NOTE — Telephone Encounter (Signed)
Called patient to inform him of his appointment times for 06/18/19--lab/flush at 10:30--will access port for his IV contrast. Will go to radiology at 11:00 to drink his two bottles of the water based contrast and come to [facility] after scan at 1pm. After this was presented he became loud and proceeded to yell at RN for how his life is messed up and no one cares about him and because he is a poor white man, he can't get any help. Requested he refrain from yelling at RN, which helped.  Patient called back and apologized for yelling. Provided him with information that his transportation will pick him up at [place] on 06/18/19. He understands and agrees.

## 2019-06-15 ENCOUNTER — Telehealth: Payer: Self-pay | Admitting: *Deleted

## 2019-06-15 DIAGNOSIS — C181 Malignant neoplasm of appendix: Secondary | ICD-10-CM

## 2019-06-15 DIAGNOSIS — C799 Secondary malignant neoplasm of unspecified site: Secondary | ICD-10-CM

## 2019-06-15 MED ORDER — POLYETHYLENE GLYCOL 3350 17 GM/SCOOP PO POWD: 17.0000 g | Freq: Every day | ORAL | 1 refills | Status: DC

## 2019-06-15 MED ORDER — HYDROCORTISONE (PERIANAL) 2.5 % EX CREA: 1.0000 "application " | TOPICAL_CREAM | Freq: Two times a day (BID) | CUTANEOUS | 0 refills | Status: DC

## 2019-06-15 MED FILL — POLYETHYLENE GLYCOL 3350 PO: 17 | 28 days supply | Qty: 476 | Fill #0

## 2019-06-15 MED FILL — PROCTOZONE-HC 2.5 % CREA: 2.5 | 15 days supply | Qty: 30 | Fill #0

## 2019-06-15 NOTE — Telephone Encounter (Signed)
Patient called requesting refill on MIraLax and hydrocortisone cream 2.5% and to be called to [facility] for him to pick up on Thursday.

## 2019-06-18 ENCOUNTER — Telehealth: Payer: Self-pay | Admitting: *Deleted

## 2019-06-18 ENCOUNTER — Inpatient Hospital Stay: Payer: Medicaid Other

## 2019-06-18 ENCOUNTER — Inpatient Hospital Stay: Payer: Medicaid Other | Admitting: Nurse Practitioner

## 2019-06-18 ENCOUNTER — Ambulatory Visit (HOSPITAL_COMMUNITY): Admission: RE | Admit: 2019-06-18 | Payer: Medicaid Other | Source: Ambulatory Visit

## 2019-06-18 DIAGNOSIS — C799 Secondary malignant neoplasm of unspecified site: Secondary | ICD-10-CM

## 2019-06-18 DIAGNOSIS — C181 Malignant neoplasm of appendix: Secondary | ICD-10-CM

## 2019-06-18 MED ORDER — POLYETHYLENE GLYCOL 3350 17 GM/SCOOP PO POWD: 17.0000 g | Freq: Every day | ORAL | 1 refills | Status: AC

## 2019-06-18 MED ORDER — HYDROCORTISONE (PERIANAL) 2.5 % EX CREA: 1.0000 "application " | TOPICAL_CREAM | Freq: Two times a day (BID) | CUTANEOUS | 0 refills | Status: AC

## 2019-06-18 NOTE — Telephone Encounter (Signed)
Patient called to cancel appointments today. He states he would like the next available appointments to reschedule these. Message sent to the schedulers for new appointments. He says he is not going to make it. He woke up with a fever of 100. He is in too much pain. He feels like the pain medication is helping. He is having anxiety really bad and does not feel like he can leave this house today.  Provider notified.

## 2019-06-22 ENCOUNTER — Emergency Department (HOSPITAL_COMMUNITY)
Admission: EM | Admit: 2019-06-22 | Discharge: 2019-06-22 | Discharge status: Against Medical Advice | Payer: Medicaid Other | Attending: Emergency Medicine | Admitting: Emergency Medicine

## 2019-06-22 ENCOUNTER — Emergency Department (HOSPITAL_COMMUNITY): Payer: Medicaid Other

## 2019-06-22 ENCOUNTER — Other Ambulatory Visit: Payer: Self-pay

## 2019-06-22 ENCOUNTER — Encounter (HOSPITAL_COMMUNITY): Payer: Self-pay | Admitting: Emergency Medicine

## 2019-06-22 DIAGNOSIS — F17210 Nicotine dependence, cigarettes, uncomplicated: Secondary | ICD-10-CM | POA: Diagnosis not present

## 2019-06-22 DIAGNOSIS — Z79899 Other long term (current) drug therapy: Secondary | ICD-10-CM | POA: Insufficient documentation

## 2019-06-22 DIAGNOSIS — K56601 Complete intestinal obstruction, unspecified as to cause: Secondary | ICD-10-CM | POA: Diagnosis not present

## 2019-06-22 DIAGNOSIS — R109 Unspecified abdominal pain: Secondary | ICD-10-CM | POA: Diagnosis present

## 2019-06-22 LAB — COMPREHENSIVE METABOLIC PANEL
ALT: 28 U/L (ref 0–44)
AST: 60 U/L — ABNORMAL HIGH (ref 15–41)
Albumin: 3.5 g/dL (ref 3.5–5.0)
Alkaline Phosphatase: 284 U/L — ABNORMAL HIGH (ref 38–126)
Anion gap: 13 (ref 5–15)
BUN: 16 mg/dL (ref 6–20)
CO2: 30 mmol/L (ref 22–32)
Calcium: 9.4 mg/dL (ref 8.9–10.3)
Chloride: 97 mmol/L — ABNORMAL LOW (ref 98–111)
Creatinine, Ser: 0.59 mg/dL — ABNORMAL LOW (ref 0.61–1.24)
GFR calc Af Amer: >60 mL/min (ref >60)
GFR calc non Af Amer: >60 mL/min (ref >60)
Glucose, Bld: 94 mg/dL (ref 70–99)
Potassium: 4.4 mmol/L (ref 3.5–5.1)
Sodium: 140 mmol/L (ref 135–145)
Total Bilirubin: 0.8 mg/dL (ref 0.3–1.2)
Total Protein: 8.5 g/dL — ABNORMAL HIGH (ref 6.5–8.1)

## 2019-06-22 LAB — CBC WITH DIFFERENTIAL/PLATELET
Abs Immature Granulocytes: 0.05 10*3/uL (ref 0.00–0.07)
Basophils Absolute: 0.1 10*3/uL (ref 0.0–0.1)
Basophils Relative: 1 %
Eosinophils Absolute: 0 10*3/uL (ref 0.0–0.5)
Eosinophils Relative: 0 %
HCT: 46.1 % (ref 39.0–52.0)
Hemoglobin: 14.7 g/dL (ref 13.0–17.0)
Immature Granulocytes: 0 %
Lymphocytes Relative: 15 %
Lymphs Abs: 1.9 10*3/uL (ref 0.7–4.0)
MCH: 30.3 pg (ref 26.0–34.0)
MCHC: 31.9 g/dL (ref 30.0–36.0)
MCV: 95.1 fL (ref 80.0–100.0)
Monocytes Absolute: 1.8 10*3/uL — ABNORMAL HIGH (ref 0.1–1.0)
Monocytes Relative: 14 %
Neutro Abs: 9.2 10*3/uL — ABNORMAL HIGH (ref 1.7–7.7)
Neutrophils Relative %: 70 %
Platelets: 857 10*3/uL — ABNORMAL HIGH (ref 150–400)
RBC: 4.85 MIL/uL (ref 4.22–5.81)
RDW: 15.2 % (ref 11.5–15.5)
WBC: 13 10*3/uL — ABNORMAL HIGH (ref 4.0–10.5)
nRBC: 0 % (ref 0.0–0.2)

## 2019-06-22 LAB — LIPASE, BLOOD: Lipase: 35 U/L (ref 11–51)

## 2019-06-22 MED ORDER — MORPHINE SULFATE (PF) 4 MG/ML IV SOLN
4.0000 mg | Freq: Once | INTRAVENOUS | Status: AC
  Administered 2019-06-22: 12:00:00 4 mg via INTRAMUSCULAR
  Filled 2019-06-22: qty 1

## 2019-06-22 MED ORDER — MORPHINE SULFATE (PF) 4 MG/ML IV SOLN
4.0000 mg | Freq: Once | INTRAVENOUS | Status: AC
  Administered 2019-06-22: 16:00:00 4 mg via INTRAVENOUS
  Filled 2019-06-22: qty 1

## 2019-06-22 MED ORDER — SODIUM CHLORIDE (PF) 0.9 % IJ SOLN
INTRAMUSCULAR | Status: AC
  Filled 2019-06-22: qty 50

## 2019-06-22 MED ORDER — IOHEXOL 300 MG/ML  SOLN
75.0000 mL | Freq: Once | INTRAMUSCULAR | Status: AC | PRN
  Administered 2019-06-22: 15:00:00 75 mL via INTRAVENOUS

## 2019-06-22 MED ORDER — MORPHINE SULFATE (PF) 4 MG/ML IV SOLN
4.0000 mg | Freq: Once | INTRAVENOUS | Status: AC
  Administered 2019-06-22: 17:00:00 4 mg via INTRAMUSCULAR
  Filled 2019-06-22: qty 1

## 2019-06-22 NOTE — ED Notes (Signed)
Pt states that he cannot urinate until he has a BM. Pt states that his "sigmoid is twisted", thus preventing any gas expulsion or BM, thus preventing urination. Pt states he is sitting with legs spread open hoping to produce gas.

## 2019-06-22 NOTE — ED Triage Notes (Signed)
Patient arrived by EMS. Patient c/o ABD pain due to cancer.   Patient took pain medication at home and experienced N/V.   EMS gave 100 mcg of Fentanyl for pain.

## 2019-06-22 NOTE — ED Notes (Signed)
Patient called on call bell for assistance with bedpan. On call bell, patient stated "I need a pan, I'm gonna 'doo doo', I'm gonna 'crap'." Explained to patient that I needed to get the bed pan out of the cabinet to help him. Patient stated "I think some came out." This writer reassured patient that that was okay and that we would clean him up. This writer proceeded to remove patient's undergarment to place patient on bed pan. Patient snapped at writer "you don't need to pull down the front, just the back, it's not coming out my pee pee hole." This Probation officer apologized and placed patient on bedpan and left room.

## 2019-06-22 NOTE — ED Notes (Addendum)
Went into pt's room to remove bedpan and pt complained about how it was positioned on him.  Pt says he is going to file a complaint.

## 2019-06-22 NOTE — ED Notes (Signed)
Patient leaving AMA. Patient signed [place] paperwork. Provider aware.

## 2019-06-22 NOTE — Consult Note (Signed)
[name] Consult/Admission Note  Derek Lloyd [birth year]  [id number].    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 Pt states pain is worse than his normal pain. Two episodes of bilious emesis over the last 2 weeks and one of those times was this am. Per Dr. Gearldine Shown note on 05/08/19, pt had been experiencing obstructive symptoms over the last month. "Systemic treatment options for the cancer are limited". Patient states he cannot be admitted today cause he needs to go an pain rent but he can come back tomorrow. He states if he does not go and pay rent today then he will not have a place to live when he is discharged from the hospital. Stools have been small, loose and not frequent. No other associated symptoms.   ROS:  Review of Systems  Constitutional: Negative for chills, diaphoresis and fever.  HENT: Negative for sore throat.   Respiratory: Negative for cough and shortness of breath.   Cardiovascular: Negative for chest pain.  Gastrointestinal: Positive for abdominal pain, constipation, nausea and vomiting. Negative for blood in stool and diarrhea.  Genitourinary: Negative for dysuria.  Skin: Negative for rash.  Neurological: Negative for dizziness and loss of consciousness.  All other systems reviewed and are negative.    Family History  Problem Relation Age of Onset  . Prostate cancer Paternal Grandfather   . Crohn's disease Neg Hx     Past Medical History:  Diagnosis Date  . Abdominal pain 09/2017  . Anxiety attack   . apendix ca dx'd 09/2018   with intra abd mets  . Headache    "frequently" (08/14/2017)  . Heart murmur    "when I was a kid" (08/14/2017)  . Small bowel obstruction ([place]) 08/13/2017    Past Surgical History:   Procedure Laterality Date  . BIOPSY  10/02/2017   Procedure: BIOPSY;  Surgeon: [name], MD;  Location: [facility] ENDOSCOPY;  Service: Gastroenterology;;  . COLONOSCOPY WITH PROPOFOL N/A 10/02/2017   Procedure: COLONOSCOPY WITH PROPOFOL;  Surgeon: [name], MD;  Location: [facility];  Service: Gastroenterology;  Laterality: N/A;  . FINGER SURGERY Left    "thumb"  . IR IMAGING GUIDED PORT INSERTION  11/01/2017  . IR PARACENTESIS  09/24/2017  . IR US GUIDE VASC ACCESS RIGHT  11/01/2017  . LEFT HEART CATH AND CORONARY ANGIOGRAPHY N/A 01/02/2019   Procedure: LEFT HEART CATH AND CORONARY ANGIOGRAPHY;  Surgeon: Wellington Hampshire, MD;  Location: [facility];  Service: Cardiovascular;  Laterality: N/A;  . POLYPECTOMY N/A 10/02/2017   Procedure: POLYPECTOMY;  Surgeon: [name], MD;  Location: [facility];  Service: Gastroenterology;  Laterality: N/A;  . SUBMUCOSAL INJECTION  10/02/2017   Procedure: SUBMUCOSAL INJECTION;  Surgeon: [name], MD;  Location[facility] ENDOSCOPY;  Service: Gastroenterology;;  . TYMPANOSTOMY TUBE PLACEMENT Bilateral     Social History:  reports that he has been smoking cigarettes. He has been smoking about 1.00 pack per day. He has never used smokeless tobacco. He reports current alcohol use. He reports current drug use. Drug: Marijuana.  Allergies:  Allergies  Allergen Reactions  . Piperacillin-Tazobactam In Dex Rash    Noted on May 2019 admission.  Tolerates Cephalosporins.    (Not in a hospital admission)   Blood pressure 110/71, pulse (!) 106,  temperature 98.1 F (36.7 C), resp. rate (!) 26, height 5' 10"  (1.778 m), weight 43.1 kg, SpO2 97 %.  Physical Exam Vitals and nursing note reviewed.  Constitutional:      General: He is not in acute distress.    Appearance: He is underweight. He is ill-appearing. He is not toxic-appearing or diaphoretic.  HENT:     Head: Normocephalic and atraumatic.     Nose: Nose normal.     Mouth/Throat:     Comments: Pt  wearing mask Eyes:     General: No scleral icterus.       Right eye: No discharge.        Left eye: No discharge.     Conjunctiva/sclera: Conjunctivae normal.     Pupils: Pupils are equal, round, and reactive to light.  Cardiovascular:     Rate and Rhythm: Tachycardia present.  Pulmonary:     Effort: Pulmonary effort is normal. No respiratory distress.  Abdominal:     General: Bowel sounds are normal. There is no distension.     Palpations: Abdomen is not rigid.     Tenderness: There is abdominal tenderness (left hemiabdomen). There is guarding.     Comments: Abdomen is firm, not distended, with generalized TTP but worse in the hemiabdomen. Midline scar noted.  Musculoskeletal:        General: No tenderness or deformity. Normal range of motion.     Cervical back: Normal range of motion and neck supple.  Skin:    General: Skin is warm and dry.     Findings: No rash.  Neurological:     Mental Status: He is alert and oriented to person, place, and time.     Results for orders placed or performed during the hospital encounter of 06/22/19 (from the past 48 hour(s))  Lipase, blood     Status: None   Collection Time: 06/22/19 12:59 PM  Result Value Ref Range   Lipase 35 11 - 51 U/L    Comment: Performed at [facility], [place] [address], [place], [place] [address]  CBC with Differential     Status: Abnormal   Collection Time: 06/22/19 12:59 PM  Result Value Ref Range   WBC 13.0 (H) 4.0 - 10.5 K/uL    Comment: WHITE COUNT CONFIRMED ON SMEAR   RBC 4.85 4.22 - 5.81 MIL/uL   Hemoglobin 14.7 13.0 - 17.0 g/dL   HCT 46.1 39.0 - 52.0 %   MCV 95.1 80.0 - 100.0 fL   MCH 30.3 26.0 - 34.0 pg   MCHC 31.9 30.0 - 36.0 g/dL   RDW 15.2 11.5 - 15.5 %   Platelets 857 (H) 150 - 400 K/uL   nRBC 0.0 0.0 - 0.2 %   Neutrophils Relative % 70 %   Neutro Abs 9.2 (H) 1.7 - 7.7 K/uL   Lymphocytes Relative 15 %   Lymphs Abs 1.9 0.7 - 4.0 K/uL   Monocytes Relative 14 %   Monocytes  Absolute 1.8 (H) 0.1 - 1.0 K/uL   Eosinophils Relative 0 %   Eosinophils Absolute 0.0 0.0 - 0.5 K/uL   Basophils Relative 1 %   Basophils Absolute 0.1 0.0 - 0.1 K/uL   Immature Granulocytes 0 %   Abs Immature Granulocytes 0.05 0.00 - 0.07 K/uL    Comment: Performed at [facility], [place] [address], [place], [place] [address]  Comprehensive metabolic panel     Status: Abnormal   Collection Time: 06/22/19 12:59 PM  Result Value Ref Range   Sodium 140 135 - 145 mmol/L   Potassium 4.4 3.5 - 5.1 mmol/L   Chloride 97 (L) 98 - 111 mmol/L   CO2 30 22 - 32 mmol/L   Glucose, Bld 94 70 - 99 mg/dL   BUN 16 6 - 20 mg/dL   Creatinine, Ser 0.59 (L) 0.61 - 1.24 mg/dL   Calcium 9.4 8.9 - 10.3 mg/dL   Total Protein 8.5 (H) 6.5 - 8.1 g/dL   Albumin 3.5 3.5 - 5.0 g/dL   AST 60 (H) 15 - 41 U/L   ALT 28 0 - 44 U/L   Alkaline Phosphatase 284 (H) 38 - 126 U/L   Total Bilirubin 0.8 0.3 - 1.2 mg/dL   GFR calc non Af Amer >60 >60 mL/min   GFR calc Af Amer >60 >60 mL/min   Anion gap 13 5 - 15    Comment: Performed at [facility], [place] [address], [place], [place] [address]   DG Chest 2 View  Result Date: 06/22/2019 CLINICAL DATA:  cough, shortness of breath, nausea, history of appendiceal carcinoma EXAM: CHEST - 2 VIEW COMPARISON:  12/17/2018, 03/30/2019 FINDINGS: Frontal and lateral views of the chest demonstrates stable right chest wall port. Stable moderate left pleural effusion since prior CT. Left basilar consolidation consistent with compressive atelectasis. Trace right pleural effusion unchanged since prior CT. Stable patchy consolidation along the right hemidiaphragm consistent with atelectasis or scar. No pneumothorax. No acute bony abnormalities. IMPRESSION: 1. Stable bilateral pleural effusions and bibasilar consolidation, left greater than right. Electronically Signed   By: Randa Ngo M.D.   On: 06/22/2019 12:18   CT ABDOMEN PELVIS W CONTRAST  Result Date:  06/22/2019 CLINICAL DATA:  Abdominal distension. History of carcinoma of the appendix. EXAM: CT ABDOMEN AND PELVIS WITH CONTRAST TECHNIQUE: Multidetector CT imaging of the abdomen and pelvis was performed using the standard protocol following bolus administration of intravenous contrast. CONTRAST:  24m OMNIPAQUE IOHEXOL 300 MG/ML  SOLN COMPARISON:  03/30/2019 FINDINGS: Lower chest: A large left pleural effusion which now appears newly loculated compared with 03/30/2019. Pleural nodularity overlying the right lung is noted. Hepatobiliary: No suspicious liver lesion identified. New mild intrahepatic bile duct dilatation. The common bile duct is also increased in caliber compared with the previous exam measuring 9 mm. Previously 5 mm. Pancreas: Unremarkable. No pancreatic ductal dilatation or surrounding inflammatory changes. Spleen: Splenectomy Adrenals/Urinary Tract: Normal appearance of the adrenal glands. Bilateral pelvocaliectasis. No kidney mass identified. Urinary bladder unremarkable. Stomach/Bowel: The stomach appears nondistended. Previous right hemicolectomy with enterocolonic anastomosis. There is increase caliber of the small and large bowel loops up to the anastomosis at the rectosigmoid colon. Soft tissue mass just distal to the anastomotic suture line is identified measuring approximately 5.9 by 3.1 cm, image 79/4 74/4 . Previously 4.0 x 3.9 cm. Vascular/Lymphatic: Mild aortic atherosclerosis. No aneurysm. No retroperitoneal or pelvic adenopathy. Reproductive: Prostate is unremarkable. Other: Interval decrease in volume of previously noted fluid within the left upper quadrant of the abdomen. Musculoskeletal: IMPRESSION: 1. Increase caliber of the small and large bowel loops up to the anastomosis at the rectosigmoid colon. Findings are concerning for distal bowel obstruction secondary to recurrent tumor. 2. Increase caliber of the common bile duct and intrahepatic bile ducts. 3. Increase in size of large  left pleural effusion which appears newly loculated compared with 03/30/2019. Pleural nodularity overlying the right lung is noted. Imaging findings are compatible with pleural spread of disease. 4. Decrease in  volume of fluid within the left upper quadrant of the abdomen. Electronically Signed   By: Kerby Moors M.D.   On: 06/22/2019 15:13      Assessment/Plan Active Problems:   * No active hospital problems. *  Appendiceal carcinoma with metastatic disease and carcinomatosis Likely distal bowel obstruction 2/2 recurrent disease - CT scan findings were Increase caliber of the small and large bowel loops up to the anastomosis at the rectosigmoid colon. Findings are concerning for distal bowel obstruction secondary to recurrent tumor. - it is unlikely that this patient would be a surgical candidate  - recommended medical admission but pt is declining admission at this time 2/2 personal reasons.  - if he changes his mind and he is admitted then we will follow him. I discussed with EDP.    Kalman Drape, [facility] 06/22/2019, 4:37 PM Please see amion for pager for the following: Cristine Polio, & Friday 7:00am - 4:30pm Thursdays 7:00am -11:30am

## 2019-06-22 NOTE — ED Provider Notes (Signed)
[place] DEPT Provider Note   CSN: [id number] Arrival date & time: 06/22/19  1103     History Chief Complaint  Patient presents with   Abdominal Pain    Derek Lloyd is a 49 y.o. male with a past medical history significant for primary appendiceal adenocarcinoma, colon cancer with peritoneal dissemination, anxiety, and hx. Small bowel obstruction who presents to the ED via EMS due to severe, 10/10 abdominal pain associated with 2 episodes of bilious emesis with streaks of blood. Patient notes he took his routine oxycodone with no relief. He was given 137mg of Fentanyl en route to the ED with mild relief. Patient denies diarrhea, but notes his bowel movements have changed over the past few months and resemble "fudge". He also admits to increased phlegm production for numerous months. Patient denies chest pain, but notes he feels pressure from his abdomen pushing upwards due to reflux. Patient denies recent illness. Denies fever and chills.    Chart reviewed. Patient was seen on 06/03/19 with Dr[facility]is [facility] where he was noted to have complaints consistent with a partial bowel obstruction.  Past Medical History:  Diagnosis Date   Abdominal pain 09/2017   Anxiety attack    apendix ca dx'd 09/2018   with intra abd mets   Headache    "frequently" (08/14/2017)   Heart murmur    "when I was a kid" (08/14/2017)   Small bowel obstruction ([facility] 08/13/2017    Patient Active Problem List   Diagnosis Date Noted   SVT (supraventricular tachycardia) ([facility] 01/02/2019   Unstable angina (HCC)    Atrial tachycardia ([facility]    Appendix carcinoma ([facility] 12/19/2018   Port-A-Cath in place 12/02/2017   Primary appendiceal adenocarcinoma ([facility] 11/27/2017   Metastatic carcinoma ([facility] 10/24/2017   Goals of care, counseling/discussion 10/24/2017   Abdominal pain 09/24/2017   Ascites 09/24/2017   SBO (small bowel obstruction) ([facility] 08/13/2017    Right lower quadrant abdominal abscess ([facility] 01/26/2017    Past Surgical History:  Procedure Laterality Date   BIOPSY  10/02/2017   Procedure: BIOPSY;  Surgeon: KRonnette Juniper MD;  [facility]  Service: Gastroenterology;;   COLONOSCOPY WITH PROPOFOL N/A 10/02/2017   Procedure: COLONOSCOPY WITH PROPOFOL;  Surgeon: KRonnette Juniper MD;  [facility]  Service: Gastroenterology;  Laterality: N/A;   FINGER SURGERY Left    "thumb"   IR IMAGING GUIDED PORT INSERTION  11/01/2017   IR PARACENTESIS  09/24/2017   IR [place] ACCESS RIGHT  11/01/2017   LEFT HEART CATH AND CORONARY ANGIOGRAPHY N/A 01/02/2019   Procedure: LEFT HEART CATH AND CORONARY ANGIOGRAPHY;  Surgeon: AWellington Hampshire MD;  [facility];  Service: Cardiovascular;  Laterality: N/A;   POLYPECTOMY N/A 10/02/2017   Procedure: POLYPECTOMY;  Surgeon: KRonnette Juniper MD;  [place] [facility]  Service: Gastroenterology;  Laterality: N/A;   SUBMUCOSAL INJECTION  10/02/2017   Procedure: SUBMUCOSAL INJECTION;  Surgeon: KRonnette Juniper MD;  [facility];  Service: Gastroenterology;;   TYMPANOSTOMY TUBE PLACEMENT Bilateral        Family History  Problem Relation Age of Onset   Prostate cancer Paternal Grandfather    Crohn's disease Neg Hx     Social History   Tobacco Use   Smoking status: Current Some Day Smoker    Packs/day: 1.00    Types: Cigarettes    Last attempt to quit: 09/28/2016    Years since quitting: 2.7   Smokeless  tobacco: Never Used   Tobacco comment: 08/14/2017 "I quit; can't tell you when I quit or when I started"  Substance Use Topics   Alcohol use: Yes    Comment: 08/14/2017 "I drink when I feel like it; nothing recently"   Drug use: Yes    Types: Marijuana    Home Medications Prior to Admission medications   Medication Sig Start Date End Date Taking? Authorizing Provider  ALPRAZolam Duanne Moron) 0.5 MG tablet Take 1 tablet (0.5 mg total) by mouth 2  (two) times daily as needed for anxiety. 05/25/19  Yes Owens Shark, NP  hydrocortisone (ANUSOL-HC) 2.5 % rectal cream Place 1 application rectally 2 (two) times daily. 06/18/19  Yes Ladell Pier, MD  loperamide (IMODIUM) 2 MG capsule Take 2 capsules (4 mg total) by mouth as needed for diarrhea or loose stools. Maximum of 8 capsules/day 01/03/19  Yes Bhagat, Bhavinkumar, PA  oxyCODONE-acetaminophen (PERCOCET/ROXICET) 5-325 MG tablet Take 1 tablet by mouth every 4 (four) hours as needed for severe pain. 05/25/19  Yes Owens Shark, NP  polyethylene glycol powder (GLYCOLAX/MIRALAX) 17 GM/SCOOP powder Take 17 g by mouth daily. 06/18/19  Yes Ladell Pier, MD  aspirin EC 81 MG tablet Take 1 tablet (81 mg total) by mouth daily. Patient not taking: Reported on 06/22/2019 02/09/19 02/09/20  Ladell Pier, MD  carvedilol (COREG) 3.125 MG tablet Take 1 tablet (3.125 mg total) by mouth 2 (two) times daily with a meal. Patient not taking: Reported on 06/22/2019 01/03/19   Leanor Kail, PA  HYDROcodone-acetaminophen (NORCO/VICODIN) 5-325 MG tablet Take 1-2 tablets by mouth every 6 (six) hours as needed for moderate pain. Patient not taking: Reported on 06/22/2019 03/30/19   Owens Shark, NP  omeprazole (PRILOSEC) 20 MG capsule Take 1 capsule (20 mg total) by mouth daily. Patient not taking: Reported on 06/22/2019 01/03/19   Leanor Kail, PA  ondansetron (ZOFRAN) 8 MG tablet Take 1 tablet (8 mg total) by mouth every 8 (eight) hours as needed for nausea or vomiting. Patient not taking: Reported on 06/22/2019 01/03/19   Leanor Kail, PA  prochlorperazine (COMPAZINE) 10 MG tablet Take 1 tablet (10 mg total) by mouth every 6 (six) hours as needed for nausea. Patient not taking: Reported on 06/22/2019 12/31/18   June Leap L, DO    Allergies    Piperacillin-tazobactam in dex  Review of Systems   Review of Systems  Constitutional: Negative for chills and fever.  Respiratory: Positive for cough.  Negative for shortness of breath.   Cardiovascular: Negative for chest pain and palpitations.  Gastrointestinal: Positive for abdominal pain, nausea and vomiting. Negative for diarrhea.  All other systems reviewed and are negative.   Physical Exam Updated Vital Signs BP 108/81    Pulse (!) 114    Temp 98.1 F (36.7 C)    Resp (!) 30    Ht 5' 10" (1.778 m)    Wt 43.1 kg    SpO2 97%    BMI 13.63 kg/m   Physical Exam Vitals and nursing note reviewed.  Constitutional:      General: He is not in acute distress.    Appearance: He is ill-appearing.     Comments: Chronically ill appearing  HENT:     Head: Normocephalic.  Eyes:     Pupils: Pupils are equal, round, and reactive to light.  Cardiovascular:     Rate and Rhythm: Normal rate and regular rhythm.     Pulses: Normal pulses.  Heart sounds: Normal heart sounds. No murmur. No friction rub. No gallop.   Pulmonary:     Effort: Pulmonary effort is normal.     Breath sounds: Normal breath sounds.  Abdominal:     General: Abdomen is flat. There is distension.     Tenderness: There is abdominal tenderness. There is no guarding or rebound.     Comments: Hyperactive bowel sounds. Abdomen firm with diffuse tenderness to palpation, most significant in LLQ.   Musculoskeletal:     Cervical back: Neck supple.     Comments: Able to move all 4 extremities without difficulty.   Skin:    General: Skin is warm and dry.     Coloration: Skin is pale.  Neurological:     General: No focal deficit present.     Mental Status: He is alert.  Psychiatric:        Mood and Affect: Affect is angry.     ED Results / Procedures / Treatments   Labs (all labs ordered are listed, but only abnormal results are displayed) Labs Reviewed  CBC WITH DIFFERENTIAL/PLATELET - Abnormal; Notable for the following components:      Result Value   WBC 13.0 (*)    Platelets 857 (*)    Neutro Abs 9.2 (*)    Monocytes Absolute 1.8 (*)    All other components  within normal limits  COMPREHENSIVE METABOLIC PANEL - Abnormal; Notable for the following components:   Chloride 97 (*)    Creatinine, Ser 0.59 (*)    Total Protein 8.5 (*)    AST 60 (*)    Alkaline Phosphatase 284 (*)    All other components within normal limits  LIPASE, BLOOD  URINALYSIS, ROUTINE W REFLEX MICROSCOPIC    EKG None  Radiology DG Chest 2 View  Result Date: 06/22/2019 CLINICAL DATA:  cough, shortness of breath, nausea, history of appendiceal carcinoma EXAM: CHEST - 2 VIEW COMPARISON:  12/17/2018, 03/30/2019 FINDINGS: Frontal and lateral views of the chest demonstrates stable right chest wall port. Stable moderate left pleural effusion since prior CT. Left basilar consolidation consistent with compressive atelectasis. Trace right pleural effusion unchanged since prior CT. Stable patchy consolidation along the right hemidiaphragm consistent with atelectasis or scar. No pneumothorax. No acute bony abnormalities. IMPRESSION: 1. Stable bilateral pleural effusions and bibasilar consolidation, left greater than right. Electronically Signed   By: Randa Ngo M.D.   On: 06/22/2019 12:18   CT ABDOMEN PELVIS W CONTRAST  Result Date: 06/22/2019 CLINICAL DATA:  Abdominal distension. History of carcinoma of the appendix. EXAM: CT ABDOMEN AND PELVIS WITH CONTRAST TECHNIQUE: Multidetector CT imaging of the abdomen and pelvis was performed using the standard protocol following bolus administration of intravenous contrast. CONTRAST:  17m OMNIPAQUE IOHEXOL 300 MG/ML  SOLN COMPARISON:  03/30/2019 FINDINGS: Lower chest: A large left pleural effusion which now appears newly loculated compared with 03/30/2019. Pleural nodularity overlying the right lung is noted. Hepatobiliary: No suspicious liver lesion identified. New mild intrahepatic bile duct dilatation. The common bile duct is also increased in caliber compared with the previous exam measuring 9 mm. Previously 5 mm. Pancreas: Unremarkable. No  pancreatic ductal dilatation or surrounding inflammatory changes. Spleen: Splenectomy Adrenals/Urinary Tract: Normal appearance of the adrenal glands. Bilateral pelvocaliectasis. No kidney mass identified. Urinary bladder unremarkable. Stomach/Bowel: The stomach appears nondistended. Previous right hemicolectomy with enterocolonic anastomosis. There is increase caliber of the small and large bowel loops up to the anastomosis at the rectosigmoid colon. Soft tissue mass just distal  to the anastomotic suture line is identified measuring approximately 5.9 by 3.1 cm, image 79/4 74/4 . Previously 4.0 x 3.9 cm. Vascular/Lymphatic: Mild aortic atherosclerosis. No aneurysm. No retroperitoneal or pelvic adenopathy. Reproductive: Prostate is unremarkable. Other: Interval decrease in volume of previously noted fluid within the left upper quadrant of the abdomen. Musculoskeletal: IMPRESSION: 1. Increase caliber of the small and large bowel loops up to the anastomosis at the rectosigmoid colon. Findings are concerning for distal bowel obstruction secondary to recurrent tumor. 2. Increase caliber of the common bile duct and intrahepatic bile ducts. 3. Increase in size of large left pleural effusion which appears newly loculated compared with 03/30/2019. Pleural nodularity overlying the right lung is noted. Imaging findings are compatible with pleural spread of disease. 4. Decrease in volume of fluid within the left upper quadrant of the abdomen. Electronically Signed   By: Kerby Moors M.D.   On: 06/22/2019 15:13    Procedures Procedures (including critical care time)  Medications Ordered in ED Medications  sodium chloride (PF) 0.9 % injection (has no administration in time range)  morphine 4 MG/ML injection 4 mg (4 mg Intramuscular Given 06/22/19 1222)  iohexol (OMNIPAQUE) 300 MG/ML solution 75 mL (75 mLs Intravenous Contrast Given 06/22/19 1433)  morphine 4 MG/ML injection 4 mg (4 mg Intravenous Given 06/22/19 1624)    morphine 4 MG/ML injection 4 mg (4 mg Intramuscular Given 06/22/19 1724)    ED Course  I have reviewed the triage vital signs and the nursing notes.  Pertinent labs & imaging results that were available during my care of the patient were reviewed by me and considered in my medical decision making (see chart for details).  Clinical Course as of Jun 21 2008  Mon Jun 22, 2019  1316 Platelets(!): 857 [CA]  1426 Alkaline Phosphatase(!): 284 [CA]  1612 Spoke to Jackson Latino, [place] with general surgery who is going to speak with the general surgeon to see if they will admit patient vs. Medicine admission   [CA]  1659 Informed by surgery that patient would like to leave AMA and come back in the morning after he pays his rent.    [HR]  4163 Spoke to Jackson Latino, PA-C who evaluated patient at bedside who recommends medicine admission   [CA]    Clinical Course User Index [CA] Karie Kirks   MDM Rules/Calculators/A&P                     49 year old male presents to the ED via EMS due to severe abdominal pain associated with bilious emesis. Patient has a history of primary appendiceal adenocarcinoma and colon cancer with peritoneal dissemination.  Upon arrival, patient tachycardic at 104, but otherwise reassuring vitals.  Patient appears chronically ill.  Abdomen firm, distended with diffuse tenderness palpation significant in the lower left quadrant.  Concern for a bowel obstruction given patient's history.  Will obtain routine labs and CT scan.  CBC significant for leukocytosis at 13 and elevated platelets at 857.  CMP significant for elevated alk phos at 284 and elevated AST at 60. Lipase normal. Doubt pancreatitis. CXR personally reviewed which demonstrates: 1. Stable bilateral pleural effusions and bibasilar consolidation,  left greater than right.   CT scan personally reviewed which demonstrates: 1. Increase caliber of the small and large bowel loops up to the  anastomosis at  the rectosigmoid colon. Findings are concerning for  distal bowel obstruction secondary to recurrent tumor.  2. Increase caliber of  the common bile duct and intrahepatic bile  ducts.  3. Increase in size of large left pleural effusion which appears  newly loculated compared with 03/30/2019. Pleural nodularity  overlying the right lung is noted. Imaging findings are compatible  with pleural spread of disease.  4. Decrease in volume of fluid within the left upper quadrant of the  abdomen.   Discussed case with Dr. Sedonia Small who evaluated patient at bedside and agrees with assessment and plan. Spoke to general surgery. See notes above. Informed by general surgery PA that patient would like to leave AMA and come back tomorrow. We discussed the nature and purpose, risks and benefits, as well as, the alternatives of treatment. Time was given to allow the opportunity to ask questions and consider their options, and after the discussion, the patient decided to refuse the offerred treatment. The patient was informed that refusal could lead to, but was not limited to, death, permanent disability, or severe pain. Prior to refusing, I determined that the patient had the capacity to make their decision and understood the consequences of that decision. After refusal, I made every reasonable opportunity to treat them to the best of my ability.  The patient was notified that they may return to the emergency department at any time for further treatment.   Final Clinical Impression(s) / ED Diagnoses Final diagnoses:  Complete intestinal obstruction, unspecified cause [facility])    Rx / DC Orders ED Discharge Orders    None       Karie Kirks 06/22/19 2012    Maudie Flakes, MD 06/23/19 [phone]

## 2019-06-23 ENCOUNTER — Emergency Department (HOSPITAL_COMMUNITY)
Admission: EM | Admit: 2019-06-23 | Discharge: 2019-06-23 | Disposition: A | Discharge status: Home / Self Care | Payer: Medicaid Other | Attending: Emergency Medicine | Admitting: Emergency Medicine

## 2019-06-23 ENCOUNTER — Emergency Department (HOSPITAL_COMMUNITY): Payer: Medicaid Other

## 2019-06-23 ENCOUNTER — Encounter (HOSPITAL_COMMUNITY): Payer: Self-pay | Admitting: Emergency Medicine

## 2019-06-23 ENCOUNTER — Telehealth: Payer: Self-pay | Admitting: *Deleted

## 2019-06-23 DIAGNOSIS — R1032 Left lower quadrant pain: Secondary | ICD-10-CM | POA: Diagnosis not present

## 2019-06-23 DIAGNOSIS — R110 Nausea: Secondary | ICD-10-CM | POA: Insufficient documentation

## 2019-06-23 DIAGNOSIS — F17210 Nicotine dependence, cigarettes, uncomplicated: Secondary | ICD-10-CM | POA: Diagnosis not present

## 2019-06-23 DIAGNOSIS — C181 Malignant neoplasm of appendix: Secondary | ICD-10-CM

## 2019-06-23 LAB — CBC
HCT: 34.6 % — ABNORMAL LOW (ref 39.0–52.0)
Hemoglobin: 11.1 g/dL — ABNORMAL LOW (ref 13.0–17.0)
MCH: 30.6 pg (ref 26.0–34.0)
MCHC: 32.1 g/dL (ref 30.0–36.0)
MCV: 95.3 fL (ref 80.0–100.0)
Platelets: 668 10*3/uL — ABNORMAL HIGH (ref 150–400)
RBC: 3.63 MIL/uL — ABNORMAL LOW (ref 4.22–5.81)
RDW: 15.2 % (ref 11.5–15.5)
WBC: 9 10*3/uL (ref 4.0–10.5)
nRBC: 0 % (ref 0.0–0.2)

## 2019-06-23 LAB — COMPREHENSIVE METABOLIC PANEL
ALT: 460 U/L — ABNORMAL HIGH (ref 0–44)
AST: 500 U/L — ABNORMAL HIGH (ref 15–41)
Albumin: 3 g/dL — ABNORMAL LOW (ref 3.5–5.0)
Alkaline Phosphatase: 874 U/L — ABNORMAL HIGH (ref 38–126)
Anion gap: 11 (ref 5–15)
BUN: 15 mg/dL (ref 6–20)
CO2: 25 mmol/L (ref 22–32)
Calcium: 8.4 mg/dL — ABNORMAL LOW (ref 8.9–10.3)
Chloride: 100 mmol/L (ref 98–111)
Creatinine, Ser: 0.6 mg/dL — ABNORMAL LOW (ref 0.61–1.24)
GFR calc Af Amer: >60 mL/min (ref >60)
GFR calc non Af Amer: >60 mL/min (ref >60)
Glucose, Bld: 125 mg/dL — ABNORMAL HIGH (ref 70–99)
Potassium: 3.8 mmol/L (ref 3.5–5.1)
Sodium: 136 mmol/L (ref 135–145)
Total Bilirubin: 0.9 mg/dL (ref 0.3–1.2)
Total Protein: 7 g/dL (ref 6.5–8.1)

## 2019-06-23 LAB — LIPASE, BLOOD: Lipase: 19 U/L (ref 11–51)

## 2019-06-23 MED ORDER — HYDROMORPHONE HCL 1 MG/ML IJ SOLN
1.0000 mg | Freq: Once | INTRAMUSCULAR | Status: AC
  Administered 2019-06-23: 1 mg via INTRAVENOUS
  Filled 2019-06-23: qty 1

## 2019-06-23 MED ORDER — OXYCODONE HCL 5 MG PO TABS: 5.0000 mg | ORAL_TABLET | ORAL | 0 refills | Status: AC | PRN

## 2019-06-23 MED ORDER — ONDANSETRON HCL 4 MG/2ML IJ SOLN
4.0000 mg | Freq: Once | INTRAMUSCULAR | Status: AC
  Administered 2019-06-23: 4 mg via INTRAVENOUS
  Filled 2019-06-23: qty 2

## 2019-06-23 MED ORDER — ALUM & MAG HYDROXIDE-SIMETH 200-200-20 MG/5ML PO SUSP
30.0000 mL | Freq: Once | ORAL | Status: AC
  Administered 2019-06-23: 30 mL via ORAL
  Filled 2019-06-23: qty 30

## 2019-06-23 MED ORDER — SODIUM CHLORIDE 0.9% FLUSH
3.0000 mL | Freq: Once | INTRAVENOUS | Status: AC
  Administered 2019-06-23: 3 mL via INTRAVENOUS

## 2019-06-23 MED ORDER — ONDANSETRON 4 MG PO TBDP
4.0000 mg | ORAL_TABLET | Freq: Once | ORAL | Status: AC
  Administered 2019-06-23: 4 mg via ORAL
  Filled 2019-06-23: qty 1

## 2019-06-23 MED ORDER — SODIUM CHLORIDE 0.9 % IV SOLN: INTRAVENOUS | Status: DC

## 2019-06-23 MED ORDER — LIDOCAINE VISCOUS HCL 2 % MT SOLN
15.0000 mL | Freq: Once | OROMUCOSAL | Status: AC
  Administered 2019-06-23: 15 mL via ORAL
  Filled 2019-06-23: qty 15

## 2019-06-23 MED ORDER — HEPARIN SOD (PORK) LOCK FLUSH 100 UNIT/ML IV SOLN
500.0000 [IU] | Freq: Once | INTRAVENOUS | Status: AC
  Administered 2019-06-23: 500 [IU]
  Filled 2019-06-23: qty 5

## 2019-06-23 NOTE — ED Notes (Signed)
Delay in DC due to pt reporting nausea and indigestion from eating [place] sandwich. Pt states " I ate it too fast"

## 2019-06-23 NOTE — ED Notes (Signed)
Pt reminded staff that he cannot void when he has a bowel obstruction.

## 2019-06-23 NOTE — ED Provider Notes (Signed)
[place] DEPT Provider Note   CSN: [id number] Arrival date & time: 06/23/19  1122     History Chief Complaint  Patient presents with   Abdominal Pain   Nausea    Derek Lloyd is a 49 y.o. male.  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  Yesterday patient had a CAT scan that showed concern for worsening obstruction.  Patient has known chronic partial obstruction due to the mass.  Patient however could not stay and chose to leave [place].  He states that overnight he was able to have an extremely large bowel movement and the distention in his abdomen has completely resolved.  He still has the ongoing chronic pain in the left lower quadrant that has been constant since his known cancer but he is no longer vomiting and states he is hungry and requesting something to eat.  He states he returned today just because somebody told him he should come back.  He also took his last pain pill today.  The history is provided by the patient.  Abdominal Pain Pain location:  LLQ Pain quality: cramping, gnawing and sharp   Pain radiates to:  Does not radiate Pain severity:  Severe Onset quality:  Gradual Duration: months. 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   Metastatic carcinoma ([place]) 10/24/2017   Goals of care, counseling/discussion 10/24/2017   Abdominal pain 09/24/2017   Ascites 09/24/2017   SBO (small bowel obstruction) ([place]) 08/13/2017   Right lower quadrant abdominal abscess ([place]) 01/26/2017    Past Surgical History:  Procedure Laterality Date   BIOPSY  10/02/2017   Procedure: BIOPSY;  Surgeon: [name], MD;  Location: [facility];  Service: Gastroenterology;;   COLONOSCOPY WITH PROPOFOL N/A 10/02/2017   Procedure: COLONOSCOPY WITH PROPOFOL;  Surgeon: [name], MD;  Location: [facility];  Service: Gastroenterology;  Laterality: N/A;   FINGER SURGERY Left    "thumb"   IR IMAGING GUIDED PORT INSERTION  11/01/2017   IR PARACENTESIS  09/24/2017   IR US GUIDE VASC ACCESS RIGHT  11/01/2017   LEFT HEART CATH AND CORONARY ANGIOGRAPHY N/A 01/02/2019   Procedure: LEFT HEART CATH AND CORONARY ANGIOGRAPHY;  Surgeon: Wellington Hampshire, MD;  Location: [facility];  Service: Cardiovascular;  Laterality: N/A;   POLYPECTOMY N/A 10/02/2017   Procedure: POLYPECTOMY;  Surgeon: [name], MD;  Location: [facility];  Service: Gastroenterology;  Laterality: N/A;   SUBMUCOSAL INJECTION  10/02/2017   Procedure: SUBMUCOSAL INJECTION;  Surgeon: [name], MD;  Location: [facility];  Service: Gastroenterology;;   TYMPANOSTOMY TUBE PLACEMENT Bilateral        Family History  Problem Relation Age of Onset  Prostate cancer Paternal Grandfather    Crohn's disease Neg Hx     Social History   Tobacco Use   Smoking status: Current Some Day Smoker    Packs/day: 1.00    Types: Cigarettes    Last attempt to quit: 09/28/2016    Years since quitting: 2.7   Smokeless tobacco: Never Used   Tobacco comment: 08/14/2017 "I quit; can't tell you when I quit or when I started"  Substance Use Topics   Alcohol use: Yes    Comment: 08/14/2017 "I drink when I  feel like it; nothing recently"   Drug use: Yes    Types: Marijuana    Home Medications Prior to Admission medications   Medication Sig Start Date End Date Taking? Authorizing Provider  ALPRAZolam Duanne Moron) 0.5 MG tablet Take 1 tablet (0.5 mg total) by mouth 2 (two) times daily as needed for anxiety. 05/25/19   Owens Shark, NP  aspirin EC 81 MG tablet Take 1 tablet (81 mg total) by mouth daily. Patient not taking: Reported on 06/22/2019 02/09/19 02/09/20  Ladell Pier, MD  carvedilol (COREG) 3.125 MG tablet Take 1 tablet (3.125 mg total) by mouth 2 (two) times daily with a meal. Patient not taking: Reported on 06/22/2019 01/03/19   Leanor Kail, PA  HYDROcodone-acetaminophen (NORCO/VICODIN) 5-325 MG tablet Take 1-2 tablets by mouth every 6 (six) hours as needed for moderate pain. Patient not taking: Reported on 06/22/2019 03/30/19   Owens Shark, NP  hydrocortisone (ANUSOL-HC) 2.5 % rectal cream Place 1 application rectally 2 (two) times daily. 06/18/19   Ladell Pier, MD  loperamide (IMODIUM) 2 MG capsule Take 2 capsules (4 mg total) by mouth as needed for diarrhea or loose stools. Maximum of 8 capsules/day 01/03/19   Bhagat, Crista Luria, PA  omeprazole (PRILOSEC) 20 MG capsule Take 1 capsule (20 mg total) by mouth daily. Patient not taking: Reported on 06/22/2019 01/03/19   Leanor Kail, PA  ondansetron (ZOFRAN) 8 MG tablet Take 1 tablet (8 mg total) by mouth every 8 (eight) hours as needed for nausea or vomiting. Patient not taking: Reported on 06/22/2019 01/03/19   Leanor Kail, PA  oxyCODONE-acetaminophen (PERCOCET/ROXICET) 5-325 MG tablet Take 1 tablet by mouth every 4 (four) hours as needed for severe pain. 05/25/19   Owens Shark, NP  polyethylene glycol powder (GLYCOLAX/MIRALAX) 17 GM/SCOOP powder Take 17 g by mouth daily. 06/18/19   Ladell Pier, MD  prochlorperazine (COMPAZINE) 10 MG tablet Take 1 tablet (10 mg total) by mouth every 6 (six) hours as needed for  nausea. Patient not taking: Reported on 06/22/2019 12/31/18   June Leap L, DO    Allergies    Piperacillin-tazobactam in dex  Review of Systems   Review of Systems  Gastrointestinal: Positive for abdominal pain.  All other systems reviewed and are negative.   Physical Exam Updated Vital Signs BP 105/66 (BP Location: Right Arm)    Pulse (!) 107    Temp 98 F (36.7 C) (Oral)    Resp 19    SpO2 98%   Physical Exam Vitals and nursing note reviewed.  Constitutional:      General: He is not in acute distress.    Appearance: He is well-developed. He is cachectic. He is ill-appearing.  HENT:     Head: Normocephalic and atraumatic.  Eyes:     Conjunctiva/sclera: Conjunctivae normal.     Pupils: Pupils are equal, round, and reactive to light.  Cardiovascular:     Rate  and Rhythm: Regular rhythm. Tachycardia present.     Pulses: Normal pulses.     Heart sounds: No murmur.  Pulmonary:     Effort: Pulmonary effort is normal. No respiratory distress.     Breath sounds: Normal breath sounds. No wheezing or rales.  Abdominal:     General: There is no distension.     Palpations: Abdomen is soft.     Tenderness: There is abdominal tenderness in the left lower quadrant. There is no guarding or rebound.     Comments: No significant distention.  Normal bowel movements at this time.  Right side of abdomen is soft.  Firmness noted and tenderness in the left lower quadrant  Musculoskeletal:        General: No tenderness. Normal range of motion.     Cervical back: Normal range of motion and neck supple.     Right lower leg: No edema.     Left lower leg: No edema.  Skin:    General: Skin is warm and dry.     Findings: No erythema or rash.  Neurological:     General: No focal deficit present.     Mental Status: He is alert and oriented to person, place, and time. Mental status is at baseline.  Psychiatric:        Mood and Affect: Mood normal.        Behavior: Behavior normal.         Thought Content: Thought content normal.     ED Results / Procedures / Treatments   Labs (all labs ordered are listed, but only abnormal results are displayed) Labs Reviewed  COMPREHENSIVE METABOLIC PANEL - Abnormal; Notable for the following components:      Result Value   Glucose, Bld 125 (*)    Creatinine, Ser 0.60 (*)    Calcium 8.4 (*)    Albumin 3.0 (*)    AST 500 (*)    ALT 460 (*)    Alkaline Phosphatase 874 (*)    All other components within normal limits  CBC - Abnormal; Notable for the following components:   RBC 3.63 (*)    Hemoglobin 11.1 (*)    HCT 34.6 (*)    Platelets 668 (*)    All other components within normal limits  LIPASE, BLOOD  URINALYSIS, ROUTINE W REFLEX MICROSCOPIC    EKG None  Radiology DG Chest 2 View  Result Date: 06/22/2019 CLINICAL DATA:  cough, shortness of breath, nausea, history of appendiceal carcinoma EXAM: CHEST - 2 VIEW COMPARISON:  12/17/2018, 03/30/2019 FINDINGS: Frontal and lateral views of the chest demonstrates stable right chest wall port. Stable moderate left pleural effusion since prior CT. Left basilar consolidation consistent with compressive atelectasis. Trace right pleural effusion unchanged since prior CT. Stable patchy consolidation along the right hemidiaphragm consistent with atelectasis or scar. No pneumothorax. No acute bony abnormalities. IMPRESSION: 1. Stable bilateral pleural effusions and bibasilar consolidation, left greater than right. Electronically Signed   By: Randa Ngo M.D.   On: 06/22/2019 12:18   DG Abdomen 1 View  Result Date: 06/23/2019 CLINICAL DATA:  Abdominal pain. EXAM: ABDOMEN - 1 VIEW COMPARISON:  CT 06/21/2018.  Abdomen 05/12/2019. FINDINGS: Distended loops of bowel again noted. Distention has improved from prior CT. No free air. Opacification of the left base consistent large known left pleural effusion noted. Small right pleural effusion noted. No acute bony abnormality. IMPRESSION: 1. Distended  loops of bowel again noted. Distention has improved from prior recent CT  of 06/21/2018. Continued follow-up exams suggested to demonstrate complete resolution of small-bowel distention. 2. Opacification of the left base consistent with large stone left pleural effusion. Small right pleural effusion noted. Electronically Signed   By: Marcello Moores  Register   On: 06/23/2019 12:51   CT ABDOMEN PELVIS W CONTRAST  Result Date: 06/22/2019 CLINICAL DATA:  Abdominal distension. History of carcinoma of the appendix. EXAM: CT ABDOMEN AND PELVIS WITH CONTRAST TECHNIQUE: Multidetector CT imaging of the abdomen and pelvis was performed using the standard protocol following bolus administration of intravenous contrast. CONTRAST:  63m OMNIPAQUE IOHEXOL 300 MG/ML  SOLN COMPARISON:  03/30/2019 FINDINGS: Lower chest: A large left pleural effusion which now appears newly loculated compared with 03/30/2019. Pleural nodularity overlying the right lung is noted. Hepatobiliary: No suspicious liver lesion identified. New mild intrahepatic bile duct dilatation. The common bile duct is also increased in caliber compared with the previous exam measuring 9 mm. Previously 5 mm. Pancreas: Unremarkable. No pancreatic ductal dilatation or surrounding inflammatory changes. Spleen: Splenectomy Adrenals/Urinary Tract: Normal appearance of the adrenal glands. Bilateral pelvocaliectasis. No kidney mass identified. Urinary bladder unremarkable. Stomach/Bowel: The stomach appears nondistended. Previous right hemicolectomy with enterocolonic anastomosis. There is increase caliber of the small and large bowel loops up to the anastomosis at the rectosigmoid colon. Soft tissue mass just distal to the anastomotic suture line is identified measuring approximately 5.9 by 3.1 cm, image 79/4 74/4 . Previously 4.0 x 3.9 cm. Vascular/Lymphatic: Mild aortic atherosclerosis. No aneurysm. No retroperitoneal or pelvic adenopathy. Reproductive: Prostate is unremarkable.  Other: Interval decrease in volume of previously noted fluid within the left upper quadrant of the abdomen. Musculoskeletal: IMPRESSION: 1. Increase caliber of the small and large bowel loops up to the anastomosis at the rectosigmoid colon. Findings are concerning for distal bowel obstruction secondary to recurrent tumor. 2. Increase caliber of the common bile duct and intrahepatic bile ducts. 3. Increase in size of large left pleural effusion which appears newly loculated compared with 03/30/2019. Pleural nodularity overlying the right lung is noted. Imaging findings are compatible with pleural spread of disease. 4. Decrease in volume of fluid within the left upper quadrant of the abdomen. Electronically Signed   By: TKerby MoorsM.D.   On: 06/22/2019 15:13    Procedures Procedures (including critical care time)  Medications Ordered in ED Medications  0.9 %  sodium chloride infusion ( Intravenous New Bag/Given 06/23/19 1232)  sodium chloride flush (NS) 0.9 % injection 3 mL (3 mLs Intravenous Given 06/23/19 1233)  HYDROmorphone (DILAUDID) injection 1 mg (1 mg Intravenous Given 06/23/19 1231)  ondansetron (ZOFRAN) injection 4 mg (4 mg Intravenous Given 06/23/19 1232)    ED Course  I have reviewed the triage vital signs and the nursing notes.  Pertinent labs & imaging results that were available during my care of the patient were reviewed by me and considered in my medical decision making (see chart for details).    MDM Rules/Calculators/A&P                     49year old male with known cancer with metastases who was here yesterday with bowel obstruction but needed to leave AMA to pay his rent returning today.  However patient was able to have a large bowel movement overnight and symptoms have improved.  He is now's hungry and has not had any vomiting.  He is having ongoing pain and states took his last pain pill today.  He is unable to get any more pain  medicine until the eighth.  Patient has a  known chronic obstruction but now that he is able to eat and having bowel movements spoke with general surgery and they do not feel there is anything surgical at this time and even prior to that felt he was a poor surgical candidate.  Spoke with Dr. Benay Spice patient's oncologist and labs have all improved today except for new elevated LFTs since yesterday.  He reports that they will recheck these on Thursday or Friday of this week but if the patient is eating and having bowel movements without vomiting he would be clear for discharge home.  He reports that patient does not overuse pain medication most of the time and it would be fine to give an additional prescription of pain medication.  Patient's x-ray today shows improvement in obstruction.  Dr. Benay Spice also reported that patient was supposed to go to [facility] to have repeat scans to see Dr. Clovis Riley for possible surgery but did not show up.  He frequently cancels appointments and it is difficult to get him in the office at times.  However at this time patient does not appear to have an acute surgical process going on and does not meet admission criteria.  Patient was given pain control but at this time appears stable for discharge.  Final Clinical Impression(s) / ED Diagnoses Final diagnoses:  Left lower quadrant abdominal pain    Rx / DC Orders ED Discharge Orders         Ordered    oxyCODONE (ROXICODONE) 5 MG immediate release tablet  Every 4 hours PRN     06/23/19 1422           Blanchie Dessert, MD 06/23/19 1444

## 2019-06-23 NOTE — ED Notes (Signed)
Patient is requesting to eat and is very agitated at this time regarding wanting food.  MD messaged to let her know

## 2019-06-23 NOTE — ED Triage Notes (Signed)
Patient here from home via EMS with complaints of abd pain, nausea, vomiting. Reports being seen yesturday for same and saw surgery. Patient left AMA but states that he "was discharged". Pain meds with no relief. Cancer patient .

## 2019-06-23 NOTE — ED Notes (Signed)
Pt requesting medication for indigestion. MD aware. Awaiting orders.

## 2019-06-23 NOTE — ED Notes (Signed)
After deaccessing pts port. Pt reports he began to feel nauseous. MD aware. Verbal order for ODT Zofran.

## 2019-06-23 NOTE — ED Notes (Signed)
Upon going into pt room to administer pain medication. Pt held up finger to nurse due to pt being on phone. Will attempt to go back and administer medication at a later time.

## 2019-06-23 NOTE — Telephone Encounter (Signed)
Per Dr. Benay Spice: Patient needs repeat CMP on 06/25/19 and needs to see Dr. Clovis Riley at [facility] as soon as possible. Called Donnie to make him aware of MD orders and he agrees. He will call [facility] for an appointment. He agrees when scheduler calls for lab appointment this week, he will call transportation to arrange his ride. Called radiology and requested the CT scan from 06/22/19 be pushed over to [facility] for viewing.

## 2019-06-24 ENCOUNTER — Telehealth: Payer: Self-pay | Admitting: Oncology

## 2019-06-24 NOTE — Telephone Encounter (Signed)
Scheduled appt per 2/2 sch message - pt aware of appt date and time

## 2019-06-25 ENCOUNTER — Other Ambulatory Visit: Payer: Self-pay

## 2019-06-25 ENCOUNTER — Inpatient Hospital Stay: Payer: Medicaid Other

## 2019-06-25 ENCOUNTER — Inpatient Hospital Stay: Payer: Medicaid Other | Attending: Oncology

## 2019-06-25 DIAGNOSIS — C799 Secondary malignant neoplasm of unspecified site: Secondary | ICD-10-CM

## 2019-06-25 DIAGNOSIS — C181 Malignant neoplasm of appendix: Secondary | ICD-10-CM

## 2019-06-25 DIAGNOSIS — Z95828 Presence of other vascular implants and grafts: Secondary | ICD-10-CM

## 2019-06-25 LAB — CMP (CANCER CENTER ONLY)
ALT: 208 U/L — ABNORMAL HIGH (ref 0–44)
AST: 79 U/L — ABNORMAL HIGH (ref 15–41)
Albumin: 2.7 g/dL — ABNORMAL LOW (ref 3.5–5.0)
Alkaline Phosphatase: 689 U/L — ABNORMAL HIGH (ref 38–126)
Anion gap: 9 (ref 5–15)
BUN: 14 mg/dL (ref 6–20)
CO2: 28 mmol/L (ref 22–32)
Calcium: 8.6 mg/dL — ABNORMAL LOW (ref 8.9–10.3)
Chloride: 101 mmol/L (ref 98–111)
Creatinine: 0.55 mg/dL — ABNORMAL LOW (ref 0.61–1.24)
GFR, Est AFR Am: >60 mL/min (ref >60)
GFR, Estimated: >60 mL/min (ref >60)
Glucose, Bld: 101 mg/dL — ABNORMAL HIGH (ref 70–99)
Potassium: 3.7 mmol/L (ref 3.5–5.1)
Sodium: 138 mmol/L (ref 135–145)
Total Bilirubin: 0.5 mg/dL (ref 0.3–1.2)
Total Protein: 7 g/dL (ref 6.5–8.1)

## 2019-06-25 MED ORDER — SODIUM CHLORIDE 0.9% FLUSH
10.0000 mL | Freq: Once | INTRAVENOUS | Status: AC
  Administered 2019-06-25: 10 mL
  Filled 2019-06-25: qty 10

## 2019-06-25 MED ORDER — HEPARIN SOD (PORK) LOCK FLUSH 100 UNIT/ML IV SOLN
500.0000 [IU] | Freq: Once | INTRAVENOUS | Status: AC
  Administered 2019-06-25: 12:00:00 500 [IU]
  Filled 2019-06-25: qty 5

## 2019-06-25 NOTE — Patient Instructions (Signed)

## 2019-06-26 ENCOUNTER — Telehealth: Payer: Self-pay | Admitting: *Deleted

## 2019-06-26 ENCOUNTER — Other Ambulatory Visit: Payer: Self-pay | Admitting: Nurse Practitioner

## 2019-06-26 DIAGNOSIS — C181 Malignant neoplasm of appendix: Secondary | ICD-10-CM

## 2019-06-26 NOTE — Telephone Encounter (Signed)
Per Ned Card, NP, called and made pt aware that his LFTs were better and Dr. Benay Spice would like for him to come in for a f/u and labs in 2 weeks. Pt verbalized anger and hung up the phone.

## 2019-06-26 NOTE — Telephone Encounter (Signed)
-----   Message from Owens Shark, NP sent at 06/26/2019  8:32 AM EST ----- Please let him know LFTs are better.  Dr. Benay Spice would like for him to return for repeat labs and a follow-up appointment in 2 weeks.  I have entered the lab orders and sent the LOS to have this scheduled.  Thanks

## 2019-06-29 ENCOUNTER — Telehealth: Payer: Self-pay | Admitting: *Deleted

## 2019-06-29 ENCOUNTER — Other Ambulatory Visit: Payer: Self-pay | Admitting: Nurse Practitioner

## 2019-06-29 DIAGNOSIS — C181 Malignant neoplasm of appendix: Secondary | ICD-10-CM

## 2019-06-29 MED ORDER — OXYCODONE-ACETAMINOPHEN 5-325 MG PO TABS: 1.0000 | ORAL_TABLET | ORAL | 0 refills | Status: AC | PRN

## 2019-06-29 MED ORDER — ALPRAZOLAM 0.5 MG PO TABS: 0.5000 mg | ORAL_TABLET | Freq: Two times a day (BID) | ORAL | 0 refills | Status: AC | PRN

## 2019-06-29 NOTE — Telephone Encounter (Signed)
Needs refills on his Percocet and Xanax sent to [facility] on [address]. Has not scheduled appointment with Dr. Clovis Riley yet due to not having transportation. The lady that has helped in the past has recently died. [employer] is not transporting people yet due to [place]. Will inquire with social worker to see if he has any options. [facility] and confirmed that they do not provide any transportation assistance.

## 2019-06-30 ENCOUNTER — Encounter: Payer: Self-pay | Admitting: *Deleted

## 2019-06-30 ENCOUNTER — Encounter (HOSPITAL_COMMUNITY): Payer: Self-pay

## 2019-06-30 ENCOUNTER — Other Ambulatory Visit: Payer: Self-pay

## 2019-06-30 ENCOUNTER — Inpatient Hospital Stay (HOSPITAL_COMMUNITY)
Admission: EM | Admit: 2019-06-30 | Discharge: 2019-07-03 | DRG: 374 | Disposition: A | Discharge status: Other Acute Inpatient Hospital | Payer: Medicaid Other | Attending: Family Medicine | Admitting: Family Medicine

## 2019-06-30 ENCOUNTER — Emergency Department (HOSPITAL_COMMUNITY): Payer: Medicaid Other

## 2019-06-30 DIAGNOSIS — C786 Secondary malignant neoplasm of retroperitoneum and peritoneum: Secondary | ICD-10-CM | POA: Diagnosis present

## 2019-06-30 DIAGNOSIS — F1021 Alcohol dependence, in remission: Secondary | ICD-10-CM | POA: Diagnosis present

## 2019-06-30 DIAGNOSIS — D751 Secondary polycythemia: Secondary | ICD-10-CM | POA: Diagnosis present

## 2019-06-30 DIAGNOSIS — Z515 Encounter for palliative care: Secondary | ICD-10-CM

## 2019-06-30 DIAGNOSIS — R64 Cachexia: Secondary | ICD-10-CM | POA: Diagnosis present

## 2019-06-30 DIAGNOSIS — D72829 Elevated white blood cell count, unspecified: Secondary | ICD-10-CM | POA: Diagnosis not present

## 2019-06-30 DIAGNOSIS — Z85038 Personal history of other malignant neoplasm of large intestine: Secondary | ICD-10-CM

## 2019-06-30 DIAGNOSIS — E43 Unspecified severe protein-calorie malnutrition: Secondary | ICD-10-CM | POA: Diagnosis present

## 2019-06-30 DIAGNOSIS — K56609 Unspecified intestinal obstruction, unspecified as to partial versus complete obstruction: Secondary | ICD-10-CM

## 2019-06-30 DIAGNOSIS — R45851 Suicidal ideations: Secondary | ICD-10-CM | POA: Diagnosis not present

## 2019-06-30 DIAGNOSIS — F17210 Nicotine dependence, cigarettes, uncomplicated: Secondary | ICD-10-CM | POA: Diagnosis present

## 2019-06-30 DIAGNOSIS — Z7982 Long term (current) use of aspirin: Secondary | ICD-10-CM

## 2019-06-30 DIAGNOSIS — Z79899 Other long term (current) drug therapy: Secondary | ICD-10-CM

## 2019-06-30 DIAGNOSIS — R109 Unspecified abdominal pain: Secondary | ICD-10-CM | POA: Diagnosis not present

## 2019-06-30 DIAGNOSIS — Z681 Body mass index (BMI) 19 or less, adult: Secondary | ICD-10-CM

## 2019-06-30 DIAGNOSIS — R0600 Dyspnea, unspecified: Secondary | ICD-10-CM | POA: Diagnosis not present

## 2019-06-30 DIAGNOSIS — R188 Other ascites: Secondary | ICD-10-CM | POA: Diagnosis present

## 2019-06-30 DIAGNOSIS — C787 Secondary malignant neoplasm of liver and intrahepatic bile duct: Secondary | ICD-10-CM | POA: Diagnosis present

## 2019-06-30 DIAGNOSIS — J90 Pleural effusion, not elsewhere classified: Secondary | ICD-10-CM | POA: Diagnosis present

## 2019-06-30 DIAGNOSIS — Z08 Encounter for follow-up examination after completed treatment for malignant neoplasm: Secondary | ICD-10-CM

## 2019-06-30 DIAGNOSIS — Z7189 Other specified counseling: Secondary | ICD-10-CM | POA: Diagnosis not present

## 2019-06-30 DIAGNOSIS — Z881 Allergy status to other antibiotic agents status: Secondary | ICD-10-CM

## 2019-06-30 DIAGNOSIS — Z9049 Acquired absence of other specified parts of digestive tract: Secondary | ICD-10-CM | POA: Diagnosis not present

## 2019-06-30 DIAGNOSIS — C7A020 Malignant carcinoid tumor of the appendix: Secondary | ICD-10-CM | POA: Diagnosis not present

## 2019-06-30 DIAGNOSIS — K219 Gastro-esophageal reflux disease without esophagitis: Secondary | ICD-10-CM | POA: Diagnosis present

## 2019-06-30 DIAGNOSIS — Z9081 Acquired absence of spleen: Secondary | ICD-10-CM | POA: Diagnosis not present

## 2019-06-30 DIAGNOSIS — J9811 Atelectasis: Secondary | ICD-10-CM | POA: Diagnosis present

## 2019-06-30 DIAGNOSIS — Z9221 Personal history of antineoplastic chemotherapy: Secondary | ICD-10-CM

## 2019-06-30 DIAGNOSIS — Z20822 Contact with and (suspected) exposure to covid-19: Secondary | ICD-10-CM | POA: Diagnosis present

## 2019-06-30 DIAGNOSIS — T451X5A Adverse effect of antineoplastic and immunosuppressive drugs, initial encounter: Secondary | ICD-10-CM | POA: Diagnosis present

## 2019-06-30 DIAGNOSIS — I252 Old myocardial infarction: Secondary | ICD-10-CM | POA: Diagnosis not present

## 2019-06-30 DIAGNOSIS — I5020 Unspecified systolic (congestive) heart failure: Secondary | ICD-10-CM | POA: Diagnosis present

## 2019-06-30 DIAGNOSIS — C181 Malignant neoplasm of appendix: Secondary | ICD-10-CM | POA: Diagnosis present

## 2019-06-30 DIAGNOSIS — R140 Abdominal distension (gaseous): Secondary | ICD-10-CM | POA: Diagnosis not present

## 2019-06-30 DIAGNOSIS — F411 Generalized anxiety disorder: Secondary | ICD-10-CM | POA: Diagnosis present

## 2019-06-30 DIAGNOSIS — C7802 Secondary malignant neoplasm of left lung: Secondary | ICD-10-CM | POA: Diagnosis present

## 2019-06-30 LAB — CBC
HCT: 42.9 % (ref 39.0–52.0)
HCT: 39.6 % (ref 39.0–52.0)
Hemoglobin: 13.7 g/dL (ref 13.0–17.0)
Hemoglobin: 12.7 g/dL — ABNORMAL LOW (ref 13.0–17.0)
MCH: 30.4 pg (ref 26.0–34.0)
MCH: 30.1 pg (ref 26.0–34.0)
MCHC: 31.9 g/dL (ref 30.0–36.0)
MCHC: 32.1 g/dL (ref 30.0–36.0)
MCV: 95.1 fL (ref 80.0–100.0)
MCV: 93.8 fL (ref 80.0–100.0)
Platelets: 790 10*3/uL — ABNORMAL HIGH (ref 150–400)
Platelets: 811 10*3/uL — ABNORMAL HIGH (ref 150–400)
RBC: 4.51 MIL/uL (ref 4.22–5.81)
RBC: 4.22 MIL/uL (ref 4.22–5.81)
RDW: 15.7 % — ABNORMAL HIGH (ref 11.5–15.5)
RDW: 15.7 % — ABNORMAL HIGH (ref 11.5–15.5)
WBC: 11.6 10*3/uL — ABNORMAL HIGH (ref 4.0–10.5)
WBC: 10.7 10*3/uL — ABNORMAL HIGH (ref 4.0–10.5)
nRBC: 0 % (ref 0.0–0.2)
nRBC: 0 % (ref 0.0–0.2)

## 2019-06-30 LAB — COMPREHENSIVE METABOLIC PANEL
ALT: 64 U/L — ABNORMAL HIGH (ref 0–44)
AST: 25 U/L (ref 15–41)
Albumin: 2.6 g/dL — ABNORMAL LOW (ref 3.5–5.0)
Alkaline Phosphatase: 507 U/L — ABNORMAL HIGH (ref 38–126)
Anion gap: 11 (ref 5–15)
BUN: 11 mg/dL (ref 6–20)
CO2: 26 mmol/L (ref 22–32)
Calcium: 9 mg/dL (ref 8.9–10.3)
Chloride: 102 mmol/L (ref 98–111)
Creatinine, Ser: 0.71 mg/dL (ref 0.61–1.24)
GFR calc Af Amer: >60 mL/min (ref >60)
GFR calc non Af Amer: >60 mL/min (ref >60)
Glucose, Bld: 75 mg/dL (ref 70–99)
Potassium: 4.4 mmol/L (ref 3.5–5.1)
Sodium: 139 mmol/L (ref 135–145)
Total Bilirubin: 0.5 mg/dL (ref 0.3–1.2)
Total Protein: 7 g/dL (ref 6.5–8.1)

## 2019-06-30 LAB — CREATININE, SERUM
Creatinine, Ser: 0.77 mg/dL (ref 0.61–1.24)
GFR calc Af Amer: >60 mL/min (ref >60)
GFR calc non Af Amer: >60 mL/min (ref >60)

## 2019-06-30 LAB — RESPIRATORY PANEL BY RT PCR (FLU A&B, COVID)
Influenza A by PCR: NEGATIVE
Influenza B by PCR: NEGATIVE
SARS Coronavirus 2 by RT PCR: NEGATIVE

## 2019-06-30 LAB — PREALBUMIN: Prealbumin: 8.7 mg/dL — ABNORMAL LOW (ref 18–38)

## 2019-06-30 LAB — HIV ANTIBODY (ROUTINE TESTING W REFLEX): HIV Screen 4th Generation wRfx: NONREACTIVE

## 2019-06-30 MED ORDER — ENOXAPARIN SODIUM 40 MG/0.4ML ~~LOC~~ SOLN
40.0000 mg | SUBCUTANEOUS | Status: DC
  Administered 2019-06-30: 21:00:00 40 mg via SUBCUTANEOUS
  Filled 2019-06-30: qty 0.4

## 2019-06-30 MED ORDER — CHLORHEXIDINE GLUCONATE CLOTH 2 % EX PADS
6.0000 | MEDICATED_PAD | Freq: Every day | CUTANEOUS | Status: DC
  Administered 2019-07-01 – 2019-07-02 (×2): 6 via TOPICAL

## 2019-06-30 MED ORDER — MORPHINE SULFATE (PF) 4 MG/ML IV SOLN
3.0000 mg | INTRAVENOUS | Status: DC | PRN
  Administered 2019-06-30 – 2019-07-02 (×8): 3 mg via INTRAVENOUS
  Filled 2019-06-30 (×8): qty 1

## 2019-06-30 MED ORDER — SODIUM CHLORIDE 0.9 % IV SOLN: INTRAVENOUS | Status: DC

## 2019-06-30 MED ORDER — HYDROMORPHONE HCL 1 MG/ML IJ SOLN
1.0000 mg | INTRAMUSCULAR | Status: DC | PRN
  Administered 2019-06-30 – 2019-07-02 (×8): 1 mg via INTRAVENOUS
  Filled 2019-06-30 (×8): qty 1

## 2019-06-30 MED ORDER — ONDANSETRON HCL 4 MG/2ML IJ SOLN
4.0000 mg | INTRAMUSCULAR | Status: DC | PRN
  Administered 2019-07-01: 4 mg via INTRAVENOUS
  Filled 2019-06-30: qty 2

## 2019-06-30 MED ORDER — SODIUM CHLORIDE 0.9 % IV BOLUS
500.0000 mL | Freq: Once | INTRAVENOUS | Status: AC
  Administered 2019-06-30: 13:00:00 500 mL via INTRAVENOUS

## 2019-06-30 MED ORDER — MORPHINE SULFATE (PF) 2 MG/ML IV SOLN: 2.0000 mg | INTRAVENOUS | Status: DC | PRN

## 2019-06-30 MED ORDER — IOHEXOL 300 MG/ML  SOLN
90.0000 mL | Freq: Once | INTRAMUSCULAR | Status: AC | PRN
  Administered 2019-06-30: 90 mL via INTRAVENOUS

## 2019-06-30 MED ORDER — SODIUM CHLORIDE 0.9% FLUSH: 10.0000 mL | INTRAVENOUS | Status: DC | PRN

## 2019-06-30 MED ORDER — MORPHINE SULFATE (PF) 2 MG/ML IV SOLN
1.0000 mg | INTRAVENOUS | Status: DC | PRN
  Administered 2019-06-30: 4 mg via INTRAVENOUS
  Filled 2019-06-30: qty 2

## 2019-06-30 MED ORDER — MORPHINE SULFATE (PF) 4 MG/ML IV SOLN
4.0000 mg | Freq: Once | INTRAVENOUS | Status: AC
  Administered 2019-06-30: 4 mg via INTRAVENOUS
  Filled 2019-06-30: qty 1

## 2019-06-30 MED ORDER — FENTANYL 50 MCG/HR TD PT72: 1.0000 | MEDICATED_PATCH | TRANSDERMAL | Status: DC

## 2019-06-30 NOTE — H&P (Addendum)
[facility] Admission History and Physical Service Pager: [phone]  Patient name: Derek Lloyd Medical record number: [id number] Date of birth: 17-Nov-1970 Age: 49 y.o. Gender: male  Primary Care Provider: Ladell Pier, MD Consultants: Surgery Code Status: Full Code Preferred Emergency Contact: [name], friend, [phone]  Chief Complaint: nausea and abdominal pain  Assessment and Plan: Derek Lloyd is a 49 y.o. male presenting with abdominal pain . PMH is significant for Appendiceal Carcinoma, STEMI secondary to chemo with EF 20-25%,   Abdominal Pain secondary Large Bowel Obstruction due to Stage 4 appendiceal carcinoma  Pt reports not having a normal bowel movement in three weeks. Reports nausea, vomiting and increasing abdominal pain since surgery three months ago.  CT abdomen and pelvis impressive for distal large bowel obstruction.  Thickening and irregularity involving the rectum distal to the surgical anastomosis.  Findings consistent with neoplastic disease.  Surgery was consulted in the ED and recommended surgical intervention. Patient appears cachectic. Abdomen hard and diffusely tender.  Bowel sounds hyperactive.  -Surgery consulted in ED, recommends surgical intervention in am -NPO -Dilaudid 1 mg q2h prn -Morphine 3 mg q4h prn -Zofran 63m IV q4h prn -Palliative consulted -SCD's -Nutrition consult -PT/OT consult  SOB without hypoxia likely secondary to Large left pleural effusion Pt reports worsening SOB that started this morning.  Denies any fevers, chills or cough. Previous heavy smoker and reports continued smoking but less as he has a lot of stress. He was in the hospital in 2/1 and was found to have a pleural effusion and it was recommended he have it drained however he left AMA as he stated he "had to pay his rent so he wouldn't end up out on the street".  CTA completed and negative for pulmonary embolism so PE seems less  likely.  CHF included in differentials given that last EF 20-25% s/p STEMI secondary to chemotherapy. Given that patient appears euvolemic on exam less likely due to exacerbation CHF. Chest abdomen/pelvis shows a marked increase in the left pleural effusion with almost complete compressive atelectasis of the left upper lobe. On exam lung sounds diminished on LLL, no crackles or wheezing appreciated.  Most likely the pleural effusion is from metastatic disease from his appendiceal cancer that is now worse in his abdomen. However, he is immunocompromised due to receiving chemotherapy treatment with Oncology so cannot rule out an infectious cause. -IR consulted for possible thoracentesis in am  -NPO>MN -Monitor respiratory status -Maintain oxygen saturations>92%  [facility]  Oncologist Dr. SAmmie Dalton  Patient has Right upper Port-a-Cath for chemo which he received his last dose in October.   -Palliative consult  Polycythemia Platelets 790>811.  Likely secondary to metastatic disease. -Continue to monitor  FEN/GI: NPO, IVF Prophylaxis: SCD's  Disposition: Med-Surg, Attending Dr NNori Riis History of Present Illness:  DKsean Valeis a 49y.o. male presenting with nausea and abdominal pain.  Pt reports not having bowel movement in over three weeks.  He was seen Feb first for bowel obstruction but left AMA as he had to go home to "pay his rent"  He reports having issues since his surgery in August 2019 for Appendix Carcinoma.  He now complains of nausea, vomiting and has not been able to keep down food in a while. He reports that the pain started to increase today and he couldn't take it so he came to the hospital.  Denies any chest pain but does endorse some SOB especially with the  mask on.    In the ED he was afebrile, tachycardic and tachypneic with oxygen saturations 97% on room air.  He was given Morphine 21m IV for pain.  COVID test negative, Labs showed mild  elevated WBC, and elevation of Platelets 790.  CTA completed and was negative for PE but impressive for marked increase in left pleural effusion and partial compressive atelectasis of the left upper lobe, with mass effect shifting the heart and mediastinal structures to the right as well as tiny right effusion, hepatomegaly, and ascites.  CT abdomen and pelvis impressive for distal large bowel obstruction.  Thickening and irregularity involving the rectum distal to the surgical anastomosis.  Findings consistent with neoplastic disease.  Surgery was consulted in the ED and recommended surgical intervention.   Review Of Systems: Per HPI with the following additions:   Review of Systems  Constitutional: Positive for weight loss. Negative for chills and fever.  Respiratory: Negative for cough and hemoptysis.   Cardiovascular: Negative for chest pain, palpitations and leg swelling.  Gastrointestinal: Positive for abdominal pain, constipation, nausea and vomiting. Negative for diarrhea.  Genitourinary: Positive for dysuria.    Patient Active Problem List   Diagnosis Date Noted  . SVT (supraventricular tachycardia) ([facility] 01/02/2019  . Unstable angina ([facility] [place]   . Atrial tachycardia ([facility]   . Appendix carcinoma ([facility] 12/19/2018  . Port-A-Cath in place 12/02/2017  . Primary appendiceal adenocarcinoma ([facility] 11/27/2017  . Metastatic carcinoma ([facility] 10/24/2017  . Goals of care, counseling/discussion 10/24/2017  . Abdominal pain 09/24/2017  . Ascites 09/24/2017  . SBO (small bowel obstruction) ([facility] 08/13/2017  . Right lower quadrant abdominal abscess ([facility] 01/26/2017    Past Medical History: Past Medical History:  Diagnosis Date  . Abdominal pain 09/2017  . Anxiety attack   . apendix ca dx'd 09/2018   with intra abd mets  . Headache    "frequently" (08/14/2017)  . Heart murmur    "when I was a kid" (08/14/2017)  . Small bowel obstruction ([facility] 08/13/2017    Past Surgical History: Past  Surgical History:  Procedure Laterality Date  . BIOPSY  10/02/2017   Procedure: BIOPSY;  Surgeon: KRonnette Juniper MD;  [facility];  Service: Gastroenterology;;  . COLONOSCOPY WITH PROPOFOL N/A 10/02/2017   Procedure: COLONOSCOPY WITH PROPOFOL;  Surgeon: KRonnette Juniper MD;  [place] [facility]  Service: Gastroenterology;  Laterality: N/A;  . FINGER SURGERY Left    "thumb"  . IR IMAGING GUIDED PORT INSERTION  11/01/2017  . IR PARACENTESIS  09/24/2017  . IR [place] ACC[place] RIGHT  11/01/2017  . LEFT HEART CATH AND CORONARY ANGIOGRAPHY N/A 01/02/2019   Procedure: LEFT HEART CATH AND CORONARY ANGIOGRAPHY;  Surgeon: AWellington Hampshire MD;  [facility];  Service: Cardiovascular;  Laterality: N/A;  . POLYPECTOMY N/A 10/02/2017   Procedure: POLYPECTOMY;  Surgeon: KRonnette Juniper MD;  [place] [facility]  Service: Gastroenterology;  Laterality: N/A;  . SUBMUCOSAL INJECTION  10/02/2017   Procedure: SUBMUCOSAL INJECTION;  Surgeon: KRonnette Juniper MD;  [facility];  Service: Gastroenterology;;  . TYMPANOSTOMY TUBE PLACEMENT Bilateral     Social History: Social History   Tobacco Use  . Smoking status: Current Some Day Smoker    Packs/day: 1.00    Types: Cigarettes    Last attempt to quit: 09/28/2016    Years since quitting: 2.7  . Smokeless tobacco: Never Used  . Tobacco comment: 08/14/2017 "I quit; can't tell you when I quit or when I  started"  Substance Use Topics  . Alcohol use: Yes    Comment: 08/14/2017 "I drink when I feel like it; nothing recently"  . Drug use: Not Currently    Types: Marijuana   Additional social history: says he drink sometimes he is a recovering alcoholic Please also refer to relevant sections of EMR.  Family History: Family History  Problem Relation Age of Onset  . Prostate cancer Paternal Grandfather   . Crohn's disease Neg Hx     Allergies and Medications: Allergies  Allergen Reactions  . Piperacillin-Tazobactam In Dex  Rash    Noted on May 2019 admission.  Tolerates Cephalosporins.   No current facility-administered medications on file prior to encounter.   Current Outpatient Medications on File Prior to Encounter  Medication Sig Dispense Refill  . ALPRAZolam (XANAX) 0.5 MG tablet Take 1 tablet (0.5 mg total) by mouth 2 (two) times daily as needed for anxiety. 60 tablet 0  . hydrocortisone (ANUSOL-HC) 2.5 % rectal cream Place 1 application rectally 2 (two) times daily. 30 g 0  . oxyCODONE (ROXICODONE) 5 MG immediate release tablet Take 1 tablet (5 mg total) by mouth every 4 (four) hours as needed for severe pain. 15 tablet 0  . polyethylene glycol powder (GLYCOLAX/MIRALAX) 17 GM/SCOOP powder Take 17 g by mouth daily. 507 g 1  . carvedilol (COREG) 3.125 MG tablet Take 1 tablet (3.125 mg total) by mouth 2 (two) times daily with a meal. (Patient not taking: Reported on 06/22/2019) 60 tablet 6  . HYDROcodone-acetaminophen (NORCO/VICODIN) 5-325 MG tablet Take 1-2 tablets by mouth every 6 (six) hours as needed for moderate pain. (Patient not taking: Reported on 06/22/2019) 60 tablet 0  . oxyCODONE-acetaminophen (PERCOCET/ROXICET) 5-325 MG tablet Take 1 tablet by mouth every 4 (four) hours as needed for severe pain. 60 tablet 0    Objective: BP 101/71   Pulse (!) 104   Temp 97.7 F (36.5 C) (Oral)   Resp (!) 28   Ht 5' 10"  (1.778 m)   Wt 43.1 kg   SpO2 97%   BMI 13.63 kg/m  Exam: General: 49 y.o male,appears cachectic and older that stated age Cardiovascular: Tachycardic, no murmurs or gallops appreciated Respiratory: Tachypneic with IWOB, no crackles or wheezes noted, diminished lung sound LLL Gastrointestinal: Hard and firm, painful to touch on light palpation, BS hyperactive Neuro: Alert and orientated x3  Labs and Imaging: CBC BMET  Recent Labs  Lab 06/30/19 1157  WBC 11.6*  HGB 13.7  HCT 42.9  PLT 790*   Recent Labs  Lab 06/30/19 1157  NA 139  K 4.4  CL 102  CO2 26  BUN 11  CREATININE  0.71  GLUCOSE 75  CALCIUM 9.0     EKG: Vent. rate 112 BPM PR interval * ms QRS duration 76 ms QT/QTc 297/406 ms P-R-T axes 80 34 104 Sinus tachycardia RSR' in V1 or V2, right VCD or RVH Nonspecific T abnormalities, lateral leads Confirmed by Pattricia Boss [phone]) on 06/30/2019 1:09:25 PM 35m/s 154mmV 150Hz  9.0.4 CID: [id number] By: DaPattricia Boss  CT Angio Chest PE W and/or Wo Contrast  Result Date: 06/30/2019 CLINICAL DATA:  Shortness of breath. EXAM: CT ANGIOGRAPHY CHEST WITH CONTRAST TECHNIQUE: Multidetector CT imaging of the chest was performed using the standard protocol during bolus administration of intravenous contrast. Multiplanar CT image reconstructions and MIPs were obtained to evaluate the vascular anatomy. CONTRAST:  9071mMNIPAQUE IOHEXOL 300 MG/ML  SOLN COMPARISON:  03/30/2019 FINDINGS: Cardiovascular: There are  no pulmonary emboli. Heart size is normal. The heart and other mediastinal structures are slightly shifted to the right by the large left pleural effusion. Mediastinum/Nodes: No enlarged mediastinal, hilar, or axillary lymph nodes. Thyroid gland, trachea, and esophagus demonstrate no significant findings. Lungs/Pleura: There has been a marked increase in the left pleural effusion with almost complete compressive atelectasis of left lower lobe and partial compressive atelectasis of the left upper lobe. Tiny right effusion. Small bleb in the right lower lobe, unchanged. Chronic irregular thickening of the fissures with slight pleural nodularity as described on the prior exam. This is unchanged. Upper Abdomen: Hepatomegaly. Ascites. Musculoskeletal: No chest wall abnormality. No acute or significant osseous findings. Review of the MIP images confirms the above findings. IMPRESSION: 1. No pulmonary emboli. 2. Marked increase in the large left pleural effusion with almost complete compressive atelectasis of the left lower lobe and partial compressive atelectasis of the  left upper lobe. This effusion has a mass effect shifting the heart and other mediastinal structures to the right. 3. Tiny right effusion. 4. Hepatomegaly. 5. Ascites. Electronically Signed   By: Lorriane Shire M.D.   On: 06/30/2019 14:57   CT ABDOMEN PELVIS W CONTRAST  Result Date: 06/30/2019 CLINICAL DATA:  49 year old with acute abdominal pain and neutropenia. History of appendiceal cancer. EXAM: CT ABDOMEN AND PELVIS WITH CONTRAST TECHNIQUE: Multidetector CT imaging of the abdomen and pelvis was performed using the standard protocol following bolus administration of intravenous contrast. CONTRAST:  84m OMNIPAQUE IOHEXOL 300 MG/ML  SOLN COMPARISON:  06/22/1999 FINDINGS: Lower chest: Again noted is large pleural effusion at the left lung base with pleural enhancement. There appears to be extensive volume loss in the left lower lobe. Findings are suggestive for a complex or loculated left pleural effusion. Again noted is irregular pleural thickening along the right major fissure and mild pleural thickening at right lung base. Hepatobiliary: Common bile duct measures roughly 7 mm and this is similar to the previous examination. Stable mild intrahepatic biliary dilatation. Main portal venous system patent. Pancreas: Unremarkable. No pancreatic ductal dilatation or surrounding inflammatory changes. Spleen: Absent Adrenals/Urinary Tract: Limited evaluation of the adrenal glands. Fullness in the renal collecting systems bilaterally and similar to the prior examination. Mild to moderate amount of fluid in the urinary bladder. Stomach/Bowel: Distal rectum is distended with gas and stool. The proximal rectum just beyond the surgical anastomosis again demonstrates diffuse wall thickening and concerning for a soft tissue lesion or neoplastic process. Area of wall thickening measures at least 4.9 cm in length and similar to the recent examination. There is a surgical anastomosis just proximal to this area of wall  thickening and the colon proximal to the lesion is markedly distended and contains a large amount of stool. Findings are suggestive for a mechanical obstruction at this level. Large amount of stool throughout the left colon. There is a surgical bowel anastomosis the upper mid abdomen and findings are compatible with right hemicolectomy. Again noted are dilated loops of small bowel throughout the abdomen. No gross abnormality to the stomach. Vascular/Lymphatic: Mild atherosclerotic disease in abdominal aorta and iliac arteries without aneurysm or dissection. The main visceral arteries are patent. This is a predominantly arterial phase examination and limited evaluation of the venous structures. Portal venous system is patent. No significant abdominopelvic lymphadenopathy. Reproductive: Limited evaluation of the prostate. Other: Minimal intra-abdominal and subcutaneous fat in the abdomen or pelvis. No evidence for free intraperitoneal air. A small amount of ascites, particularly in the anterior lower  abdomen on sequence 2, image 54. Ascites appears to be similar to the recent comparison examination. Musculoskeletal: No acute bone abnormality. IMPRESSION: 1. Distal large bowel obstruction. Again noted is soft tissue thickening and irregularity involving the rectum just distal to the surgical anastomosis. Findings are concerning for neoplastic disease. Again noted is marked distension of the colon proximal to the lesion. The distended colon contains a large amount of stool. 2. Small amount of ascites, similar to the previous examination. 3. Large loculated left pleural effusion with marked volume loss in left lower lobe. There is also evidence for pleural thickening in the right lung concerning for metastatic disease. Chest findings are similar to the recent comparison examination. 4. Stable fullness of the renal collecting systems. 5. Mild intrahepatic and extrahepatic biliary dilatation is stable. Electronically  Signed   By: Markus Daft M.D.   On: 06/30/2019 15:36    Carollee Leitz, MD 06/30/2019, 4:20 PM PGY-1, [place] Intern pager: [phone], text pages welcome  Resident Attestation   I saw and evaluated the patient, performing the key elements of the service. I personally performed or re-performed the history, physical exam, and medical decision making activities of this service and have verified that the service and findings are accurately documented in the resident's note. I developed the management plan that is described in the resident's note, and I agree with the content, with my edits above in red.   Harolyn Rutherford, DO [facility], PGY-3

## 2019-06-30 NOTE — ED Notes (Signed)
Got patient undress into a gown on the monitor did ekg shown to Dr Jeanell Sparrow patient is resting with call bell in reach

## 2019-06-30 NOTE — ED Notes (Signed)
Pt requested his friend [name] [phone]) to be called and be made aware of what is going on with him, no answer.

## 2019-06-30 NOTE — Progress Notes (Signed)
Patient ID: Derek Lloyd, male   DOB: [birth year], 49 y.o.   MRN: [id number] [facility] op note from cytoreductive surgery/hipec  PREOPERATIVE DIAGNOSIS: Peritoneal carcinomatosis.  POSTOPERATIVE DIAGNOSIS: Peritoneal carcinomatosis.  OPERATIVE PROCEDURES: Exploratory laparotomy, extensive cytoreductive surgery including resection of right colon, sigmoid colon, spleen, omentum, right diaphragm peritonectomy,k placement of four chemoperfusion cannula, and hyperthermic intraperitoneal chemotherapy with mitomycin C following an R1 type resection.  SURGEON: Merlyn Albert. Clovis Riley, MD  ASSISTANT: Chaney Malling, MD  ANESTHESIA: General.  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 This appeared to be of high-grade histology. Endoscopic evaluation found a carcinoma at the appendiceal orifice and a large villous tumor, which was tattooed in the sigmoid colon was treated with preoperative chemotherapy, which resulted in resolution of the ascites, he presented with completely in light of the good response to chemotherapy, I would like to bring him to the operative suite for cytoreductive surgery and hyperthermic intraperitoneal chemotherapy.  DESCRIPTION OF PROCEDURE: She was brought to the operative suite, anesthesia was secured. He was given antibiotics by vein and heparin subcutaneously. Nasogastric tube was placed. The Urology Service initiated the procedure by performing a cystoscopic examination and placing bilateral ureteral stents prior to a urinary catheter. The patient was turned to supine position. His abdomen was prepped and draped in usual fashion and a time-out was performed.  Through a generous midline incision, the abdomen entered in controlled fashion. Adhesions were then taken down. The round ligament of the liver was resected, flushed with the umbilical fissure with the assistance of an EnSeal  device. This allowed placement of the Bookwalter retractor and a thorough exploration.  An exploration revealed what appeared to be old scar extensively throughout the abdomen with residual gross disease in the omentum, right colon, I tattooed lesion in the sigmoid with a mass on the mesenteric side of the bowel, relative sparing of the left diaphragm and central diaphragm, liver and gallbladder. There were some loculated ascites in the pelvis which was found after mobilizing the sigmoid colon.  Resection was begun by mobilizing the cecum out of the pelvis with some adhesions to the parietal peritoneum to the bladder. The posterior parietal peritoneum was reflected en bloc with the right colon as well as the entirety of the right gutter. The small bowel was then run from the ligament of Treitz to the ileocecal valve. Numerous small nodules were resected from the mesentery and fulgurated as debrided tumor. The small bowel was divided approximately 1 foot proximal to the ileocecal valve with a GIA stapler. The right colon was divided just to the right of the right branch of the middle colic vessel. The mesocolon was then taken down below with the EnSeal device. The ileocolic and right colic pedicles were identified and the right and right gutter was entirely stripped off the anterior lamina of Gerota's fascia and the ureter was identified, preserved throughout the course of the operative field with assistance of the ureteral stent.  Attention was then turned to the transverse colon and the omentum was dissected off the transverse colon, which allowed entry into the lesser sac, which was relatively spared of disease. The greater omentum was then taken off the right gastroepiploic vessel, preserving it and taking the nodules in the gastrosplenic ligament en bloc with the spleen with short gastric vessels divided with the EnSeal device. The lienophrenic ligament was divided well distant from the spleen and  reflected anteriorly. Remainder of  the splenic flexure was then taken down completely. The splenic hilum was separated from the tail of the pancreas and the splenic vessels were ligated with silk ligatures. The spleen and omentum were then dissected off the residual structures and the omentum and spleen were then sent to pathology.  The large bowel was then run, few small nodule were taken off the mesocolon, fulgurated. The sigmoid was mobilized on to the left lower quadrant and the left gutter was stripped and sent to Pathology as a separate specimen, skeletonizing the gonadal vessels, vas deferens, ureter and iliac system on the left side. The heavily tattooed area was mobilized after dividing some adhesions in the peritoneum, was in the mid sigmoid. A 0.5 cm proximal and distal to the area of tattooing was divided with a GIA stapler. The mesosigmoid was taken out with EnSeal device and the sigmoid was sent to Pathology as separate specimen after marking the proximal end. Low volume disease then stripped out of the pelvis, completing of gross disease being resected from the pelvis.  A side-to-side functional end-to-end anastomosis was created with a GIA stapler between the proximal and distal sigmoid. The open end of the bowel closed with a second application of the GIA stapler. This was under no tension as the splenic flexure being completely mobilized.  The small bowel was then anastomosed to the transverse colon to the anterior tenia with a GIA stapler. The open end of the bowel closed with a second application of the GIA stapler. This was under no tension whatsoever and was well vascularized throughout.  The right lobe of the liver was then completely mobilized by dividing the coronary and right triangular ligaments. The anterior lamina of Gerota's fascia was then taken, which appeared to have sclerotic disease upon it and the diaphragm was then entirely stripped of peritoneum with traction and  countertraction and the peritonectomy from the right diaphragm and anterior lamina of Gerota's fascia was then sent to Pathology. Few lesser omental nodules were excised and the nasogastric tube was confirmed to be in good position at this time.  At this point, we proceeded after starting with peritoneal carcinomatosis index of 14. The patient did undergo an R1 resection and it was elected to proceed with hyperthermic intraperitoneal chemotherapy to four separate stab wounds, 22-French inflow cannulas and 34-French outflow cannulas were placed. Each cannula was sutured into position with a suture of silk, clips were placed on the outflow cannulas to allow easy return. After counts were correct, the skin was closed with running #1 Vicryl suture. A 3 liter crystalloid prime was used to initiate perfusion through the circuit. Flow rates were maintained at 1 liter per minute and temperature was manipulated to maintain outflow temperature of 40-degrees. The patient was given 30 mg of mitomycin C x0 with an additional 10 mg bolus given into the perfusion circuit one hour and perfusion for a total of two hours of hyperthermic intraperitoneal chemotherapy was proceeded uneventfully. At the end of the tongue perfusions it was washed out.  The abdomen was then reopened. The cannulas were removed, Bookwalter retractors was placed. Hemostasis was again found to be secure throughout. The mesenteric defects at the colocolostomy and ileal colostomy as well as a small area in the left transverse mesocolon was closed, which was resected with the omentectomy closed mesenteric defect with 3-0 PDS. After repeat counts were correct, the surgeon's gloves, gowns and instruments were then changed.  The linea alba was closed with looped #1 suture of PDS.  Subcutaneous tissue reapproximated with Vicryl, skin closed with subcuticular suture at the midline as well as at the four cannula sites. Sterile dressings were applied.  The  patient was subsequently aroused, taken to recovery room, hemodynamically stable with dry dressing clean and intact. The patient had no family with him to inform of the procedure and the main contact was not available by phone and his message box on his answering machine was full.

## 2019-06-30 NOTE — ED Notes (Signed)
Pt requested [name] be his emergency contact [phone]

## 2019-06-30 NOTE — ED Notes (Signed)
Attempted report 

## 2019-06-30 NOTE — ED Provider Notes (Signed)
[facility]ERG[facility] DEPARTMENT Provider Note   CSN: [id number] Arrival date & time: 06/30/19  1026     History Chief Complaint  Patient presents with  . Abdominal Pain    Farid Grigorian is a 49 y.o. male.  HPI  49 yo male presents complaining complaining of ongoing abdominal pain - patient seen 2/1 with obstruction, left ama, then returned next day and stated he had had a large bowel movement now states he has twisted bowel.  States he is short of breath since ooperation October 2019, states removed left lung muscle.  Dyspnea worse since this am with cough, states mucous with cough yellow.  Also vomiting yellow green and has bad odor.  Denies fever.  Port on right, since 2019 no problems.  Not currently undergoing chemo From last note at [facility]:    HISTORY OF THE PRESENT ILLNESS: Kooper Godshall is a 49 y.o. male for evaluation of a cancer with peritoneal dissemination. The patient initially complained of abdominal pain in September 2018. He underwent a CT which showed a fluid-filled structure with rim enhancement in the right lower quadrant which was in the location of a dilated appendix. He left AMA prior to planned drainage. Presented back in March 2019 with abdominal pain and N/V a repeat CT showed absence of appendix, dilated loops of small bowel and ileal thickening. He was readmitted in May and this time CT revealed revealed masslike thickening at the sigmoid colon, matted soft tissue was noted in the right lower quadrant adjacent to the cecum and terminal ileum, moderate volume ascites, and omental caking in the left upper abdomen. Paracentesis was performed and 1.5 L fluid was evacuated and cytology was reported negative. He underwent colonoscopy and multiple polyps were removed, all came back as TA except one from appendiceal orifice which showed poorly diff adenocarcinoma.   He has recieved FOLFOX per Dr. Benay Spice   He recently underwent a Exploratory laparotomy,  extensive cytoreductive surgery including resection of right colon, sigmoid colon, spleen, omentum, right diaphragm peritonectomy,k placement of four chemoperfusion cannula, and hyperthermic intraperitoneal chemotherapy with mitomycin C following an R1 type resection on 02/24/2018. Then in August 2020 he underwent a left heart cath. He was schedule to return in April 2020 then extended it to June 2020 due to transportation issues. We worked with Dr. Gearldine Shown office for his [facility].  He was last seen by Dr Benay Spice 05/08/2019 with his last CT 03/30/2019 for LUQ pain and N/V. No bowel obstructions were noted.  06/03/2019: Currently he complains of abdominal pain, anorexia, constipation and reduced gas production. He can not tolerate large amounts solid foods and sometimes liquids do not process quickly. He has only lost 2 pounds since his last appointment. Patient is concerned that he can not afford the supplemental drinks. He tolerates a full liquid diet most of the time. Patient denied fever, changes in surgical site, drainage, redness, yellowing of the eyes/skin, cough, SOB or chest pain. He has concerns regarding sleep habits that he blames on financial stress.    Past Medical History:  Diagnosis Date  . Abdominal pain 09/2017  . Anxiety attack   . apendix ca dx'd 09/2018   with intra abd mets  . Headache    "frequently" (08/14/2017)  . Heart murmur    "when I was a kid" (08/14/2017)  . Small bowel obstruction ([place]) 08/13/2017    Patient Active Problem List   Diagnosis Date Noted  . SVT (supraventricular tachycardia) ([place]) 01/02/2019  .  Unstable angina ([place])   . Atrial tachycardia ([place])   . Appendix carcinoma ([place]) 12/19/2018  . Port-A-Cath in place 12/02/2017  . 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 Metastatic carcinoma ([place]) 10/24/2017  . Goals of care, counseling/discussion 10/24/2017  . Abdominal pain 09/24/2017  . Ascites 09/24/2017  . SBO (small  bowel obstruction) ([place]) 08/13/2017  . Right lower quadrant abdominal abscess ([place]) 01/26/2017    Past Surgical History:  Procedure Laterality Date  . BIOPSY  10/02/2017   Procedure: BIOPSY;  Surgeon: [name], MD;  Location: [facility] ENDOSCOPY;  Service: Gastroenterology;;  . COLONOSCOPY WITH PROPOFOL N/A 10/02/2017   Procedure: COLONOSCOPY WITH PROPOFOL;  Surgeon: [name], MD;  Location: [place];  Service: Gastroenterology;  Laterality: N/A;  . FINGER SURGERY Left    "thumb"  . IR IMAGING GUIDED PORT INSERTION  11/01/2017  . IR PARACENTESIS  09/24/2017  . IR US GUIDE VASC ACCESS RIGHT  11/01/2017  . LEFT HEART CATH AND CORONARY ANGIOGRAPHY N/A 01/02/2019   Procedure: LEFT HEART CATH AND CORONARY ANGIOGRAPHY;  Surgeon: Wellington Hampshire, MD;  Location: [place] CV LAB;  Service: Cardiovascular;  Laterality: N/A;  . POLYPECTOMY N/A 10/02/2017   Procedure: POLYPECTOMY;  Surgeon: [name], MD;  Location: [place];  Service: Gastroenterology;  Laterality: N/A;  . SUBMUCOSAL INJECTION  10/02/2017   Procedure: SUBMUCOSAL INJECTION;  Surgeon: [name], MD;  Location: [facility] ENDOSCOPY;  Service: Gastroenterology;;  . TYMPANOSTOMY TUBE PLACEMENT Bilateral        Family History  Problem Relation Age of Onset  . Prostate cancer Paternal Grandfather   . Crohn's disease Neg Hx     Social History   Tobacco Use  . Smoking status: Current Some Day Smoker    Packs/day: 1.00    Types: Cigarettes    Last attempt to quit: 09/28/2016    Years since quitting: 2.7  . Smokeless tobacco: Never Used  . Tobacco comment: 08/14/2017 "I quit; can't tell you when I quit or when I started"  Substance Use Topics  . Alcohol use: Yes    Comment: 08/14/2017 "I drink when I feel like it; nothing recently"  . Drug use: Not Currently    Types: Marijuana    Home Medications Prior to Admission medications   Medication Sig Start Date End Date Taking? Authorizing Provider  ALPRAZolam Duanne Moron) 0.5 MG  tablet Take 1 tablet (0.5 mg total) by mouth 2 (two) times daily as needed for anxiety. 06/29/19   Ladell Pier, MD  aspirin EC 81 MG tablet Take 1 tablet (81 mg total) by mouth daily. Patient not taking: Reported on 06/22/2019 02/09/19 02/09/20  Ladell Pier, MD  carvedilol (COREG) 3.125 MG tablet Take 1 tablet (3.125 mg total) by mouth 2 (two) times daily with a meal. Patient not taking: Reported on 06/22/2019 01/03/19   Leanor Kail, PA  HYDROcodone-acetaminophen (NORCO/VICODIN) 5-325 MG tablet Take 1-2 tablets by mouth every 6 (six) hours as needed for moderate pain. Patient not taking: Reported on 06/22/2019 03/30/19   Owens Shark, NP  hydrocortisone (ANUSOL-HC) 2.5 % rectal cream Place 1 application rectally 2 (two) times daily. 06/18/19   Ladell Pier, MD  loperamide (IMODIUM) 2 MG capsule Take 2 capsules (4 mg total) by mouth as needed for diarrhea or loose stools. Maximum of 8 capsules/day 01/03/19   Bhagat, Crista Luria, PA  omeprazole (PRILOSEC) 20 MG capsule Take 1 capsule (20 mg total) by mouth daily. Patient not taking: Reported on 06/22/2019 01/03/19  Bhagat, Bhavinkumar, PA  ondansetron (ZOFRAN) 8 MG tablet Take 1 tablet (8 mg total) by mouth every 8 (eight) hours as needed for nausea or vomiting. Patient not taking: Reported on 06/22/2019 01/03/19   Leanor Kail, PA  oxyCODONE (ROXICODONE) 5 MG immediate release tablet Take 1 tablet (5 mg total) by mouth every 4 (four) hours as needed for severe pain. 06/23/19   Blanchie Dessert, MD  oxyCODONE-acetaminophen (PERCOCET/ROXICET) 5-325 MG tablet Take 1 tablet by mouth every 4 (four) hours as needed for severe pain. 06/29/19   Owens Shark, NP  polyethylene glycol powder (GLYCOLAX/MIRALAX) 17 GM/SCOOP powder Take 17 g by mouth daily. 06/18/19   Ladell Pier, MD  prochlorperazine (COMPAZINE) 10 MG tablet Take 1 tablet (10 mg total) by mouth every 6 (six) hours as needed for nausea. Patient not taking: Reported on 06/22/2019  12/31/18   Garner Nash, DO    Allergies    Piperacillin-tazobactam in dex  Review of Systems   Review of Systems  Physical Exam Updated Vital Signs BP 104/70 (BP Location: Left Arm)   Pulse (!) 113   Temp 97.7 F (36.5 C) (Oral)   Resp (!) 30   Ht 1.778 m (5' 10" )   Wt 43.1 kg   SpO2 98%   BMI 13.63 kg/m   Physical Exam Vitals and nursing note reviewed.  Constitutional:      Appearance: He is well-developed.     Comments: cacechtic  HENT:     Head: Normocephalic.     Mouth/Throat:     Mouth: Mucous membranes are moist.  Eyes:     Extraocular Movements: Extraocular movements intact.     Pupils: Pupils are equal, round, and reactive to light.  Cardiovascular:     Rate and Rhythm: Normal rate.     Heart sounds: Normal heart sounds.  Pulmonary:     Effort: Pulmonary effort is normal.     Comments: Decreased throughout, more on left than on right Neurological:     Mental Status: He is alert.     ED Results / Procedures / Treatments   Labs (all labs ordered are listed, but only abnormal results are displayed) Labs Reviewed - No data to display  EKG None  ED ECG REPORT   Date: 06/30/2019  Rate: 112  Rhythm: sinus tachycardia  QRS Axis: normal  Intervals: normal  ST/T Wave abnormalities: nonspecific T wave changes  Conduction Disutrbances:r prime  Narrative Interpretation:   Old EKG Reviewed: none available  I have personally reviewed the EKG tracing and agree with the computerized printout as noted.   Radiology CT Angio Chest PE W and/or Wo Contrast  Result Date: 06/30/2019 CLINICAL DATA:  Shortness of breath. EXAM: CT ANGIOGRAPHY CHEST WITH CONTRAST TECHNIQUE: Multidetector CT imaging of the chest was performed using the standard protocol during bolus administration of intravenous contrast. Multiplanar CT image reconstructions and MIPs were obtained to evaluate the vascular anatomy. CONTRAST:  19m OMNIPAQUE IOHEXOL 300 MG/ML  SOLN COMPARISON:   03/30/2019 FINDINGS: Cardiovascular: There are no pulmonary emboli. Heart size is normal. The heart and other mediastinal structures are slightly shifted to the right by the large left pleural effusion. Mediastinum/Nodes: No enlarged mediastinal, hilar, or axillary lymph nodes. Thyroid gland, trachea, and esophagus demonstrate no significant findings. Lungs/Pleura: There has been a marked increase in the left pleural effusion with almost complete compressive atelectasis of left lower lobe and partial compressive atelectasis of the left upper lobe. Tiny right effusion. Small bleb in the  right lower lobe, unchanged. Chronic irregular thickening of the fissures with slight pleural nodularity as described on the prior exam. This is unchanged. Upper Abdomen: Hepatomegaly. Ascites. Musculoskeletal: No chest wall abnormality. No acute or significant osseous findings. Review of the MIP images confirms the above findings. IMPRESSION: 1. No pulmonary emboli. 2. Marked increase in the large left pleural effusion with almost complete compressive atelectasis of the left lower lobe and partial compressive atelectasis of the left upper lobe. This effusion has a mass effect shifting the heart and other mediastinal structures to the right. 3. Tiny right effusion. 4. Hepatomegaly. 5. Ascites. Electronically Signed   By: Lorriane Shire M.D.   On: 06/30/2019 14:57   CT ABDOMEN PELVIS W CONTRAST  Result Date: 06/30/2019 CLINICAL DATA:  49 year old with acute abdominal pain and neutropenia. History of appendiceal cancer. EXAM: CT ABDOMEN AND PELVIS WITH CONTRAST TECHNIQUE: Multidetector CT imaging of the abdomen and pelvis was performed using the standard protocol following bolus administration of intravenous contrast. CONTRAST:  73m OMNIPAQUE IOHEXOL 300 MG/ML  SOLN COMPARISON:  06/22/1999 FINDINGS: Lower chest: Again noted is large pleural effusion at the left lung base with pleural enhancement. There appears to be extensive volume  loss in the left lower lobe. Findings are suggestive for a complex or loculated left pleural effusion. Again noted is irregular pleural thickening along the right major fissure and mild pleural thickening at right lung base. Hepatobiliary: Common bile duct measures roughly 7 mm and this is similar to the previous examination. Stable mild intrahepatic biliary dilatation. Main portal venous system patent. Pancreas: Unremarkable. No pancreatic ductal dilatation or surrounding inflammatory changes. Spleen: Absent Adrenals/Urinary Tract: Limited evaluation of the adrenal glands. Fullness in the renal collecting systems bilaterally and similar to the prior examination. Mild to moderate amount of fluid in the urinary bladder. Stomach/Bowel: Distal rectum is distended with gas and stool. The proximal rectum just beyond the surgical anastomosis again demonstrates diffuse wall thickening and concerning for a soft tissue lesion or neoplastic process. Area of wall thickening measures at least 4.9 cm in length and similar to the recent examination. There is a surgical anastomosis just proximal to this area of wall thickening and the colon proximal to the lesion is markedly distended and contains a large amount of stool. Findings are suggestive for a mechanical obstruction at this level. Large amount of stool throughout the left colon. There is a surgical bowel anastomosis the upper mid abdomen and findings are compatible with right hemicolectomy. Again noted are dilated loops of small bowel throughout the abdomen. No gross abnormality to the stomach. Vascular/Lymphatic: Mild atherosclerotic disease in abdominal aorta and iliac arteries without aneurysm or dissection. The main visceral arteries are patent. This is a predominantly arterial phase examination and limited evaluation of the venous structures. Portal venous system is patent. No significant abdominopelvic lymphadenopathy. Reproductive: Limited evaluation of the  prostate. Other: Minimal intra-abdominal and subcutaneous fat in the abdomen or pelvis. No evidence for free intraperitoneal air. A small amount of ascites, particularly in the anterior lower abdomen on sequence 2, image 54. Ascites appears to be similar to the recent comparison examination. Musculoskeletal: No acute bone abnormality. IMPRESSION: 1. Distal large bowel obstruction. Again noted is soft tissue thickening and irregularity involving the rectum just distal to the surgical anastomosis. Findings are concerning for neoplastic disease. Again noted is marked distension of the colon proximal to the lesion. The distended colon contains a large amount of stool. 2. Small amount of ascites, similar to the previous  examination. 3. Large loculated left pleural effusion with marked volume loss in left lower lobe. There is also evidence for pleural thickening in the right lung concerning for metastatic disease. Chest findings are similar to the recent comparison examination. 4. Stable fullness of the renal collecting systems. 5. Mild intrahepatic and extrahepatic biliary dilatation is stable. Electronically Signed   By: Markus Daft M.D.   On: 06/30/2019 15:36    Procedures Procedures (including critical care time)  Medications Ordered in ED Medications - No data to display  ED Course  I have reviewed the triage vital signs and the nursing notes.  Pertinent labs & imaging results that were available during my care of the patient were reviewed by me and considered in my medical decision making (see chart for details).    MDM Rules/Calculators/A&P                      1- appendiceal cancer 2- distal large bowel obstruction likely secondary to #1 3-pleural effusion left loculated 4-cachexia Discussed with Saverio Danker on for general surgery and they will see in consult Discussed with Drs Garlan Fillers and Volanda Napoleon on for [facility] and will see for admission   Final Clinical Impression(s) / ED  Diagnoses Final diagnoses:  Large bowel obstruction ([place])  Pleural effusion on left  Encounter for follow-up surveillance of appendiceal cancer    Rx / DC Orders ED Discharge Orders    None       Pattricia Boss, MD 06/30/19 1624

## 2019-06-30 NOTE — Progress Notes (Signed)
I evaluated this patient and discussed the management plan with the resident before 7 PM today. I will cosign their H&P once it is completed.

## 2019-06-30 NOTE — Progress Notes (Signed)
Received patient from ED, AOx4, ambulatory, VSS with elevated PR at 102 due to abdominal pain at 10/10, oriented to room, bed controls, call light and plan of care.  Administered PRN pain medication Dilaudid 53m IV per order.  Pt. Now resting on bed with both eyes closed.  Will monitor.

## 2019-06-30 NOTE — ED Triage Notes (Signed)
Pt from extended stay hotel via ems; c/o generalized abd pain, n/v/ x 3-4 weeks; had been evaluated twice for the same; colon cancer pt stage 4; uncooperative with ems; BP 82 palp, 120 HR 18 RR, 97% RA; refused IV, monitoring with ems

## 2019-06-30 NOTE — Progress Notes (Signed)
Patient left VM this morning at 0935 that RN retrieved at 1230. C/O abdominal pain, "can't catch my breath", and vomiting bile. Reports he is anxious and requesting advice on what to do. Noted upon pulling up chart that he had already arrived at the emergency room. Dr. Benay Spice is aware.

## 2019-06-30 NOTE — Consult Note (Signed)
Subjective: Patient known to our service as we recently saw him about a week ago over at [facility].  He is a 49 yo white male with history of metastatic appendiceal carcinoma.  He underwent HIPEC along with a right colectomy and sigmoid colectomy with anastomosis by Dr. Clovis Riley at [facility] in 2019.  He was undergoing chemotherapy with Folfiri and then ultimately irinotecan.  He was admitted with a STEMI thought to be secondary to his chemotherapy and a EF of 20-25% in August of 2020.  He underwent a flex sigmoid in July of 2020 that revealed likely extrinsic compression from a tumor at the surgical anastomosis between his left colon and his rectum.  He had been on laxatives and tolerating fairly well since that time.  However, over the last 2-3 months this has been increasingly more problematic.  He now has difficulty eating any food despite being hungry.  He only moves his bowels every 3-4 days and tiny little amounts.  He came to the hospital last week but had to leave because he had to pay his rent.  He comes back today due to severe abdominal pain.  He has a CT scan that once again shows a large bowel obstruction secondary to this area known from prior flex sig.  He also has a massive left pleural effusion along with some shortness of breath.  We have been asked to see him for further recommendations.  ROS: See above, otherwise other systems negative  Objective: Vital signs in last 24 hours: Temp:  [97.7 F (36.5 C)] 97.7 F (36.5 C) (02/09 1035) Pulse Rate:  [104-113] 104 (02/09 1130) Resp:  [22-31] 31 (02/09 1630) BP: (101-112)/(69-86) 111/86 (02/09 1630) SpO2:  [97 %-98 %] 97 % (02/09 1130) Weight:  [43.1 kg] 43.1 kg (02/09 1032)    Intake/Output from previous day: No intake/output data recorded. Intake/Output this shift: Total I/O In: 500 [IV Piggyback:500] Out: -   PE: General: pleasant, cachetic white male who is laying in bed and frustrated secondary to pain in his  abdomen. HEENT: head is normocephalic, atraumatic.  Sclera are noninjected.  PERRL.  Ears and nose without any masses or lesions.  Mouth is pink and dry Heart: regular, rate, and rhythm.  Normal s1,s2. No obvious murmurs, gallops, or rubs noted.  Palpable radial and pedal pulses bilaterally Lungs: CTAB, but decrease breath sounds on left side, no wheezes, rhonchi, or rales noted.  Respiratory effort nonlabored Abd: distended, very tender to palpation diffusely, colon is visible +BS, no masses, hernias, or organomegaly, prior midline scar from previous surgery MS: all 4 extremities are symmetrical with no cyanosis, clubbing, or edema, but with significant muscle atrophy Skin: warm and dry with no masses, lesions, or rashes Neuro: Cranial nerves 2-12 grossly intact, speech is normal Psych: A&Ox3 with an appropriate affect, tearful at times very appropriately.   Lab Results:  Recent Labs    06/30/19 1157  WBC 11.6*  HGB 13.7  HCT 42.9  PLT 790*   BMET Recent Labs    06/30/19 1157  NA 139  K 4.4  CL 102  CO2 26  GLUCOSE 75  BUN 11  CREATININE 0.71  CALCIUM 9.0   PT/INR No results for input(s): LABPROT, INR in the last 72 hours. CMP     Component Value Date/Time   NA 139 06/30/2019 1157   K 4.4 06/30/2019 1157   CL 102 06/30/2019 1157   CO2 26 06/30/2019 1157   GLUCOSE  75 06/30/2019 1157   BUN 11 06/30/2019 1157   CREATININE 0.71 06/30/2019 1157   CREATININE 0.55 (L) 06/25/2019 1157   CALCIUM 9.0 06/30/2019 1157   PROT 7.0 06/30/2019 1157   ALBUMIN 2.6 (L) 06/30/2019 1157   AST 25 06/30/2019 1157   AST 79 (H) 06/25/2019 1157   ALT 64 (H) 06/30/2019 1157   ALT 208 (H) 06/25/2019 1157   ALKPHOS 507 (H) 06/30/2019 1157   BILITOT 0.5 06/30/2019 1157   BILITOT 0.5 06/25/2019 1157   GFRNONAA >60 06/30/2019 1157   GFRNONAA >60 06/25/2019 1157   GFRAA >60 06/30/2019 1157   GFRAA >60 06/25/2019 1157   Lipase     Component Value Date/Time   LIPASE 19 06/23/2019 1156         Studies/Results: CT Angio Chest PE W and/or Wo Contrast  Result Date: 06/30/2019 CLINICAL DATA:  Shortness of breath. EXAM: CT ANGIOGRAPHY CHEST WITH CONTRAST TECHNIQUE: Multidetector CT imaging of the chest was performed using the standard protocol during bolus administration of intravenous contrast. Multiplanar CT image reconstructions and MIPs were obtained to evaluate the vascular anatomy. CONTRAST:  63m OMNIPAQUE IOHEXOL 300 MG/ML  SOLN COMPARISON:  03/30/2019 FINDINGS: Cardiovascular: There are no pulmonary emboli. Heart size is normal. The heart and other mediastinal structures are slightly shifted to the right by the large left pleural effusion. Mediastinum/Nodes: No enlarged mediastinal, hilar, or axillary lymph nodes. Thyroid gland, trachea, and esophagus demonstrate no significant findings. Lungs/Pleura: There has been a marked increase in the left pleural effusion with almost complete compressive atelectasis of left lower lobe and partial compressive atelectasis of the left upper lobe. Tiny right effusion. Small bleb in the right lower lobe, unchanged. Chronic irregular thickening of the fissures with slight pleural nodularity as described on the prior exam. This is unchanged. Upper Abdomen: Hepatomegaly. Ascites. Musculoskeletal: No chest wall abnormality. No acute or significant osseous findings. Review of the MIP images confirms the above findings. IMPRESSION: 1. No pulmonary emboli. 2. Marked increase in the large left pleural effusion with almost complete compressive atelectasis of the left lower lobe and partial compressive atelectasis of the left upper lobe. This effusion has a mass effect shifting the heart and other mediastinal structures to the right. 3. Tiny right effusion. 4. Hepatomegaly. 5. Ascites. Electronically Signed   By: JLorriane ShireM.D.   On: 06/30/2019 14:57   CT ABDOMEN PELVIS W CONTRAST  Result Date: 06/30/2019 CLINICAL DATA:  49year old with acute abdominal  pain and neutropenia. History of appendiceal cancer. EXAM: CT ABDOMEN AND PELVIS WITH CONTRAST TECHNIQUE: Multidetector CT imaging of the abdomen and pelvis was performed using the standard protocol following bolus administration of intravenous contrast. CONTRAST:  983mOMNIPAQUE IOHEXOL 300 MG/ML  SOLN COMPARISON:  06/22/1999 FINDINGS: Lower chest: Again noted is large pleural effusion at the left lung base with pleural enhancement. There appears to be extensive volume loss in the left lower lobe. Findings are suggestive for a complex or loculated left pleural effusion. Again noted is irregular pleural thickening along the right major fissure and mild pleural thickening at right lung base. Hepatobiliary: Common bile duct measures roughly 7 mm and this is similar to the previous examination. Stable mild intrahepatic biliary dilatation. Main portal venous system patent. Pancreas: Unremarkable. No pancreatic ductal dilatation or surrounding inflammatory changes. Spleen: Absent Adrenals/Urinary Tract: Limited evaluation of the adrenal glands. Fullness in the renal collecting systems bilaterally and similar to the prior examination. Mild to moderate amount of fluid in the urinary bladder.  Stomach/Bowel: Distal rectum is distended with gas and stool. The proximal rectum just beyond the surgical anastomosis again demonstrates diffuse wall thickening and concerning for a soft tissue lesion or neoplastic process. Area of wall thickening measures at least 4.9 cm in length and similar to the recent examination. There is a surgical anastomosis just proximal to this area of wall thickening and the colon proximal to the lesion is markedly distended and contains a large amount of stool. Findings are suggestive for a mechanical obstruction at this level. Large amount of stool throughout the left colon. There is a surgical bowel anastomosis the upper mid abdomen and findings are compatible with right hemicolectomy. Again noted are  dilated loops of small bowel throughout the abdomen. No gross abnormality to the stomach. Vascular/Lymphatic: Mild atherosclerotic disease in abdominal aorta and iliac arteries without aneurysm or dissection. The main visceral arteries are patent. This is a predominantly arterial phase examination and limited evaluation of the venous structures. Portal venous system is patent. No significant abdominopelvic lymphadenopathy. Reproductive: Limited evaluation of the prostate. Other: Minimal intra-abdominal and subcutaneous fat in the abdomen or pelvis. No evidence for free intraperitoneal air. A small amount of ascites, particularly in the anterior lower abdomen on sequence 2, image 54. Ascites appears to be similar to the recent comparison examination. Musculoskeletal: No acute bone abnormality. IMPRESSION: 1. Distal large bowel obstruction. Again noted is soft tissue thickening and irregularity involving the rectum just distal to the surgical anastomosis. Findings are concerning for neoplastic disease. Again noted is marked distension of the colon proximal to the lesion. The distended colon contains a large amount of stool. 2. Small amount of ascites, similar to the previous examination. 3. Large loculated left pleural effusion with marked volume loss in left lower lobe. There is also evidence for pleural thickening in the right lung concerning for metastatic disease. Chest findings are similar to the recent comparison examination. 4. Stable fullness of the renal collecting systems. 5. Mild intrahepatic and extrahepatic biliary dilatation is stable. Electronically Signed   By: Markus Daft M.D.   On: 06/30/2019 15:36    Anti-infectives: Anti-infectives (From admission, onward)   None       Assessment/Plan Left pleural effusion - defer to medicine but should probably get an IR thoracentesis SPCM - prealbumin pending Chemo induced heart failure - EF 20-25% on last echo in august 2020 H/O STEMI  Stage 4  appendiceal carcinoma with large bowel obstruction secondary to metastatic disease The patient has a large bowel obstruction.  Unfortunately, we have discussed with GI and he is not a candidate for a stent to be placed to relieve this obstruction.  Because of that, he will require surgical intervention with likely a diverting loop transverse colostomy.  This will likely not be easy secondary to his prior surgical history, but he has no other options given his pain and obstruction.  The patient is agreeable with this plan.  He should remain NPO with the hopes that maybe we can get this done tomorrow for now.  After more discussion, the patient has also requested to see the palliative care service to help him make decisions on goals of care and how he would like to proceed with the rest of his life.  I have written that consult.  His prealbumin is pending.  He is clearly very malnourished. Hopefully we can correct this blockage and get him eating.  All of this has been discussed with the patient.  He is agreeable with this plan  thus far.  His biggest concern right now is pain management.  I have written for some pain medication for him to help.  We will follow along and decide on timing of surgery.   FEN - NPO VTE - per primary service, ok from surgery standpoint ID - none needed from our standpoint   LOS: 0 days    Henreitta Cea , [facility] 06/30/2019, 4:45 PM Please see Amion for pager number during day hours 7:00am-4:30pm or 7:00am -11:30am on weekends

## 2019-07-01 ENCOUNTER — Inpatient Hospital Stay (HOSPITAL_COMMUNITY): Payer: Medicaid Other

## 2019-07-01 DIAGNOSIS — K56609 Unspecified intestinal obstruction, unspecified as to partial versus complete obstruction: Secondary | ICD-10-CM

## 2019-07-01 DIAGNOSIS — Z7189 Other specified counseling: Secondary | ICD-10-CM

## 2019-07-01 DIAGNOSIS — R140 Abdominal distension (gaseous): Secondary | ICD-10-CM

## 2019-07-01 DIAGNOSIS — R109 Unspecified abdominal pain: Secondary | ICD-10-CM

## 2019-07-01 DIAGNOSIS — C7A020 Malignant carcinoid tumor of the appendix: Secondary | ICD-10-CM

## 2019-07-01 DIAGNOSIS — C787 Secondary malignant neoplasm of liver and intrahepatic bile duct: Secondary | ICD-10-CM

## 2019-07-01 DIAGNOSIS — Z515 Encounter for palliative care: Secondary | ICD-10-CM

## 2019-07-01 DIAGNOSIS — Z08 Encounter for follow-up examination after completed treatment for malignant neoplasm: Secondary | ICD-10-CM

## 2019-07-01 DIAGNOSIS — C786 Secondary malignant neoplasm of retroperitoneum and peritoneum: Secondary | ICD-10-CM

## 2019-07-01 DIAGNOSIS — J90 Pleural effusion, not elsewhere classified: Secondary | ICD-10-CM

## 2019-07-01 HISTORY — PX: IR THORACENTESIS ASP PLEURAL SPACE W/IMG GUIDE: IMG5380

## 2019-07-01 LAB — BASIC METABOLIC PANEL
Anion gap: 13 (ref 5–15)
BUN: 16 mg/dL (ref 6–20)
CO2: 23 mmol/L (ref 22–32)
Calcium: 9 mg/dL (ref 8.9–10.3)
Chloride: 103 mmol/L (ref 98–111)
Creatinine, Ser: 0.78 mg/dL (ref 0.61–1.24)
GFR calc Af Amer: >60 mL/min (ref >60)
GFR calc non Af Amer: >60 mL/min (ref >60)
Glucose, Bld: 85 mg/dL (ref 70–99)
Potassium: 4.3 mmol/L (ref 3.5–5.1)
Sodium: 139 mmol/L (ref 135–145)

## 2019-07-01 LAB — CBC
HCT: 41.9 % (ref 39.0–52.0)
Hemoglobin: 13.3 g/dL (ref 13.0–17.0)
MCH: 30.2 pg (ref 26.0–34.0)
MCHC: 31.7 g/dL (ref 30.0–36.0)
MCV: 95.2 fL (ref 80.0–100.0)
Platelets: 826 10*3/uL — ABNORMAL HIGH (ref 150–400)
RBC: 4.4 MIL/uL (ref 4.22–5.81)
RDW: 15.9 % — ABNORMAL HIGH (ref 11.5–15.5)
WBC: 12 10*3/uL — ABNORMAL HIGH (ref 4.0–10.5)
nRBC: 0 % (ref 0.0–0.2)

## 2019-07-01 LAB — ALBUMIN: Albumin: 2.6 g/dL — ABNORMAL LOW (ref 3.5–5.0)

## 2019-07-01 LAB — PROTEIN, TOTAL: Total Protein: 7.2 g/dL (ref 6.5–8.1)

## 2019-07-01 LAB — LACTATE DEHYDROGENASE: LDH: 154 U/L (ref 98–192)

## 2019-07-01 MED ORDER — LIDOCAINE HCL (PF) 1 % IJ SOLN
INTRAMUSCULAR | Status: DC | PRN
  Administered 2019-07-01: 10 mL

## 2019-07-01 MED ORDER — PANTOPRAZOLE SODIUM 40 MG IV SOLR
40.0000 mg | INTRAVENOUS | Status: DC
  Administered 2019-07-01 – 2019-07-02 (×2): 40 mg via INTRAVENOUS
  Filled 2019-07-01 (×3): qty 40

## 2019-07-01 MED ORDER — ALUM & MAG HYDROXIDE-SIMETH 200-200-20 MG/5ML PO SUSP
30.0000 mL | Freq: Four times a day (QID) | ORAL | Status: DC | PRN
  Filled 2019-07-01: qty 30

## 2019-07-01 MED ORDER — ONDANSETRON HCL 4 MG/2ML IJ SOLN
4.0000 mg | Freq: Four times a day (QID) | INTRAMUSCULAR | Status: DC | PRN
  Administered 2019-07-01 – 2019-07-02 (×2): 4 mg via INTRAVENOUS
  Filled 2019-07-01 (×2): qty 2

## 2019-07-01 MED ORDER — BOOST / RESOURCE BREEZE PO LIQD CUSTOM
1.0000 | Freq: Three times a day (TID) | ORAL | Status: DC
  Administered 2019-07-01: 1 via ORAL

## 2019-07-01 MED ORDER — LIDOCAINE VISCOUS HCL 2 % MT SOLN
15.0000 mL | Freq: Once | OROMUCOSAL | Status: AC
  Administered 2019-07-01: 15 mL via ORAL
  Filled 2019-07-01: qty 15

## 2019-07-01 MED ORDER — ADULT MULTIVITAMIN W/MINERALS CH
1.0000 | ORAL_TABLET | Freq: Every day | ORAL | Status: DC
  Administered 2019-07-02: 11:00:00 1 via ORAL
  Filled 2019-07-01: qty 1

## 2019-07-01 MED ORDER — PANTOPRAZOLE SODIUM 40 MG IV SOLR
40.0000 mg | Freq: Once | INTRAVENOUS | Status: AC
  Administered 2019-07-01: 23:00:00 40 mg via INTRAVENOUS
  Filled 2019-07-01: qty 40

## 2019-07-01 MED ORDER — HEPARIN SODIUM (PORCINE) 5000 UNIT/ML IJ SOLN
5000.0000 [IU] | Freq: Three times a day (TID) | INTRAMUSCULAR | Status: DC
  Administered 2019-07-01: 5000 [IU] via SUBCUTANEOUS
  Filled 2019-07-01 (×2): qty 1

## 2019-07-01 MED ORDER — LIDOCAINE HCL 1 % IJ SOLN
INTRAMUSCULAR | Status: AC
  Filled 2019-07-01: qty 20

## 2019-07-01 MED ORDER — CARVEDILOL 3.125 MG PO TABS: 3.1250 mg | ORAL_TABLET | Freq: Two times a day (BID) | ORAL | Status: DC

## 2019-07-01 MED ORDER — CARVEDILOL 3.125 MG PO TABS
3.1250 mg | ORAL_TABLET | Freq: Two times a day (BID) | ORAL | Status: DC
  Administered 2019-07-02: 3.125 mg via ORAL
  Filled 2019-07-01 (×3): qty 1

## 2019-07-01 NOTE — Procedures (Signed)
PROCEDURE SUMMARY:  Successful US guided left thoracentesis. Yielded 900 mL of amber fluid. Patient tolerated procedure well. No immediate complications. EBL = trace  Specimen was sent for labs.  Post procedure chest X-ray pending. Judie Grieve Yosselin Zoeller PA-C 07/01/2019 2:47 PM

## 2019-07-01 NOTE — Progress Notes (Signed)
IP PROGRESS NOTE  Subjective:   Derek Lloyd is well-known to me with a history of metastatic appendix carcinoma.  Derek Lloyd has developed progressive obstructive symptoms over the past month.  Derek Lloyd has been unable to attend scheduled follow-up with Dr. Clovis Riley. Derek Lloyd was admitted yesterday with increased abdominal pain and nausea/vomiting.  Derek Lloyd reports dyspnea. Derek Lloyd reports the pain is relieved this morning.  Derek Lloyd has been taking increased doses of Xanax and oxycodone at home.  Derek Lloyd has developed suicidal ideations recently.  Objective: Vital signs in last 24 hours: Blood pressure 102/76, pulse (!) 101, temperature 98.5 F (36.9 C), temperature source Oral, resp. rate 17, height 5' 10"  (1.778 m), weight 95 lb 0.3 oz (43.1 kg), SpO2 99 %.  Intake/Output from previous day: 02/09 0701 - 02/10 0700 In: 973 [I.V.:473; IV Piggyback:500] Out: -   Physical Exam:  HEENT: No thrush Lungs: Decreased breath sounds at the left lower chest Cardiac: Regular rate and rhythm Abdomen: Mildly distended, no mass no hepatomegaly Extremities: No leg edema Neurologic: Alert and oriented  Portacath/PICC-without erythema  Lab Results: Recent Labs    06/30/19 1853 07/01/19 0447  WBC 10.7* 12.0*  HGB 12.7* 13.3  HCT 39.6 41.9  PLT 811* 826*    BMET Recent Labs    06/30/19 1157 06/30/19 1157 06/30/19 1853 07/01/19 0447  NA 139  --   --  139  K 4.4  --   --  4.3  CL 102  --   --  103  CO2 26  --   --  23  GLUCOSE 75  --   --  85  BUN 11  --   --  16  CREATININE 0.71   < > 0.77 0.78  CALCIUM 9.0  --   --  9.0   < > = values in this interval not displayed.    Lab Results  Component Value Date   CEA1 4.80 03/23/2019    Studies/Results: CT Angio Chest PE W and/or Wo Contrast  Result Date: 06/30/2019 CLINICAL DATA:  Shortness of breath. EXAM: CT ANGIOGRAPHY CHEST WITH CONTRAST TECHNIQUE: Multidetector CT imaging of the chest was performed using the standard protocol during bolus administration  of intravenous contrast. Multiplanar CT image reconstructions and MIPs were obtained to evaluate the vascular anatomy. CONTRAST:  63m OMNIPAQUE IOHEXOL 300 MG/ML  SOLN COMPARISON:  03/30/2019 FINDINGS: Cardiovascular: There are no pulmonary emboli. Heart size is normal. The heart and other mediastinal structures are slightly shifted to the right by the large left pleural effusion. Mediastinum/Nodes: No enlarged mediastinal, hilar, or axillary lymph nodes. Thyroid gland, trachea, and esophagus demonstrate no significant findings. Lungs/Pleura: There has been a marked increase in the left pleural effusion with almost complete compressive atelectasis of left lower lobe and partial compressive atelectasis of the left upper lobe. Tiny right effusion. Small bleb in the right lower lobe, unchanged. Chronic irregular thickening of the fissures with slight pleural nodularity as described on the prior exam. This is unchanged. Upper Abdomen: Hepatomegaly. Ascites. Musculoskeletal: No chest wall abnormality. No acute or significant osseous findings. Review of the MIP images confirms the above findings. IMPRESSION: 1. No pulmonary emboli. 2. Marked increase in the large left pleural effusion with almost complete compressive atelectasis of the left lower lobe and partial compressive atelectasis of the left upper lobe. This effusion has a mass effect shifting the heart and other mediastinal structures to the right. 3. Tiny right effusion. 4. Hepatomegaly. 5. Ascites. Electronically Signed   By: JJeneen Rinks  Maxwell M.D.   On: 06/30/2019 14:57   CT ABDOMEN PELVIS W CONTRAST  Result Date: 06/30/2019 CLINICAL DATA:  49 year old with acute abdominal pain and neutropenia. History of appendiceal cancer. EXAM: CT ABDOMEN AND PELVIS WITH CONTRAST TECHNIQUE: Multidetector CT imaging of the abdomen and pelvis was performed using the standard protocol following bolus administration of intravenous contrast. CONTRAST:  108m OMNIPAQUE IOHEXOL 300  MG/ML  SOLN COMPARISON:  06/22/1999 FINDINGS: Lower chest: Again noted is large pleural effusion at the left lung base with pleural enhancement. There appears to be extensive volume loss in the left lower lobe. Findings are suggestive for a complex or loculated left pleural effusion. Again noted is irregular pleural thickening along the right major fissure and mild pleural thickening at right lung base. Hepatobiliary: Common bile duct measures roughly 7 mm and this is similar to the previous examination. Stable mild intrahepatic biliary dilatation. Main portal venous system patent. Pancreas: Unremarkable. No pancreatic ductal dilatation or surrounding inflammatory changes. Spleen: Absent Adrenals/Urinary Tract: Limited evaluation of the adrenal glands. Fullness in the renal collecting systems bilaterally and similar to the prior examination. Mild to moderate amount of fluid in the urinary bladder. Stomach/Bowel: Distal rectum is distended with gas and stool. The proximal rectum just beyond the surgical anastomosis again demonstrates diffuse wall thickening and concerning for a soft tissue lesion or neoplastic process. Area of wall thickening measures at least 4.9 cm in length and similar to the recent examination. There is a surgical anastomosis just proximal to this area of wall thickening and the colon proximal to the lesion is markedly distended and contains a large amount of stool. Findings are suggestive for a mechanical obstruction at this level. Large amount of stool throughout the left colon. There is a surgical bowel anastomosis the upper mid abdomen and findings are compatible with right hemicolectomy. Again noted are dilated loops of small bowel throughout the abdomen. No gross abnormality to the stomach. Vascular/Lymphatic: Mild atherosclerotic disease in abdominal aorta and iliac arteries without aneurysm or dissection. The main visceral arteries are patent. This is a predominantly arterial phase  examination and limited evaluation of the venous structures. Portal venous system is patent. No significant abdominopelvic lymphadenopathy. Reproductive: Limited evaluation of the prostate. Other: Minimal intra-abdominal and subcutaneous fat in the abdomen or pelvis. No evidence for free intraperitoneal air. A small amount of ascites, particularly in the anterior lower abdomen on sequence 2, image 54. Ascites appears to be similar to the recent comparison examination. Musculoskeletal: No acute bone abnormality. IMPRESSION: 1. Distal large bowel obstruction. Again noted is soft tissue thickening and irregularity involving the rectum just distal to the surgical anastomosis. Findings are concerning for neoplastic disease. Again noted is marked distension of the colon proximal to the lesion. The distended colon contains a large amount of stool. 2. Small amount of ascites, similar to the previous examination. 3. Large loculated left pleural effusion with marked volume loss in left lower lobe. There is also evidence for pleural thickening in the right lung concerning for metastatic disease. Chest findings are similar to the recent comparison examination. 4. Stable fullness of the renal collecting systems. 5. Mild intrahepatic and extrahepatic biliary dilatation is stable. Electronically Signed   By: AMarkus DaftM.D.   On: 06/30/2019 15:36    Medications: I have reviewed the patient's current medications.  Assessment/Plan:  1. Metastatic adenocarcinoma of the appendix ? 10/02/2017 biopsy of an appendiceal orifice "polyp"revealed invasive adenocarcinoma ? CT abdomen/pelvis 09/26/2017 with evidence of carcinomatosis-omental caking and ascites ?  Colonoscopy 10/02/2017 with multiple polyps-tubular adenomas ? Paracentesis 09/24/2017, 09/28/2017-no malignant cells identified ? Paracentesis 11/01/2017-malignant cells consistent with carcinoma ? Biopsy of left peritoneal mass 11/01/2017-poorly differentiated carcinoma ? CT  chest 11/01/2017-? Small pleural nodules versus atelectasis, no other evidence of metastatic disease in the chest ? Cycle 1 FOLFOX 11/04/2017 ? Cycle 2 FOLFOX 11/18/2017 ? Cycle 3 FOLFOX 12/02/2017 ? Cycle 4 FOLFOX 12/17/2017 ? Cycle 5 FOLFOX 12/30/2017 ? Cycle 6 FOLFOX 01/14/2018 ? [facility] at [facility] 01/15/2018-improved abdominal ascites. Similar peritoneal metastatic burden with more discrete measurable peritoneal disease adjacent to the splenic flexure of the colon. Findings of diffuse bowel serosal metastatic involvement. ? 02/24/2018-laparotomy with right diaphragm peritonectomy, splenectomy, right colectomy, sigmoid colectomy, hyperthermic intraperitoneal chemotherapy mitomycin-C, stent placement;R1 resection(invasive adenocarcinoma, poorly differentiated, arising from the appendix; carcinoma invades to the wall of the appendix, into the surrounding adipose tissue and directly invades into the wall of the terminal ileum. Perineural invasion present. Total of 22 benign lymph nodes. Proximal distal margins free of tumor. Metastatic adenocarcinoma to the omentum. Benign spleen. Left gutter stripping metastatic adenocarcinoma; metastatic adenocarcinoma to the peritoneum;right diaphragm stripping metastatic adenocarcinoma; debrided tumor metastatic adenocarcinoma to the peritoneum;round ligament of liver metastatic adenocarcinoma). ? CTs 12/05/2018-new bilateral pleural effusions, moderate on the right and small on the left. Extensive nodularity along the pleural fissures on both sides and also along the medial pleura on the right. No recurrent omental disease. Progressive appearing ill defined infiltrating soft tissue mass in the pelvis along the rectosigmoid junction just below the anastomosis. ? Thoracentesis 12/17/2018-reactive mesothelial cells and acute inflammation ? 12/26/2018 flexible sigmoidoscopy-tortuous and anatomically abnormal left colon without any luminal masses, tumors.  Sigmoid anastomosis noted about 20 cm from the anus associated with significant mucosal edema but not obvious tumors. ? Cycle 1 FOLFIRI 12/31/2018 ? 01/22/2019 guardant 360-no tumor related somatic alterations were detected in patient's sample ? Cycle 1 Irinotecan9/29/2020 ? Cycle 2 Irinotecan 03/09/2019 ? CTs 03/30/2019-interval development of large volume loculated fluid within the abdomen. Persistent soft tissue mass involving the rectosigmoid anastomosis concerning for residual/recurrent tumor. Not significantly changed. Increased caliber of small bowel loops with transition to distal caliber small bowel proximal to the enterocolonic anastomosis, concerning for partial small bowel obstruction. Decreased volume of right pleural effusion with increased volume of left effusion. Persistent similar perifissural nodularity within both lungs concerning for pleural spread of disease. ? CTs 06/30/2019-increased large left pleural effusion, pleural thickening in the right lung distal large bowel obstruction soft tissue thickening/irregularity involving the rectum distal to the surgical anastomosis  2.Abdominal pain and bloating secondary to the distal colonic obstruction  3.Port-A-Cath placement 11/01/2017  4.Nausea following cycle 1 FOLFOX-Emend and Decadron prophylaxis added with cycle 2  5. Hospitalization 01/02/2019 through 01/03/2019 presenting with chest pain, EKG with atrial fibrillation rate 164; Echo showed left ventricle with severely reduced systolic function with an ejection fraction of 20 to 25%. Catheterization with moderate to severe left ventricular systolic dysfunction, low left ventricular end-diastolic pressure.On Coreg. Now followed by Dr. Stanford Breed.  6.  Admission 06/30/2019 with a colonic obstruction and progressive left pleural effusion  7.  Leukocytosis/thrombocytosis secondary to the splenectomy   Derek Lloyd is admitted with a symptomatic bowel obstruction.  Derek Lloyd  also has dyspnea related to a large left pleural effusion.  Derek Lloyd has a history of metastatic appendiceal carcinoma with carcinomatosis  Derek Lloyd appears to have a distal colonic obstruction from tumor.  I discussed the case with Dr. Donne Hazel.  Derek Lloyd will most likely need a  palliative diverting colostomy.  Derek Lloyd will also benefit from a palliative left thoracentesis.  I will contact Dr. Clovis Riley for [place] to discuss the case and decide on surgery here versus at [facility].  I recommend sending pleural fluid and any available biopsy material from surgery for pathology.  We have attempted to obtain molecular testing on several previous samples.  This has been unsuccessful, largely due to the mucinous nature of the tumor with a low cell count.  Would like to obtain tissue for molecular testing to see if Derek Lloyd is eligible for any targeted therapy.  Derek Lloyd understands no therapy will be curative.  I agree with the palliative care consult.  Derek Lloyd will benefit from discussions regarding depression and suicidal ideations.            LOS: 1 day   Betsy Coder, MD   07/01/2019, 10:01 AM

## 2019-07-01 NOTE — Progress Notes (Signed)
Patient complained of increased heart rate, checked vital signs and HR was 124, MD made aware, pt also wanted his IV fluids increased, MD also made aware. MD came and spoke to the patient regarding his conditions.

## 2019-07-01 NOTE — Progress Notes (Signed)
Initial Nutrition Assessment  DOCUMENTATION CODES:   Underweight, Severe malnutrition in context of chronic illness  INTERVENTION:   -MVI with minerals daily -Boost Breeze po TID, each supplement provides 250 kcal and 9 grams of protein -RD will follow for diet advancement and adjust supplement regimen as appropriate  NUTRITION DIAGNOSIS:   Severe Malnutrition related to chronic illness(appendiceal cancer) as evidenced by percent weight loss, severe fat depletion, severe muscle depletion.  GOAL:   Patient will meet greater than or equal to 90% of their needs  MONITOR:   PO intake, Supplement acceptance, Diet advancement, Labs, Weight trends, Skin, I & O's  REASON FOR ASSESSMENT:   Consult Assessment of nutrition requirement/status  ASSESSMENT:   Derek Lloyd is a 49 y.o. male presenting with abdominal pain . PMH is significant for Appendiceal Carcinoma, STEMI secondary to chemo with EF 20-25%,  Pt admitted with abdominal pain secondary to large bowel obstruction due to stage IV apediceal carcinoma.   2/10- s/p lt thoracentesis (900 ml amber fluid removed)  Reviewed I/O's: +973 ml x 24 hours  Spoke with pt at bedside, who complains of pain. He states "I'm tired, I'm in pain, and I've had people in here all morning". When RD introduced self and rationale for visit, pt politely requested that this RD come back at another time, as he had a visitor coming soon and would like to spend time with them. RD honored pt request and offered comfort items, which pt politely declined but expressed appreciation for RD concern.   Pt visually cachetic with visible generalized severe fat and muscle depletions. Pt is followed by RD at [facility], who has identified pt with malnutrition in the past. He was receiving complimentary cases of Ensure PTA.   Reviewed wt hx; pt has experienced a 10.6% wt loss over the past 3 months, which is significant for time frame.   Per general  surgery notes, pt does not require emergent surgical intervention, but ultimately would benefit from colostomy ([facility]).   Medications reviewed and include 0.9% sodium chloride infusion@ 50 ml/hr.  Labs reviewed.   NUTRITION - FOCUSED PHYSICAL EXAM:    Most Recent Value  Orbital Region  Severe depletion  Upper Arm Region  Severe depletion  Thoracic and Lumbar Region  Severe depletion  Buccal Region  Severe depletion  Temple Region  Severe depletion  Clavicle Bone Region  Severe depletion  Clavicle and Acromion Bone Region  Severe depletion  Scapular Bone Region  Severe depletion  Dorsal Hand  Severe depletion  Patellar Region  Severe depletion  Anterior Thigh Region  Severe depletion  Posterior Calf Region  Severe depletion  Edema (RD Assessment)  None  Hair  Reviewed  Eyes  Reviewed  Mouth  Reviewed  Skin  Reviewed  Nails  Reviewed       Diet Order:   Diet Order            Diet NPO time specified  Diet effective midnight        Diet clear liquid Room service appropriate? Yes; Fluid consistency: Thin  Diet effective now              EDUCATION NEEDS:   No education needs have been identified at this time  Skin:  Skin Assessment: Reviewed RN Assessment  Last BM:  Unknown  Height:   Ht Readings from Last 1 Encounters:  06/30/19 5' 10"  (1.778 m)    Weight:   Wt Readings from Last 1  Encounters:  06/30/19 43.1 kg    Ideal Body Weight:  75.5 kg  BMI:  Body mass index is 13.63 kg/m.  Estimated Nutritional Needs:   Kcal:  1700-1900  Protein:  90-105 grams  Fluid:  > 1.7 L    Loistine Chance, RD, LDN, [place] Registered Dietitian II Certified Diabetes Care and Education Specialist Please refer to [facility] for RD and/or RD on-call/weekend/after hours pager

## 2019-07-01 NOTE — Plan of Care (Signed)
  Problem: Health Behavior/Discharge Planning: Goal: Ability to manage health-related needs will improve Outcome: Progressing   

## 2019-07-01 NOTE — Progress Notes (Signed)
[facility] Progress Note     Subjective: CC: abdominal pain Patient reports abdominal pain in L flank and more generalized throughout. Reports he had a BM this AM that was small and malodorous. Denies nausea. Wants to eat, but is very clear that he does not want a colostomy unless he has no other options. Would like for [place] to speak to the physicians at [facility] as well so that everyone is on the same page.   Review of Systems  Constitutional: Positive for malaise/fatigue.  Respiratory: Negative for shortness of breath.   Cardiovascular: Negative for chest pain.  Gastrointestinal: Positive for abdominal pain and constipation. Negative for nausea and vomiting.  Genitourinary: Negative for dysuria, frequency and urgency.  Neurological: Positive for weakness.  All other systems reviewed and are negative.    Objective: Vital signs in last 24 hours: Temp:  [98.5 F (36.9 C)-98.9 F (37.2 C)] 98.5 F (36.9 C) (02/10 0500) Pulse Rate:  [101-102] 101 (02/10 0500) Resp:  [17-31] 17 (02/10 0500) BP: (90-112)/(70-86) 102/76 (02/10 0500) SpO2:  [98 %-99 %] 99 % (02/10 0500) Last BM Date: (PTA)  Intake/Output from previous day: 02/09 0701 - 02/10 0700 In: 973 [I.V.:473; IV Piggyback:500] Out: -  Intake/Output this shift: No intake/output data recorded.  PE: General: pleasant, WD, cachectic white male who is laying in bed in NAD HEENT:  Sclera are noninjected.  PERRL.  Ears and nose without any masses or lesions.  Mouth is pink and dry Heart: regular, rate, and rhythm.  Normal s1,s2. No obvious murmurs, gallops, or rubs noted.  Palpable radial and pedal pulses bilaterally Lungs: CTAB, no wheezes, rhonchi, or rales noted.  Respiratory effort nonlabored Abd: firm, ttp in L flank with palpable mass, ND, +BS MS: all 4 extremities are symmetrical with no cyanosis, clubbing, or edema. Skin: warm and dry with no masses, lesions, or rashes Neuro: Cranial nerves 2-12 grossly intact,  speech is normal Psych: A&Ox3 with an appropriate affect.   Lab Results:  Recent Labs    06/30/19 1853 07/01/19 0447  WBC 10.7* 12.0*  HGB 12.7* 13.3  HCT 39.6 41.9  PLT 811* 826*   BMET Recent Labs    06/30/19 1157 06/30/19 1157 06/30/19 1853 07/01/19 0447  NA 139  --   --  139  K 4.4  --   --  4.3  CL 102  --   --  103  CO2 26  --   --  23  GLUCOSE 75  --   --  85  BUN 11  --   --  16  CREATININE 0.71   < > 0.77 0.78  CALCIUM 9.0  --   --  9.0   < > = values in this interval not displayed.   PT/INR No results for input(s): LABPROT, INR in the last 72 hours. CMP     Component Value Date/Time   NA 139 07/01/2019 0447   K 4.3 07/01/2019 0447   CL 103 07/01/2019 0447   CO2 23 07/01/2019 0447   GLUCOSE 85 07/01/2019 0447   BUN 16 07/01/2019 0447   CREATININE 0.78 07/01/2019 0447   CREATININE 0.55 (L) 06/25/2019 1157   CALCIUM 9.0 07/01/2019 0447   PROT 7.0 06/30/2019 1157   ALBUMIN 2.6 (L) 06/30/2019 1157   AST 25 06/30/2019 1157   AST 79 (H) 06/25/2019 1157   ALT 64 (H) 06/30/2019 1157   ALT 208 (H) 06/25/2019 1157   ALKPHOS 507 (H) 06/30/2019 1157  BILITOT 0.5 06/30/2019 1157   BILITOT 0.5 06/25/2019 1157   GFRNONAA >60 07/01/2019 0447   GFRNONAA >60 06/25/2019 1157   GFRAA >60 07/01/2019 0447   GFRAA >60 06/25/2019 1157   Lipase     Component Value Date/Time   LIPASE 19 06/23/2019 1156       Studies/Results: CT Angio Chest PE W and/or Wo Contrast  Result Date: 06/30/2019 CLINICAL DATA:  Shortness of breath. EXAM: CT ANGIOGRAPHY CHEST WITH CONTRAST TECHNIQUE: Multidetector CT imaging of the chest was performed using the standard protocol during bolus administration of intravenous contrast. Multiplanar CT image reconstructions and MIPs were obtained to evaluate the vascular anatomy. CONTRAST:  37m OMNIPAQUE IOHEXOL 300 MG/ML  SOLN COMPARISON:  03/30/2019 FINDINGS: Cardiovascular: There are no pulmonary emboli. Heart size is normal. The heart and  other mediastinal structures are slightly shifted to the right by the large left pleural effusion. Mediastinum/Nodes: No enlarged mediastinal, hilar, or axillary lymph nodes. Thyroid gland, trachea, and esophagus demonstrate no significant findings. Lungs/Pleura: There has been a marked increase in the left pleural effusion with almost complete compressive atelectasis of left lower lobe and partial compressive atelectasis of the left upper lobe. Tiny right effusion. Small bleb in the right lower lobe, unchanged. Chronic irregular thickening of the fissures with slight pleural nodularity as described on the prior exam. This is unchanged. Upper Abdomen: Hepatomegaly. Ascites. Musculoskeletal: No chest wall abnormality. No acute or significant osseous findings. Review of the MIP images confirms the above findings. IMPRESSION: 1. No pulmonary emboli. 2. Marked increase in the large left pleural effusion with almost complete compressive atelectasis of the left lower lobe and partial compressive atelectasis of the left upper lobe. This effusion has a mass effect shifting the heart and other mediastinal structures to the right. 3. Tiny right effusion. 4. Hepatomegaly. 5. Ascites. Electronically Signed   By: JLorriane ShireM.D.   On: 06/30/2019 14:57   CT ABDOMEN PELVIS W CONTRAST  Result Date: 06/30/2019 CLINICAL DATA:  49year old with acute abdominal pain and neutropenia. History of appendiceal cancer. EXAM: CT ABDOMEN AND PELVIS WITH CONTRAST TECHNIQUE: Multidetector CT imaging of the abdomen and pelvis was performed using the standard protocol following bolus administration of intravenous contrast. CONTRAST:  960mOMNIPAQUE IOHEXOL 300 MG/ML  SOLN COMPARISON:  06/22/1999 FINDINGS: Lower chest: Again noted is large pleural effusion at the left lung base with pleural enhancement. There appears to be extensive volume loss in the left lower lobe. Findings are suggestive for a complex or loculated left pleural effusion.  Again noted is irregular pleural thickening along the right major fissure and mild pleural thickening at right lung base. Hepatobiliary: Common bile duct measures roughly 7 mm and this is similar to the previous examination. Stable mild intrahepatic biliary dilatation. Main portal venous system patent. Pancreas: Unremarkable. No pancreatic ductal dilatation or surrounding inflammatory changes. Spleen: Absent Adrenals/Urinary Tract: Limited evaluation of the adrenal glands. Fullness in the renal collecting systems bilaterally and similar to the prior examination. Mild to moderate amount of fluid in the urinary bladder. Stomach/Bowel: Distal rectum is distended with gas and stool. The proximal rectum just beyond the surgical anastomosis again demonstrates diffuse wall thickening and concerning for a soft tissue lesion or neoplastic process. Area of wall thickening measures at least 4.9 cm in length and similar to the recent examination. There is a surgical anastomosis just proximal to this area of wall thickening and the colon proximal to the lesion is markedly distended and contains a large amount of  stool. Findings are suggestive for a mechanical obstruction at this level. Large amount of stool throughout the left colon. There is a surgical bowel anastomosis the upper mid abdomen and findings are compatible with right hemicolectomy. Again noted are dilated loops of small bowel throughout the abdomen. No gross abnormality to the stomach. Vascular/Lymphatic: Mild atherosclerotic disease in abdominal aorta and iliac arteries without aneurysm or dissection. The main visceral arteries are patent. This is a predominantly arterial phase examination and limited evaluation of the venous structures. Portal venous system is patent. No significant abdominopelvic lymphadenopathy. Reproductive: Limited evaluation of the prostate. Other: Minimal intra-abdominal and subcutaneous fat in the abdomen or pelvis. No evidence for free  intraperitoneal air. A small amount of ascites, particularly in the anterior lower abdomen on sequence 2, image 54. Ascites appears to be similar to the recent comparison examination. Musculoskeletal: No acute bone abnormality. IMPRESSION: 1. Distal large bowel obstruction. Again noted is soft tissue thickening and irregularity involving the rectum just distal to the surgical anastomosis. Findings are concerning for neoplastic disease. Again noted is marked distension of the colon proximal to the lesion. The distended colon contains a large amount of stool. 2. Small amount of ascites, similar to the previous examination. 3. Large loculated left pleural effusion with marked volume loss in left lower lobe. There is also evidence for pleural thickening in the right lung concerning for metastatic disease. Chest findings are similar to the recent comparison examination. 4. Stable fullness of the renal collecting systems. 5. Mild intrahepatic and extrahepatic biliary dilatation is stable. Electronically Signed   By: Markus Daft M.D.   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  Unfortunately, we have discussed with GI and they feel he is not a candidate for a stent to be placed to relieve this obstruction.   - patient adamant that he does not want a colostomy unless he has no other options - discussed with oncology and palliative care, trying to contact surgeon following him at [facility] - patient does not require emergent surgical intervention at this point, but we will continue following  - Ultimately a colostomy  is probably patient's best option but that could be done here or with surgeon at [facility] depending on patient preference    FEN - NPO VTE - per primary service, ok from surgery standpoint ID - none needed from our standpoint  LOS: 1 day    Brigid Re , [facility] 07/01/2019, 11:46 AM Please see Amion for pager number during day hours 7:00am-4:30pm

## 2019-07-01 NOTE — Consult Note (Signed)
Consultation Note Date: 07/01/2019   Patient Name: Derek Lloyd  DOB: 03-11-1971  MRN: [id number]  Age / Sex: 49 y.o., male  PCP: Ladell Pier, MD Referring Physician: Dickie La, MD  Reason for Consultation: Establishing goals of care  HPI/Patient Profile: 49 y.o. male  with past medical history of stage 4 appendiceal carcinoma and STEMI in July 2020 secondary to chemo with EF 20-25% admitted on 06/30/2019 with large bowel obstruction secondary to metastatic disease. Also with large left pleural effusion. Plan is for surgical intervention with a diverting loop transverse colostomy. PMT consulted for goals of care.    Clinical Assessment and Goals of Care: I have reviewed medical records including EPIC notes, labs and imaging, received report from RN and surgery, assessed the patient and then met with the patient  to discuss diagnosis prognosis, GOC, EOL wishes, disposition and options.  I introduced Palliative Medicine as specialized medical care for people living with serious illness. It focuses on providing relief from the symptoms and stress of a serious illness. The goal is to improve quality of life for both the patient and the family.  We discussed a brief life review of the patient. Derek Lloyd was in the [employer]. He talks a lot about his Slaughter.   As far as functional and nutritional status, Derek Lloyd speaks of a decline. He tells me he could independently care for himself but his function was declining d/t pain and bowel irregularities. He also speaks of very poor nutrition - he had a good appetite and felt hungry but could not tolerate intake. Derek Lloyd' BMI is 13.6.   We discussed his current illness and what it means in the larger context of his on-going co-morbidities. He does feel like he has a good understanding of his situation - he is very thankful for Dr. Benay Spice and feels that he has been well informed by him. He  also feels like the surgeons have given him the information he needs.   I attempted to elicit values and goals of care important to the patient.  Derek Lloyd is very focused on prolonging his life - he is interested in any interventions offered that will prolong his life.   The difference between aggressive medical intervention and comfort care was considered in light of the patient's goals of care. At this time, Derek Lloyd is not interested in comfort care. He would like to continue with aggressive medical interventions including surgery.  Advance directives, concepts specific to code status, artifical feeding and hydration, and rehospitalization were considered and discussed. We discussed code status at length and Derek Lloyd was very thoughtful when considering his options - ultimately, he tells me he would like resuscitation attempts and would be okay with short-term ventilation. He would never want trach/prolonged mechanical ventilation.  Derek Lloyd tells me about his social supports - 2 friends Derek Lloyd who he describes as a Social worker and Derek Lloyd who is a Company secretary in [place]. If Derek. Lloyd were unable to make his own medical decision he would like Derek Lloyd and Derek Lloyd to be involved in his decision making.   Hospice and Palliative Care services outpatient were explained and offered. At this time, Derek. Lloyd is not interested in hospice services. He would be appropriate for palliative follow up outpatient.   Throughout our conversation Derek Lloyd expresses how overwhelmed he feels with his illness and is often tearful. Therapeutic listening and emotional support provided.  Questions and concerns were addressed. The family was encouraged to  call with questions or concerns.   Primary Decision Maker PATIENT  if he were unable to make medical decisions he would want his friends Derek Lloyd [phone]) and Derek Lloyd [phone]) to make decisions for him  SUMMARY OF RECOMMENDATIONS    - Derek Lloyd is interested in any aggressive medical interventions offered to prolong his life - full code - would never want trach/prolonged mechanical ventilation - alternate medical decision makers named above - will ask chaplain to complete HCPOA documentation - no interest in hospice at this time, would be appropriate for palliative follow up outpatient - will f/u tomorrow  Code Status/Advance Care Planning:  Full code   Symptom Management:   Per primary - he tells me pain is adequately controlled with current regimen - no N/V  Additional Recommendations (Limitations, Scope, Preferences):  No Tracheostomy  Psycho-social/Spiritual:   Desire for further Chaplaincy support:yes - HCPOA documentation  Prognosis:   Unable to determine  Discharge Planning: To Be Determined - CIR?     Primary Diagnoses: Present on Admission: . SBO (small bowel obstruction) ([place])   I have reviewed the medical record, interviewed the patient and family, and examined the patient. The following aspects are pertinent.  Past Medical History:  Diagnosis Date  . Abdominal pain 09/2017  . Anxiety attack   . apendix ca dx'd 09/2018   with intra abd mets  . Headache    "frequently" (08/14/2017)  . Heart murmur    "when I was a kid" (08/14/2017)  . Small bowel obstruction ([place]) 08/13/2017   Social History   Socioeconomic History  . Marital status: Single    Spouse name: Not on file  . Number of children: Not on file  . Years of education: Not on file  . Highest education level: Not on file  Occupational History  . Not on file  Tobacco Use  . Smoking status: Current Some Day Smoker    Packs/day: 1.00    Types: Cigarettes    Last attempt to quit: 09/28/2016    Years since quitting: 2.7  . Smokeless tobacco: Never Used  . Tobacco comment: 08/14/2017 "I quit; can't tell you when I quit or when I started"  Substance and Sexual Activity  . Alcohol use: Yes    Comment: 08/14/2017 "I drink  when I feel like it; nothing recently"  . Drug use: Not Currently    Types: Marijuana  . Sexual activity: Not on file  Other Topics Concern  . Not on file  Social History Narrative  . Not on file   Social Determinants of Health   Financial Resource Strain:   . Difficulty of Paying Living Expenses: Not on file  Food Insecurity:   . Worried About Charity fundraiser in the Last Year: Not on file  . Ran Out of Food in the Last Year: Not on file  Transportation Needs:   . Lack of Transportation (Medical): Not on file  . Lack of Transportation (Non-Medical): Not on file  Physical Activity:   . Days of Exercise per Week: Not on file  . Minutes of Exercise per Session: Not on file  Stress:   . Feeling of Stress : Not on file  Social Connections:   . Frequency of Communication with Friends and Family: Not on file  . Frequency of Social Gatherings with Friends and Family: Not on file  . Attends Religious Services: Not on file  . Active Member of Clubs or Organizations: Not on file  . Attends  Club or Organization Meetings: Not on file  . Marital Status: Not on file   Family History  Problem Relation Age of Onset  . Prostate cancer Paternal Grandfather   . Crohn's disease Neg Hx    Scheduled Meds: . Chlorhexidine Gluconate Cloth  6 each Topical Daily   Continuous Infusions: . sodium chloride 50 mL/hr at 07/01/19 0600   PRN Meds:.HYDROmorphone (DILAUDID) injection, morphine injection, ondansetron (ZOFRAN) IV, sodium chloride flush Allergies  Allergen Reactions  . Piperacillin-Tazobactam In Dex Rash    Noted on May 2019 admission.  Tolerates Cephalosporins.   Review of Systems  Constitutional: Positive for activity change and appetite change.  Respiratory: Positive for shortness of breath.   Cardiovascular: Negative for leg swelling.  Gastrointestinal: Positive for abdominal pain. Negative for nausea and vomiting.  Psychiatric/Behavioral: Positive for agitation. The patient  is nervous/anxious.     Physical Exam Constitutional:      General: He is not in acute distress. HENT:     Head: Normocephalic and atraumatic.  Cardiovascular:     Rate and Rhythm: Tachycardia present.  Pulmonary:     Effort: Pulmonary effort is normal.  Abdominal:     General: Abdomen is flat.     Tenderness: There is abdominal tenderness.  Skin:    General: Skin is warm and dry.  Neurological:     Mental Status: He is alert and oriented to person, place, and time.  Psychiatric:        Attention and Perception: Attention normal.        Mood and Affect: Mood is anxious. Affect is tearful.        Speech: Speech normal.        Cognition and Memory: Cognition and memory normal.     Comments: Slightly agitated with situation, conversant and pleasant overall     Vital Signs: BP 102/76 (BP Location: Left Arm)   Pulse (!) 101   Temp 98.5 F (36.9 C) (Oral)   Resp 17   Ht 5' 10"  (1.778 m)   Wt 43.1 kg   SpO2 99%   BMI 13.63 kg/m  Pain Scale: 0-10   Pain Score: 10-Worst pain ever   SpO2: SpO2: 99 % O2 Device:SpO2: 99 % O2 Flow Rate: .   IO: Intake/output summary:   Intake/Output Summary (Last 24 hours) at 07/01/2019 1057 Last data filed at 07/01/2019 0600 Gross per 24 hour  Intake 973.03 ml  Output --  Net 973.03 ml    LBM: Last BM Date: (PTA) Baseline Weight: Weight: 43.1 kg Most recent weight: Weight: 43.1 kg     Palliative Assessment/Data: PPS 20% d/t PO intake    Time In: 0930 Time Out: 1115 Time Total: 105 minutes Greater than 50%  of this time was spent counseling and coordinating care related to the above assessment and plan.  Juel Burrow, DNP, AGNP-C Palliative Medicine Team [phone] Pager: [phone]

## 2019-07-01 NOTE — Evaluation (Signed)
Occupational Therapy Evaluation Patient Details Name: Derek Lloyd MRN: [id number] DOB: 11-09-1970 Today's Date: 07/01/2019    History of Present Illness Derek Lloyd is a 49 y.o. male presenting with abdominal pain. Work up showing large bowel obstruction due to stage 4 appendiceal carcinoma. PMH is significant for Appendiceal Carcinoma, STEMI secondary to chemo with EF 20-25%   Clinical Impression   PTA pt living alone in a hotel, reports increasing difficulty with BADL/IADL as he has been getting weaker. Pt presents to session very anxious, labile, and agitated. He does well with redirection, but is easily worked back up. Pt able to complete bed mobility at mod I level and transfers at supervision level. Pt completed functional mobility around hospital unit without physical assist. He did stop x2, take off mask, lean up against wall and appear to be gasping for air (VSS). He states he is claustrophobic and needs to stop and breathe occasionally. Upon returning back to room, pt began to get agitated and state how it is not fair he cannot have anything PO and he can smell other patient's food, etc. Pt impulsively took cup (that was already in room, assuming ice chips?), lunged to sink to fill cup and drank water stating "I know my body better than anyone and I never follow my restrictive diets anyways". RN and PA both informed of pt PO during session. He currently will require no post acute OT follow up, but would benefit from [facility] aide for IADL support due to decreasing energy conservation. He also states taht housekeeping is now limited at the motel in which he lives 2/2 covid and he has minimal ability and access to the needs to keep apartment clean. Will continue to follow per POC listed below.    Follow Up Recommendations  No OT follow up;Supervision - Intermittent;Other (comment)(pt would benefit from [facility] aide for IADL activity support)    Equipment Recommendations  3 in 1 bedside commode     Recommendations for Other Services       Precautions / Restrictions Precautions Precautions: None Restrictions Weight Bearing Restrictions: No      Mobility Bed Mobility Overal bed mobility: Modified Independent                Transfers Overall transfer level: Needs assistance   Transfers: Sit to/from Stand Sit to Stand: Supervision         General transfer comment: supervision for impulsiveness    Balance Overall balance assessment: No apparent balance deficits (not formally assessed)                                         ADL either performed or assessed with clinical judgement   ADL Overall ADL's : Needs assistance/impaired Eating/Feeding: NPO   Grooming: Set up;Standing   Upper Body Bathing: Set up;Sitting   Lower Body Bathing: Min guard;Sit to/from stand   Upper Body Dressing : Set up;Sitting   Lower Body Dressing: Min guard;Sit to/from stand;Sitting/lateral leans   Toilet Transfer: Economist and Hygiene: Set up;Sit to/from stand   Tub/ Banker: Civil engineer, contracting;Ambulation;Min guard   Functional mobility during ADLs: Supervision/safety General ADL Comments: pt overall generally weak but mostly limtied by cognitive deficits     Vision Baseline Vision/History: No visual deficits Patient Visual Report: No change from baseline       Perception  Praxis      Pertinent Vitals/Pain Pain Assessment: Faces Faces Pain Scale: Hurts little more Pain Location: abdomen Pain Descriptors / Indicators: Aching;Sore Pain Intervention(s): Limited activity within patient's tolerance;Monitored during session;Repositioned     Hand Dominance     Extremity/Trunk Assessment Upper Extremity Assessment Upper Extremity Assessment: Generalized weakness   Lower Extremity Assessment Lower Extremity Assessment: Generalized weakness       Communication Communication Communication:  No difficulties   Cognition Arousal/Alertness: Awake/alert Behavior During Therapy: Restless;Agitated;Anxious;Impulsive Overall Cognitive Status: No family/caregiver present to determine baseline cognitive functioning Area of Impairment: Safety/judgement;Awareness;Attention;Memory                   Current Attention Level: Sustained Memory: Decreased short-term memory   Safety/Judgement: Decreased awareness of safety;Decreased awareness of deficits Awareness: Emergent   General Comments: pt presenting labile, anxious and with poor attention due to internal fixations. He shows poor STM, emotional regulation, executive functioning, and safety. Pt impulsively drinking water despite OTs education and pt awareness of NPO status (RN and PA notified)   General Comments       Exercises     Shoulder Instructions      Home Living Family/patient expects to be discharged to:: Other (Comment)(motel room) Living Arrangements: Alone Available Help at Discharge: Other (Comment)(says no one) Type of Home: Other(Comment)(motel room) Home Access: Level entry     Home Layout: One level     Bathroom Shower/Tub: Teacher, early years/pre: Standard     Home Equipment: None   Additional Comments: 3 falls in last couple months      Prior Functioning/Environment Level of Independence: Independent        Comments: increasing difficulty with BADLs        OT Problem List: Decreased strength;Decreased knowledge of use of DME or AE;Decreased activity tolerance;Decreased cognition;Decreased safety awareness      OT Treatment/Interventions: Self-care/ADL training;Therapeutic exercise;Patient/family education;Balance training;Energy conservation;Therapeutic activities;DME and/or AE instruction;Cognitive remediation/compensation    OT Goals(Current goals can be found in the care plan section) Acute Rehab OT Goals Patient Stated Goal: to be able to eat OT Goal Formulation:  With patient Time For Goal Achievement: 07/15/19 Potential to Achieve Goals: Good  OT Frequency: Min 2X/week   Barriers to D/C:            Co-evaluation              AM-PAC OT "6 Clicks" Daily Activity     Outcome Measure Help from another person eating meals?: Total(NPO) Help from another person taking care of personal grooming?: None Help from another person toileting, which includes using toliet, bedpan, or urinal?: None Help from another person bathing (including washing, rinsing, drying)?: A Little Help from another person to put on and taking off regular upper body clothing?: None Help from another person to put on and taking off regular lower body clothing?: None 6 Click Score: 20   End of Session Equipment Utilized During Treatment: Gait belt Nurse Communication: Mobility status;Other (comment)(that pt impulsively drank water)  Activity Tolerance: Treatment limited secondary to agitation Patient left: in bed;with call bell/phone within reach;with bed alarm set  OT Visit Diagnosis: Muscle weakness (generalized) (M62.81);Other symptoms and signs involving cognitive function                Time: 0865-7846 OT Time Calculation (min): 45 min Charges:  OT General Charges $OT Visit: 1 Visit OT Evaluation $OT Eval Moderate Complexity: 1 Mod OT Treatments $Self Care/Home Management :  23-37 mins  Zenovia Jarred, MSOT, OTR/L Acute Rehabilitation Services [facility] Office Number: [phone]  Zenovia Jarred 07/01/2019, 2:14 PM

## 2019-07-01 NOTE — Progress Notes (Signed)
PT Cancellation Note  Patient Details Name: Derek Lloyd MRN: [id number] DOB: [birth year]   Cancelled Treatment:    Reason Eval/Treat Not Completed: Patient at procedure or test/unavailable   Pt was at thoracentesis earlier, and when PT followed up in afternoon pt declined PT stating "I've been through hell, they've drained my lung, everyone has been in and out, I've done my physical therapy today."  Will follow up as able.  Maggie Font, PT Acute Rehab Services Pager [phone] [facility] [place] 07/01/2019, 3:58 PM

## 2019-07-01 NOTE — Progress Notes (Signed)
Family Medicine Teaching Service Daily Progress Note Intern Pager: [phone]  Patient name: Derek Lloyd Medical record number: [id number] Date of birth: January 13, 1971 Age: 49 y.o. Gender: male  Primary Care Provider: Ladell Pier, MD Consultants: Surgery, IR, Oncology Code Status: Full  Pt Overview and Major Events to Date:  02/09-admitted  Assessment and Plan: Derek Lloyd is a 49 y.o. male presenting with abdominal pain . PMH is significant for Appendiceal Carcinoma, STEMI secondary to chemo with EF 20-25%,   Abdominal Pain secondary Large Bowel Obstruction due to Stage 4 appendiceal carcinoma  Vital signs stable overnight.  Continues to have abdominal pain but managed with current pain regimen.  Reports small bowel movement today.  Abdominal exam remains unchanged.  Abdomen hard and firm with hyperactive bowel sounds. -Surgery following -N.p.o. -Continue Dilaudid 1 mg every 2 as needed -Continue morphine 3 mg every 4 as needed  -Zofran 4 mg IV every 4 as needed -Follow-up palliative consult -Follow-up nutrition consult -SCDs  -PT/OT   SOB without hypoxia likely secondary to Large left pleural effusion Stable overnight.  Oxygen saturation 99% on room air.  Patient states he only has shortness of breath when he gets agitated.  On exam he is able to speak in complete sentences, no increased work of breathing. -IR consulted for possible thoracentesis today -NPO  -Maintain oxygen saturation greater than 90% -Monitor respiratory status  Appendiceal Cancer Follow[facility] at [facility].  Oncologist Dr. Ammie Dalton.  Patient has Right upper Port-a-Cath for chemo which he received his last dose in October.   -Palliative consult  Thrombocytosis likely secondary to malignancy Platelets 790> 811> 826 -Continue to monitor   FEN/GI: NPO, IVF Prophylaxis: SCD's   Disposition: Home medically stable  Subjective:  No acute events overnight.  Pain well controlled.  Patient  requesting GI consult for stents.  Patient believes that GI would be the better route to go versus surgical intervention.  Objective: Temp:  [97.7 F (36.5 C)-98.9 F (37.2 C)] 98.5 F (36.9 C) (02/10 0500) Pulse Rate:  [101-113] 101 (02/10 0500) Resp:  [17-31] 17 (02/10 0500) BP: (90-112)/(69-86) 102/76 (02/10 0500) SpO2:  [97 %-99 %] 99 % (02/10 0500) Weight:  [43.1 kg] 43.1 kg (02/09 1032) Physical Exam: General: 49 year old male ill-appearing, cachectic and in no acute distress Cardiovascular: Regular rate and rhythm, no gallops or murmurs appreciated Respiratory: Clear to auscultation bilaterally, diminished at bases.  No crackles or wheezing noted, no increased work of breathing Abdomen: Firm and distended, hyperactive bowel sounds, pain elicited on light palpation Extremities: No lower extremity edema noted  Laboratory: Recent Labs  Lab 06/30/19 1157 06/30/19 1853 07/01/19 0447  WBC 11.6* 10.7* 12.0*  HGB 13.7 12.7* 13.3  HCT 42.9 39.6 41.9  PLT 790* 811* 826*   Recent Labs  Lab 06/25/19 1157 06/30/19 1157 06/30/19 1853 07/01/19 0447  NA 138 139  --  139  K 3.7 4.4  --  4.3  CL 101 102  --  103  CO2 28 26  --  23  BUN 14 11  --  16  CREATININE 0.55* 0.71 0.77 0.78  CALCIUM 8.6* 9.0  --  9.0  PROT 7.0 7.0  --   --   BILITOT 0.5 0.5  --   --   ALKPHOS 689* 507*  --   --   ALT 208* 64*  --   --   AST 79* 25  --   --   GLUCOSE 101* 75  --  85  Imaging/Diagnostic Tests: CT Angio Chest PE W and/or Wo Contrast  Result Date: 06/30/2019 CLINICAL DATA:  Shortness of breath. EXAM: CT ANGIOGRAPHY CHEST WITH CONTRAST TECHNIQUE: Multidetector CT imaging of the chest was performed using the standard protocol during bolus administration of intravenous contrast. Multiplanar CT image reconstructions and MIPs were obtained to evaluate the vascular anatomy. CONTRAST:  24m OMNIPAQUE IOHEXOL 300 MG/ML  SOLN COMPARISON:  03/30/2019 FINDINGS: Cardiovascular: There are no  pulmonary emboli. Heart size is normal. The heart and other mediastinal structures are slightly shifted to the right by the large left pleural effusion. Mediastinum/Nodes: No enlarged mediastinal, hilar, or axillary lymph nodes. Thyroid gland, trachea, and esophagus demonstrate no significant findings. Lungs/Pleura: There has been a marked increase in the left pleural effusion with almost complete compressive atelectasis of left lower lobe and partial compressive atelectasis of the left upper lobe. Tiny right effusion. Small bleb in the right lower lobe, unchanged. Chronic irregular thickening of the fissures with slight pleural nodularity as described on the prior exam. This is unchanged. Upper Abdomen: Hepatomegaly. Ascites. Musculoskeletal: No chest wall abnormality. No acute or significant osseous findings. Review of the MIP images confirms the above findings. IMPRESSION: 1. No pulmonary emboli. 2. Marked increase in the large left pleural effusion with almost complete compressive atelectasis of the left lower lobe and partial compressive atelectasis of the left upper lobe. This effusion has a mass effect shifting the heart and other mediastinal structures to the right. 3. Tiny right effusion. 4. Hepatomegaly. 5. Ascites. Electronically Signed   By: JLorriane ShireM.D.   On: 06/30/2019 14:57   CT ABDOMEN PELVIS W CONTRAST  Result Date: 06/30/2019 CLINICAL DATA:  49year old with acute abdominal pain and neutropenia. History of appendiceal cancer. EXAM: CT ABDOMEN AND PELVIS WITH CONTRAST TECHNIQUE: Multidetector CT imaging of the abdomen and pelvis was performed using the standard protocol following bolus administration of intravenous contrast. CONTRAST:  929mOMNIPAQUE IOHEXOL 300 MG/ML  SOLN COMPARISON:  06/22/1999 FINDINGS: Lower chest: Again noted is large pleural effusion at the left lung base with pleural enhancement. There appears to be extensive volume loss in the left lower lobe. Findings are  suggestive for a complex or loculated left pleural effusion. Again noted is irregular pleural thickening along the right major fissure and mild pleural thickening at right lung base. Hepatobiliary: Common bile duct measures roughly 7 mm and this is similar to the previous examination. Stable mild intrahepatic biliary dilatation. Main portal venous system patent. Pancreas: Unremarkable. No pancreatic ductal dilatation or surrounding inflammatory changes. Spleen: Absent Adrenals/Urinary Tract: Limited evaluation of the adrenal glands. Fullness in the renal collecting systems bilaterally and similar to the prior examination. Mild to moderate amount of fluid in the urinary bladder. Stomach/Bowel: Distal rectum is distended with gas and stool. The proximal rectum just beyond the surgical anastomosis again demonstrates diffuse wall thickening and concerning for a soft tissue lesion or neoplastic process. Area of wall thickening measures at least 4.9 cm in length and similar to the recent examination. There is a surgical anastomosis just proximal to this area of wall thickening and the colon proximal to the lesion is markedly distended and contains a large amount of stool. Findings are suggestive for a mechanical obstruction at this level. Large amount of stool throughout the left colon. There is a surgical bowel anastomosis the upper mid abdomen and findings are compatible with right hemicolectomy. Again noted are dilated loops of small bowel throughout the abdomen. No gross abnormality to the stomach. Vascular/Lymphatic: Mild  atherosclerotic disease in abdominal aorta and iliac arteries without aneurysm or dissection. The main visceral arteries are patent. This is a predominantly arterial phase examination and limited evaluation of the venous structures. Portal venous system is patent. No significant abdominopelvic lymphadenopathy. Reproductive: Limited evaluation of the prostate. Other: Minimal intra-abdominal and  subcutaneous fat in the abdomen or pelvis. No evidence for free intraperitoneal air. A small amount of ascites, particularly in the anterior lower abdomen on sequence 2, image 54. Ascites appears to be similar to the recent comparison examination. Musculoskeletal: No acute bone abnormality. IMPRESSION: 1. Distal large bowel obstruction. Again noted is soft tissue thickening and irregularity involving the rectum just distal to the surgical anastomosis. Findings are concerning for neoplastic disease. Again noted is marked distension of the colon proximal to the lesion. The distended colon contains a large amount of stool. 2. Small amount of ascites, similar to the previous examination. 3. Large loculated left pleural effusion with marked volume loss in left lower lobe. There is also evidence for pleural thickening in the right lung concerning for metastatic disease. Chest findings are similar to the recent comparison examination. 4. Stable fullness of the renal collecting systems. 5. Mild intrahepatic and extrahepatic biliary dilatation is stable. Electronically Signed   By: Markus Daft M.D.   On: 06/30/2019 15:36    Carollee Leitz, MD 07/01/2019, 6:06 AM PGY-1, [place] Intern pager: [phone], text pages welcome

## 2019-07-02 DIAGNOSIS — Z9081 Acquired absence of spleen: Secondary | ICD-10-CM

## 2019-07-02 DIAGNOSIS — D72829 Elevated white blood cell count, unspecified: Secondary | ICD-10-CM

## 2019-07-02 DIAGNOSIS — R0600 Dyspnea, unspecified: Secondary | ICD-10-CM

## 2019-07-02 DIAGNOSIS — E43 Unspecified severe protein-calorie malnutrition: Secondary | ICD-10-CM | POA: Insufficient documentation

## 2019-07-02 LAB — CBC
HCT: 39.8 % (ref 39.0–52.0)
Hemoglobin: 12.4 g/dL — ABNORMAL LOW (ref 13.0–17.0)
MCH: 29.7 pg (ref 26.0–34.0)
MCHC: 31.2 g/dL (ref 30.0–36.0)
MCV: 95.2 fL (ref 80.0–100.0)
Platelets: 763 10*3/uL — ABNORMAL HIGH (ref 150–400)
RBC: 4.18 MIL/uL — ABNORMAL LOW (ref 4.22–5.81)
RDW: 15.3 % (ref 11.5–15.5)
WBC: 12.5 10*3/uL — ABNORMAL HIGH (ref 4.0–10.5)
nRBC: 0 % (ref 0.0–0.2)

## 2019-07-02 LAB — BASIC METABOLIC PANEL
Anion gap: 15 (ref 5–15)
BUN: 23 mg/dL — ABNORMAL HIGH (ref 6–20)
CO2: 24 mmol/L (ref 22–32)
Calcium: 8.9 mg/dL (ref 8.9–10.3)
Chloride: 101 mmol/L (ref 98–111)
Creatinine, Ser: 0.73 mg/dL (ref 0.61–1.24)
GFR calc Af Amer: >60 mL/min (ref >60)
GFR calc non Af Amer: >60 mL/min (ref >60)
Glucose, Bld: 140 mg/dL — ABNORMAL HIGH (ref 70–99)
Potassium: 4.2 mmol/L (ref 3.5–5.1)
Sodium: 140 mmol/L (ref 135–145)

## 2019-07-02 LAB — CYTOLOGY - NON PAP

## 2019-07-02 MED ORDER — ACETAMINOPHEN 325 MG PO TABS
650.0000 mg | ORAL_TABLET | Freq: Four times a day (QID) | ORAL | Status: DC
  Administered 2019-07-02: 13:00:00 650 mg via ORAL
  Filled 2019-07-02 (×2): qty 2

## 2019-07-02 MED ORDER — ACETAMINOPHEN 325 MG PO TABS: 650.0000 mg | ORAL_TABLET | Freq: Four times a day (QID) | ORAL | Status: DC | PRN

## 2019-07-02 MED ORDER — HYDROMORPHONE HCL 1 MG/ML IJ SOLN
1.0000 mg | INTRAMUSCULAR | Status: DC | PRN
  Administered 2019-07-02: 1 mg via INTRAVENOUS
  Filled 2019-07-02: qty 1

## 2019-07-02 MED ORDER — ENSURE ENLIVE PO LIQD: 237.0000 mL | Freq: Three times a day (TID) | ORAL | Status: DC

## 2019-07-02 MED ORDER — ENOXAPARIN SODIUM 300 MG/3ML IJ SOLN
20.0000 mg | INTRAMUSCULAR | Status: DC
  Administered 2019-07-02: 20 mg via SUBCUTANEOUS
  Filled 2019-07-02 (×2): qty 0.2

## 2019-07-02 MED ORDER — MORPHINE SULFATE (PF) 4 MG/ML IV SOLN
3.0000 mg | INTRAVENOUS | Status: DC | PRN
  Administered 2019-07-02 (×4): 3 mg via INTRAVENOUS
  Filled 2019-07-02 (×4): qty 1

## 2019-07-02 NOTE — Progress Notes (Signed)
Report called to [facility] given to [place], [place]. Transportation scheduled by [facility]. Patient is on waiting list. Will dispatch as soon as the next transport becomes available. Patient ready for discharge.

## 2019-07-02 NOTE — Progress Notes (Signed)
[facility] Progress Note     Subjective: CC: abdominal pain Patient reports he did have a BM yesterday. Tolerating CLD. He is much more accepting of the idea of a colostomy today with the understanding that it is likely his only option to be able to eat comfortably. He tells me that he would prefer to have this done by Dr. Clovis Riley if able since he is already familiar to him, but that he would be agreeable to have surgery here if needed.  Review of Systems  Constitutional: Positive for malaise/fatigue.  Gastrointestinal: Positive for abdominal pain. Negative for nausea and vomiting.  Genitourinary: Negative for dysuria, frequency and urgency.  All other systems reviewed and are negative.    Objective: Vital signs in last 24 hours: Temp:  [97.6 F (36.4 C)-98.7 F (37.1 C)] 98.7 F (37.1 C) (02/11 0413) Pulse Rate:  [100-110] 100 (02/11 0413) Resp:  [16-24] 18 (02/11 0413) BP: (100-114)/(74-86) 100/74 (02/11 0413) SpO2:  [97 %-100 %] 97 % (02/11 0413) Weight:  [42.3 kg] 42.3 kg (02/10 2139) Last BM Date: (PTA)  Intake/Output from previous day: 02/10 0701 - 02/11 0700 In: 1320 [P.O.:120; I.V.:1200] Out: 200 [Urine:200] Intake/Output this shift: No intake/output data recorded.  PE: General: pleasant, WD, cachectic white male who is laying in bed in NAD HEENT:  Sclera are noninjected.  PERRL.  Ears and nose without any masses or lesions.  Mouth is pink and dry Heart: regular, rate, and rhythm.  Normal s1,s2. No obvious murmurs, gallops, or rubs noted.  Palpable radial and pedal pulses bilaterally Lungs: CTAB, no wheezes, rhonchi, or rales noted.  Respiratory effort nonlabored Abd: firm, ttp in L flank with palpable mass, ND, +BS MS: all 4 extremities are symmetrical with no cyanosis, clubbing, or edema. Skin: warm and dry with no masses, lesions, or rashes Neuro: Cranial nerves 2-12 grossly intact, speech is normal Psych: A&Ox3 with an appropriate affect.   Lab  Results:  Recent Labs    07/01/19 0447 07/02/19 0431  WBC 12.0* 12.5*  HGB 13.3 12.4*  HCT 41.9 39.8  PLT 826* 763*   BMET Recent Labs    07/01/19 0447 07/02/19 0431  NA 139 140  K 4.3 4.2  CL 103 101  CO2 23 24  GLUCOSE 85 140*  BUN 16 23*  CREATININE 0.78 0.73  CALCIUM 9.0 8.9   PT/INR No results for input(s): LABPROT, INR in the last 72 hours. CMP     Component Value Date/Time   NA 140 07/02/2019 0431   K 4.2 07/02/2019 0431   CL 101 07/02/2019 0431   CO2 24 07/02/2019 0431   GLUCOSE 140 (H) 07/02/2019 0431   BUN 23 (H) 07/02/2019 0431   CREATININE 0.73 07/02/2019 0431   CREATININE 0.55 (L) 06/25/2019 1157   CALCIUM 8.9 07/02/2019 0431   PROT 7.2 07/01/2019 1600   ALBUMIN 2.6 (L) 07/01/2019 1600   AST 25 06/30/2019 1157   AST 79 (H) 06/25/2019 1157   ALT 64 (H) 06/30/2019 1157   ALT 208 (H) 06/25/2019 1157   ALKPHOS 507 (H) 06/30/2019 1157   BILITOT 0.5 06/30/2019 1157   BILITOT 0.5 06/25/2019 1157   GFRNONAA >60 07/02/2019 0431   GFRNONAA >60 06/25/2019 1157   GFRAA >60 07/02/2019 0431   GFRAA >60 06/25/2019 1157   Lipase     Component Value Date/Time   LIPASE 19 06/23/2019 1156       Studies/Results: DG Chest 1 View  Result Date: 07/01/2019 CLINICAL DATA:  Shortness of breath. EXAM: CHEST  1 VIEW COMPARISON:  06/22/2019 CT chest 06/30/2019. FINDINGS: Right IJ power port tip is at the SVC RA junction. Large loculated left pleural effusion, slightly decreased in size from 06/22/2019 after today's thoracentesis. No pneumothorax. Collapse/consolidation in the lower left hemithorax. Small right pleural effusion with right basilar atelectasis. Lungs are hyperinflated. IMPRESSION: 1. Large residual loculated left pleural effusion, decreased in size after thoracentesis earlier today. No pneumothorax. 2. Collapse/consolidation in the lower left hemithorax continued follow-up to clearing is recommended as underlying malignancy cannot be excluded. 3. Small  right pleural effusion. Electronically Signed   By: Lorin Picket M.D.   On: 07/01/2019 14:54   CT Angio Chest PE W and/or Wo Contrast  Result Date: 06/30/2019 CLINICAL DATA:  Shortness of breath. EXAM: CT ANGIOGRAPHY CHEST WITH CONTRAST TECHNIQUE: Multidetector CT imaging of the chest was performed using the standard protocol during bolus administration of intravenous contrast. Multiplanar CT image reconstructions and MIPs were obtained to evaluate the vascular anatomy. CONTRAST:  18m OMNIPAQUE IOHEXOL 300 MG/ML  SOLN COMPARISON:  03/30/2019 FINDINGS: Cardiovascular: There are no pulmonary emboli. Heart size is normal. The heart and other mediastinal structures are slightly shifted to the right by the large left pleural effusion. Mediastinum/Nodes: No enlarged mediastinal, hilar, or axillary lymph nodes. Thyroid gland, trachea, and esophagus demonstrate no significant findings. Lungs/Pleura: There has been a marked increase in the left pleural effusion with almost complete compressive atelectasis of left lower lobe and partial compressive atelectasis of the left upper lobe. Tiny right effusion. Small bleb in the right lower lobe, unchanged. Chronic irregular thickening of the fissures with slight pleural nodularity as described on the prior exam. This is unchanged. Upper Abdomen: Hepatomegaly. Ascites. Musculoskeletal: No chest wall abnormality. No acute or significant osseous findings. Review of the MIP images confirms the above findings. IMPRESSION: 1. No pulmonary emboli. 2. Marked increase in the large left pleural effusion with almost complete compressive atelectasis of the left lower lobe and partial compressive atelectasis of the left upper lobe. This effusion has a mass effect shifting the heart and other mediastinal structures to the right. 3. Tiny right effusion. 4. Hepatomegaly. 5. Ascites. Electronically Signed   By: JLorriane ShireM.D.   On: 06/30/2019 14:57   CT ABDOMEN PELVIS W  CONTRAST  Result Date: 06/30/2019 CLINICAL DATA:  49year old with acute abdominal pain and neutropenia. History of appendiceal cancer. EXAM: CT ABDOMEN AND PELVIS WITH CONTRAST TECHNIQUE: Multidetector CT imaging of the abdomen and pelvis was performed using the standard protocol following bolus administration of intravenous contrast. CONTRAST:  914mOMNIPAQUE IOHEXOL 300 MG/ML  SOLN COMPARISON:  06/22/1999 FINDINGS: Lower chest: Again noted is large pleural effusion at the left lung base with pleural enhancement. There appears to be extensive volume loss in the left lower lobe. Findings are suggestive for a complex or loculated left pleural effusion. Again noted is irregular pleural thickening along the right major fissure and mild pleural thickening at right lung base. Hepatobiliary: Common bile duct measures roughly 7 mm and this is similar to the previous examination. Stable mild intrahepatic biliary dilatation. Main portal venous system patent. Pancreas: Unremarkable. No pancreatic ductal dilatation or surrounding inflammatory changes. Spleen: Absent Adrenals/Urinary Tract: Limited evaluation of the adrenal glands. Fullness in the renal collecting systems bilaterally and similar to the prior examination. Mild to moderate amount of fluid in the urinary bladder. Stomach/Bowel: Distal rectum is distended with gas and stool. The proximal rectum just beyond the surgical anastomosis again demonstrates  diffuse wall thickening and concerning for a soft tissue lesion or neoplastic process. Area of wall thickening measures at least 4.9 cm in length and similar to the recent examination. There is a surgical anastomosis just proximal to this area of wall thickening and the colon proximal to the lesion is markedly distended and contains a large amount of stool. Findings are suggestive for a mechanical obstruction at this level. Large amount of stool throughout the left colon. There is a surgical bowel anastomosis the  upper mid abdomen and findings are compatible with right hemicolectomy. Again noted are dilated loops of small bowel throughout the abdomen. No gross abnormality to the stomach. Vascular/Lymphatic: Mild atherosclerotic disease in abdominal aorta and iliac arteries without aneurysm or dissection. The main visceral arteries are patent. This is a predominantly arterial phase examination and limited evaluation of the venous structures. Portal venous system is patent. No significant abdominopelvic lymphadenopathy. Reproductive: Limited evaluation of the prostate. Other: Minimal intra-abdominal and subcutaneous fat in the abdomen or pelvis. No evidence for free intraperitoneal air. A small amount of ascites, particularly in the anterior lower abdomen on sequence 2, image 54. Ascites appears to be similar to the recent comparison examination. Musculoskeletal: No acute bone abnormality. IMPRESSION: 1. Distal large bowel obstruction. Again noted is soft tissue thickening and irregularity involving the rectum just distal to the surgical anastomosis. Findings are concerning for neoplastic disease. Again noted is marked distension of the colon proximal to the lesion. The distended colon contains a large amount of stool. 2. Small amount of ascites, similar to the previous examination. 3. Large loculated left pleural effusion with marked volume loss in left lower lobe. There is also evidence for pleural thickening in the right lung concerning for metastatic disease. Chest findings are similar to the recent comparison examination. 4. Stable fullness of the renal collecting systems. 5. Mild intrahepatic and extrahepatic biliary dilatation is stable. Electronically Signed   By: Markus Daft M.D.   On: 06/30/2019 15:36   IR THORACENTESIS ASP PLEURAL SPACE W/IMG GUIDE  Result Date: 07/02/2019 INDICATION: Metastatic appendiceal carcinoma. Left pleural effusion. Request for diagnostic and therapeutic thoracentesis. EXAM: ULTRASOUND  GUIDED LEFT THORACENTESIS MEDICATIONS: 1% lidocaine 8 mL COMPLICATIONS: None immediate. PROCEDURE: An ultrasound guided thoracentesis was thoroughly discussed with the patient and questions answered. The benefits, risks, alternatives and complications were also discussed. The patient understands and wishes to proceed with the procedure. Written consent was obtained. Ultrasound was performed to localize and mark an adequate pocket of fluid in the left chest. The area was then prepped and draped in the normal sterile fashion. 1% Lidocaine was used for local anesthesia. Under ultrasound guidance a 6 Fr Safe-T-Centesis catheter was introduced. Thoracentesis was performed. The catheter was removed and a dressing applied. FINDINGS: A total of approximately 900 mL of amber fluid was removed. Samples were sent to the laboratory as requested by the clinical team. IMPRESSION: Successful ultrasound guided left thoracentesis yielding 900 mL of pleural fluid. Procedure stopped due to significant chest pain. Read by: Gareth Eagle, PA-C Electronically Signed   By: Corrie Mckusick D.O.   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  Unfortunately, we have discussed with GI and they feel he is not a candidate for a stent to be placed to relieve this obstruction.  - patient realizes that a colostomy is his best option to be able to eat more comfortably  - discussed with oncology and palliative care, trying to contact surgeon following him at [facility] - patient does not require emergent surgical intervention at this point, but we will continue following  - patient  prefers to have surgery at [facility] if able, but is agreeable to surgery here if needed  FEN -ok to have FLD VTE -per primary service, ok from surgery standpoint ID -none needed from our standpoint  LOS: 2 days    Brigid Re , [facility] 07/02/2019, 8:43 AM Please see Amion for pager number during day hours 7:00am-4:30pm

## 2019-07-02 NOTE — Progress Notes (Signed)
Nutrition Follow-up  DOCUMENTATION CODES:   Underweight, Severe malnutrition in context of chronic illness  INTERVENTION:   -D/c Boost Breeze po TID, each supplement provides 250 kcal and 9 grams of protein -Magic cup TID with meals, each supplement provides 290 kcal and 9 grams of protein -Ensure Enlive po TID, each supplement provides 350 kcal and 20 grams of protein -Continue MVI with minerals daily  NUTRITION DIAGNOSIS:   Severe Malnutrition related to chronic illness(appendiceal cancer) as evidenced by percent weight loss, severe fat depletion, severe muscle depletion.  Ongoing  GOAL:   Patient will meet greater than or equal to 90% of their needs  Progressing   MONITOR:   PO intake, Supplement acceptance, Diet advancement, Labs, Weight trends, Skin, I & O's  REASON FOR ASSESSMENT:   Consult Assessment of nutrition requirement/status  ASSESSMENT:   Derek Lloyd is a 49 y.o. male presenting with abdominal pain . PMH is significant for Appendiceal Carcinoma, STEMI secondary to chemo with EF 20-25%,  2/10- s/p lt thoracentesis (900 ml amber fluid removed) 2/11- advanced to full liquid diet  Reviewed I/O's: +1.1 L x 24 hours and +2.1 L since admission  UOP: 200 ml x 24 hours  Pt sitting up in bed eating breakfast at this time. Nutritional services ambassador at bedside, taking lunch order for pt.   Per general surgery notes, pt is not a candidate for stent, but may benefit from colostomy in the future.   Medications reviewed and include 0.9% sodium chloride infusion @ 10 ml/hr.    Labs reviewed.   Diet Order:   Diet Order            Diet full liquid Room service appropriate? Yes; Fluid consistency: Thin  Diet effective now              EDUCATION NEEDS:   No education needs have been identified at this time  Skin:  Skin Assessment: Reviewed RN Assessment  Last BM:  07/02/19  Height:   Ht Readings from Last 1 Encounters:  06/30/19 5' 10"   (1.778 m)    Weight:   Wt Readings from Last 1 Encounters:  07/01/19 42.3 kg    Ideal Body Weight:  75.5 kg  BMI:  Body mass index is 13.39 kg/m.  Estimated Nutritional Needs:   Kcal:  1700-1900  Protein:  90-105 grams  Fluid:  > 1.7 L    Loistine Chance, RD, LDN, [place] Registered Dietitian II Certified Diabetes Care and Education Specialist Please refer to [facility] for RD and/or RD on-call/weekend/after hours pager

## 2019-07-02 NOTE — Progress Notes (Signed)
IP PROGRESS NOTE  Subjective:   Derek Lloyd appears comfortable. He underwent a left thoracentesis yesterday. He reports developing dyspnea and pain during the thoracentesis procedure. He is having bowel movements.  Objective: Vital signs in last 24 hours: Blood pressure 100/74, pulse 100, temperature 98.7 F (37.1 C), temperature source Oral, resp. rate 18, height 5' 10"  (1.778 m), weight 93 lb 4.8 oz (42.3 kg), SpO2 97 %.  Intake/Output from previous day: 02/10 0701 - 02/11 0700 In: 1320 [P.O.:120; I.V.:1200] Out: 200 [Urine:200]  Physical Exam: GI: Mildly distended, no mass Extremities: Trace ankle edema bilaterally Neurologic: Alert and oriented  Portacath/PICC-without erythema  Lab Results: Recent Labs    07/01/19 0447 07/02/19 0431  WBC 12.0* 12.5*  HGB 13.3 12.4*  HCT 41.9 39.8  PLT 826* 763*    BMET Recent Labs    07/01/19 0447 07/02/19 0431  NA 139 140  K 4.3 4.2  CL 103 101  CO2 23 24  GLUCOSE 85 140*  BUN 16 23*  CREATININE 0.78 0.73  CALCIUM 9.0 8.9    Lab Results  Component Value Date   CEA1 4.80 03/23/2019    Studies/Results: DG Chest 1 View  Result Date: 07/01/2019 CLINICAL DATA:  Shortness of breath. EXAM: CHEST  1 VIEW COMPARISON:  06/22/2019 CT chest 06/30/2019. FINDINGS: Right IJ power port tip is at the SVC RA junction. Large loculated left pleural effusion, slightly decreased in size from 06/22/2019 after today's thoracentesis. No pneumothorax. Collapse/consolidation in the lower left hemithorax. Small right pleural effusion with right basilar atelectasis. Lungs are hyperinflated. IMPRESSION: 1. Large residual loculated left pleural effusion, decreased in size after thoracentesis earlier today. No pneumothorax. 2. Collapse/consolidation in the lower left hemithorax continued follow-up to clearing is recommended as underlying malignancy cannot be excluded. 3. Small right pleural effusion. Electronically Signed   By: Lorin Picket M.D.    On: 07/01/2019 14:54   CT Angio Chest PE W and/or Wo Contrast  Result Date: 06/30/2019 CLINICAL DATA:  Shortness of breath. EXAM: CT ANGIOGRAPHY CHEST WITH CONTRAST TECHNIQUE: Multidetector CT imaging of the chest was performed using the standard protocol during bolus administration of intravenous contrast. Multiplanar CT image reconstructions and MIPs were obtained to evaluate the vascular anatomy. CONTRAST:  40m OMNIPAQUE IOHEXOL 300 MG/ML  SOLN COMPARISON:  03/30/2019 FINDINGS: Cardiovascular: There are no pulmonary emboli. Heart size is normal. The heart and other mediastinal structures are slightly shifted to the right by the large left pleural effusion. Mediastinum/Nodes: No enlarged mediastinal, hilar, or axillary lymph nodes. Thyroid gland, trachea, and esophagus demonstrate no significant findings. Lungs/Pleura: There has been a marked increase in the left pleural effusion with almost complete compressive atelectasis of left lower lobe and partial compressive atelectasis of the left upper lobe. Tiny right effusion. Small bleb in the right lower lobe, unchanged. Chronic irregular thickening of the fissures with slight pleural nodularity as described on the prior exam. This is unchanged. Upper Abdomen: Hepatomegaly. Ascites. Musculoskeletal: No chest wall abnormality. No acute or significant osseous findings. Review of the MIP images confirms the above findings. IMPRESSION: 1. No pulmonary emboli. 2. Marked increase in the large left pleural effusion with almost complete compressive atelectasis of the left lower lobe and partial compressive atelectasis of the left upper lobe. This effusion has a mass effect shifting the heart and other mediastinal structures to the right. 3. Tiny right effusion. 4. Hepatomegaly. 5. Ascites. Electronically Signed   By: JLorriane ShireM.D.   On: 06/30/2019 14:57   CT  ABDOMEN PELVIS W CONTRAST  Result Date: 06/30/2019 CLINICAL DATA:  49 year old with acute abdominal pain  and neutropenia. History of appendiceal cancer. EXAM: CT ABDOMEN AND PELVIS WITH CONTRAST TECHNIQUE: Multidetector CT imaging of the abdomen and pelvis was performed using the standard protocol following bolus administration of intravenous contrast. CONTRAST:  31m OMNIPAQUE IOHEXOL 300 MG/ML  SOLN COMPARISON:  06/22/1999 FINDINGS: Lower chest: Again noted is large pleural effusion at the left lung base with pleural enhancement. There appears to be extensive volume loss in the left lower lobe. Findings are suggestive for a complex or loculated left pleural effusion. Again noted is irregular pleural thickening along the right major fissure and mild pleural thickening at right lung base. Hepatobiliary: Common bile duct measures roughly 7 mm and this is similar to the previous examination. Stable mild intrahepatic biliary dilatation. Main portal venous system patent. Pancreas: Unremarkable. No pancreatic ductal dilatation or surrounding inflammatory changes. Spleen: Absent Adrenals/Urinary Tract: Limited evaluation of the adrenal glands. Fullness in the renal collecting systems bilaterally and similar to the prior examination. Mild to moderate amount of fluid in the urinary bladder. Stomach/Bowel: Distal rectum is distended with gas and stool. The proximal rectum just beyond the surgical anastomosis again demonstrates diffuse wall thickening and concerning for a soft tissue lesion or neoplastic process. Area of wall thickening measures at least 4.9 cm in length and similar to the recent examination. There is a surgical anastomosis just proximal to this area of wall thickening and the colon proximal to the lesion is markedly distended and contains a large amount of stool. Findings are suggestive for a mechanical obstruction at this level. Large amount of stool throughout the left colon. There is a surgical bowel anastomosis the upper mid abdomen and findings are compatible with right hemicolectomy. Again noted are  dilated loops of small bowel throughout the abdomen. No gross abnormality to the stomach. Vascular/Lymphatic: Mild atherosclerotic disease in abdominal aorta and iliac arteries without aneurysm or dissection. The main visceral arteries are patent. This is a predominantly arterial phase examination and limited evaluation of the venous structures. Portal venous system is patent. No significant abdominopelvic lymphadenopathy. Reproductive: Limited evaluation of the prostate. Other: Minimal intra-abdominal and subcutaneous fat in the abdomen or pelvis. No evidence for free intraperitoneal air. A small amount of ascites, particularly in the anterior lower abdomen on sequence 2, image 54. Ascites appears to be similar to the recent comparison examination. Musculoskeletal: No acute bone abnormality. IMPRESSION: 1. Distal large bowel obstruction. Again noted is soft tissue thickening and irregularity involving the rectum just distal to the surgical anastomosis. Findings are concerning for neoplastic disease. Again noted is marked distension of the colon proximal to the lesion. The distended colon contains a large amount of stool. 2. Small amount of ascites, similar to the previous examination. 3. Large loculated left pleural effusion with marked volume loss in left lower lobe. There is also evidence for pleural thickening in the right lung concerning for metastatic disease. Chest findings are similar to the recent comparison examination. 4. Stable fullness of the renal collecting systems. 5. Mild intrahepatic and extrahepatic biliary dilatation is stable. Electronically Signed   By: AMarkus DaftM.D.   On: 06/30/2019 15:36   IR THORACENTESIS ASP PLEURAL SPACE W/IMG GUIDE  Result Date: 07/02/2019 INDICATION: Metastatic appendiceal carcinoma. Left pleural effusion. Request for diagnostic and therapeutic thoracentesis. EXAM: ULTRASOUND GUIDED LEFT THORACENTESIS MEDICATIONS: 1% lidocaine 8 mL COMPLICATIONS: None immediate.  PROCEDURE: An ultrasound guided thoracentesis was thoroughly discussed with the patient  and questions answered. The benefits, risks, alternatives and complications were also discussed. The patient understands and wishes to proceed with the procedure. Written consent was obtained. Ultrasound was performed to localize and mark an adequate pocket of fluid in the left chest. The area was then prepped and draped in the normal sterile fashion. 1% Lidocaine was used for local anesthesia. Under ultrasound guidance a 6 Fr Safe-T-Centesis catheter was introduced. Thoracentesis was performed. The catheter was removed and a dressing applied. FINDINGS: A total of approximately 900 mL of amber fluid was removed. Samples were sent to the laboratory as requested by the clinical team. IMPRESSION: Successful ultrasound guided left thoracentesis yielding 900 mL of pleural fluid. Procedure stopped due to significant chest pain. Read by: Gareth Eagle, PA-C Electronically Signed   By: Corrie Mckusick D.O.   On: 07/01/2019 14:47    Medications: I have reviewed the patient's current medications.  Assessment/Plan:  1. Metastatic adenocarcinoma of the appendix ? 10/02/2017 biopsy of an appendiceal orifice "polyp"revealed invasive adenocarcinoma ? CT abdomen/pelvis 09/26/2017 with evidence of carcinomatosis-omental caking and ascites ? Colonoscopy 10/02/2017 with multiple polyps-tubular adenomas ? Paracentesis 09/24/2017, 09/28/2017-no malignant cells identified ? Paracentesis 11/01/2017-malignant cells consistent with carcinoma ? Biopsy of left peritoneal mass 11/01/2017-poorly differentiated carcinoma ? CT chest 11/01/2017-? Small pleural nodules versus atelectasis, no other evidence of metastatic disease in the chest ? Cycle 1 FOLFOX 11/04/2017 ? Cycle 2 FOLFOX 11/18/2017 ? Cycle 3 FOLFOX 12/02/2017 ? Cycle 4 FOLFOX 12/17/2017 ? Cycle 5 FOLFOX 12/30/2017 ? Cycle 6 FOLFOX 01/14/2018 ? Restaging CTs at [facility] 01/15/2018-improved  abdominal ascites. Similar peritoneal metastatic burden with more discrete measurable peritoneal disease adjacent to the splenic flexure of the colon. Findings of diffuse bowel serosal metastatic involvement. ? 02/24/2018-laparotomy with right diaphragm peritonectomy, splenectomy, right colectomy, sigmoid colectomy, hyperthermic intraperitoneal chemotherapy mitomycin-C, stent placement;R1 resection(invasive adenocarcinoma, poorly differentiated, arising from the appendix; carcinoma invades to the wall of the appendix, into the surrounding adipose tissue and directly invades into the wall of the terminal ileum. Perineural invasion present. Total of 22 benign lymph nodes. Proximal distal margins free of tumor. Metastatic adenocarcinoma to the omentum. Benign spleen. Left gutter stripping metastatic adenocarcinoma; metastatic adenocarcinoma to the peritoneum;right diaphragm stripping metastatic adenocarcinoma; debrided tumor metastatic adenocarcinoma to the peritoneum;round ligament of liver metastatic adenocarcinoma). ? CTs 12/05/2018-new bilateral pleural effusions, moderate on the right and small on the left. Extensive nodularity along the pleural fissures on both sides and also along the medial pleura on the right. No recurrent omental disease. Progressive appearing ill defined infiltrating soft tissue mass in the pelvis along the rectosigmoid junction just below the anastomosis. ? Thoracentesis 12/17/2018-reactive mesothelial cells and acute inflammation ? 12/26/2018 flexible sigmoidoscopy-tortuous and anatomically abnormal left colon without any luminal masses, tumors. Sigmoid anastomosis noted about 20 cm from the anus associated with significant mucosal edema but not obvious tumors. ? Cycle 1 FOLFIRI 12/31/2018 ? 01/22/2019 guardant 360-no tumor related somatic alterations were detected in patient's sample ? Cycle 1 Irinotecan9/29/2020 ? Cycle 2 Irinotecan 03/09/2019 ? CTs  03/30/2019-interval development of large volume loculated fluid within the abdomen. Persistent soft tissue mass involving the rectosigmoid anastomosis concerning for residual/recurrent tumor. Not significantly changed. Increased caliber of small bowel loops with transition to distal caliber small bowel proximal to the enterocolonic anastomosis, concerning for partial small bowel obstruction. Decreased volume of right pleural effusion with increased volume of left effusion. Persistent similar perifissural nodularity within both lungs concerning for pleural spread of disease. ? CTs 06/30/2019-increased large left pleural effusion,  pleural thickening in the right lung distal large bowel obstruction soft tissue thickening/irregularity involving the rectum distal to the surgical anastomosis  2.Abdominal pain and bloating secondary to the distal colonic obstruction  3.Port-A-Cath placement 11/01/2017  4.Nausea following cycle 1 FOLFOX-Emend and Decadron prophylaxis added with cycle 2  5. Hospitalization 01/02/2019 through 01/03/2019 presenting with chest pain, EKG with atrial fibrillation rate 164; Echo showed left ventricle with severely reduced systolic function with an ejection fraction of 20 to 25%. Catheterization with moderate to severe left ventricular systolic dysfunction, low left ventricular end-diastolic pressure.On Coreg. Now followed by Dr. Stanford Breed.  6.  Admission 06/30/2019 with a colonic obstruction and progressive left pleural effusion  Left thoracentesis 07/01/2019  7.  Leukocytosis/thrombocytosis secondary to the splenectomy   Mr. Boule appears unchanged. He is waiting on a decision regarding surgery. I have attempted to contact Dr. Clovis Riley by pager and email. We will try to contact him again this morning. Mr. Roudebush needs a diverting colostomy.  Recommendations 1. Continue fluids, antiemetics, and analgesics 2. Decision on proceeding with surgery per Dr.  Garnette Czech and Dr. Clovis Riley           LOS: 2 days   Betsy Coder, MD   07/02/2019, 8:50 AM

## 2019-07-02 NOTE — Progress Notes (Signed)
Responded to spiritual care consult to provided and assist with AD.  Spoke with director who will ensure that patient get form and will page chaplain when ready. Patient was being seen by medical staff for care and Chaplain could not get to see patient.  Will follow as needed.  Jaclynn Major, [place], [facility], Pager [phone]

## 2019-07-02 NOTE — Progress Notes (Signed)
Family Medicine Teaching Service Daily Progress Note Intern Pager: [phone]  Patient name: Derek Lloyd Medical record number: [id number] Date of birth: 03-10-1971 Age: 49 y.o. Gender: male  Primary Care Provider: Ladell Pier, MD Consultants: Surgery, IR, Oncology Code Status: Full  Pt Overview and Major Events to Date:  02/09-admitted  Assessment and Plan: Derek Lloyd is a 49 y.o. male presenting with abdominal pain . PMH is significant for Appendiceal Carcinoma, STEMI secondary to chemo with EF 20-25%   Abdominal Pain secondary Large Bowel Obstruction due to Stage 4 appendiceal carcinoma  Stable overnight.  Continues to have abdominal discomfort.  Moved bowels today and today.  Continues to have reflux.  Pain controlled with pain medication patient is requesting to change to morphine 3 mg every 2 hours and Dilaudid 1 mg every 4 hours. 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  Left ventricle diastolic function could not be evaluated.  The right ventricle had moderately reduced systolic function. On exam patient appears euvolemic -Strict I's and O's -Daily weights -Decrease IV fluids to KVO -Clear liquid diet -Coreg 3.125 g daily -Consider SGLT2 inhibitor -Consider repeat echo  SOB without hypoxia likely secondary to Large left pleural effusion Stable overnight.  Thoracentesis yesterday entry 900 mL amber fluid.  Denies any shortness of breath. Oxygen saturations 97% on room air and no increased work of breathing. -Monitor respiratory status -Maintain oxygen saturation greater than 90% -Follow-up cytology  Appendiceal Cancer Followed at [facility] Dr. Ammie Lloyd.  Patient has Right upper Port-a-Cath for chemo which he received his last dose in October.   -Palliative consult  Thrombocytosis likely secondary to malignancy Platelets trending down 763 -Continue to monitor -On heparin for prophylaxis Platelets 790> 811> 826 -Continue to monitor   FEN/GI:  -Clear fluids -IV normal saline KVO Prophylaxis:  -Heparin 5000 units subcu every 8   Disposition: Home medically stable  Subjective:  No acute events overnight.  Denies any shortness of breath, chest pain.  Abdominal pain well controlled medication.  Patient would like Morphine increased to every 2 hours and Dilaudid decreased to every 4 hours.  Patient also wants to have clear fluids as he is not having surgery today.  He does report having some retained fluid in his extremities.  Objective: Temp:  [97.6 F (36.4 C)-98.7 F (37.1 C)] 98.7 F (37.1 C) (02/11 0413) Pulse Rate:  [100-110] 100 (02/11 0413) Resp:  [16-24] 18 (02/11 0413) BP: (100-114)/(74-86) 100/74 (02/11 0413) SpO2:  [97 %-100 %] 97 % (02/11 0413) Weight:  [42.3 kg] 42.3 kg (02/10 2139) Physical Exam: General: 49 year old male, ill-appearing and cachectic, in no acute distress Cardiovascular: Regular rate and rhythm, no gallops or murmurs appreciated Respiratory: Chest clear to auscultation bilaterally, diminished at bases.  No crackles or wheezing heard on exam, no increased work of breathing, cap refill good. Abdomen: Firm but slightly improved, hyperactive bowel sounds.  Pain elicited on light palpation. Extremities: Trace lower extremity edema noted.  Laboratory: Recent Labs  Lab 06/30/19 1853 07/01/19 0447 07/02/19 0431  WBC 10.7* 12.0* 12.5*  HGB 12.7* 13.3 12.4*  HCT 39.6 41.9 39.8  PLT 811* 826* 763*   Recent Labs  Lab 06/25/19 1157 06/25/19 1157 06/30/19  1157 06/30/19 1157 06/30/19 1853 07/01/19 0447 07/01/19 1600 07/02/19 0431  NA 138   < > 139  --   --  139   --  140  K 3.7   < > 4.4  --   --  4.3  --  4.2  CL 101   < > 102  --   --  103  --  101  CO2 28   < > 26  --   --  23  --  24  BUN 14   < > 11  --   --  16  --  23*  CREATININE 0.55*  --  0.71   < > 0.77 0.78  --  0.73  CALCIUM 8.6*   < > 9.0  --   --  9.0  --  8.9  PROT 7.0  --  7.0  --   --   --  7.2  --   BILITOT 0.5  --  0.5  --   --   --   --   --   ALKPHOS 689*  --  507*  --   --   --   --   --   ALT 208*  --  64*  --   --   --   --   --   AST 79*  --  25  --   --   --   --   --   GLUCOSE 101*   < > 75  --   --  85  --  140*   < > = values in this interval not displayed.      Imaging/Diagnostic Tests: DG Chest 1 View  Result Date: 07/01/2019 CLINICAL DATA:  Shortness of breath. EXAM: CHEST  1 VIEW COMPARISON:  06/22/2019 CT chest 06/30/2019. FINDINGS: Right IJ power port tip is at the SVC RA junction. Large loculated left pleural effusion, slightly decreased in size from 06/22/2019 after today's thoracentesis. No pneumothorax. Collapse/consolidation in the lower left hemithorax. Small right pleural effusion with right basilar atelectasis. Lungs are hyperinflated. IMPRESSION: 1. Large residual loculated left pleural effusion, decreased in size after thoracentesis earlier today. No pneumothorax. 2. Collapse/consolidation in the lower left hemithorax continued follow-up to clearing is recommended as underlying malignancy cannot be excluded. 3. Small right pleural effusion. Electronically Signed   By: Lorin Picket M.D.   On: 07/01/2019 14:54    Carollee Leitz, MD 07/02/2019, 6:27 AM PGY-1, [place] Intern pager: [phone], text pages welcome

## 2019-07-02 NOTE — Progress Notes (Signed)
PT Cancellation Note  Patient Details Name: Derek Lloyd MRN: [id number] DOB: 10/13/1970   Cancelled Treatment:    Reason Eval/Treat Not Completed: Patient declined, no reason specified. Pt reporting that he has been awake and active since 4:00am and politely declining therapy today. PT will continue to f/u with pt acutely to initiate evaluation.    [place] 07/02/2019, 4:22 PM

## 2019-07-02 NOTE — Progress Notes (Signed)
Received page that [facility] had bed availability and that the charge nurse and the floor was attempting to arrange transport.  [facility] form filled out, [facility] so that he may maintain continuity with his prior surgeon.  Went to the room to discuss risks of transfer with the patient, he was speaking in a full voice through the bathroom door as he was taking a shower.  He said that he felt fine and understood there is some risk to leaving the hospital to go to [facility].  His main complaint about going tonight was that he wanted more time to pack all of his personal items.  I explained to him that he would need to pack quickly because he was transferring tonight  Dr. Criss Rosales

## 2019-07-03 ENCOUNTER — Telehealth: Payer: Self-pay | Admitting: *Deleted

## 2019-07-03 NOTE — Progress Notes (Signed)
[facility] present to transport patient to [facility]. MD made aware. Verbal order made to sign medical necessity form. Paperwork sent with patient.

## 2019-07-03 NOTE — Telephone Encounter (Signed)
Left VM that he was transferred to [facility] last night and says "they aren't doing anything to help me. They are neglecting me". Not allowed to eat or drink, has had several bowel movements today. States no one is telling him anything. Asking for Dr. Benay Spice to call Dr. Clovis Riley and request "something to be done for me".

## 2019-07-03 NOTE — Discharge Summary (Signed)
[facility] Discharge Summary  Patient name: Derek Lloyd Medical record number: [id number] Date of birth: 04-02-1971 Age: 49 y.o. Gender: male Date of Admission: 06/30/2019  Date of Discharge: 07/02/2019 Admitting Physician: Carollee Leitz, MD  Primary Care Provider: Ladell Pier, MD Consultants: Surgery  Indication for Hospitalization: :Large bowel obstruction   Discharge Diagnoses/Problem List:  Stage IV carcinoma of the appendix Large bowel obstruction HFrEF Malnutrition  Disposition: Transfer to [facility], attending Dr. Clovis Riley  Discharge Condition: Stable  Discharge Exam: 07/01/2018 General: 49 year old male, ill-appearing and cachectic, in no acute distress Cardiovascular: Regular rate and rhythm, no gallops or murmurs appreciated Respiratory: Chest clear to auscultation bilaterally, diminished at bases.  No crackles or wheezing heard on exam, no increased work of breathing, cap refill good. Abdomen: Firm but slightly improved, hyperactive bowel sounds.  Pain elicited on light palpation. Extremities: Trace lower extremity edema noted. Brief Hospital Course:   Abdominal pain Patient presented to ED with abdominal pain.  Had not moved bowels in 3 weeks.  History of appendix carcinoma.  CT abdomen showed distal large bowel obstruction.  Small amount of ascites.  Large loculated left pleural effusion.  Surgery was consulted and planned for surgical intervention.  It was decided with patient to transfer to [facility] under the care of Dr. Clovis Riley who is familiar with patient and performed previous surgery.  Patient transferred to [facility] under care of Dr. Clovis Riley.   Recommend continued discussion with Palliative Care. Patient was seen by them during this admission. Not interested in hospice at this time. Patient seen by NP Elicia Lamp during hospital stay.   Shortness of breath CT angio negative for PE.  There was marked increase in left  pleural effusion almost complete compressive atelectasis of the left lower lobe.  IR was consulted and thoracentesis was performed yielded 900 mLs amber fluid.  Cytology continued but no malignant cells.  Numerous lymphoid cells are identified.  Thrombocytosis likely secondary to malignancy, continue to monitor.   Left Pleural Effusion, with compressive atelectasis, s/p thoracentesis. Recommend repeat imaging in 2-4 weeks. Final analysis pending at time of discharge.    Issues for Follow Up:  1. Follow up cytology from pleural fluid, Dr. Jeannine Kitten to call lab for fluid analysis.   Cytology FINAL MICROSCOPIC DIAGNOSIS:  - No malignant cells identified  - Numerous lymphoid cells   Significant Procedures: Thoracocentesis 2/10   Significant Labs and Imaging:  Recent Labs  Lab 06/30/19 1853 07/01/19 0447 07/02/19 0431  WBC 10.7* 12.0* 12.5*  HGB 12.7* 13.3 12.4*  HCT 39.6 41.9 39.8  PLT 811* 826* 763*   Recent Labs  Lab 06/30/19 1157 06/30/19 1157 06/30/19 1853 07/01/19 0447 07/01/19 1600 07/02/19 0431  NA 139  --   --  139  --  140  K 4.4   < >  --  4.3  --  4.2  CL 102  --   --  103  --  101  CO2 26  --   --  23  --  24  GLUCOSE 75  --   --  85  --  140*  BUN 11  --   --  16  --  23*  CREATININE 0.71  --  0.77 0.78  --  0.73  CALCIUM 9.0  --   --  9.0  --  8.9  ALKPHOS 507*  --   --   --   --   --   AST 25  --   --   --   --   --  ALT 64*  --   --   --   --   --   ALBUMIN 2.6*  --   --   --  2.6*  --    < > = values in this interval not displayed.    Results/Tests Pending at Time of Discharge: Final results of pleural fluid   Discharge Medications:  Allergies as of 07/03/2019      Reactions   Piperacillin-tazobactam In Dex Rash   Noted on May 2019 admission.  Tolerates Cephalosporins.      Medication List    TAKE these medications   ALPRAZolam 0.5 MG tablet Commonly known as: XANAX Take 1 tablet (0.5 mg total) by mouth 2 (two) times daily as needed for  anxiety.   carvedilol 3.125 MG tablet Commonly known as: COREG Take 1 tablet (3.125 mg total) by mouth 2 (two) times daily with a meal.   HYDROcodone-acetaminophen 5-325 MG tablet Commonly known as: NORCO/VICODIN Take 1-2 tablets by mouth every 6 (six) hours as needed for moderate pain.   hydrocortisone 2.5 % rectal cream Commonly known as: Anusol-HC Place 1 application rectally 2 (two) times daily.       polyethylene glycol powder 17 GM/SCOOP powder Commonly known as: GLYCOLAX/MIRALAX Take 17 g by mouth daily.       Discharge Instructions: Patient discharged to [facility] who will provide discharge instructions.    Follow-Up Appointments:  Follow up with Dr. Benay Spice after discharge.

## 2019-07-05 MED ORDER — ONDANSETRON HCL 4 MG/2ML IJ SOLN
4.00 | INTRAMUSCULAR | Status: DC
Start: ? — End: 2019-07-05

## 2019-07-05 MED ORDER — ACETAMINOPHEN 325 MG PO TABS
325.00 | ORAL_TABLET | ORAL | Status: DC
Start: 2019-07-06 — End: 2019-07-05

## 2019-07-05 MED ORDER — ALPRAZOLAM 0.5 MG PO TABS
0.50 | ORAL_TABLET | ORAL | Status: DC
Start: ? — End: 2019-07-05

## 2019-07-05 MED ORDER — CALCIUM CARBONATE ANTACID 500 MG PO CHEW
500.00 | CHEWABLE_TABLET | ORAL | Status: DC
Start: 2019-07-11 — End: 2019-07-05

## 2019-07-05 MED ORDER — HYDROCODONE-ACETAMINOPHEN 5-325 MG PO TABS
1.00 | ORAL_TABLET | ORAL | Status: DC
Start: ? — End: 2019-07-05

## 2019-07-05 MED ORDER — MELATONIN 3 MG PO TABS
6.00 | ORAL_TABLET | ORAL | Status: DC
Start: 2019-07-11 — End: 2019-07-05

## 2019-07-05 MED ORDER — KCL IN DEXTROSE-NACL 20-5-0.45 MEQ/L-%-% IV SOLN
INTRAVENOUS | Status: DC
Start: ? — End: 2019-07-05

## 2019-07-05 MED ORDER — PANTOPRAZOLE SODIUM 40 MG PO TBEC
40.00 | DELAYED_RELEASE_TABLET | ORAL | Status: DC
Start: 2019-07-12 — End: 2019-07-05

## 2019-07-05 MED ORDER — HYDROXYZINE HCL 25 MG PO TABS
25.00 | ORAL_TABLET | ORAL | Status: DC
Start: ? — End: 2019-07-05

## 2019-07-06 MED ORDER — ACETAMINOPHEN 500 MG PO TABS
1000.00 | ORAL_TABLET | ORAL | Status: DC
Start: 2019-07-11 — End: 2019-07-06

## 2019-07-06 MED ORDER — GENERIC EXTERNAL MEDICATION
Status: DC
Start: ? — End: 2019-07-06

## 2019-07-07 ENCOUNTER — Telehealth: Payer: Self-pay | Admitting: Nurse Practitioner

## 2019-07-07 MED ORDER — PHENOL 1.4 % MT LIQD
1.00 | OROMUCOSAL | Status: DC
Start: ? — End: 2019-07-07

## 2019-07-07 MED ORDER — KETOROLAC TROMETHAMINE 15 MG/ML IJ SOLN
15.00 | INTRAMUSCULAR | Status: DC
Start: ? — End: 2019-07-07

## 2019-07-07 MED ORDER — ENOXAPARIN SODIUM 30 MG/0.3ML ~~LOC~~ SOLN
30.00 | SUBCUTANEOUS | Status: DC
Start: 2019-07-12 — End: 2019-07-07

## 2019-07-07 MED ORDER — GUAIFENESIN ER 600 MG PO TB12
600.00 | ORAL_TABLET | ORAL | Status: DC
Start: 2019-07-11 — End: 2019-07-07

## 2019-07-07 MED ORDER — GENERIC EXTERNAL MEDICATION
Status: DC
Start: ? — End: 2019-07-07

## 2019-07-07 NOTE — Telephone Encounter (Signed)
R/S 2/18 appt per MD. Pt is aware of new appt date and time.

## 2019-07-09 ENCOUNTER — Inpatient Hospital Stay: Payer: Medicaid Other | Admitting: Nurse Practitioner

## 2019-07-09 ENCOUNTER — Inpatient Hospital Stay: Payer: Medicaid Other

## 2019-07-10 MED ORDER — SIMETHICONE 40 MG/0.6ML PO SUSP
80.00 | ORAL | Status: DC
Start: ? — End: 2019-07-10

## 2019-07-10 MED ORDER — MORPHINE SULFATE ER 30 MG PO TBCR
30.00 | EXTENDED_RELEASE_TABLET | ORAL | Status: DC
Start: 2019-07-11 — End: 2019-07-10

## 2019-07-10 MED ORDER — ALPRAZOLAM 0.5 MG PO TABS
0.50 | ORAL_TABLET | ORAL | Status: DC
Start: ? — End: 2019-07-10

## 2019-07-10 MED ORDER — GENERIC EXTERNAL MEDICATION
6.25 | Status: DC
Start: ? — End: 2019-07-10

## 2019-07-10 MED ORDER — MORPHINE SULFATE 10 MG/5ML PO SOLN
7.50 | ORAL | Status: DC
Start: ? — End: 2019-07-10

## 2019-07-10 MED ORDER — SULFAMETHOXAZOLE-TRIMETHOPRIM 800-160 MG PO TABS
1.00 | ORAL_TABLET | ORAL | Status: DC
Start: 2019-07-11 — End: 2019-07-10

## 2019-07-10 MED ORDER — GENERIC EXTERNAL MEDICATION
Status: DC
Start: ? — End: 2019-07-10

## 2019-07-10 MED ORDER — SCOPOLAMINE 1 MG/3DAYS TD PT72
1.00 | MEDICATED_PATCH | TRANSDERMAL | Status: DC
Start: 2019-07-12 — End: 2019-07-10

## 2019-07-10 MED ORDER — CARBOXYMETHYLCELLULOSE SODIUM 0.5 % OP SOLN
1.00 | OPHTHALMIC | Status: DC
Start: ? — End: 2019-07-10

## 2019-07-13 ENCOUNTER — Telehealth: Payer: Self-pay

## 2019-07-13 NOTE — Telephone Encounter (Signed)
Spoke with Hospice team regarding pt's care. Hospice team informed this office that this patient is now deceased.

## 2019-07-20 DEATH — deceased

## 2019-07-30 ENCOUNTER — Other Ambulatory Visit: Payer: Medicaid Other

## 2019-07-30 ENCOUNTER — Inpatient Hospital Stay: Payer: Medicaid Other

## 2019-07-30 ENCOUNTER — Ambulatory Visit: Payer: Medicaid Other | Admitting: Nurse Practitioner

## 2020-08-11 IMAGING — DX DG ABDOMEN 2V
3 series · 3 of 3 positions shown · non-contrast
Comparison: 03/30/2019 CT abdomen/pelvis.

CLINICAL DATA: Appendiceal cancer, nausea, left upper quadrant
abdominal pain, vomiting.

EXAM:
ABDOMEN - 2 VIEW

[abdomen erect]
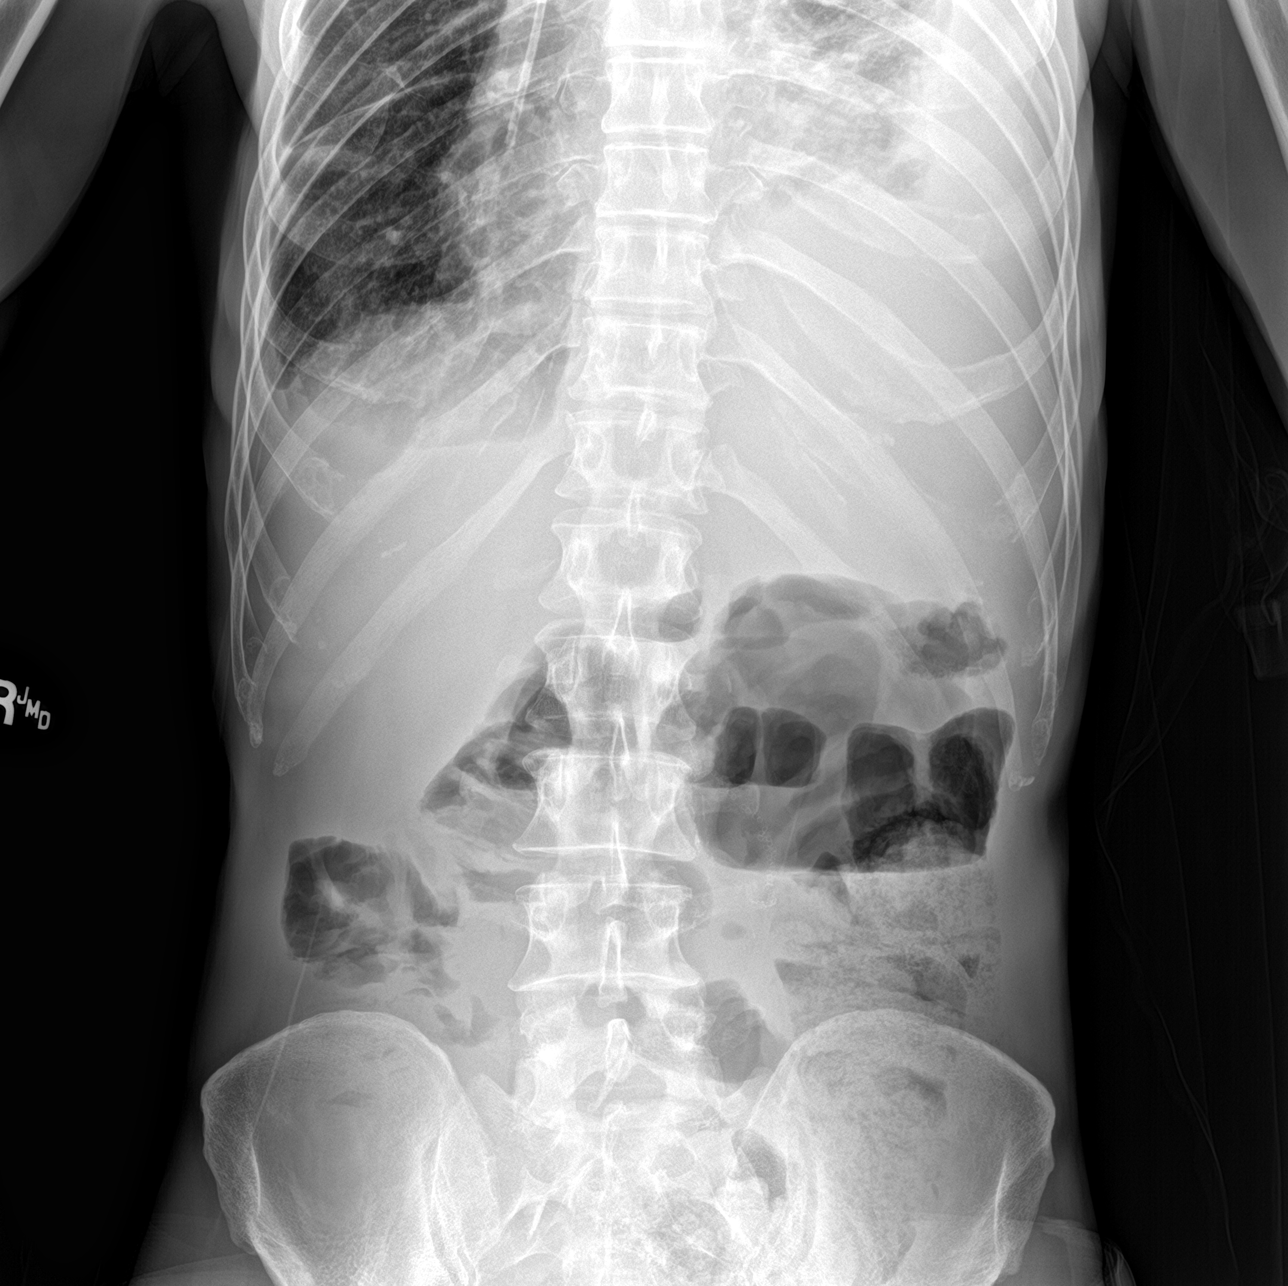

[abdomen supine (1 of 2)]
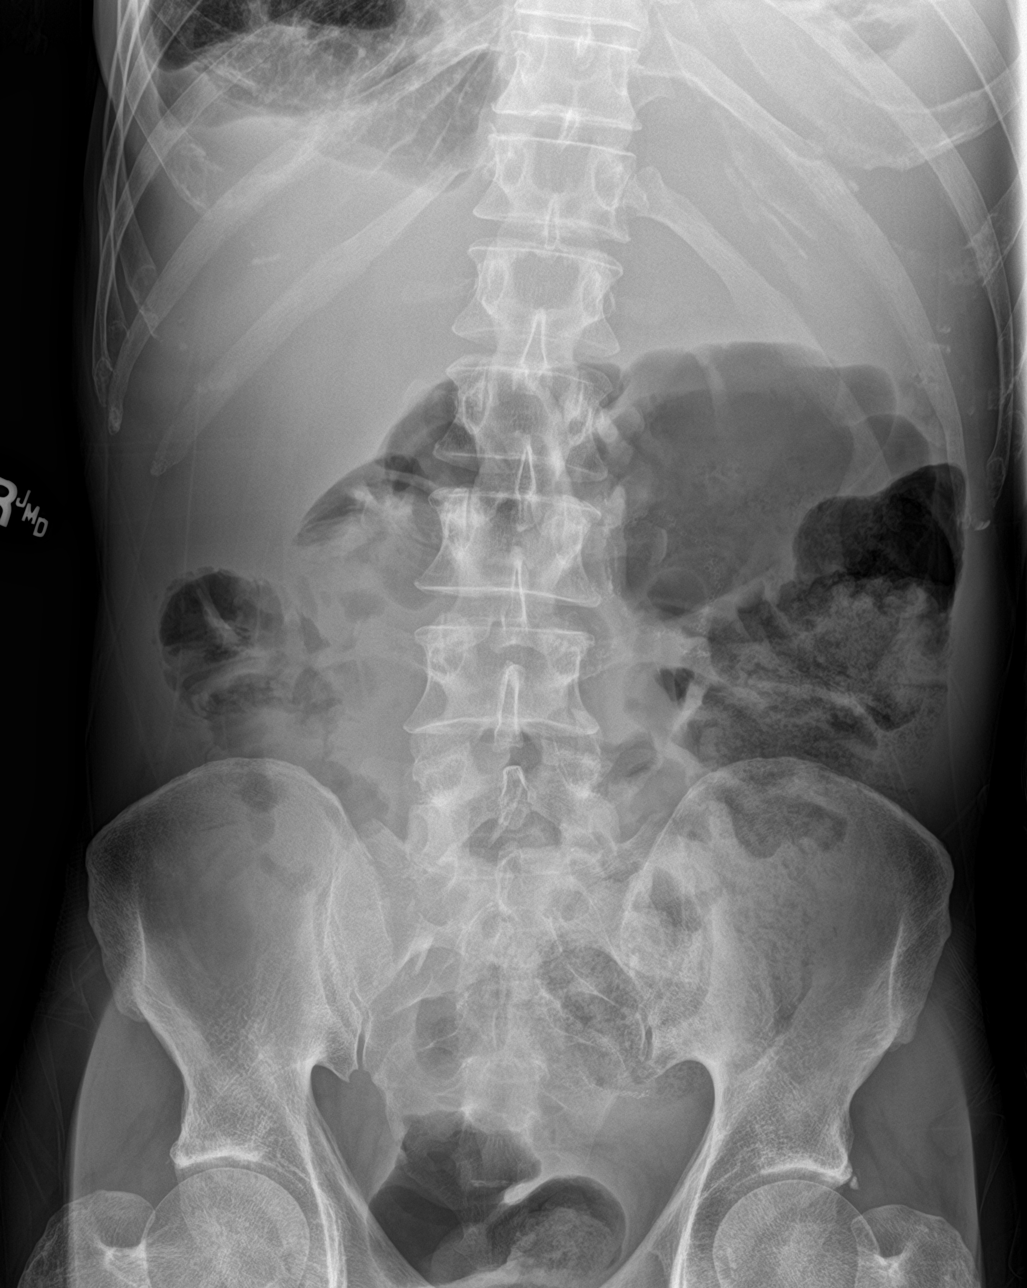

[abdomen supine (2 of 2)]
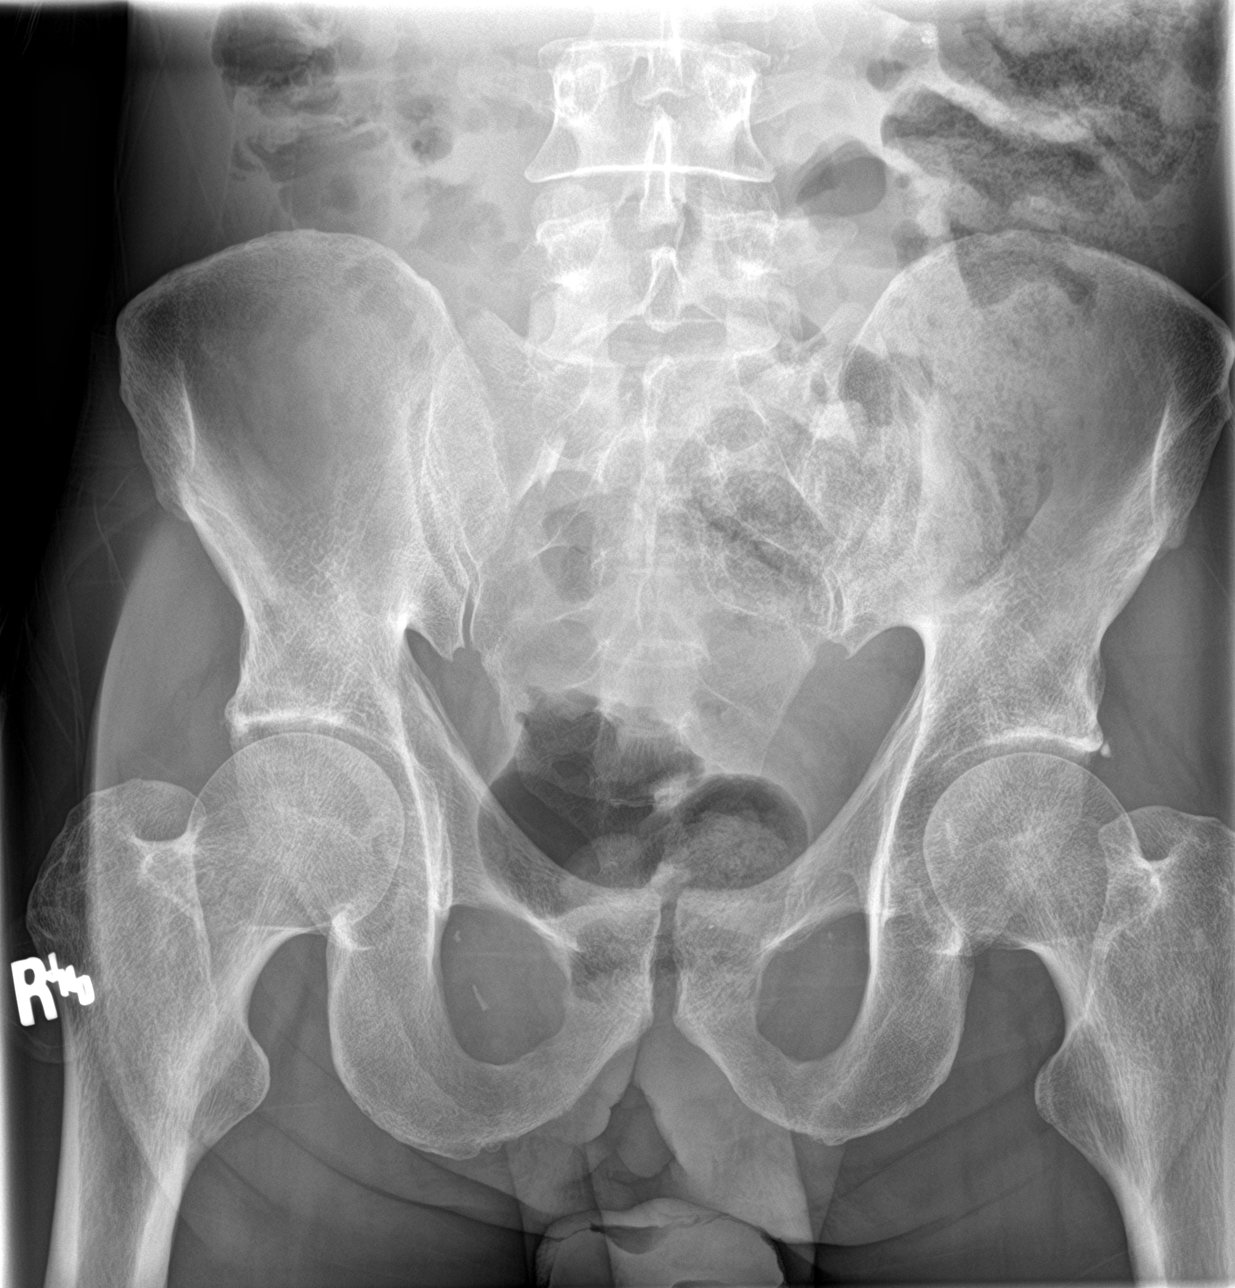

[3 of 3 positions shown; findings below may reference images not displayed]

FINDINGS: Tip superior approach central venous catheter seen overlying the
right atrium. Partially visualized bilateral pleural effusions,
small on the right and at least moderate on the left. No
disproportionately dilated small bowel loops. Surgical sutures noted
in the upper and lower left abdomen. Moderate to large amount of gas
and stool throughout the remnant large bowel in the left abdomen.
Fluid level in the left upper quadrant large bowel. No evidence of
pneumatosis or pneumoperitoneum. No radiopaque nephrolithiasis.
IMPRESSION: 1. Nonobstructive bowel gas pattern. Moderate to large amount of gas
and stool throughout the remnant large bowel with fluid level in
proximal remnant large-bowel, as seen on recent CT. Findings suggest
constipation. No evidence of free air.
2. Bilateral pleural effusions at the lung bases, left greater than
right, at least moderate on the left.

## 2020-09-30 IMAGING — US IR THORACENTESIS ASP PLEURAL SPACE W/IMG GUIDE
1 series · 2 of 2 positions shown · non-contrast
Comparison: none

INDICATION: Metastatic appendiceal carcinoma. Left pleural effusion. Request for
diagnostic and therapeutic thoracentesis.

[Series 1: (id) · 2 of 2 slices shown]
[im 1/2]
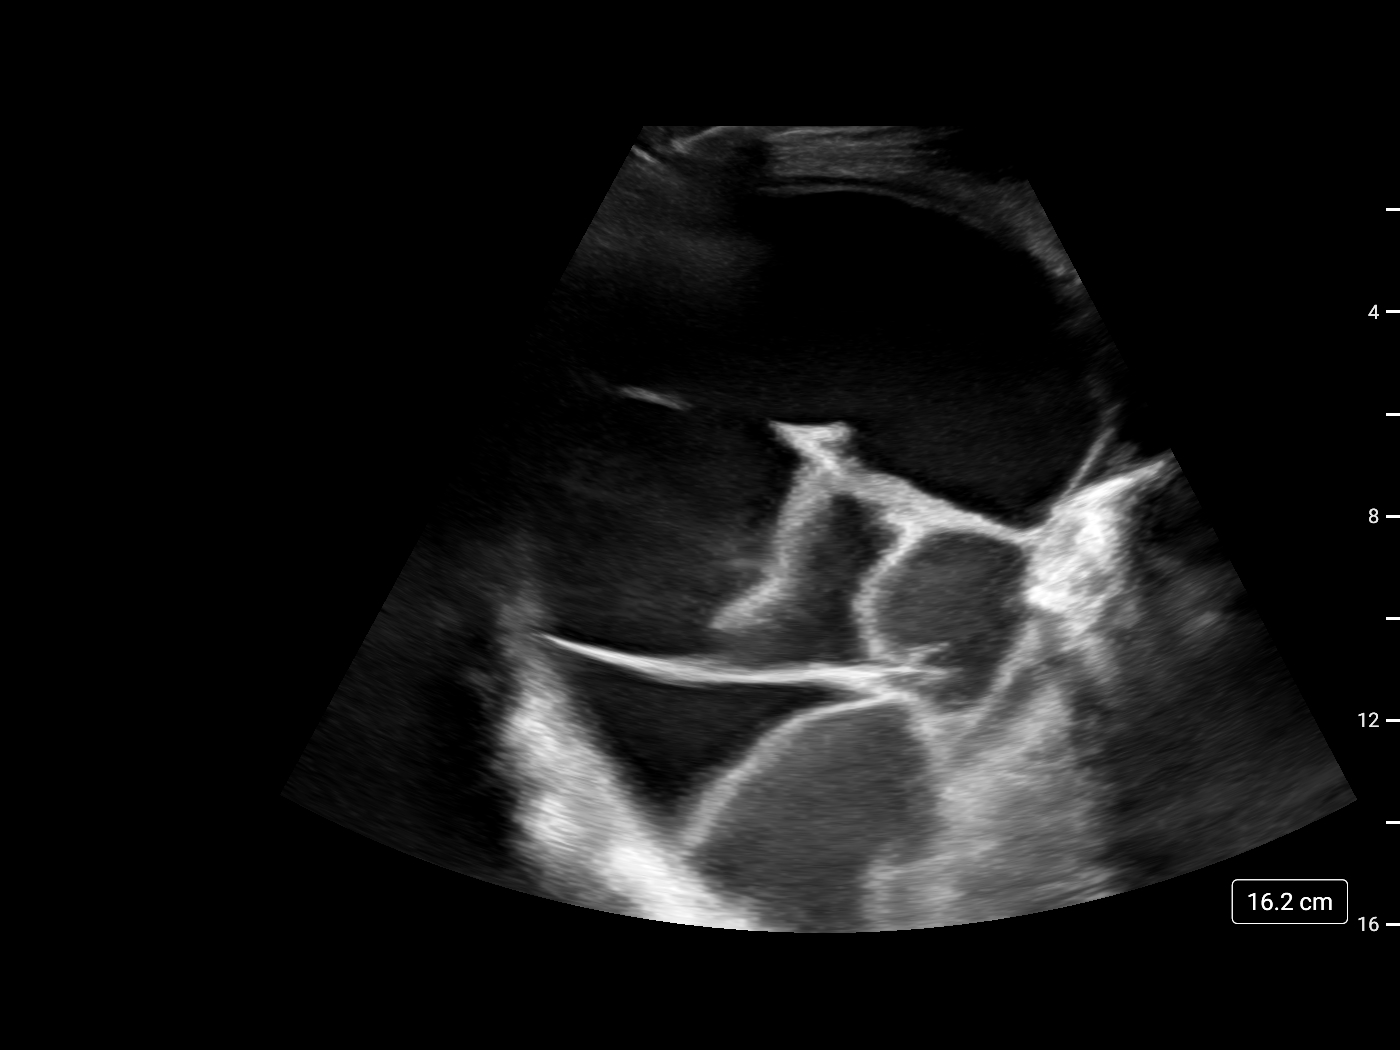
[im 2/2]
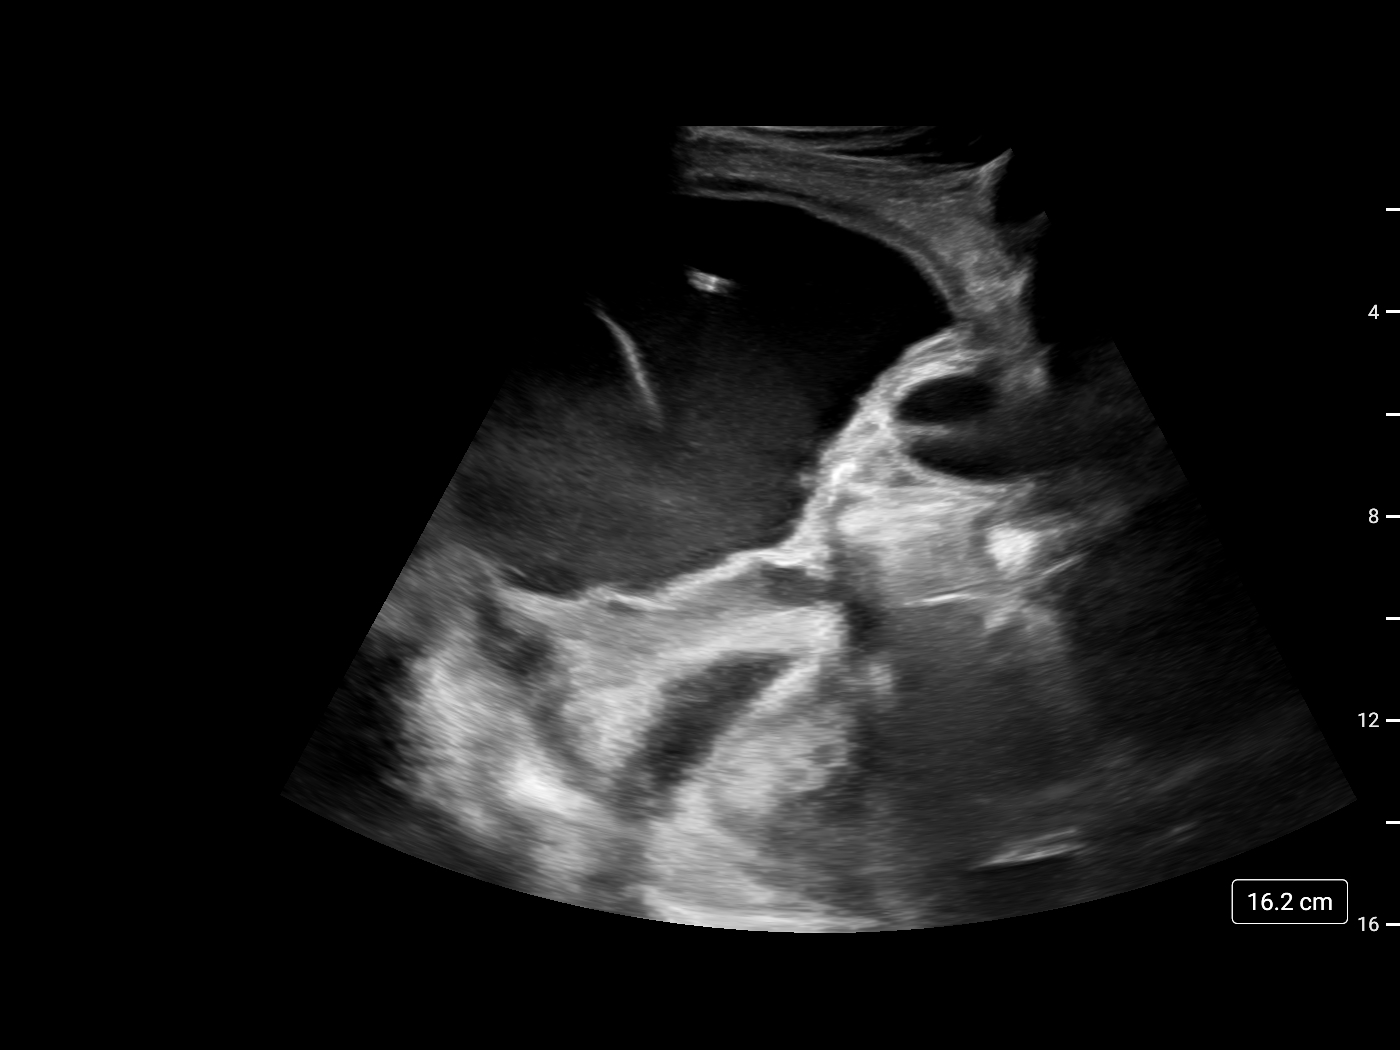

[2 of 2 positions shown; findings below may reference images not displayed]

EXAM:
ULTRASOUND GUIDED LEFT THORACENTESIS

MEDICATIONS:
1% lidocaine 8 mL

COMPLICATIONS:
None immediate.

PROCEDURE:
An ultrasound guided thoracentesis was thoroughly discussed with the
patient and questions answered. The benefits, risks, alternatives
and complications were also discussed. The patient understands and
wishes to proceed with the procedure. Written consent was obtained.

Ultrasound was performed to localize and mark an adequate pocket of
fluid in the left chest. The area was then prepped and draped in the
normal sterile fashion. 1% Lidocaine was used for local anesthesia.
Under ultrasound guidance a 6 Fr Safe-T-Centesis catheter was
introduced. Thoracentesis was performed. The catheter was removed
and a dressing applied.
FINDINGS: A total of approximately 900 mL of amber fluid was removed. Samples
were sent to the laboratory as requested by the clinical team.
IMPRESSION: Successful ultrasound guided left thoracentesis yielding 900 mL of
pleural fluid. Procedure stopped due to significant chest pain.

## 2020-09-30 IMAGING — DX DG CHEST 1V
1 series · 1 of 1 positions shown · non-contrast
Comparison: 06/22/2019 CT chest 06/30/2019.

CLINICAL DATA: Shortness of breath.

EXAM:
CHEST  1 VIEW

[chest ap]
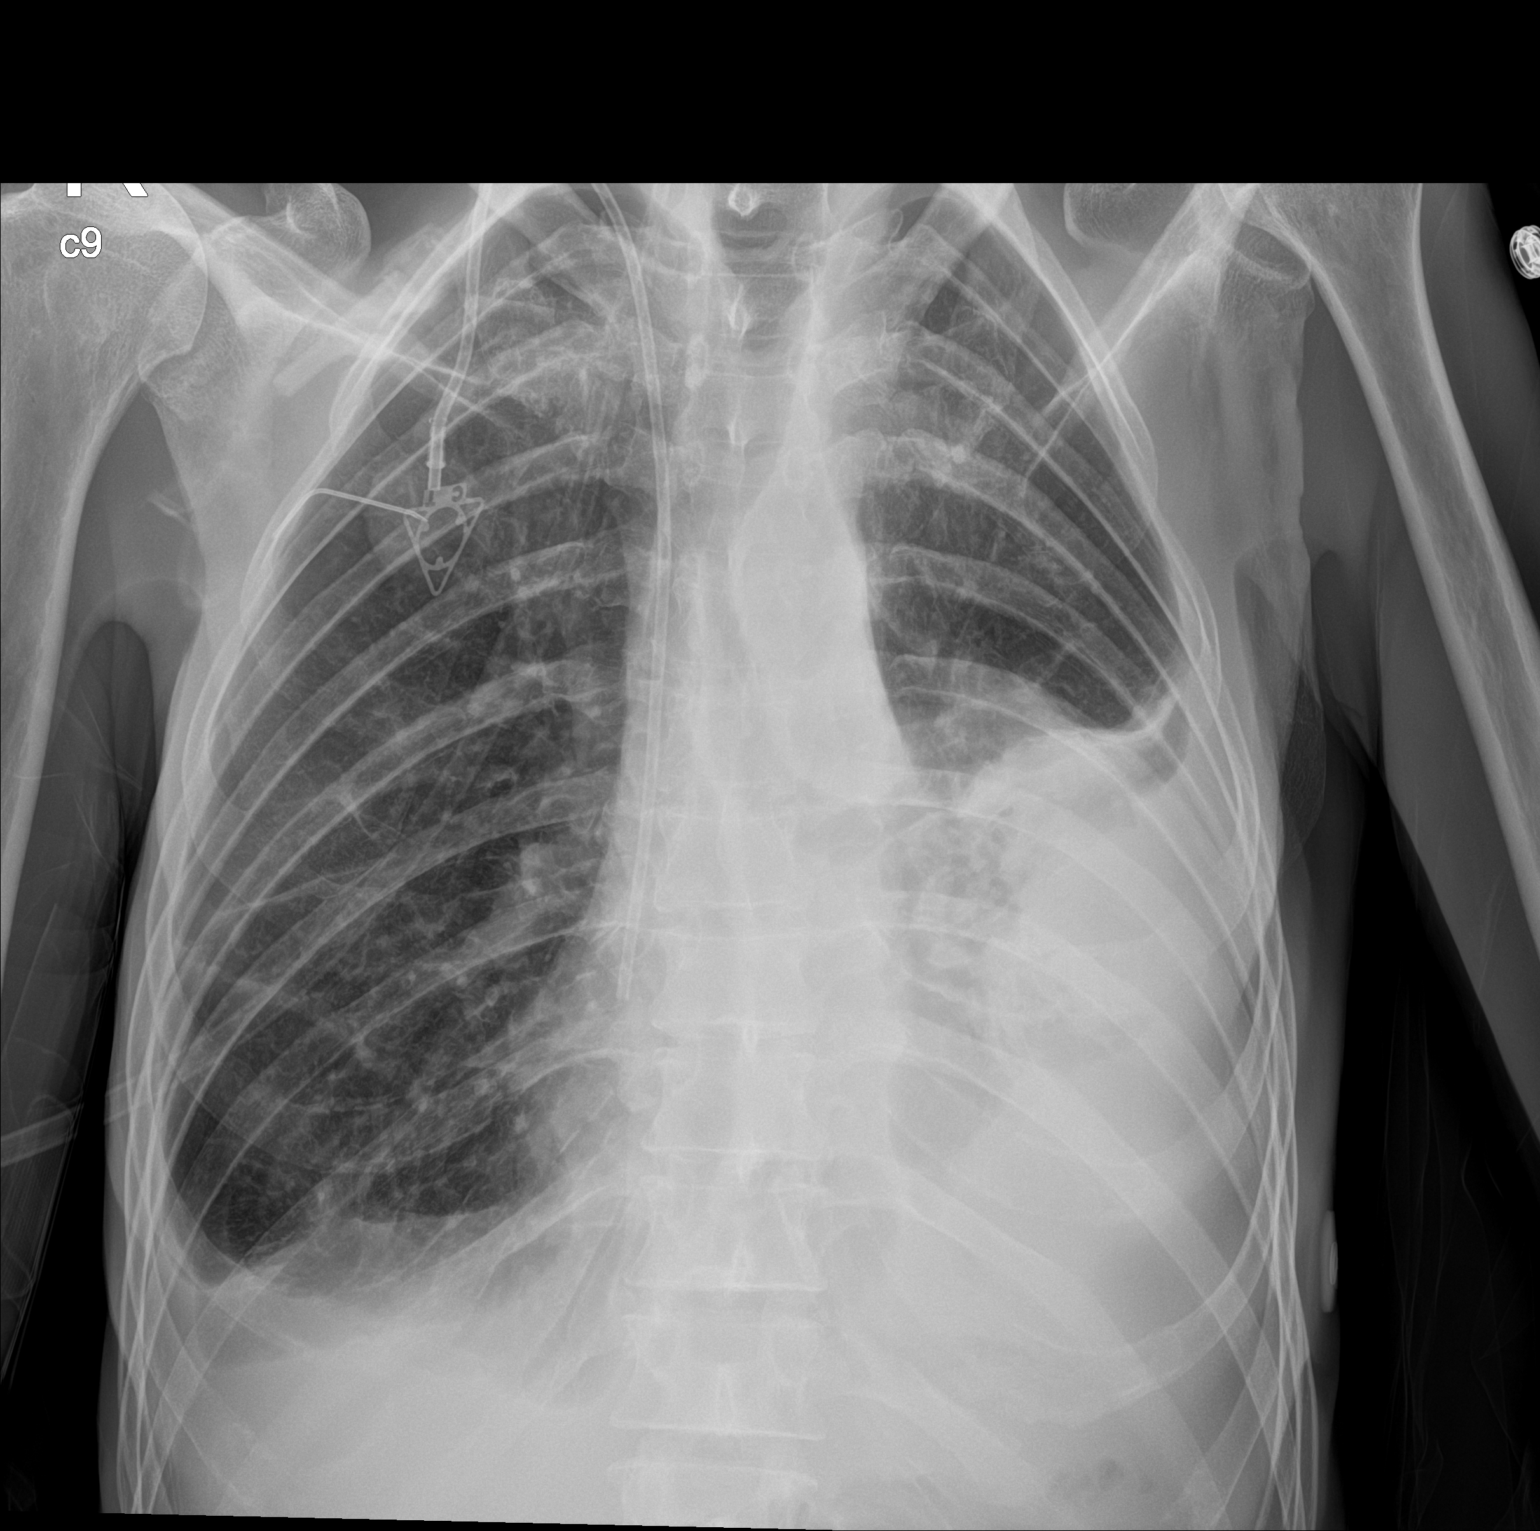

[1 of 1 positions shown; findings below may reference images not displayed]

FINDINGS: Right IJ power port tip is at the SVC RA junction. Large loculated
left pleural effusion, slightly decreased in size from 06/22/2019
after today's thoracentesis. No pneumothorax. Collapse/consolidation
in the lower left hemithorax. Small right pleural effusion with
right basilar atelectasis. Lungs are hyperinflated.
IMPRESSION: 1. Large residual loculated left pleural effusion, decreased in size
after thoracentesis earlier today. No pneumothorax.
2. Collapse/consolidation in the lower left hemithorax continued
follow-up to clearing is recommended as underlying malignancy cannot
be excluded.
3. Small right pleural effusion.
